# Patient Record
Sex: Male | Born: 1940 | Race: Black or African American | Hispanic: No | Marital: Single | Smoking: Former smoker
Health system: Southern US, Community
[De-identification: ages and names within clinical notes are randomized; demographics above are authoritative.]

## PROBLEM LIST (undated history)

## (undated) ENCOUNTER — Emergency Department (HOSPITAL_COMMUNITY): Admission: EM | Payer: Medicare Other

## (undated) DIAGNOSIS — F209 Schizophrenia, unspecified: Secondary | ICD-10-CM

## (undated) DIAGNOSIS — N400 Enlarged prostate without lower urinary tract symptoms: Secondary | ICD-10-CM

## (undated) DIAGNOSIS — B1920 Unspecified viral hepatitis C without hepatic coma: Secondary | ICD-10-CM

## (undated) DIAGNOSIS — N390 Urinary tract infection, site not specified: Secondary | ICD-10-CM

## (undated) HISTORY — PX: KNEE SURGERY: SHX244

---

## 2005-10-26 ENCOUNTER — Emergency Department (HOSPITAL_COMMUNITY): Admission: EM | Admit: 2005-10-26 | Discharge: 2005-10-26 | Payer: Self-pay | Admitting: Emergency Medicine

## 2006-08-14 ENCOUNTER — Emergency Department (HOSPITAL_COMMUNITY): Admission: EM | Admit: 2006-08-14 | Discharge: 2006-08-14 | Payer: Self-pay | Admitting: *Deleted

## 2006-08-26 ENCOUNTER — Emergency Department (HOSPITAL_COMMUNITY): Admission: EM | Admit: 2006-08-26 | Discharge: 2006-08-26 | Payer: Self-pay | Admitting: Emergency Medicine

## 2010-01-21 ENCOUNTER — Emergency Department (HOSPITAL_COMMUNITY)
Admission: EM | Admit: 2010-01-21 | Discharge: 2010-01-22 | Payer: Self-pay | Source: Home / Self Care | Admitting: Emergency Medicine

## 2010-01-22 ENCOUNTER — Inpatient Hospital Stay (HOSPITAL_COMMUNITY)
Admission: EM | Admit: 2010-01-22 | Discharge: 2010-01-26 | Payer: Self-pay | Source: Home / Self Care | Attending: Internal Medicine | Admitting: Internal Medicine

## 2010-02-01 ENCOUNTER — Observation Stay (HOSPITAL_COMMUNITY)
Admission: EM | Admit: 2010-02-01 | Discharge: 2010-02-06 | Payer: Self-pay | Source: Home / Self Care | Attending: Internal Medicine | Admitting: Internal Medicine

## 2010-02-02 DIAGNOSIS — F200 Paranoid schizophrenia: Secondary | ICD-10-CM

## 2010-02-03 DIAGNOSIS — F39 Unspecified mood [affective] disorder: Secondary | ICD-10-CM

## 2010-02-09 LAB — BASIC METABOLIC PANEL
BUN: 13 mg/dL (ref 6–23)
CO2: 27 mEq/L (ref 19–32)
Calcium: 8.8 mg/dL (ref 8.4–10.5)
Chloride: 109 mEq/L (ref 96–112)
Creatinine, Ser: 0.96 mg/dL (ref 0.4–1.5)
GFR calc Af Amer: >60 mL/min (ref >60)
GFR calc non Af Amer: >60 mL/min (ref >60)
Glucose, Bld: 89 mg/dL (ref 70–99)
Potassium: 4.1 mEq/L (ref 3.5–5.1)
Sodium: 142 mEq/L (ref 135–145)

## 2010-02-09 LAB — CBC
HCT: 37.1 % — ABNORMAL LOW (ref 39.0–52.0)
HCT: 37.6 % — ABNORMAL LOW (ref 39.0–52.0)
Hemoglobin: 12.2 g/dL — ABNORMAL LOW (ref 13.0–17.0)
Hemoglobin: 12.6 g/dL — ABNORMAL LOW (ref 13.0–17.0)
MCH: 30.3 pg (ref 26.0–34.0)
MCH: 30.7 pg (ref 26.0–34.0)
MCHC: 32.9 g/dL (ref 30.0–36.0)
MCHC: 33.5 g/dL (ref 30.0–36.0)
MCV: 92.3 fL (ref 78.0–100.0)
MCV: 91.5 fL (ref 78.0–100.0)
Platelets: 213 10*3/uL (ref 150–400)
Platelets: 216 10*3/uL (ref 150–400)
RBC: 4.02 MIL/uL — ABNORMAL LOW (ref 4.22–5.81)
RBC: 4.11 MIL/uL — ABNORMAL LOW (ref 4.22–5.81)
RDW: 13.7 % (ref 11.5–15.5)
RDW: 13.6 % (ref 11.5–15.5)
WBC: 7.2 10*3/uL (ref 4.0–10.5)
WBC: 6.1 10*3/uL (ref 4.0–10.5)

## 2010-02-09 LAB — DIFFERENTIAL
Basophils Absolute: 0 10*3/uL (ref 0.0–0.1)
Basophils Relative: 1 % (ref 0–1)
Eosinophils Absolute: 0.2 10*3/uL (ref 0.0–0.7)
Eosinophils Relative: 3 % (ref 0–5)
Lymphocytes Relative: 27 % (ref 12–46)
Lymphs Abs: 1.6 10*3/uL (ref 0.7–4.0)
Monocytes Absolute: 0.4 10*3/uL (ref 0.1–1.0)
Monocytes Relative: 7 % (ref 3–12)
Neutro Abs: 3.9 10*3/uL (ref 1.7–7.7)
Neutrophils Relative %: 64 % (ref 43–77)

## 2010-02-09 LAB — URINALYSIS, ROUTINE W REFLEX MICROSCOPIC
Bilirubin Urine: NEGATIVE
Bilirubin Urine: NEGATIVE
Ketones, ur: NEGATIVE mg/dL
Ketones, ur: NEGATIVE mg/dL
Nitrite: NEGATIVE
Nitrite: NEGATIVE
Protein, ur: 30 mg/dL — AB
Protein, ur: NEGATIVE mg/dL
Specific Gravity, Urine: 1.02 (ref 1.005–1.030)
Specific Gravity, Urine: 1.016 (ref 1.005–1.030)
Urine Glucose, Fasting: NEGATIVE mg/dL
Urine Glucose, Fasting: NEGATIVE mg/dL
Urobilinogen, UA: 0.2 mg/dL (ref 0.0–1.0)
Urobilinogen, UA: 0.2 mg/dL (ref 0.0–1.0)
pH: 7 (ref 5.0–8.0)
pH: 7.5 (ref 5.0–8.0)

## 2010-02-09 LAB — URINE MICROSCOPIC-ADD ON

## 2010-02-09 LAB — POCT I-STAT, CHEM 8
BUN: 13 mg/dL (ref 6–23)
Calcium, Ion: 1.18 mmol/L (ref 1.12–1.32)
Chloride: 103 mEq/L (ref 96–112)
Creatinine, Ser: 1.1 mg/dL (ref 0.4–1.5)
Glucose, Bld: 110 mg/dL — ABNORMAL HIGH (ref 70–99)
HCT: 40 % (ref 39.0–52.0)
Hemoglobin: 13.6 g/dL (ref 13.0–17.0)
Potassium: 3.7 mEq/L (ref 3.5–5.1)
Sodium: 140 mEq/L (ref 135–145)
TCO2: 28 mmol/L (ref 0–100)

## 2010-02-09 LAB — COMPREHENSIVE METABOLIC PANEL
ALT: 20 U/L (ref 0–53)
AST: 25 U/L (ref 0–37)
Albumin: 2.7 g/dL — ABNORMAL LOW (ref 3.5–5.2)
Alkaline Phosphatase: 65 U/L (ref 39–117)
BUN: 10 mg/dL (ref 6–23)
CO2: 27 mEq/L (ref 19–32)
Calcium: 8.7 mg/dL (ref 8.4–10.5)
Chloride: 110 mEq/L (ref 96–112)
Creatinine, Ser: 0.91 mg/dL (ref 0.4–1.5)
GFR calc Af Amer: >60 mL/min (ref >60)
GFR calc non Af Amer: >60 mL/min (ref >60)
Glucose, Bld: 95 mg/dL (ref 70–99)
Potassium: 4 mEq/L (ref 3.5–5.1)
Sodium: 142 mEq/L (ref 135–145)
Total Bilirubin: 0.8 mg/dL (ref 0.3–1.2)
Total Protein: 6 g/dL (ref 6.0–8.3)

## 2010-02-09 LAB — FREE PSA
PSA, Free Pct: 15 % — ABNORMAL LOW (ref >25)
PSA, Free: 0.5 ng/mL

## 2010-02-09 LAB — URINE CULTURE
Colony Count: NO GROWTH
Culture  Setup Time: 201201091229
Culture: NO GROWTH

## 2010-02-09 LAB — PSA: PSA: 3.34 ng/mL (ref <=4.00)

## 2010-02-18 ENCOUNTER — Emergency Department (HOSPITAL_COMMUNITY)
Admission: EM | Admit: 2010-02-18 | Discharge: 2010-02-18 | Payer: Self-pay | Source: Home / Self Care | Admitting: Emergency Medicine

## 2010-02-18 LAB — URINALYSIS, ROUTINE W REFLEX MICROSCOPIC
Ketones, ur: NEGATIVE mg/dL
Nitrite: NEGATIVE
Protein, ur: >300 mg/dL — AB
Specific Gravity, Urine: 1.026 (ref 1.005–1.030)
Urine Glucose, Fasting: NEGATIVE mg/dL
Urobilinogen, UA: 1 mg/dL (ref 0.0–1.0)
pH: 6.5 (ref 5.0–8.0)

## 2010-02-18 LAB — URINE MICROSCOPIC-ADD ON

## 2010-02-18 LAB — POCT I-STAT, CHEM 8
BUN: 10 mg/dL (ref 6–23)
Calcium, Ion: 1.21 mmol/L (ref 1.12–1.32)
Chloride: 108 mEq/L (ref 96–112)
Creatinine, Ser: 1.2 mg/dL (ref 0.4–1.5)
Glucose, Bld: 100 mg/dL — ABNORMAL HIGH (ref 70–99)
HCT: 43 % (ref 39.0–52.0)
Hemoglobin: 14.6 g/dL (ref 13.0–17.0)
Potassium: 3.7 mEq/L (ref 3.5–5.1)
Sodium: 145 mEq/L (ref 135–145)
TCO2: 26 mmol/L (ref 0–100)

## 2010-02-19 NOTE — H&P (Addendum)
[name], [name]                ACCOUNT NO.:  [id number]  MEDICAL RECORD NO.:  [id number]          PATIENT TYPE:  INP  LOCATION:  1601                         FACILITY:  [facility]  PHYSICIAN:  Adrian I Eda Paschal, MD      DATE OF BIRTH:  1940/11/12  DATE OF ADMISSION:  02/01/2010 DATE OF DISCHARGE:                             HISTORY & PHYSICAL   PRIMARY CARE PHYSICIAN:  Unassigned.  CHIEF COMPLAINT:  Difficulty urination after removing the Foley catheter since Friday.  HISTORY OF PRESENT ILLNESS:  This is a 70 year old gentleman with a history of schizoaffective disorder with acute psychosis.  He was recently discharged from [facility] on January 2 after diagnosis of schizoaffective disorder with acute psychosis and acute renal insufficiency, felt to be secondary to bladder outlet obstruction, is status post placement of Foley catheter.  The patient was advised to follow up with [facility] for further possible voiding trial or TURP procedure.  The patient was discharged after psychiatric evaluation and stabilization of his psych issue.  The patient yesterday after he felt it would be okay if he removed the Foley and he asked family member to remove it and the Foley catheter removed on Friday and since then he had no bowel movement, had no urine output.  He presented with lower abdominal pain and when Foley catheter is placed, he had almost 3000 cc of urine outpatient.  The patient currently is admitted.  He is under depression and psychological instability, but he denies any suicidal ideation or suicidal attempt.  PAST MEDICAL HISTORY:  Significant for, 1. Schizoaffective disorder with acute psychosis. 2. Schizophrenia. 3. Hepatitis C. 4. History of urinary retention.  PAST SURGICAL HISTORY:  History of right knee surgery and as we mentioned, the patient is really a poor historian and he keeps talking, has significant flight of ideas.  SOCIAL HISTORY:  The  patient is unemployed, as he said he lives with his sister.  He denies any smoking or drinking and denies any drug abuse.  FAMILY HISTORY:  Noncontributory.  ALLERGIES:  No known drug allergies.  MEDICATIONS: 1. Terazosin 1 mg p.o. daily. 2. Haloperidol 5 mg p.o. daily. 3. Depakote 1000 mg p.o. at bedtime. 4. Cipro 500 mg p.o. twice daily.  REVIEW OF SYSTEMS:  As per discussion of the patient, negative for all 10-organ system.  PHYSICAL EXAMINATION:  VITAL SIGNS:  Temperature 98.2, blood pressure 180/97, pulse rate 74, respiratory rate 26, and saturating 99% on room air. GENERAL:  He obviously has some flight of ideas and instability. HEENT:  Normocephalic, atraumatic.  Pupils equal, reactive to light and accommodation.  Oral cavity, poor oral cavity hygiene, but there is no oral thrush.  No lymph node.  No masses.  No thyromegaly. HEART:  S1 and S2.  No gallop or rales. LUNGS:  Normal vesicular breathing with equal air entry. ABDOMEN:  Soft, nontender.  Bowel sound positive.  Foley catheter in place with more than 1000 cc of clear urine. EXTREMITIES:  No cyanosis, no clubbing or edema. SKIN:  No rashes. NEUROLOGIC:  Nonfocal neurological deficit.  Urine microscopy, RBCs too numerous  to count.  Urinalysis, large blood and negative leukocyte esterase.  Hemoglobin 13.6, hematocrit 40. Sodium 140, potassium 3.7, chloride 103, glucose 110, BUN 13, and creatinine 1.1.  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  We will continue with placing the Foley catheter and hydrating.  We will get prostate-specific antigen.  We will ask both Urology and Psychiatry to evaluate.  The patient may need TURP and needs further psych  stabilization.  We will continue with Cipro p.o. for covering against urinary tract infection.  We will repeat urinalysis and culture.  He has microscopic hematuria, which we felt could be secondary to removing the Foley where this underlying infection.  We may need to have cystoscopy and TURP procedure.  We will continue with home medication for psych stabilization.  Deep venous thrombosis and gastrointestinal prophylaxis.  Further recommendation as hospital course progresses.     Adrian Bosie Helper, MD     HIE/MEDQ  D:  02/01/2010  T:  02/02/2010  Job:  [id number]  Electronically Signed by Ebony Cargo MD on 02/19/2010 12:46:05 PM

## 2010-02-20 LAB — URINE CULTURE
Colony Count: >=100000
Culture  Setup Time: 201201251325

## 2010-02-25 NOTE — H&P (Signed)
NAME:  [name], [name] NO.:  [id number]  MEDICAL RECORD NO.:  [id number]          PATIENT TYPE:  EMS  LOCATION:  MAJO                         FACILITY:  [facility]  PHYSICIAN:  Massie Maroon, MD        DATE OF BIRTH:  [birth year]  DATE OF ADMISSION:  01/22/2010 DATE OF DISCHARGE:                             HISTORY & PHYSICAL   CHIEF COMPLAINT:  Has had difficulty urinating.  HISTORY OF PRESENT ILLNESS:  A 70 year old male complains of urinary retention.  He was apparently seen yesterday in the ED and a Foley was placed and he has a leg bag.  Patient thinks that it is causing him pain.  He admits slight dysuria, but denies any hematuria, flank pain, fever, chills.  Patient is living at [facility] of the [employer] and is unemployed.  He is essentially homeless, though sometimes he lives with his family.  ED physician thought that the patient had poor followup and should be admitted for urinary retention and evaluation and then transferred to Psychiatry for further evaluation of the schizophrenia.  PAST MEDICAL HISTORY: 1. Schizophrenia. 2. Hepatitis C. 3. Urinary retention.  PAST SURGICAL HISTORY:  Right knee or leg surgery?  The patient is a poor historian and cannot recall what exactly the surgery was.  SOCIAL HISTORY:  The patient is unemployed.  He lives at [employer] presently of the [employer].  He denies smoking or drinking at the present time and denies any drug use.  FAMILY HISTORY:  Mother is deceased, but he is not sure from what his father died at age 39, if heart attack and was a smoker.  There is no family history of schizophrenia per the patient or any other psychiatric issues.  ALLERGIES:  No known drug allergies.  MEDICATIONS:  None.  REVIEW OF SYSTEMS:  Negative for all 10 organ systems except for pertinent positive as stated above.  PHYSICAL EXAMINATION:  VITAL SIGNS: Temperature 98.9, pulse 99, blood pressure 183/94, pulse  ox is 100% on room air.  HEENT: Anicteric.  NECK: No JVD, no bruit, no thyromegaly, no adenopathy.  HEART: Regular rate and rhythm.  S1, S2.  No murmurs, gallops, or rubs.  LUNGS: Clear to auscultation bilaterally.  ABDOMEN: Soft, nontender, nondistended. Positive bowel sounds.  EXTREMITIES: No cyanosis, clubbing, or edema. SKIN: No rashes.  LYMPH NODES: No adenopathy.  NEURO: Nonfocal, onychomycosis.  LABORATORY DATA:  Urine drug screen negative.  Urinalysis, WBC 7-10, RBCs too numerous to count.  Alcohol level less than 5.  Sodium 136, potassium 3.8, BUN 15, creatinine 1.42, AST 29, ALT 17, alk phos 74, total bilirubin 1.5.  WBC 12.4, hemoglobin 14.0, platelet count 187.  CT scan of the abdomen and pelvis shows distended urinary bladder with multiple bladder calculi consistent with chronic bladder outlet obstruction, likely by an enlarged prostate gland.  There is mild bilateral hydronephrosis and hydroureter likely due to distended bladder, cholelithiasis, and incidental right ileus psoas lipoma.  ASSESSMENT/PLAN:1. Urinary retention:  Patient will be started on terazosin 1 mg p.o.     at bedtime.  Check a PSA. 2.  Consider urology consult in the morning. 3. Urinary tract infection:  Ceftriaxone.  Actually use Cipro 400 mg     IV b.i.d. 4. Hypertension, uncontrolled.  Carvedilol 3.125 mg p.o. b.i.d. 5. Schizophrenia:  Psychiatry consult in the a.m. 6. DVT prophylaxis, SCDs.     Massie Maroon, MD     JYK/MEDQ  D:  01/23/2010  T:  01/23/2010  Job:  [id number]  Electronically Signed by Pearson Grippe MD on 02/25/2010 11:38:43 AM

## 2010-04-06 LAB — RAPID URINE DRUG SCREEN, HOSP PERFORMED
Amphetamines: NOT DETECTED
Barbiturates: NOT DETECTED
Benzodiazepines: NOT DETECTED
Cocaine: NOT DETECTED
Opiates: NOT DETECTED
Tetrahydrocannabinol: NOT DETECTED

## 2010-04-06 LAB — CBC
HCT: 36.5 % — ABNORMAL LOW (ref 39.0–52.0)
HCT: 39.6 % (ref 39.0–52.0)
HCT: 41.3 % (ref 39.0–52.0)
Hemoglobin: 12.6 g/dL — ABNORMAL LOW (ref 13.0–17.0)
Hemoglobin: 14 g/dL (ref 13.0–17.0)
Hemoglobin: 14.8 g/dL (ref 13.0–17.0)
MCH: 30.3 pg (ref 26.0–34.0)
MCH: 30.6 pg (ref 26.0–34.0)
MCH: 31 pg (ref 26.0–34.0)
MCHC: 34.5 g/dL (ref 30.0–36.0)
MCHC: 35.4 g/dL (ref 30.0–36.0)
MCHC: 35.8 g/dL (ref 30.0–36.0)
MCV: 86.5 fL (ref 78.0–100.0)
MCV: 86.6 fL (ref 78.0–100.0)
Platelets: 187 10*3/uL (ref 150–400)
Platelets: 195 10*3/uL (ref 150–400)
RBC: 4.16 MIL/uL — ABNORMAL LOW (ref 4.22–5.81)
RBC: 4.58 MIL/uL (ref 4.22–5.81)
RBC: 4.77 MIL/uL (ref 4.22–5.81)
RDW: 12.4 % (ref 11.5–15.5)
RDW: 12.7 % (ref 11.5–15.5)
WBC: 12.4 10*3/uL — ABNORMAL HIGH (ref 4.0–10.5)
WBC: 8 10*3/uL (ref 4.0–10.5)

## 2010-04-06 LAB — URINALYSIS, ROUTINE W REFLEX MICROSCOPIC
Bilirubin Urine: NEGATIVE
Bilirubin Urine: NEGATIVE
Glucose, UA: NEGATIVE mg/dL
Glucose, UA: NEGATIVE mg/dL
Glucose, UA: NEGATIVE mg/dL
Ketones, ur: NEGATIVE mg/dL
Ketones, ur: NEGATIVE mg/dL
Ketones, ur: NEGATIVE mg/dL
Leukocytes, UA: NEGATIVE
Nitrite: NEGATIVE
Nitrite: NEGATIVE
Protein, ur: 100 mg/dL — AB
Protein, ur: 30 mg/dL — AB
Protein, ur: 30 mg/dL — AB
Specific Gravity, Urine: 1.009 (ref 1.005–1.030)
Specific Gravity, Urine: 1.015 (ref 1.005–1.030)
Urobilinogen, UA: 0.2 mg/dL (ref 0.0–1.0)
Urobilinogen, UA: 0.2 mg/dL (ref 0.0–1.0)
pH: 5.5 (ref 5.0–8.0)
pH: 5.5 (ref 5.0–8.0)
pH: 5.5 (ref 5.0–8.0)

## 2010-04-06 LAB — BASIC METABOLIC PANEL
CO2: 21 mEq/L (ref 19–32)
CO2: 26 mEq/L (ref 19–32)
Calcium: 8.7 mg/dL (ref 8.4–10.5)
Chloride: 110 mEq/L (ref 96–112)
Chloride: 115 mEq/L — ABNORMAL HIGH (ref 96–112)
GFR calc Af Amer: 42 mL/min — ABNORMAL LOW (ref >60)
GFR calc Af Amer: >60 mL/min (ref >60)
Glucose, Bld: 105 mg/dL — ABNORMAL HIGH (ref 70–99)
Glucose, Bld: 99 mg/dL (ref 70–99)
Potassium: 4 mEq/L (ref 3.5–5.1)
Sodium: 141 mEq/L (ref 135–145)
Sodium: 142 mEq/L (ref 135–145)

## 2010-04-06 LAB — DIFFERENTIAL
Basophils Absolute: 0 10*3/uL (ref 0.0–0.1)
Basophils Absolute: 0 K/uL (ref 0.0–0.1)
Basophils Relative: 0 % (ref 0–1)
Basophils Relative: 0 % (ref 0–1)
Eosinophils Absolute: 0 10*3/uL (ref 0.0–0.7)
Eosinophils Absolute: 0 K/uL (ref 0.0–0.7)
Eosinophils Relative: 0 % (ref 0–5)
Eosinophils Relative: 0 % (ref 0–5)
Lymphocytes Relative: 11 % — ABNORMAL LOW (ref 12–46)
Lymphocytes Relative: 7 % — ABNORMAL LOW (ref 12–46)
Lymphs Abs: 0.9 10*3/uL (ref 0.7–4.0)
Lymphs Abs: 0.9 10*3/uL (ref 0.7–4.0)
Monocytes Absolute: 0.4 10*3/uL (ref 0.1–1.0)
Monocytes Absolute: 1 10*3/uL (ref 0.1–1.0)
Monocytes Relative: 5 % (ref 3–12)
Monocytes Relative: 8 % (ref 3–12)
Neutro Abs: 10.5 10*3/uL — ABNORMAL HIGH (ref 1.7–7.7)
Neutro Abs: 6.7 10*3/uL (ref 1.7–7.7)
Neutrophils Relative %: 84 % — ABNORMAL HIGH (ref 43–77)
Neutrophils Relative %: 84 % — ABNORMAL HIGH (ref 43–77)

## 2010-04-06 LAB — COMPREHENSIVE METABOLIC PANEL WITH GFR
ALT: 17 U/L (ref 0–53)
ALT: 20 U/L (ref 0–53)
AST: 29 U/L (ref 0–37)
AST: 31 U/L (ref 0–37)
Albumin: 3.8 g/dL (ref 3.5–5.2)
Alkaline Phosphatase: 61 U/L (ref 39–117)
BUN: 13 mg/dL (ref 6–23)
CO2: 21 meq/L (ref 19–32)
CO2: 21 meq/L (ref 19–32)
Calcium: 9.4 mg/dL (ref 8.4–10.5)
Chloride: 103 meq/L (ref 96–112)
Chloride: 108 meq/L (ref 96–112)
Creatinine, Ser: 1.1 mg/dL (ref 0.4–1.5)
Creatinine, Ser: 1.42 mg/dL (ref 0.4–1.5)
GFR calc Af Amer: >60 mL/min (ref >60)
GFR calc non Af Amer: 50 mL/min — ABNORMAL LOW (ref >60)
GFR calc non Af Amer: >60 mL/min
Glucose, Bld: 109 mg/dL — ABNORMAL HIGH (ref 70–99)
Potassium: 3.7 meq/L (ref 3.5–5.1)
Sodium: 134 meq/L — ABNORMAL LOW (ref 135–145)
Sodium: 136 meq/L (ref 135–145)
Total Bilirubin: 1.5 mg/dL — ABNORMAL HIGH (ref 0.3–1.2)
Total Bilirubin: 2.3 mg/dL — ABNORMAL HIGH (ref 0.3–1.2)
Total Protein: 7.2 g/dL (ref 6.0–8.3)

## 2010-04-06 LAB — URINE MICROSCOPIC-ADD ON

## 2010-04-06 LAB — URINE CULTURE
Colony Count: NO GROWTH
Culture  Setup Time: 201112292230
Culture  Setup Time: 201112311804
Culture: NO GROWTH

## 2010-04-06 LAB — COMPREHENSIVE METABOLIC PANEL
Albumin: 3.8 g/dL (ref 3.5–5.2)
Alkaline Phosphatase: 74 U/L (ref 39–117)
BUN: 15 mg/dL (ref 6–23)
Calcium: 9.2 mg/dL (ref 8.4–10.5)
Glucose, Bld: 144 mg/dL — ABNORMAL HIGH (ref 70–99)
Potassium: 3.8 mEq/L (ref 3.5–5.1)
Total Protein: 6.6 g/dL (ref 6.0–8.3)

## 2010-04-06 LAB — LIPASE, BLOOD: Lipase: 16 U/L (ref 11–59)

## 2010-04-06 LAB — ETHANOL
Alcohol, Ethyl (B): <5 mg/dL (ref 0–10)
Alcohol, Ethyl (B): <5 mg/dL (ref 0–10)

## 2010-04-06 LAB — PSA: PSA: 10.15 ng/mL — ABNORMAL HIGH (ref <=4.00)

## 2010-06-10 ENCOUNTER — Emergency Department (HOSPITAL_COMMUNITY)
Admission: EM | Admit: 2010-06-10 | Discharge: 2010-06-11 | Disposition: A | Discharge status: Home / Self Care | Payer: Medicare Other | Attending: Emergency Medicine | Admitting: Emergency Medicine

## 2010-06-10 DIAGNOSIS — N400 Enlarged prostate without lower urinary tract symptoms: Secondary | ICD-10-CM | POA: Insufficient documentation

## 2010-06-10 DIAGNOSIS — Z8659 Personal history of other mental and behavioral disorders: Secondary | ICD-10-CM | POA: Insufficient documentation

## 2010-06-10 DIAGNOSIS — R300 Dysuria: Secondary | ICD-10-CM | POA: Insufficient documentation

## 2010-06-10 DIAGNOSIS — R350 Frequency of micturition: Secondary | ICD-10-CM | POA: Insufficient documentation

## 2010-06-10 DIAGNOSIS — N390 Urinary tract infection, site not specified: Secondary | ICD-10-CM | POA: Insufficient documentation

## 2010-06-10 DIAGNOSIS — R3915 Urgency of urination: Secondary | ICD-10-CM | POA: Insufficient documentation

## 2010-06-10 DIAGNOSIS — R197 Diarrhea, unspecified: Secondary | ICD-10-CM | POA: Insufficient documentation

## 2010-06-10 DIAGNOSIS — Z8619 Personal history of other infectious and parasitic diseases: Secondary | ICD-10-CM | POA: Insufficient documentation

## 2010-06-10 DIAGNOSIS — N419 Inflammatory disease of prostate, unspecified: Secondary | ICD-10-CM | POA: Insufficient documentation

## 2010-06-10 LAB — URINALYSIS, ROUTINE W REFLEX MICROSCOPIC
Bilirubin Urine: NEGATIVE
Glucose, UA: NEGATIVE mg/dL
Specific Gravity, Urine: 1.024 (ref 1.005–1.030)

## 2010-06-10 LAB — URINE MICROSCOPIC-ADD ON

## 2010-11-09 LAB — URINALYSIS, ROUTINE W REFLEX MICROSCOPIC
Ketones, ur: NEGATIVE
Leukocytes, UA: NEGATIVE
Nitrite: NEGATIVE
Protein, ur: 30 — AB
Urobilinogen, UA: 1

## 2010-11-09 LAB — GC/CHLAMYDIA PROBE AMP, GENITAL: GC Probe Amp, Genital: NEGATIVE

## 2011-06-09 ENCOUNTER — Encounter (HOSPITAL_COMMUNITY): Payer: Self-pay | Admitting: *Deleted

## 2011-06-09 ENCOUNTER — Emergency Department (HOSPITAL_COMMUNITY)
Admission: EM | Admit: 2011-06-09 | Discharge: 2011-06-11 | Disposition: A | Discharge status: Other Acute Inpatient Hospital | Payer: Medicare Other | Source: Home / Self Care | Attending: Emergency Medicine | Admitting: Emergency Medicine

## 2011-06-09 DIAGNOSIS — N390 Urinary tract infection, site not specified: Secondary | ICD-10-CM

## 2011-06-09 DIAGNOSIS — F209 Schizophrenia, unspecified: Secondary | ICD-10-CM | POA: Insufficient documentation

## 2011-06-09 DIAGNOSIS — R300 Dysuria: Secondary | ICD-10-CM | POA: Insufficient documentation

## 2011-06-09 DIAGNOSIS — F309 Manic episode, unspecified: Secondary | ICD-10-CM | POA: Insufficient documentation

## 2011-06-09 DIAGNOSIS — R45851 Suicidal ideations: Secondary | ICD-10-CM | POA: Insufficient documentation

## 2011-06-09 DIAGNOSIS — B1920 Unspecified viral hepatitis C without hepatic coma: Secondary | ICD-10-CM | POA: Insufficient documentation

## 2011-06-09 DIAGNOSIS — F29 Unspecified psychosis not due to a substance or known physiological condition: Secondary | ICD-10-CM | POA: Insufficient documentation

## 2011-06-09 HISTORY — DX: Unspecified viral hepatitis C without hepatic coma: B19.20

## 2011-06-09 HISTORY — DX: Benign prostatic hyperplasia without lower urinary tract symptoms: N40.0

## 2011-06-09 HISTORY — DX: Schizophrenia, unspecified: F20.9

## 2011-06-09 LAB — CBC
MCV: 88.2 fL (ref 78.0–100.0)
Platelets: 218 10*3/uL (ref 150–400)
RBC: 5.01 MIL/uL (ref 4.22–5.81)
WBC: 7 10*3/uL (ref 4.0–10.5)

## 2011-06-09 LAB — URINALYSIS, ROUTINE W REFLEX MICROSCOPIC
Glucose, UA: NEGATIVE mg/dL
Protein, ur: NEGATIVE mg/dL
Specific Gravity, Urine: 1.022 (ref 1.005–1.030)
pH: 8 (ref 5.0–8.0)

## 2011-06-09 LAB — BASIC METABOLIC PANEL
CO2: 24 mEq/L (ref 19–32)
Calcium: 9.7 mg/dL (ref 8.4–10.5)
GFR calc non Af Amer: 87 mL/min — ABNORMAL LOW (ref >90)
Sodium: 140 mEq/L (ref 135–145)

## 2011-06-09 LAB — URINE MICROSCOPIC-ADD ON

## 2011-06-09 LAB — RAPID URINE DRUG SCREEN, HOSP PERFORMED
Opiates: NOT DETECTED
Tetrahydrocannabinol: NOT DETECTED

## 2011-06-09 MED ORDER — IBUPROFEN 200 MG PO TABS: 400.0000 mg | ORAL_TABLET | Freq: Three times a day (TID) | ORAL | Status: DC | PRN

## 2011-06-09 MED ORDER — ALUM & MAG HYDROXIDE-SIMETH 200-200-20 MG/5ML PO SUSP: 30.0000 mL | ORAL | Status: DC | PRN

## 2011-06-09 MED ORDER — LORAZEPAM 1 MG PO TABS
2.0000 mg | ORAL_TABLET | Freq: Once | ORAL | Status: AC
  Administered 2011-06-09: 2 mg via ORAL
  Filled 2011-06-09: qty 2

## 2011-06-09 MED ORDER — ZOLPIDEM TARTRATE 5 MG PO TABS: 5.0000 mg | ORAL_TABLET | Freq: Every evening | ORAL | Status: DC | PRN

## 2011-06-09 MED ORDER — ONDANSETRON HCL 8 MG PO TABS: 4.0000 mg | ORAL_TABLET | Freq: Three times a day (TID) | ORAL | Status: DC | PRN

## 2011-06-09 MED ORDER — ACETAMINOPHEN 325 MG PO TABS: 650.0000 mg | ORAL_TABLET | ORAL | Status: DC | PRN

## 2011-06-09 MED ORDER — LORAZEPAM 1 MG PO TABS: 1.0000 mg | ORAL_TABLET | Freq: Three times a day (TID) | ORAL | Status: DC | PRN

## 2011-06-09 MED ORDER — NICOTINE 21 MG/24HR TD PT24: 21.0000 mg | MEDICATED_PATCH | Freq: Every day | TRANSDERMAL | Status: DC | PRN

## 2011-06-09 MED ORDER — CIPROFLOXACIN HCL 500 MG PO TABS
500.0000 mg | ORAL_TABLET | Freq: Two times a day (BID) | ORAL | Status: DC
  Administered 2011-06-09 – 2011-06-11 (×3): 500 mg via ORAL
  Filled 2011-06-09 (×4): qty 1

## 2011-06-09 NOTE — ED Notes (Signed)
Telepsych called to say Pt. Needs to be admitted and orders for him will be faxed.

## 2011-06-09 NOTE — ED Notes (Signed)
Dr. Fonnie Jarvis writing IVC papers. Per [facility] pt is in need of inpatient psychiatric services and pt is refusing to stay. Security at bedside and sitter at bedside.

## 2011-06-09 NOTE — ED Notes (Signed)
Received faxed from tele psych given to EDP.

## 2011-06-09 NOTE — ED Notes (Signed)
Pt changed, wanded and belongings locked in locker 9. One pair of boots, sweatshirt and jeans. 23.69$ and2 credit cards

## 2011-06-09 NOTE — ED Notes (Signed)
Telepsych initiated. Paperwork faxed. Initial call made.

## 2011-06-09 NOTE — ED Notes (Signed)
Pt refuses to change, states "I want a legal representative, I want to go before the magistrate"

## 2011-06-09 NOTE — ED Notes (Signed)
Tele psych called and now speaking with patient via video

## 2011-06-09 NOTE — ED Notes (Signed)
Security at bedside to change patient

## 2011-06-09 NOTE — ED Provider Notes (Signed)
History     CSN: [id number]  Arrival date & time 06/09/11  4098   First MD Initiated Contact with Patient 06/09/11 1053      Chief Complaint  Patient presents with  . painful urination     The history is provided by the patient. History Limited By: Hx mental illness.  Pt was seen at 1035.  Per pt, c/o "burning in my penis when I urinate" for the past year.  States "it only hurts when I'm in [place]."  Reports his symptoms improved when he "drank energy drinks and moved to [place]."          Past Medical History  Diagnosis Date  . Schizophrenia   . Hepatitis C   . Benign hypertrophy of prostate     Past Surgical History  Procedure Date  . Knee surgery     right     History  Substance Use Topics  . Smoking status: Former Games developer  . Smokeless tobacco: Not on file  . Alcohol Use: No    Review of Systems  Unable to perform ROS: Psychiatric disorder    Allergies  Review of patient's allergies indicates no known allergies.  Home Medications  No current outpatient prescriptions on file.  BP 150/90  Pulse 93  Temp(Src) 98.1 F (36.7 C) (Oral)  Resp 20  SpO2 98%  Physical Exam 1040: Physical examination:  Nursing notes reviewed; Vital signs and O2 SAT reviewed;  Constitutional: Well developed, Well nourished, Well hydrated, In no acute distress; Head:  Normocephalic, atraumatic; Eyes: EOMI, No scleral icterus; ENMT: No hoarse voice, no stridor. Mucous membranes moist; Neck: Supple, Full range of motion, No lymphadenopathy; Cardiovascular: Regular rate and rhythm; Respiratory: Breath sounds clear bilaterally, resps easy. Normal respiratory effort/excursion; Chest: Movement normal; Extremities: No deformity, No edema; Neuro: Awake, alert, speech clear, no facial droop, gait steady, climbs on and off stretcher without difficulty.; Skin: Color normal, Warm, Dry; Psych:  Easily agitated, pressured speech, flight of ideas, tangential.   ED Course  Procedures    1045:  After pt relates above, he then begins to talk about "Shella Maxim" "they killed him" and "some people have the ability to themselves better than the other people" (pt stands up and bends forward from the waist touching his toes and stays in this position when he states this, then stands up and says, "right?" and "ok").  Pt easily agitated.  Offered ativan PO; pt will "think about it."   EPIC chart review does show pt was admitted on medical service in 12/2009 and 01/2010 for urinary retention but also had flight of ideas/psychosis/mania; psych MD consults at that time gave meds recommendations with improvement in mental health symptoms and pt was discharged from the hospital after improvement in his urinary symptoms.  No apparent SI or HI today but appears manic, psychotic, easily agitated, having flight of ideas and does not appear to be taking his meds, may need psych consult/admit for stabilization.   1100:  T/C from Uro Dr. Sherron Monday:  States he was informed pt was in the ED, knows pt well, requests to tx with abx if has UTI, states he does NOT need to see the pt in consult for a simple UTI before/if pt needs to be admitted to psych.    1350:  T/C to [facility], they will eval in ED.  Holding orders written, including PO abx for +UTI (UC pending).  Pt moved to yellow.   MDM  MDM Reviewed: previous chart, nursing note  and vitals Interpretation: labs   Results for orders placed during the hospital encounter of 06/09/11  URINALYSIS, ROUTINE W REFLEX MICROSCOPIC      Component Value Range   Color, Urine YELLOW  YELLOW    APPearance CLEAR  CLEAR    Specific Gravity, Urine 1.022  1.005 - 1.030    pH 8.0  5.0 - 8.0    Glucose, UA NEGATIVE  NEGATIVE (mg/dL)   Hgb urine dipstick NEGATIVE  NEGATIVE    Bilirubin Urine NEGATIVE  NEGATIVE    Ketones, ur NEGATIVE  NEGATIVE (mg/dL)   Protein, ur NEGATIVE  NEGATIVE (mg/dL)   Urobilinogen, UA 1.0  0.0 - 1.0 (mg/dL)   Nitrite NEGATIVE  NEGATIVE     Leukocytes, UA SMALL (*) NEGATIVE   BASIC METABOLIC PANEL      Component Value Range   Sodium 140  135 - 145 (mEq/L)   Potassium 4.7  3.5 - 5.1 (mEq/L)   Chloride 108  96 - 112 (mEq/L)   CO2 24  19 - 32 (mEq/L)   Glucose, Bld 102 (*) 70 - 99 (mg/dL)   BUN 17  6 - 23 (mg/dL)   Creatinine, Ser 4.09  0.50 - 1.35 (mg/dL)   Calcium 9.7  8.4 - 81.1 (mg/dL)   GFR calc non Af Amer 87 (*) >90 (mL/min)   GFR calc Af Amer >90  >90 (mL/min)  CBC      Component Value Range   WBC 7.0  4.0 - 10.5 (K/uL)   RBC 5.01  4.22 - 5.81 (MIL/uL)   Hemoglobin 15.5  13.0 - 17.0 (g/dL)   HCT 91.4  78.2 - 95.6 (%)   MCV 88.2  78.0 - 100.0 (fL)   MCH 30.9  26.0 - 34.0 (pg)   MCHC 35.1  30.0 - 36.0 (g/dL)   RDW 21.3  08.6 - 57.8 (%)   Platelets 218  150 - 400 (K/uL)  URINE RAPID DRUG SCREEN (HOSP PERFORMED)      Component Value Range   Opiates NONE DETECTED  NONE DETECTED    Cocaine NONE DETECTED  NONE DETECTED    Benzodiazepines NONE DETECTED  NONE DETECTED    Amphetamines NONE DETECTED  NONE DETECTED    Tetrahydrocannabinol NONE DETECTED  NONE DETECTED    Barbiturates NONE DETECTED  NONE DETECTED   ETHANOL      Component Value Range   Alcohol, Ethyl (B) <11  0 - 11 (mg/dL)  URINE MICROSCOPIC-ADD ON      Component Value Range   Squamous Epithelial / LPF RARE  RARE    WBC, UA 11-20  <3 (WBC/hpf)   RBC / HPF 0-2  <3 (RBC/hpf)   Bacteria, UA RARE  RARE    Urine-Other MUCOUS PRESENT                Laray Anger, DO 06/10/11 [phone]

## 2011-06-09 NOTE — ED Notes (Signed)
Pt refuses to change into blue scrub, states "I want my Discharge papers and I want to leave. People can get their sex organs out and they can't figure out what medicine I need for my urine infection."

## 2011-06-09 NOTE — BH Assessment (Signed)
Assessment Note  [name] is an 71 y.o. male that presented to [facility] ED reporting pain upon urination which is alleviated only when drinking energy drinks or leaving [place]. Pt refused to put on a gown stating that it was stained by "[name]" and appeared to be religiously preoccupied. Pt denies previous psychiatric treatment but voices being hospitalized at the [name] eight months ago for a UTI. Pt denies SI or HI or any psychotic features though he is clearly not fully oriented. Pt would benefit from a Telepsych consult to determine best course of treatment. Relayed information to [name] who will place the order.   Pt evaluated by telepsych and inpatient treatment recommended.   Axis I: Schizophrenia, Paranoid Type  Axis II: No diagnosis  Axis III:  Past Medical History   Diagnosis  Date   .  Schizophrenia    .  Hepatitis C    .  Benign hypertrophy of prostate     Axis IV: other psychosocial or environmental problems, problems related to social environment and problems with access to health care services  Axis V: 21-30 behavior considerably influenced by delusions or hallucinations OR serious impairment in judgment, communication OR inability to function in almost all areas    Past Medical History:  Past Medical History  Diagnosis Date  . Schizophrenia   . Hepatitis C   . Benign hypertrophy of prostate     Past Surgical History  Procedure Date  . Knee surgery     right    Family History: History reviewed. No pertinent family history.  Social History:  reports that he has quit smoking. He does not have any smokeless tobacco history on file. He reports that he does not drink alcohol. His drug history not on file.  Additional Social History:    Allergies: No Known Allergies  Home Medications:  (Not in a hospital admission)  OB/GYN Status:  No LMP for male patient.  General Assessment Data Location of Assessment: [facility] Living Arrangements: Alone Can pt  return to current living arrangement?: Yes Admission Status: Involuntary Is patient capable of signing voluntary admission?: No Transfer from: Acute Hospital Referral Source: MD  Education Status Is patient currently in school?: No  Risk to self Suicidal Ideation: No Suicidal Intent: No Is patient at risk for suicide?: No Suicidal Plan?: No Access to Means: No What has been your use of drugs/alcohol within the last 12 months?: none per drug screen Previous Attempts/Gestures: No How many times?: 0  Other Self Harm Risks: 0 Triggers for Past Attempts: Unpredictable Intentional Self Injurious Behavior: None Family Suicide History: No Recent stressful life event(s): Recent negative physical changes Persecutory voices/beliefs?: Yes Depression: No Substance abuse history and/or treatment for substance abuse?: No Suicide prevention information given to non-admitted patients: Not applicable  Risk to Others Homicidal Ideation: No Thoughts of Harm to Others: No Current Homicidal Intent: No Current Homicidal Plan: No Access to Homicidal Means: No Identified Victim: n/a History of harm to others?: No Assessment of Violence: None Noted Violent Behavior Description: none noted Does patient have access to weapons?: No Criminal Charges Pending?: No Does patient have a court date: No  Psychosis Hallucinations: Visual Delusions: Grandiose;Somatic  Mental Status Report Appear/Hygiene: Body odor;Disheveled;Layered clothes;Poor hygiene Eye Contact: Poor Motor Activity: Gestures;Freedom of movement Speech: Incoherent;Pressured;Soft;Tangential Level of Consciousness: Quiet/awake Mood: Suspicious;Ambivalent;Empty;Helpless Affect: Apathetic;Apprehensive;Inconsistent with thought content Anxiety Level: Moderate Thought Processes: Irrelevant;Circumstantial;Tangential;Flight of Ideas Judgement: Impaired Orientation: Person;Place Obsessive Compulsive Thoughts/Behaviors:  Severe  Cognitive Functioning Concentration: Decreased  Memory: Recent Impaired;Remote Impaired IQ: Average Insight: Poor Impulse Control: Poor Appetite: Good Weight Loss: 0  Weight Gain: 0  Sleep: Decreased Total Hours of Sleep:  (says he is sleeping "okay" but will not give a numeric numbe) Vegetative Symptoms: None  Prior Inpatient Therapy Prior Inpatient Therapy: Yes Prior Therapy Dates: unknown Prior Therapy Facilty/Provider(s): reports hospitalized "at the [facility]"  Reason for Treatment: reports being affected by a UTI  Prior Outpatient Therapy Prior Outpatient Therapy:  (unknown-pt denies) Prior Therapy Dates: unknown Prior Therapy Facilty/Provider(s): unknown Reason for Treatment: unknown            Values / Beliefs Cultural Requests During Hospitalization: None Spiritual Requests During Hospitalization: None        Additional Information 1:1 In Past 12 Months?: No CIRT Risk: No Elopement Risk: No Does patient have medical clearance?: Yes     Disposition:  Disposition Disposition of Patient: Inpatient treatment program Type of inpatient treatment program: Adult Patient referred to: Other (Comment) (Telepsych rec inpatient treatment)  On Site Evaluation by:   Reviewed with Physician:     Steward Ros 06/09/2011 8:38 PM

## 2011-06-09 NOTE — ED Notes (Signed)
Report given to yellow RN, will update pt on plan of care

## 2011-06-09 NOTE — BH Assessment (Signed)
Assessment Note   [name] is an 71 y.o. male that presented to [name] ED reporting pain upon urination which is alleviated only when drinking energy drinks or leaving [name].  Pt refused to put on a gown stating that it was stained by "[name]" and appeared to be religiously preoccupied.  Pt denies previous psychiatric treatment but voices being hospitalized at the [name] eight months ago for a UTI.  Pt denies SI or HI or any psychotic features though he is clearly not fully oriented.  Pt would benefit from a Telepsych consult to determine best course of treatment.  Relayed information to Dr. Clarene [name] who will place the order.  Disposition to be determined by [name] consultation.  Axis I: Schizophrenia, Paranoid Type Axis II: No diagnosis Axis III:  Past Medical History  Diagnosis Date  . Schizophrenia   . Hepatitis C   . Benign hypertrophy of prostate    Axis IV: other psychosocial or environmental problems, problems related to social environment and problems with access to health care services Axis V: 21-30 behavior considerably influenced by delusions or hallucinations OR serious impairment in judgment, communication OR inability to function in almost all areas  Past Medical History:  Past Medical History  Diagnosis Date  . Schizophrenia   . Hepatitis C   . Benign hypertrophy of prostate     Past Surgical History  Procedure Date  . Knee surgery     right    Family History: History reviewed. No pertinent family history.  Social History:  reports that he has quit smoking. He does not have any smokeless tobacco history on file. He reports that he does not drink alcohol. His drug history not on file.  Additional Social History:    Allergies: No Known Allergies  Home Medications:  (Not in a hospital admission)  OB/GYN Status:  No LMP for male patient.  General Assessment Data Location of Assessment: [name] ED Living Arrangements: Alone Can pt return to current living  arrangement?: Yes Admission Status: Other (Comment) Is patient capable of signing voluntary admission?: No Transfer from: Acute Hospital Referral Source: MD  Education Status Is patient currently in school?: No  Risk to self Suicidal Ideation: No Suicidal Intent: No Is patient at risk for suicide?: No Suicidal Plan?: No Access to Means: No What has been your use of drugs/alcohol within the last 12 months?: none per drug screen Previous Attempts/Gestures: No How many times?: 0  Other Self Harm Risks: 0 Triggers for Past Attempts: Unpredictable Intentional Self Injurious Behavior: None Family Suicide History: No Recent stressful life event(s): Recent negative physical changes Persecutory voices/beliefs?: Yes Depression: No Substance abuse history and/or treatment for substance abuse?: No Suicide prevention information given to non-admitted patients: Not applicable  Risk to Others Homicidal Ideation: No Thoughts of Harm to Others: No Current Homicidal Intent: No Current Homicidal Plan: No Access to Homicidal Means: No Identified Victim: n/a History of harm to others?: No Assessment of Violence: None Noted Violent Behavior Description: none noted Does patient have access to weapons?: No Criminal Charges Pending?: No Does patient have a court date: No  Psychosis Hallucinations: Visual Delusions: Grandiose;Somatic  Mental Status Report Appear/Hygiene: Body odor;Disheveled;Layered clothes;Poor hygiene Eye Contact: Poor Motor Activity: Gestures;Freedom of movement Speech: Incoherent;Pressured;Soft;Tangential Level of Consciousness: Quiet/awake Mood: Suspicious;Ambivalent;Empty;Helpless Affect: Apathetic;Apprehensive;Inconsistent with thought content Anxiety Level: Moderate Thought Processes: Irrelevant;Circumstantial;Tangential;Flight of Ideas Judgement: Impaired Orientation: Person;Place Obsessive Compulsive Thoughts/Behaviors: Severe  Cognitive  Functioning Concentration: Decreased Memory: Recent Impaired;Remote Impaired IQ: Average Insight: Poor Impulse Control:  Poor Appetite: Good Weight Loss: 0  Weight Gain: 0  Sleep: Decreased Total Hours of Sleep:  (says he is sleeping "okay" but will not give a numeric numbe) Vegetative Symptoms: None  Prior Inpatient Therapy Prior Inpatient Therapy: Yes Prior Therapy Dates: unknown Prior Therapy Facilty/Provider(s): reports hospitalized "at the [facility]"  Reason for Treatment: reports being affected by a UTI  Prior Outpatient Therapy Prior Outpatient Therapy:  (unknown-pt denies) Prior Therapy Dates: unknown Prior Therapy Facilty/Provider(s): unknown Reason for Treatment: unknown            Values / Beliefs Cultural Requests During Hospitalization: None Spiritual Requests During Hospitalization: None        Additional Information 1:1 In Past 12 Months?: No CIRT Risk: No Elopement Risk: No Does patient have medical clearance?: Yes     Disposition:  Disposition Disposition of Patient: Referred to  On Site Evaluation by:   Reviewed with Physician:     [name] 06/09/2011 3:41 PM

## 2011-06-09 NOTE — ED Notes (Signed)
Patient verbalized understanding of plan of care.  Stated patient is hungry ordered warm food tray for patient and gave patient a [place] sandwich.

## 2011-06-09 NOTE — ED Notes (Signed)
Patient under impression from tele psych. The emergency room Doctor will give a prescription for antibiotic and will be discharge.  Explained the Doctor will discuss plan of care. Verbalized understanding.

## 2011-06-09 NOTE — ED Notes (Signed)
Spoke with EDP patient needs to be IVC.

## 2011-06-09 NOTE — ED Notes (Signed)
Called house coverage for need of a sitter.

## 2011-06-09 NOTE — ED Notes (Signed)
Warm food tray given to patient. Patient eating without incident.

## 2011-06-09 NOTE — ED Notes (Signed)
ACT team at bedside.  

## 2011-06-09 NOTE — ED Notes (Signed)
Patient states that it hurts when he urinates.  States that it has been going on for over a year.  States that it only hurts when he is here in [place] and when he drinks energy drinks is alleviates the pain

## 2011-06-09 NOTE — ED Notes (Signed)
Pt states that when he was stuck for labs it made his penis hurt, asked RN if I knew why that would happen. Pt states that when he drinks energy drinks it alleviates the pain.

## 2011-06-10 MED ORDER — ZIPRASIDONE MESYLATE 20 MG IM SOLR
INTRAMUSCULAR | Status: AC
  Filled 2011-06-10: qty 20

## 2011-06-10 MED ORDER — ZIPRASIDONE MESYLATE 20 MG IM SOLR
20.0000 mg | Freq: Once | INTRAMUSCULAR | Status: AC
  Administered 2011-06-10: 20 mg via INTRAMUSCULAR

## 2011-06-10 NOTE — ED Notes (Signed)
Dinner tray ordered, reg nonsharp 

## 2011-06-10 NOTE — ED Provider Notes (Signed)
IVC forms completed when Pt wanted to leave ED after TelePsych recommended inpatient admit for psychosis/delusions.  Pt verbally de-escalated for agitation and aware of IVC.  Pt appears not to realize he is psychotic and has wandering speech with delusions.  Hurman Horn, MD 06/11/11 [phone]

## 2011-06-10 NOTE — ED Provider Notes (Signed)
8:45 AM Patient requesting a telephone to call his attorney.  He is actively hallucinating.  Per RN, the patient is being aggressive and threatening.  Geodon ordered.   Gerhard Munch, MD 06/10/11 [phone]

## 2011-06-10 NOTE — ED Notes (Signed)
Dinner tray delivered.

## 2011-06-10 NOTE — ED Notes (Signed)
Assumed care of pt.  Pt psychotic.  Reports that he thinks that RN allowed people to break into his house last night.  Pt reassured.  Security at bedside due to pt yelling.  Sitter at bedside.  Provided with another sitter sheet.

## 2011-06-10 NOTE — ED Notes (Signed)
Patient sleeping at this time, sitter at bedside, NAD noted

## 2011-06-11 ENCOUNTER — Encounter (HOSPITAL_COMMUNITY): Payer: Self-pay

## 2011-06-11 ENCOUNTER — Inpatient Hospital Stay (HOSPITAL_COMMUNITY)
Admission: AD | Admit: 2011-06-11 | Discharge: 2011-06-18 | DRG: 885 | Disposition: A | Discharge status: Home / Self Care | Payer: Medicare Other | Source: Ambulatory Visit | Attending: Psychiatry | Admitting: Psychiatry

## 2011-06-11 DIAGNOSIS — N400 Enlarged prostate without lower urinary tract symptoms: Secondary | ICD-10-CM | POA: Diagnosis present

## 2011-06-11 DIAGNOSIS — F259 Schizoaffective disorder, unspecified: Principal | ICD-10-CM | POA: Diagnosis present

## 2011-06-11 DIAGNOSIS — B182 Chronic viral hepatitis C: Secondary | ICD-10-CM | POA: Diagnosis present

## 2011-06-11 DIAGNOSIS — F250 Schizoaffective disorder, bipolar type: Secondary | ICD-10-CM | POA: Diagnosis present

## 2011-06-11 DIAGNOSIS — B1920 Unspecified viral hepatitis C without hepatic coma: Secondary | ICD-10-CM | POA: Diagnosis present

## 2011-06-11 DIAGNOSIS — N390 Urinary tract infection, site not specified: Secondary | ICD-10-CM | POA: Diagnosis present

## 2011-06-11 MED ORDER — MAGNESIUM HYDROXIDE 400 MG/5ML PO SUSP: 30.0000 mL | Freq: Every day | ORAL | Status: DC | PRN

## 2011-06-11 NOTE — BH Assessment (Signed)
Assessment Note   [name] is an 71 y.o. male who presented to [facility] with painful urination only relieved by drinking energy drinks or leaving [place].  He is religiously preoccupied and denies previous psychiatric treatment.  He denies SI, HI, and psychosis, but is not fully oriented.  [name] will be admitted to [facility] by Dr Allena Katz and will go to room 401-1.  Pt will not need to sign his support paperwork as he is involuntarily committed and will go by [employer].  There is no precert as he is a medicare patient.  ED staff notified of disposition.  Axis I: Schizophrenia, Paranoid Type Axis II: Deferred Axis III:  Past Medical History  Diagnosis Date  . Schizophrenia   . Hepatitis C   . Benign hypertrophy of prostate    Axis IV: problems related to social environment and problems with access to health care services Axis V: 21-30 behavior considerably influenced by delusions or hallucinations OR serious impairment in judgment, communication OR inability to function in almost all areas  Past Medical History:  Past Medical History  Diagnosis Date  . Schizophrenia   . Hepatitis C   . Benign hypertrophy of prostate     Past Surgical History  Procedure Date  . Knee surgery     right    Family History: History reviewed. No pertinent family history.  Social History:  reports that he has quit smoking. He does not have any smokeless tobacco history on file. He reports that he does not drink alcohol. His drug history not on file.  Additional Social History:    Allergies: No Known Allergies  Home Medications:  (Not in a hospital admission)  OB/GYN Status:  No LMP for male patient.  General Assessment Data Location of Assessment: [facility] ED Living Arrangements: Alone Can pt return to current living arrangement?: Yes Admission Status: Involuntary Is patient capable of signing voluntary admission?: No Transfer from: Acute Hospital Referral Source: MD  Education Status Is patient  currently in school?: No  Risk to self Suicidal Ideation: No Suicidal Intent: No Is patient at risk for suicide?: No Suicidal Plan?: No Access to Means: No What has been your use of drugs/alcohol within the last 12 months?: none per labs Previous Attempts/Gestures: No How many times?: 0  Other Self Harm Risks: 0 Triggers for Past Attempts: Unpredictable Intentional Self Injurious Behavior: None Family Suicide History: No Recent stressful life event(s): Recent negative physical changes;Turmoil (Comment) Persecutory voices/beliefs?: Yes Depression: No Substance abuse history and/or treatment for substance abuse?: No Suicide prevention information given to non-admitted patients: Not applicable  Risk to Others Homicidal Ideation: No Thoughts of Harm to Others: No Current Homicidal Intent: No Current Homicidal Plan: No Access to Homicidal Means: No Identified Victim: n/a History of harm to others?: No Assessment of Violence: None Noted Violent Behavior Description: none Does patient have access to weapons?: No Criminal Charges Pending?: No Does patient have a court date: No  Psychosis Hallucinations: Visual Delusions: Grandiose  Mental Status Report Appear/Hygiene: Body odor Eye Contact: Poor Motor Activity: Freedom of movement;Psychomotor retardation Speech: Incoherent;Soft Level of Consciousness: Quiet/awake Mood: Ambivalent;Apathetic;Empty Affect: Apathetic;Inconsistent with thought content;Preoccupied Anxiety Level: Moderate Thought Processes: Irrelevant;Flight of Ideas Judgement: Impaired Orientation: Person;Place Obsessive Compulsive Thoughts/Behaviors: Severe  Cognitive Functioning Concentration: Decreased Memory: Recent Impaired;Remote Impaired IQ: Average Insight: Poor Impulse Control: Poor Appetite: Good Weight Loss: 0  Weight Gain: 0  Sleep: Decreased Total Hours of Sleep: 0  Vegetative Symptoms: None  Prior Inpatient Therapy Prior Inpatient  Therapy: Yes Prior Therapy Dates: unknown Prior Therapy Facilty/Provider(s): hospitalized previously at the [name] Reason for Treatment: reports being infected by a UTI  Prior Outpatient Therapy Prior Outpatient Therapy: Yes Prior Therapy Dates: currently [facility] Reason for Treatment: psychosis            Values / Beliefs Cultural Requests During Hospitalization: None Spiritual Requests During Hospitalization: None        Additional Information 1:1 In Past 12 Months?: No CIRT Risk: No Elopement Risk: No Does patient have medical clearance?: Yes     Disposition:  Disposition Disposition of Patient: Inpatient treatment program Type of inpatient treatment program: Adult Patient referred to:  (Accepted to [facility] 401-1 [name])  On Site Evaluation by:   Reviewed with Physician:     Steward Ros 06/11/2011 8:53 PM

## 2011-06-11 NOTE — ED Notes (Signed)
Dinner tray delivered.

## 2011-06-11 NOTE — ED Provider Notes (Signed)
Pt resting comfortably, without complaints but reports he is here "for religious purposes" Spoke to ACT, he is being reviewed at [facility] BP 171/82  Pulse 95  Temp(Src) 97.9 F (36.6 C) (Oral)  Resp 20  SpO2 100% Labs reviewed   Joya Gaskins, MD 06/11/11 1600

## 2011-06-11 NOTE — ED Notes (Signed)
Dinner tray ordered.

## 2011-06-11 NOTE — ED Notes (Signed)
Pt states "God has spoke to me and told me to ask for a discharge to go home and be healed".

## 2011-06-11 NOTE — ED Notes (Signed)
Patient is resting comfortably. Laying back down to sleep

## 2011-06-12 ENCOUNTER — Encounter (HOSPITAL_COMMUNITY): Payer: Self-pay

## 2011-06-12 LAB — URINE CULTURE
Colony Count: >=100000
Culture  Setup Time: 201305151133

## 2011-06-12 MED ORDER — NICOTINE 21 MG/24HR TD PT24: 21.0000 mg | MEDICATED_PATCH | Freq: Every day | TRANSDERMAL | Status: DC | PRN

## 2011-06-12 MED ORDER — DIVALPROEX SODIUM ER 500 MG PO TB24
1000.0000 mg | ORAL_TABLET | Freq: Every day | ORAL | Status: DC
  Administered 2011-06-13 – 2011-06-14 (×2): 1000 mg via ORAL
  Filled 2011-06-12 (×6): qty 2

## 2011-06-12 MED ORDER — CIPROFLOXACIN HCL 500 MG PO TABS
500.0000 mg | ORAL_TABLET | Freq: Two times a day (BID) | ORAL | Status: DC
  Administered 2011-06-13 – 2011-06-14 (×3): 500 mg via ORAL
  Filled 2011-06-12 (×9): qty 1

## 2011-06-12 MED ORDER — ZOLPIDEM TARTRATE 5 MG PO TABS: 5.0000 mg | ORAL_TABLET | Freq: Every evening | ORAL | Status: DC | PRN

## 2011-06-12 MED ORDER — HALOPERIDOL 5 MG PO TABS: 5.0000 mg | ORAL_TABLET | Freq: Four times a day (QID) | ORAL | Status: DC | PRN

## 2011-06-12 MED ORDER — LORAZEPAM 2 MG/ML IJ SOLN: 2.0000 mg | Freq: Four times a day (QID) | INTRAMUSCULAR | Status: DC | PRN

## 2011-06-12 MED ORDER — LORAZEPAM 1 MG PO TABS: 1.0000 mg | ORAL_TABLET | Freq: Three times a day (TID) | ORAL | Status: DC | PRN

## 2011-06-12 MED ORDER — LORAZEPAM 1 MG PO TABS: 2.0000 mg | ORAL_TABLET | Freq: Four times a day (QID) | ORAL | Status: DC | PRN

## 2011-06-12 MED ORDER — RISPERIDONE 2 MG PO TABS
2.0000 mg | ORAL_TABLET | Freq: Every day | ORAL | Status: DC
  Filled 2011-06-12 (×4): qty 1

## 2011-06-12 MED ORDER — HALOPERIDOL LACTATE 5 MG/ML IJ SOLN: 5.0000 mg | Freq: Four times a day (QID) | INTRAMUSCULAR | Status: DC | PRN

## 2011-06-12 NOTE — H&P (Signed)
  Psychiatric Admission Assessment Adult  Patient Identification:  [name] Date of Evaluation:  06/12/2011  71yo SAAM CC: presented c/o dysuria on IVC for delusions  History of Present Illness: Presents c/o a burning in his penis when he urinates. [name] hurts when he is here in [place]. His symptoms which have have been present for  a year and  improved when he drank energy drinks and moved to [place].  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   Past Psychiatric History: Vet cannot tell me and [facility]'t [facility] records.    Substance Abuse History:  Social History:    reports that he quit smoking about 36 years ago. He does not have any smokeless tobacco history on file. He reports that he does not drink alcohol or use illicit drugs.UDS negative   Family Psych History: Won't tell me.  Past Medical History:     Past Medical History  Diagnosis Date  . Schizophrenia   . Hepatitis C   . Benign hypertrophy of prostate        Past Surgical History  Procedure Date  . Knee surgery     right    Allergies: No Known Allergies  Current Medications:  Prior to Admission medications   Not on File    Mental Status Examination/Evaluation: Objective:  Appearance: Fairly Groomed  Psychomotor Activity:  Increased  Eye Contact::  Minimal  Speech:  Pressured  Volume:  Increased  Mood: irritable    Affect:  Congruent  Thought Process:  Clear rational goal oriented -wants discharge -here illegally  Orientation:  Full  Thought Content:remains delusional    Suicidal Thoughts:  No  Homicidal Thoughts:  No  Judgement:  Poor  Insight:  Lacking    DIAGNOSIS:    AXIS I Schizoaffective Disorder    AXIS II Deferred  AXIS III See medical history.  AXIS IV probably non-compliant with meds   AXIS V 21-30 behavior considerably influenced by delusions or hallucinations OR serious impairment in judgment, communication OR inability to function in almost all areas     Treatment Plan Summary: Admit for safety and stabilization Re-initiate meds  [facility]

## 2011-06-12 NOTE — Progress Notes (Signed)
Pt. Was cooperative during the assessment.  Pt. Continues to talk about his penis burning and stinging when she pees.  He reports that it only stops burning when he drinks energy drinks or leaves [place].  Pt. Also reports that he went all the way to [place] and it quit burning but started back as soon as he came back into [place].  Pt. Did not want anything to help him sleep.  He reports that he does not have any trouble sleeping and pt. Did go to sleep after taking a shower.  Pt. Denies that his penis is burning at this time or that he has any pain at all.  Pt. Also denies voices, SI, and or HI.

## 2011-06-12 NOTE — H&P (Signed)
  Pt was seen by me today and I agree with the key elements documented in H&P.  

## 2011-06-12 NOTE — BHH Suicide Risk Assessment (Signed)
Suicide Risk Assessment  Admission Assessment     Demographic factors:   AA single Current Mental Status:   see below Loss Factors:   unable to work Historical Factors:   denies SI attemots Risk Reduction Factors:   does not believe in SI, wants to get better  CLINICAL FACTORS:   Currently Psychotic  COGNITIVE FEATURES THAT CONTRIBUTE TO RISK:  Loss of executive function    SUICIDE RISK:   Mild:  Suicidal ideation of limited frequency, intensity, duration, and specificity.  There are no identifiable plans, no associated intent, mild dysphoria and related symptoms, good self-control (both objective and subjective assessment), few other risk factors, and identifiable protective factors, including available and accessible social support.  PLAN OF CARE:  Mental Status Examination/Evaluation:  Objective: Appearance: disshelved  Psychomotor Activity: Increased   Eye Contact:: Minimal   Speech: Pressured   Volume: Increased   Mood: irritable   Affect: Congruent   Thought Process: Clear rational goal oriented -wants discharge -here illegally   Orientation: Full   Thought Content:remains delusional   Suicidal Thoughts: No   Homicidal Thoughts: No   Judgement: Poor   Insight: Lacking   DIAGNOSIS:  AXIS I  Schizoaffective Disorder  AXIS II  Deferred   AXIS III  See medical history.   AXIS IV  non-compliant with meds   AXIS V  20  Treatment Plan Summary:  Admit for safety and stabilization  Re-initiate meds . Will add risperidone Contact [facility] re: [facility]    Wonda Cerise 06/12/2011, 7:48 PM

## 2011-06-12 NOTE — Progress Notes (Signed)
Patient ID: [name], male   DOB: Jul 24, 1940, 71 y.o.   MRN: [id number]  [facility] Group Notes:  (Counselor/Nursing/MHT/Case Management/Adjunct)  06/12/2011 11 AM  Type of Therapy:  Aftercare Planning, Group Therapy, Dance/Movement Therapy   Participation Level:  Did Not Attend  Rhunette Croft

## 2011-06-12 NOTE — Progress Notes (Addendum)
[facility] Adult Inpatient Family/Significant Other Suicide Prevention Education  Suicide Prevention Education:  Education Completed; Biagio Borg (sister) [phone],  (name of family member/significant other) has been identified by the patient as the family member/significant other with whom the patient will be residing, and identified as the person(s) who will aid the patient in the event of a mental health crisis (suicidal ideations/suicide attempt).  With written consent from the patient, the family member/significant other has been provided the following suicide prevention education, prior to the and/or following the discharge of the patient.  The suicide prevention education provided includes the following:  Suicide risk factors  Suicide prevention and interventions  National Suicide Hotline telephone number  [facility] assessment telephone number  [facility] Emergency Assistance 911  [facility] and/or Residential Mobile Crisis Unit telephone number  Request made of family/significant other to:  Remove weapons (e.g., guns, rifles, knives), all items previously/currently identified as safety concern.    Remove drugs/medications (over-the-counter, prescriptions, illicit drugs), all items previously/currently identified as a safety concern.  The family member/significant other verbalizes understanding of the suicide prevention education information provided.  The family member/significant other agrees to remove the items of safety concern listed above.  Sister did not know the pt was at the hospital, she reports she had not heard from him in a week. She stated that the pt does well when he is on his medications. She stated that pt is homeless and living [place] friends and [place]. The pt is working with the [name] tring to find a place to live.   [facility] 06/13/2011, 12:35 PM

## 2011-06-12 NOTE — BHH Counselor (Signed)
Adult Comprehensive Assessment  Patient ID: [name], male   DOB: [birth year], 71 y.o.   MRN: [id number]  Information Source: Information source: Patient  Current Stressors:  Educational / Learning stressors: None reported Employment / Job issues: None reported Family Relationships: None reported Surveyor, quantity / Lack of resources (include bankruptcy): None reported Housing / Lack of housing: Lives with sister Physical health (include injuries & life threatening diseases): None reported Social relationships: None reported Substance abuse: None reported Bereavement / Loss: None reported  Living/Environment/Situation:  Living Arrangements: Other relatives (Lives with sister) Living conditions (as described by patient or guardian): "Good place to live" How long has patient lived in current situation?: 10-15 yrs. What is atmosphere in current home: Comfortable  Family History:  Marital status: Single Does patient have children?: No  Childhood History:  By whom was/is the patient raised?: Both parents Additional childhood history information: None to reported Description of patient's relationship with caregiver when they were a child: "Good parents" Patient's description of current relationship with people who raised him/her: "Good" Does patient have siblings?: Yes Number of Siblings: 3  (1 sister, 2 brother) Description of patient's current relationship with siblings: "We get along" Did patient suffer any verbal/emotional/physical/sexual abuse as a child?: No Did patient suffer from severe childhood neglect?: No Has patient ever been sexually abused/assaulted/raped as an adolescent or adult?: No Was the patient ever a victim of a crime or a disaster?: No Witnessed domestic violence?: No Has patient been effected by domestic violence as an adult?: No  Education:  Highest grade of school patient has completed: Producer, television/film/video Currently a student?: No Learning disability?:  No  Employment/Work Situation:   Employment situation: Unemployed Patient's job has been impacted by current illness: No What is the longest time patient has a held a job?: None reported Where was the patient employed at that time?: None reported Has patient ever been in the [employer]?: No Has patient ever served in Buyer, retail?: No  Financial Resources:   Surveyor, quantity resources: No income Does patient have a Lawyer or guardian?: No  Alcohol/Substance Abuse:   What has been your use of drugs/alcohol within the last 12 months?: None reported If attempted suicide, did drugs/alcohol play a role in this?: No Alcohol/Substance Abuse Treatment Hx: Denies past history Has alcohol/substance abuse ever caused legal problems?: No  Social Support System:   Conservation officer, nature Support System: Fair Development worker, community Support System: Sister, family Type of faith/religion: None reported How does patient's faith help to cope with current illness?: None reported  Leisure/Recreation:   Leisure and Hobbies: "Like to swim"  Strengths/Needs:   What things does the patient do well?: None reported In what areas does patient struggle / problems for patient: None reported  Discharge Plan:   Does patient have access to transportation?: Yes (sister) Will patient be returning to same living situation after discharge?: Yes Currently receiving community mental health services: No If no, would patient like referral for services when discharged?: Yes (What county?) Medical sales representative) Does patient have financial barriers related to discharge medications?: No  Summary/Recommendations:   Summary and Recommendations (to be completed by the evaluator): Pt. is a 68 yr. old male.  Recommendations for treatment include crisis stabilization, case mgmt., medication mgmt., psycoeducation to teach coping skills and group therapy.  Rhunette Croft. 06/12/2011

## 2011-06-12 NOTE — Tx Team (Signed)
Initial Interdisciplinary Treatment Plan  PATIENT STRENGTHS: (choose at least two) Supportive family/friends  PATIENT STRESSORS: Health problems   PROBLEM LIST: Problem List/Patient Goals Date to be addressed Date deferred Reason deferred Estimated date of resolution  Hx schizophrenia paranoid type            Pt. Is convinced his penis burns when he is in [place]                                           DISCHARGE CRITERIA:  Improved stabilization in mood, thinking, and/or behavior Motivation to continue treatment in a less acute level of care Verbal commitment to aftercare and medication compliance  PRELIMINARY DISCHARGE PLAN: Attend PHP/IOP Participate in family therapy Return to previous living arrangement  PATIENT/FAMIILY INVOLVEMENT: This treatment plan has been presented to and reviewed with the patient, [name], and/or family member,  The patient and family have been given the opportunity to ask questions and make suggestions.  Cooper Render 06/12/2011, 2:18 AM

## 2011-06-13 DIAGNOSIS — N390 Urinary tract infection, site not specified: Secondary | ICD-10-CM

## 2011-06-13 DIAGNOSIS — F259 Schizoaffective disorder, unspecified: Principal | ICD-10-CM

## 2011-06-13 DIAGNOSIS — B182 Chronic viral hepatitis C: Secondary | ICD-10-CM | POA: Diagnosis present

## 2011-06-13 DIAGNOSIS — B1920 Unspecified viral hepatitis C without hepatic coma: Secondary | ICD-10-CM | POA: Diagnosis present

## 2011-06-13 HISTORY — DX: Urinary tract infection, site not specified: N39.0

## 2011-06-13 MED ORDER — RISPERIDONE 2 MG PO TBDP
2.0000 mg | ORAL_TABLET | Freq: Every day | ORAL | Status: DC
  Administered 2011-06-13: 2 mg via ORAL
  Filled 2011-06-13 (×4): qty 1

## 2011-06-13 NOTE — Progress Notes (Signed)
[facility] MD Progress Note                                         06/13/2011    EH SESAY 08/13/1940    [id number] Hospital day #2  Dx: Schizoaffective disorder The patient was seen today and reports the following:  Sleep: 0 Appetite: "all meals eaten"  Mild>(1-10) >Severe  Hopelessness (1-10): unanswered Depression (1-10): unanswered Anxiety (1-10): unanswered Suicidal Ideation: . unanswered Plan: None Intent: None Means:  None Homicidal Ideation: unanwered Plan: None Intent: None Means: None  Eye Contact: Good.  General Appearance/Behavior: Pt. Is initially cooperative, but escalates easily Motor Behavior: becomes aggressive and hostile Speech: loud and angry  Mental Status: disorganized Level of Consciousness:  alert Mood: irritated Affect: angry  Thought Process: disorganized Thought Content: delusional, states "I'm here against my will. And MCR won't be charged." Perception: lacking  Judgment: poor Insight: lacking Cognition: ?  Sleep: Number of Hours:   Filed Vitals:   06/13/11 0839  BP: 132/81  Pulse: 76  Temp:   Resp:       . ciprofloxacin  500 mg Oral BID  . divalproex  1,000 mg Oral Daily  . risperiDONE  2 mg Oral QHS     No results found for this or any previous visit (from the past 48 hour(s) ROS:    Constitutional: WDWN AAF he refuses to answer any questions.   Time was spent with the patient listening to him speak and attempting to get to know him.  He escalates easily after he realizes he doesn't know the day of the week or what city he is in.  He becomes agitated and angry, paces angrily about the room, attempts to intimidate this provider by standing close to me.  He continues to pace and yell when I do not respond to his physical position.   Treatment Plan Summary:  1. Daily contact with patient to assess and evaluate symptoms and progress in treatment.  2. Medication management  3. The patient will deny suicidal ideations or  homicidal ideations for 48 hours prior to discharge and have a depression and anxiety rating of 3 or less. The patient will also deny any auditory or visual hallucinations or delusional thinking.  4. The patient will deny any symptoms of substance withdrawal at time of discharge.   Treatment Plan: 1. Pt. Will be placed on a 1:1 due to his impulsive and aggressive behaviors. 2. Consult tomorrow for possible "force meds order." 3. No roommate will be continued. 4. Risperidol will be changed to Mtab to facilitate dose.  Rona Ravens. Armella Stogner PAC

## 2011-06-13 NOTE — Progress Notes (Signed)
Patient ID: [name], male   DOB: [birth year], 71 y.o.   MRN: [id number] Has been isolative to his room most of the evening, refused his meds and became loud and argumentative.  Talked louder and didn't want to know about his meds, tried to explain the importance of taking antibiotics, wouldn't listen and just began demanding that we release him and let him go home.  Angry that he is here, pounding on arm of chair.  Refused help to get ready for bed, sat up in chair and wouldn't go to bed, fell asleep in chair.  Has been having conversations with himself, responding to internal stimuli and making motions at visuals.  Will continue to monitor.

## 2011-06-13 NOTE — Progress Notes (Signed)
Patient ID: [name], male   DOB: Oct 04, 1940, 71 y.o.   MRN: [id number]  1:1 Initial Note: D: Pt has been flat and depressed on the unit, he has also been very isolative. Pt has refused all medication and continues to have disorganized thoughts. Pt has been very irritable and gets agitated when staff attempts to talk to him. A: Pt was seen by doctor, orders written for a 1:1 for safety. R: Pts safety maintained.

## 2011-06-14 DIAGNOSIS — F250 Schizoaffective disorder, bipolar type: Secondary | ICD-10-CM | POA: Diagnosis present

## 2011-06-14 MED ORDER — DIVALPROEX SODIUM ER 500 MG PO TB24
1000.0000 mg | ORAL_TABLET | Freq: Every day | ORAL | Status: DC
  Administered 2011-06-15: 1000 mg via ORAL
  Filled 2011-06-14 (×2): qty 2

## 2011-06-14 MED ORDER — CIPROFLOXACIN HCL 500 MG PO TABS
500.0000 mg | ORAL_TABLET | ORAL | Status: DC
  Administered 2011-06-14 – 2011-06-18 (×8): 500 mg via ORAL
  Filled 2011-06-14: qty 1
  Filled 2011-06-14 (×2): qty 28
  Filled 2011-06-14 (×4): qty 1
  Filled 2011-06-14: qty 28
  Filled 2011-06-14 (×4): qty 1
  Filled 2011-06-14: qty 28
  Filled 2011-06-14: qty 1

## 2011-06-14 MED ORDER — HALOPERIDOL 5 MG PO TABS
5.0000 mg | ORAL_TABLET | ORAL | Status: DC
  Administered 2011-06-14 – 2011-06-15 (×2): 5 mg via ORAL
  Filled 2011-06-14 (×5): qty 1

## 2011-06-14 MED ORDER — BENZTROPINE MESYLATE 1 MG PO TABS
1.0000 mg | ORAL_TABLET | ORAL | Status: DC
  Administered 2011-06-14 – 2011-06-18 (×8): 1 mg via ORAL
  Filled 2011-06-14: qty 28
  Filled 2011-06-14 (×2): qty 1
  Filled 2011-06-14: qty 28
  Filled 2011-06-14 (×2): qty 1
  Filled 2011-06-14: qty 28
  Filled 2011-06-14: qty 1
  Filled 2011-06-14: qty 28
  Filled 2011-06-14 (×6): qty 1

## 2011-06-14 NOTE — Progress Notes (Signed)
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   The patient was seen today and reports the following:   ADL's: Intact.  Sleep: The patient reports to sleeping well last night.  Appetite: The patient reports a good appetite today.   Mild>(1-10) >Severe  Hopelessness (1-10): 0  Depression (1-10): 0  Anxiety (1-10): 0   Suicidal Ideation: The patient denies any suicidal ideations today.  Plan: No  Intent: No  Means: No   Homicidal Ideation: The patient denies any homicidal ideations today.  Plan: No  Intent: No.  Means: No   General Appearance/Behavior: The patient was significantly manic in his presentation with no insight into his illness.  He was minimally cooperative with this provider.  Eye Contact: Fair.  Speech: Increased in rate and volume with moderate pressuring of speech noted today. Motor Behavior: Moderately agitated..  Level of Consciousness: Alert and Oriented x 3.  Mental Status: Alert and Oriented x 3.  Mood: Moderately Manic.  Affect: Moderately Expansive.  Anxiety Level: Mild anxiety reported today.  Thought Process: No insight into his illness.  The patient appears to be responding to grandiose delusions.  Thought Content: The patient denies any auditory or visual hallucinations today. He does appear grandiose and delusional. Perception:  The patient has no insight into his illness. Judgment: Poor.  Insight: Poor.  Cognition: Oriented to person, place and time.  Sleep:  Number of Hours: 6.75    Vital Signs:Blood pressure 131/85, pulse 99, temperature 97.9 F (36.6 C), temperature source Oral, resp. rate 17, height 5\' 7"  (1.702 m), weight 75.297 kg (166 lb).  Current Medications: Current Facility-Administered Medications  Medication Dose Route Frequency Provider Last Rate Last Dose  . benztropine (COGENTIN) tablet 1 mg  1 mg Oral BH-qamhs Dequante Tremaine D Jaqualin Serpa, MD      . ciprofloxacin (CIPRO) tablet  500 mg  500 mg Oral BH-qamhs Danyla Wattley D Camden Knotek, MD      . divalproex (DEPAKOTE ER) 24 hr tablet 1,000 mg  1,000 mg Oral Q2200 Delmy Holdren D Lipa Knauff, MD      . haloperidol (HALDOL) tablet 5 mg  5 mg Oral Q6H PRN Mickie D. Adams, PA       Or  . haloperidol lactate (HALDOL) injection 5 mg  5 mg Intramuscular Q6H PRN Mickie D. Adams, PA      . haloperidol (HALDOL) tablet 5 mg  5 mg Oral BH-qamhs Lebaron Bautch D Zane Samson, MD      . LORazepam (ATIVAN) tablet 2 mg  2 mg Oral Q6H PRN Mickie D. Pernell Dupre, PA       Or  . LORazepam (ATIVAN) injection 2 mg  2 mg Intramuscular Q6H PRN Mickie D. Pernell Dupre, PA      . LORazepam (ATIVAN) tablet 1 mg  1 mg Oral Q8H PRN Verne Spurr, PA-C      . nicotine (NICODERM CQ - dosed in mg/24 hours) patch 21 mg  21 mg Transdermal Daily PRN Verne Spurr, PA-C      . DISCONTD: ciprofloxacin (CIPRO) tablet 500 mg  500 mg Oral BID Verne Spurr, PA-C   500 mg at 06/14/11 1096  . DISCONTD: divalproex (DEPAKOTE ER) 24 hr tablet 1,000 mg  1,000 mg Oral Daily Mickie D. Adams, PA   1,000 mg at 06/14/11 0454  . DISCONTD: risperiDONE (RISPERDAL M-TABS) disintegrating tablet 2 mg  2 mg Oral QHS Verne Spurr, PA-C   2 mg at 06/13/11 2159   Lab Results:  No results found for this or any previous visit (from the past 48 hour(s)).  Review of Systems:  Neurological: No headaches, seizures or dizziness reported.  G.I.: The patient denies any constipation or stomach upset today.  Musculoskeletal: The patient denies any musculoskeletal issues today.   Time was spent today attempting to discuss with the patient his reason for admission.  The patient was angry stating that he was "pick up off the street and brought in here."  The patient has no insight into his illness and states he does not need to be in the hospital.  The patient reports to sleeping and eating well and denies any depressive symptoms.  The patient appears to be delusional with grandiose ideas.  He requires ongoing hospitalization for evaluation  and treatment.  Treatment Plan Summary:  1. Daily contact with patient to assess and evaluate symptoms and progress in treatment.  2. Medication management  3. The patient will deny suicidal ideations or homicidal ideations for 48 hours prior to discharge and have a depression and anxiety rating of 3 or less. The patient will also deny any auditory or visual hallucinations or delusional thinking.  4. The patient will deny any symptoms of substance withdrawal at time of discharge.   Plan:  1. Will continue the patient on her non-psychiatric medications.  2. Will continue the patient on the medication Depakote ER 1000 mgs po qhs for mood stabilization. 3. Will start the medication Haldol 5 mgs po qam and qhs for psychosis.  4. Will start the medication Cogentin at 1 mg po q am and hs for EPS.  5. Will discontinue the medication Risperdal M-tabs since the patient has responded well in the past to haldol. 6. Laboratory Studies reviewed.  7. Will continue to monitor.  8. Will continue the patient on 1 to 1 for the next 24 hours and then re-evaluate.  Stanford Strauch 06/14/2011, 4:00 PM

## 2011-06-14 NOTE — Discharge Planning (Signed)
Did not meet with patient today, as he did not attend Aftercare Planning Group, and was loud, unable to speak with Case Manager individually.  Will discuss with doctor whether [facility] referral should be initiated.  Per State Regulation 482.30  This chart was reviewed for medical necessity with respect to the patient's Admission/Duration of stay.   Next review due:  06/17/11   Ambrose Mantle, LCSW  06/14/2011  12:01 PM

## 2011-06-14 NOTE — Progress Notes (Signed)
Pt. Sitting in his room preoccupied with wanting to go home.  Pt. Denies SI/HI and denies A/V hallucinations.  Denies pain.  Support given.

## 2011-06-14 NOTE — Progress Notes (Signed)
Patient ID: [name], male   DOB: 05-03-1940, 71 y.o.   MRN: [id number] Has been more pleasant this evening, although interactions are minimal, answers are one or two words and loud, d/t HOH.  Was receptive and cooperative with taking meds.  Has spent most of the evening in his chair, doesn't want to get into bed. Still seems internally focused, occasional whisper.  Remains on 1:1 obs for safety.  Will continue to monitor.

## 2011-06-14 NOTE — Progress Notes (Signed)
[facility] Group Notes:  (Counselor/Nursing/MHT/Case Management/Adjunct)  06/14/2011 2:39 PM  Type of Therapy:  Group Therapy  Participation Level:  Did Not Attend Veto Kemps 06/14/2011, 2:39 PM

## 2011-06-14 NOTE — Tx Team (Addendum)
Interdisciplinary Treatment Plan Update (Adult)  Date:  06/14/2011  Time Reviewed:  10:15AM-11:15AM  Progress in Treatment: Attending groups:  No Participating in groups:  No   Taking medication as prescribed:    Yes Tolerating medication:  Yes  Family/Significant other contact made:  Yes, with sister Patient understands diagnosis:   No, in denial Discussing patient identified problems/goals with staff:   Yes, but is only focused on getting out of hospital Medical problems stabilized or resolved:   Deferred to Treatment Team tomorrow Denies suicidal/homicidal ideation:  Refuses to answer any questions about mood, symptoms Issues/concerns per patient self-inventory:   None Other:    New problem(s) identified: Yes, Describe:  patient says he lives with sister; she reports that he lives in the woods and is homeless  Reason for Continuation of Hospitalization: Aggression Delusions  Hallucinations Medication stabilization Other; describe hyper-religious, on 1:1 for safety  Interventions implemented related to continuation of hospitalization:  Medication monitoring and adjustment, safety checks Q15 min., suicide risk assessment, group therapy, psychoeducation, collateral contact, aftercare planning, ongoing physician assessments, medication education, 1:1 for safety  Additional comments:  Not applicable  Estimated length of stay:  5-7 days  Discharge Plan:  Unknown at this time.  [facility]  New goal(s):  Not applicable  Review of initial/current patient goals per problem list:   1.  Goal(s):  Determine placement for patient.  Met:  No  Target date:  By Discharge   As evidenced by:  New, patient will not discuss at this time  2.  Goal(s):  Reduce psychotic symptoms to baseline  Met:  No  Target date:  By Discharge   As evidenced by:  Patient appears to be responding to internal stimuli, is delusional and loud, believes has been cured by God  3.  Goal(s):   Determine what patient's baseline is through family contact  Met:  No  Target date:  By Discharge   As evidenced by:  Family contact has only been made for Suicide Prevention Information, still needed to gather collateral information  4.  Goal(s):  Be sufficiently safe to come off 1:1 at least 24 hours prior to D/C.  Met:  No  Target date:  By Discharge   As evidenced by:  Still on 1:1 today for loud, intimidating behavior (but is not making actual threats)  Attendees: Patient:  Did not attend -- too volatile & demanding   Family:     Physician:  Dr. Harvie Heck Readling 06/14/2011 10:15AM-11:15AM  Nursing:   Tacy Learn, RN 06/14/2011 10:15AM -11:15AM   Case Manager:  Ambrose Mantle, LCSW 06/14/2011 10:15AM-11:15AM  Counselor:  Veto Kemps, MT-BC 06/14/2011 10:15AM-11:15AM  Other:   Verne Spurr, PA 06/14/2011 10:15AM-11:15AM  Other:      Other:      Other:       Scribe for Treatment Team:   Sarina Ser, 06/14/2011, 10:15AM-11:15AM

## 2011-06-15 MED ORDER — HALOPERIDOL 5 MG PO TABS
10.0000 mg | ORAL_TABLET | Freq: Every day | ORAL | Status: DC
  Administered 2011-06-15 – 2011-06-17 (×3): 10 mg via ORAL
  Filled 2011-06-15 (×5): qty 2

## 2011-06-15 MED ORDER — HALOPERIDOL 5 MG PO TABS
5.0000 mg | ORAL_TABLET | Freq: Every day | ORAL | Status: DC
  Administered 2011-06-16 – 2011-06-18 (×3): 5 mg via ORAL
  Filled 2011-06-15 (×3): qty 1
  Filled 2011-06-15 (×2): qty 42
  Filled 2011-06-15: qty 1

## 2011-06-15 NOTE — Discharge Planning (Signed)
Met with patient in Aftercare Planning Group.   He slept through most of group.  He stated that he lives with his sister and her children, but during Treatment Team when Case Manager reminded him that sister said he lives in the woods, he said that in fact he has several campsites, and lives in nature.  He originally said he gets his follow-up at the [facility] then he changed that to the [facility].  Case Manager asked him individually for signed consent to get his medication records from the [facility] but he refused and stated he did not want [place] to have communication with them.  No case management needs are known today.  Ambrose Mantle, LCSW 06/15/2011, 1:25 PM

## 2011-06-15 NOTE — Progress Notes (Signed)
Has been resting in bed with eyes closed since the beginning of the shift. Awoke spontaneously to name. Appears flat/subdued. Calm and cooperative with assessment. Pt forwards little and offers no questions or concerns. Did state he had an ok day but he was no closer to leaving. Denies pain or discomfort. Denies SI/HI/AVH and contracts for safety. POC and medications for the shift reviewed and understanding verbalized. Safety has been maintained with Q15 minute observation. Will continue current POC.

## 2011-06-15 NOTE — Progress Notes (Signed)
4 hour RN note.  Patient lying quietly on the bed at this time.  Voices no complaints.  Came to medication call this morning and took medications without question or complaint.

## 2011-06-15 NOTE — Progress Notes (Signed)
[facility] Post 1:1 Observation Documentation  For the first (8) hours following discontinuation of 1:1 precautions, a progress note entry by nursing staff should be documented at least every 2 hours, reflecting the patient's behavior, condition, mood, and conversation.  Use the progress notes for additional entries.  Time 1:1 discontinued:  14:00  Patient's Behavior:  Pleasant and cooperative  Patient's Condition:  No change, continues to do well off one to one.  Patient's Conversation:  States he has no needs at this time and is comfortable.    Izola Price Mae 06/15/2011, 8:02 PM

## 2011-06-15 NOTE — Progress Notes (Signed)
[facility] Post 1:1 Observation Documentation  For the first (8) hours following discontinuation of 1:1 precautions, a progress note entry by nursing staff should be documented at least every 2 hours, reflecting the patient's behavior, condition, mood, and conversation.  Use the progress notes for additional entries.  Time 1:1 discontinued:  14:00  Patient's Behavior:  Pleasant and cooperative  Patient's Condition:  Patient continues to be somewhat disconnected, but has not been disruptive in any way.   Patient's Conversation:  Stated he wanted to go outside.  Did not hear the call.  Patient was escorted out to the courtyard.  States he is feeling fine and has no needs at this time.   [name] Mae 06/15/2011, 4:26 PM

## 2011-06-15 NOTE — Progress Notes (Signed)
[facility] Post 1:1 Observation Documentation  For the first (8) hours following discontinuation of 1:1 precautions, a progress note entry by nursing staff should be documented at least every 2 hours, reflecting the patient's behavior, condition, mood, and conversation.  Use the progress notes for additional entries.  Time 1:1 discontinued:  14:00   Patient's Behavior:  Cooperative      Patient's Condition:  Patient is sitting in his room quietly.  States he wants to go home.  When he is told he will not be leaving today he nods in agreement.  Staff report that he was cooperative in the dining room and there were no issues.   Patient's Conversation:    [name] 06/15/2011, 6:18 PM

## 2011-06-15 NOTE — Tx Team (Signed)
Interdisciplinary Treatment Plan Update (Adult)  Date:  06/15/2011  Time Reviewed:  10:15AM-11:15AM  Progress in Treatment: Attending groups:  Yes Participating in groups:    No Taking medication as prescribed:    Yes Tolerating medication:   Yes Family/Significant other contact made:  With sister for SPI, need to gather more information Patient understands diagnosis:   No, in complete denial "I don't have behavioral problems" Discussing patient identified problems/goals with staff:   Yes, focused on giving reasons for discharge Medical problems stabilized or resolved:   Complains of issues with "my eyes and surgical needs", states that he is opposed to being in a behavioral health center, because his issues are physical not behavioral Denies suicidal/homicidal ideation:  Adamantly denies Issues/concerns per patient self-inventory:   None Other:    New problem(s) identified: Yes, Describe:  is paranoid and hyper-religious  Reason for Continuation of Hospitalization: Delusions  Medication stabilization Other; describe paranoia, hyper-religiosity  Interventions implemented related to continuation of hospitalization:  Medication monitoring and adjustment, safety checks Q15 min., suicide risk assessment, group therapy, psychoeducation, collateral contact, aftercare planning, ongoing physician assessments, medication education  Additional comments:  Not applicable  Estimated length of stay:  5-6 days  Discharge Plan:  Patient states that he lives with his sister, but she states that this is not the case -- he actually lives in the woods.  He is [facility] with the [facility] but is unclear about where he goes and when was the last time he was there.  New goal(s):  5.  Reduce paranoia to baseline per patient and family.  6.  Reduce hyper-religiosity to baseline per patient and family.  Review of initial/current patient goals per problem list:   1.  Goal(s):  Determine placement for  patient.  Met:  No  Target date:  By Discharge   As evidenced by:  Patient continues to state he will go to live with his sister, but she says he lives in the woods.  When asked about this, he said he has been communing with nature, and has several campsites.  2.  Goal(s):  Reduce psychotic symptoms to baseline  Met:  No  Target date:  By Discharge   As evidenced by:  Believes that the best healing is through natural herbs, displays significant paranoia about people mocking him, is hyper-religious.  3.  Goal(s):  Determine what patient's baseline is through family contact  Met:  No  Target date:  By Discharge   As evidenced by:  While family contact has been made, we might get additional collateral with another phone call.  4.  Goal(s): Be sufficiently safe to come off 1:1 at least 24 hours prior to D/C.  Met:  No  Target date:  By Discharge   As evidenced by:  Is still on 1:1 at this time.  Attendees: Patient:  [name]  06/15/2011 10:15AM-11:15AM  Family:     Physician:  Dr. Harvie Heck Readling 06/15/2011 10:15AM-11:15AM  Nursing:   Waynetta Sandy, RN 06/15/2011 10:15AM -11:15AM   Case Manager:  Ambrose Mantle, LCSW 06/15/2011 10:15AM-11:15AM  Counselor:  Veto Kemps, MT-BC 06/15/2011 10:15AM-11:15AM  Other:   Verne Spurr, PA 06/15/2011 10:15AM-11:15AM  Other:   Izola Price, RN 06/15/2011 10:15AM-11:15AM  Other:      Other:       Scribe for Treatment Team:   Sarina Ser, 06/15/2011, 10:15AM-11:15AM

## 2011-06-15 NOTE — Progress Notes (Signed)
[facility] Post 1:1 Observation Documentation  For the first (8) hours following discontinuation of 1:1 precautions, a progress note entry by nursing staff should be documented at least every 2 hours, reflecting the patient's behavior, condition, mood, and conversation.  Use the progress notes for additional entries.  Time 1:1 discontinued:  14:00  Patient's Behavior:  Cooperative.  Currently sitting quietly in a chair in his room.  Patient's Condition:  Pleasant and cooperative at this time.    Patient's Conversation:  Currently resting quietly in the chair with his eyes closed.   Izola Price Mae 06/15/2011, 2:16 PM

## 2011-06-15 NOTE — Progress Notes (Signed)
[facility] MD Progress Note  06/15/2011 1:34 PM  Diagnosis:  Axis I: Schizoaffective Disorder - Bipolar Type.   The patient was seen today and reports the following:   ADL's: Intact.  Sleep: The patient reports to sleeping well last night.  Appetite: The patient reports a good appetite today.   Mild>(1-10) >Severe  Hopelessness (1-10): 0  Depression (1-10): 0  Anxiety (1-10): 0   Suicidal Ideation: The patient denies any suicidal ideations today.  Plan: No  Intent: No  Means: No   Homicidal Ideation: The patient denies any homicidal ideations today.  Plan: No  Intent: No.  Means: No   General Appearance/Behavior: The patient remained significantly manic in his presentation with no insight into his illness. He remained minimally cooperative with this provider.  Eye Contact: Fair.  Speech: Increased in rate and volume with moderate pressuring of speech continuing today.  Motor Behavior: Mild to moderately agitated..  Level of Consciousness: Alert and Oriented x 3.  Mental Status: Alert and Oriented x 3.  Mood: Moderately Manic.  Affect: Moderately Expansive.  Anxiety Level: No anxiety reported today.  Thought Process: No insight into his illness. The patient appears to be responding to grandiose delusions which are continuing.  Thought Content: The patient denies any auditory or visual hallucinations today. He continues to appear grandiose and delusional.  Perception: The patient has no insight into his illness.  Judgment: Poor.  Insight: Poor.  Cognition: Oriented to person, place and time.  Sleep:  Number of Hours: 4.75    Vital Signs:Blood pressure 137/84, pulse 92, temperature 97.8 F (36.6 C), temperature source Oral, resp. rate 24, height 5\' 7"  (1.702 m), weight 75.297 kg (166 lb).  Current Medications: Current Facility-Administered Medications  Medication Dose Route Frequency Provider Last Rate Last Dose  . benztropine (COGENTIN) tablet 1 mg  1 mg Oral BH-qamhs Curlene Labrum  Madilyne Tadlock, MD   1 mg at 06/15/11 0820  . ciprofloxacin (CIPRO) tablet 500 mg  500 mg Oral BH-qamhs Curlene Labrum Joven Mom, MD   500 mg at 06/15/11 0820  . divalproex (DEPAKOTE ER) 24 hr tablet 1,000 mg  1,000 mg Oral Q2200 Monzerrat Wellen D Kiosha Buchan, MD      . haloperidol (HALDOL) tablet 5 mg  5 mg Oral Q6H PRN Mickie D. Adams, PA       Or  . haloperidol lactate (HALDOL) injection 5 mg  5 mg Intramuscular Q6H PRN Mickie D. Adams, PA      . haloperidol (HALDOL) tablet 5 mg  5 mg Oral BH-qamhs Curlene Labrum Shatori Bertucci, MD   5 mg at 06/15/11 0820  . LORazepam (ATIVAN) tablet 2 mg  2 mg Oral Q6H PRN Mickie D. Pernell Dupre, PA       Or  . LORazepam (ATIVAN) injection 2 mg  2 mg Intramuscular Q6H PRN Mickie D. Pernell Dupre, PA      . LORazepam (ATIVAN) tablet 1 mg  1 mg Oral Q8H PRN Verne Spurr, PA-C      . nicotine (NICODERM CQ - dosed in mg/24 hours) patch 21 mg  21 mg Transdermal Daily PRN Verne Spurr, PA-C      . DISCONTD: ciprofloxacin (CIPRO) tablet 500 mg  500 mg Oral BID Verne Spurr, PA-C   500 mg at 06/14/11 4098  . DISCONTD: divalproex (DEPAKOTE ER) 24 hr tablet 1,000 mg  1,000 mg Oral Daily Mickie D. Adams, PA   1,000 mg at 06/14/11 1191  . DISCONTD: risperiDONE (RISPERDAL M-TABS) disintegrating tablet 2 mg  2 mg Oral  QHS Verne Spurr, PA-C   2 mg at 06/13/11 2159   Lab Results: No results found for this or any previous visit (from the past 48 hour(s)).  Review of Systems:  Neurological: No headaches, seizures or dizziness reported.  G.I.: The patient denies any constipation or stomach upset today.  Musculoskeletal: The patient denies any musculoskeletal issues today.   Time was spent today discussing with the patient his current symptoms.  The patient remained moderately manic with significant flight of ideas and pressured speech.  The patient states that he is sleeping well but according to staff he slept approximately 4 1/2 hours.  The patient denies any depressive symptoms as well as any suicidal or homicidal  ideations.  He denies any auditory or visual hallucinations but continues to appear very grandiose in his presentation.  The patient continues to state that he does not require medications and have been cured by "herbal medicines."  Treatment Plan Summary:  1. Daily contact with patient to assess and evaluate symptoms and progress in treatment.  2. Medication management  3. The patient will deny suicidal ideations or homicidal ideations for 48 hours prior to discharge and have a depression and anxiety rating of 3 or less. The patient will also deny any auditory or visual hallucinations or delusional thinking.  4. The patient will deny any symptoms of substance withdrawal at time of discharge.   Plan:  1. Will continue the patient on his non-psychiatric medications.  2. Will continue the patient on the medication Depakote ER 1000 mgs po qhs for mood stabilization.  3. Will increase the medication Haldol to 5 mgs po qam and 10 mgs po qhs for psychosis.  4. Will continue the medication Cogentin at 1 mg po q am and hs for EPS.  5. Laboratory Studies reviewed.  6. Will order a serum Depakote Level for this evening to monitor his current Depakote dosage. 7. Will continue to monitor.  8. Will discontinue the 1 to 1 today.  Takela Varden 06/15/2011, 1:34 PM

## 2011-06-16 MED ORDER — DIVALPROEX SODIUM ER 500 MG PO TB24
1500.0000 mg | ORAL_TABLET | Freq: Every day | ORAL | Status: DC
  Administered 2011-06-16 – 2011-06-17 (×2): 1500 mg via ORAL
  Filled 2011-06-16: qty 3
  Filled 2011-06-16: qty 42
  Filled 2011-06-16: qty 3
  Filled 2011-06-16: qty 42
  Filled 2011-06-16: qty 3

## 2011-06-16 NOTE — Progress Notes (Signed)
[facility] Group Notes:  (Counselor/Nursing/MHT/Case Management/Adjunct)  06/16/2011 3:38 PM  Type of Therapy:  Group Therapy/Psychoeducation group  Participation Level:  Active  Participation Quality:  Attentive and Sharing  Affect:  Appropriate  Cognitive:  Oriented  Insight:  Limited  Engagement in Group:  Good  Engagement in Therapy:  Good  Modes of Intervention:  Education and Support  Summary of Progress/Problems: Patient was attentive to speaker from mental health association. Patient continues to want discharged. Talked to him about calling his sister. Expressed delusional thoughts.   [name] 06/16/2011, 3:38 PM

## 2011-06-16 NOTE — Progress Notes (Signed)
[facility] Group Notes:  (Counselor/Nursing/MHT/Case Management/Adjunct)  06/16/2011 3:36 PM  Type of Therapy:  Group Therapy 06/15/11  Participation Level:  Active  Participation Quality:  Attentive and Sharing  Affect:  Anxious  Cognitive:  Oriented  Insight:  Limited  Engagement in Group:  Good  Engagement in Therapy:  Good  Modes of Intervention:  Clarification and Support  Summary of Progress/Problems: Patient had difficulty hearing during group. He talked about how he was brought to [facility] unfairly. He stated that he doesn't need to work on his behavior, instead he needs to take care of his medical problems first. Requesting his discharge.   HartisAram Beecham 06/16/2011, 3:36 PM

## 2011-06-16 NOTE — Progress Notes (Signed)
Pt states he slept well, appetite is good, energy level is normal. Focus is good. Pt denies SI/HI, moderately manic, denies voices. Pt talks about "pain in penis", reported to [employer], [place]. Pt has hx of BPH and remains focused on the catheterization in the past. Pt wrote on his self inventory, "you say I have rights to appeal involuntary commitment within 10 days, I request an appeal in court." CM aware. Depakote level low, to increase this hs.

## 2011-06-16 NOTE — Progress Notes (Signed)
[facility] MD Progress Note  06/16/2011 10:53 AM Hospital Day: Diagnosis:  Axis I: Schizoaffective Disorder - Bipolar Type.   The patient was seen today and reports the following:   ADL's: Intact.  Sleep: The patient reports to sleeping well last night.  Appetite: The patient reports a good appetite today.   Mild>(1-10) >Severe  Hopelessness (1-10): 0  Depression (1-10): 0  Anxiety (1-10): 0   Suicidal Ideation: The patient denies any suicidal ideations today.  Plan: No  Intent: No  Means: No   Homicidal Ideation: The patient denies any homicidal ideations today.  Plan: No  Intent: No.  Means: No   General Appearance/Behavior: The patient remained significantly manic in his presentation with no insight into his illness. He remained minimally cooperative with this provider.  Eye Contact: Fair.  Speech: Increased in rate and volume with moderate pressuring of speech continuing today.  Motor Behavior: Mild to moderately agitated..  Level of Consciousness: Alert  Mental Status: Oriented x 3.  Mood: Irritable, aggravated, grandiose in his presentation  Affect: Moderately Expansive.  Anxiety Level: No anxiety reported today.  Thought Process: No insight into his illness. The patient appears to be responding to grandiose delusions which are continuing.  Thought Content: The patient denies any auditory or visual hallucinations today. He continues to appear grandiose and delusional.  Perception: The patient has no insight into his illness.  Judgment: Poor.  Insight: Poor.  Cognition: Oriented to person, place and time.  Sleep:  Number of Hours: 3.75    Vital Signs:Blood pressure 130/86, pulse 69, temperature 96.6 F (35.9 C), temperature source Oral, resp. rate 20, height 5\' 7"  (1.702 m), weight 75.297 kg (166 lb).  Current Medications: Current Facility-Administered Medications  Medication Dose Route Frequency Provider Last Rate Last Dose  . benztropine (COGENTIN) tablet 1 mg  1 mg  Oral BH-qamhs Curlene Labrum Readling, MD   1 mg at 06/16/11 0805  . ciprofloxacin (CIPRO) tablet 500 mg  500 mg Oral BH-qamhs Curlene Labrum Readling, MD   500 mg at 06/16/11 0805  . divalproex (DEPAKOTE ER) 24 hr tablet 1,000 mg  1,000 mg Oral Q2200 Curlene Labrum Readling, MD   1,000 mg at 06/15/11 2223  . haloperidol (HALDOL) tablet 10 mg  10 mg Oral QHS Curlene Labrum Readling, MD   10 mg at 06/15/11 2223  . haloperidol (HALDOL) tablet 5 mg  5 mg Oral Q6H PRN Mickie D. Adams, PA       Or  . haloperidol lactate (HALDOL) injection 5 mg  5 mg Intramuscular Q6H PRN Mickie D. Adams, PA      . haloperidol (HALDOL) tablet 5 mg  5 mg Oral Q breakfast Curlene Labrum Readling, MD   5 mg at 06/16/11 0805  . LORazepam (ATIVAN) tablet 2 mg  2 mg Oral Q6H PRN Mickie D. Pernell Dupre, PA       Or  . LORazepam (ATIVAN) injection 2 mg  2 mg Intramuscular Q6H PRN Mickie D. Pernell Dupre, PA      . LORazepam (ATIVAN) tablet 1 mg  1 mg Oral Q8H PRN Verne Spurr, PA-C      . nicotine (NICODERM CQ - dosed in mg/24 hours) patch 21 mg  21 mg Transdermal Daily PRN Verne Spurr, PA-C      . DISCONTD: haloperidol (HALDOL) tablet 5 mg  5 mg Oral BH-qamhs Curlene Labrum Readling, MD   5 mg at 06/15/11 0820   Lab Results:  Results for orders placed during the hospital encounter of  06/11/11 (from the past 48 hour(s))  VALPROIC ACID LEVEL     Status: Abnormal   Collection Time   06/15/11  7:50 PM      Component Value Range Comment   Valproic Acid Lvl 30.0 (*) 50.0 - 100.0 (ug/mL)     Review of Systems:  Neurological: No headaches, seizures or dizziness reported.  G.I.: The patient denies any constipation or stomach upset today.  Musculoskeletal: The patient denies any musculoskeletal issues today.   Time was spent today discussing with the patient his current symptoms.  The patient remained moderately manic with significant flight of ideas and pressured speech. He complains that his penis still hurts.  He is also questioning the "legality of his IVC admission" and wants  to speak with the [facility] about his rights,  The CM reports that the DA will be on the unit today and she will have him see Mr. Moone.  He still remains fairly guarded in his presentation and refuses to allow [place] to contact the [name] .  Treatment Plan Summary:  1. Daily contact with patient to assess and evaluate symptoms and progress in treatment.  2. Medication management  3. The patient will deny suicidal ideations or homicidal ideations for 48 hours prior to discharge and have a depression and anxiety rating of 3 or less. The patient will also deny any auditory or visual hallucinations or delusional thinking.  4. The patient will deny any symptoms of substance withdrawal at time of discharge.   Plan:  1. Will continue the patient on his non-psychiatric medications.  2. Will increase  the patient on the medication Depakote ER 1500 mgs po qhs for mood stabilization.  3. Will continue the Haldol to 5 mgs po qam and 10 mgs po qhs for psychosis.  4. Will continue the medication Cogentin at 1 mg po q am and hs for EPS.  5. Laboratory Studies reviewed and showed the Depakote to be below therapeutic level. 6. Will continue to monitor.   Kem Hensen 06/16/2011, 10:53 AM

## 2011-06-16 NOTE — Progress Notes (Signed)
Patient ID: [name], male   DOB: [birth year], 71 y.o.   MRN: [id number] Attempted call to patient's sister Corrie Dandy McGuire-[phone]). Voice mail was full but left call back number.

## 2011-06-16 NOTE — Discharge Planning (Signed)
Met with patient in Aftercare Planning Group.   He dominated much of the meeting with questions about the Involuntary Commitment process, and with complaints about why he is here.  He stated he does not understand the basis for his being petitioned for hospitalization.  Case Manager made a copy of the legal paperwork and provided to him.  Also explained to him that he will very likely discharge before a court date is set.  Patient feels overly sedated in the morning.  He is focused on healthy living so that he can live another 30 years to the age of 64.  Although he speaks with somewhat of a stutter, he is articulate and expresses his thoughts well.  He again adamantly refused to give permission for [place] to contact the [facility] about his medication history.  The public defender was here to see another patient and left prior to being informed by CM that this patient wanted to see the attorney; therefore, CM provided this patient with the telephone number to the PD office.  No case management needs other than those related above.  Adrian Mantle, LCSW 06/16/2011, 1:07 PM

## 2011-06-16 NOTE — Progress Notes (Signed)
Patient sat in his room during this assessment. He appeared blunted and depressed. He Hesitantly denied SI/HI and denied hallucinations. He later joined his peers to go outside. Writer encouraged patient to come to window for any concern or problem. Q 15 minute check continues to maintain safety.

## 2011-06-16 NOTE — Progress Notes (Signed)
06/16/2011         Time: 0930      Group Topic/Focus: The focus of this group is on enhancing the patient's understanding of leisure, barriers to leisure, and the importance of engaging in positive leisure activities upon discharge for improved total health.  Participation Level: Minimal  Participation Quality: Attentive  Affect: Appropriate  Cognitive: Oriented  Additional Comments: Patient very hard of hearing, was able to participate while RT was sitting next to him, speaking very loudly.  Neko Boyajian 06/16/2011 12:33 PM

## 2011-06-17 LAB — VALPROIC ACID LEVEL: Valproic Acid Lvl: 51.3 ug/mL (ref 50.0–100.0)

## 2011-06-17 NOTE — Progress Notes (Signed)
Pt continues to be isolative, not as grandiose, probable D/C on Friday. Pt denies SI/HI. Appetite ok. Cooperative, talking to peers and staff at times.

## 2011-06-17 NOTE — Progress Notes (Signed)
Currently resting quietly in bed with eyes closed. Respirations are even and unlabored. No acute distress or discomfort noted. Safety has been maintained with Q15 minute observation. Will continue current POC. 

## 2011-06-17 NOTE — Progress Notes (Signed)
[facility] MD Progress Note  06/17/2011 1:47 PM  Diagnosis:  Axis I: Schizoaffective Disorder - Bipolar Type.   The patient was seen today and reports the following:   ADL's: Intact.  Sleep: The patient reports to sleeping well last night.  Appetite: The patient reports a good appetite today.   Mild>(1-10) >Severe  Hopelessness (1-10): 0  Depression (1-10): 0  Anxiety (1-10): 0   Suicidal Ideation: The patient adamantly denies any suicidal ideations today.  Plan: No  Intent: No  Means: No   Homicidal Ideation: The patient adamantly denies any homicidal ideations today.  Plan: No  Intent: No.  Means: No   General Appearance/Behavior: The patient was more cooperative today and was friendly with this provider. Eye Contact: Good.  Speech: Slight increase in rate and volume with very mild pressuring of speech noted.  Motor Behavior: Mildly agitated..  Level of Consciousness: Alert and Oriented x 3.  Mental Status: Alert and Oriented x 3.  Mood: Essentially euthymic.  Affect: Slightly Expansive.  Anxiety Level: No anxiety reported today.  Thought Process: Sightly grandiose today.  Thought Content: The patient denies any auditory or visual hallucinations today. He continues to appear slightly grandiose and delusional.  Perception: The patient has little insight into his illness.  Judgment: Fair.  Insight: Poor.  Cognition: Oriented to person, place and time.  Sleep:  Number of Hours: 6.75    Vital Signs:Blood pressure 129/90, pulse 88, temperature 97.7 F (36.5 C), temperature source Oral, resp. rate 20, height 5\' 7"  (1.702 m), weight 75.297 kg (166 lb).  Current Medications: Current Facility-Administered Medications  Medication Dose Route Frequency Provider Last Rate Last Dose  . benztropine (COGENTIN) tablet 1 mg  1 mg Oral BH-qamhs Curlene Labrum Mika Griffitts, MD   1 mg at 06/17/11 0746  . ciprofloxacin (CIPRO) tablet 500 mg  500 mg Oral BH-qamhs Curlene Labrum Sweet Jarvis, MD   500 mg at 06/17/11  0747  . divalproex (DEPAKOTE ER) 24 hr tablet 1,500 mg  1,500 mg Oral Q2200 Verne Spurr, PA-C   1,500 mg at 06/16/11 2137  . haloperidol (HALDOL) tablet 10 mg  10 mg Oral QHS Curlene Labrum Chaze Hruska, MD   10 mg at 06/16/11 2137  . haloperidol (HALDOL) tablet 5 mg  5 mg Oral Q6H PRN Mickie D. Adams, PA       Or  . haloperidol lactate (HALDOL) injection 5 mg  5 mg Intramuscular Q6H PRN Mickie D. Adams, PA      . haloperidol (HALDOL) tablet 5 mg  5 mg Oral Q breakfast Curlene Labrum Danyla Wattley, MD   5 mg at 06/17/11 0746  . LORazepam (ATIVAN) tablet 2 mg  2 mg Oral Q6H PRN Mickie D. Pernell Dupre, PA       Or  . LORazepam (ATIVAN) injection 2 mg  2 mg Intramuscular Q6H PRN Mickie D. Pernell Dupre, PA      . LORazepam (ATIVAN) tablet 1 mg  1 mg Oral Q8H PRN Verne Spurr, PA-C      . nicotine (NICODERM CQ - dosed in mg/24 hours) patch 21 mg  21 mg Transdermal Daily PRN Verne Spurr, PA-C       Lab Results:  Results for orders placed during the hospital encounter of 06/11/11 (from the past 48 hour(s))  VALPROIC ACID LEVEL     Status: Abnormal   Collection Time   06/15/11  7:50 PM      Component Value Range Comment   Valproic Acid Lvl 30.0 (*) 50.0 - 100.0 (ug/mL)  Review of Systems:  Neurological: No headaches, seizures or dizziness reported.  G.I.: The patient denies any constipation or stomach upset today.  Musculoskeletal: The patient denies any musculoskeletal issues today.   Time was spent today discussing with the patient his current symptoms.  The patient states that he is sleeping well without difficulty and reports a good appetite.  He denies any significant feelings of sadness, anhedonia or depressed mood.  He adamantly denies any suicidal or homicidal ideations and states he wants to live to be [age].  The patient denies any auditory or visual hallucinations but does appear to be experiencing some mild delusions which are likely at baseline.  Staff on the unit who has worked with the patient before states he  is "back to himself" and is nearing baseline.  Treatment Plan Summary:  1. Daily contact with patient to assess and evaluate symptoms and progress in treatment.  2. Medication management  3. The patient will deny suicidal ideations or homicidal ideations for 48 hours prior to discharge and have a depression and anxiety rating of 3 or less. The patient will also deny any auditory or visual hallucinations or delusional thinking.  4. The patient will deny any symptoms of substance withdrawal at time of discharge.   Plan:  1. Will continue the patient on his non-psychiatric medications.  2. Will continue the patient on the medication Depakote ER 1500 mgs po qhs for mood stabilization.  3. Will continue the medication Haldol to 5 mgs po qam and 10 mgs po qhs for psychosis.  4. Will continue the medication Cogentin at 1 mg po q am and hs for EPS.  5. Laboratory Studies reviewed.  6. Will order a serum Depakote Level for this evening. 7. Will continue to monitor.   Lydia Meng 06/17/2011, 1:47 PM

## 2011-06-17 NOTE — Progress Notes (Signed)
[facility] Group Notes:  (Counselor/Nursing/MHT/Case Management/Adjunct)  06/17/2011 3:53 PM  Type of Therapy:  Group Therapy 9:30  Music Therapy 1:15  Participation Level:  Minimal  Participation Quality:  Attentive and Drowsy  Affect:  Blunted  Cognitive:  Oriented  Insight:  Limited  Engagement in Group:  Limited  Engagement in Therapy:  Limited  Modes of Intervention:  Activity, Clarification, Education and Support  Summary of Progress/Problems: Patient had difficulty participating due to his hearing difficulty. Not as talkative today. No delusional material discussed. In music therapy he stated he likes all kinds of music from rock and roll to symphony. Fell asleep during part of the group, but then work up and sang for the group.   [name] 06/17/2011, 3:53 PM

## 2011-06-17 NOTE — Discharge Planning (Signed)
Per State Regulation 482.30  This chart was reviewed for medical necessity with respect to the patient's Admission/Duration of stay.   Next review due:   06/20/11  Ambrose Mantle, LCSW  06/17/2011  2:01 PM

## 2011-06-17 NOTE — Progress Notes (Signed)
Patient in his room during this assessment. His thought process disorganized and he seemed angry and irritable. "I'm not here for suicide or voices". Mood and affect angry and irritable. Refused to answer most of the assessment questions. Writer encouraged patient to come to the window if any problem or concern. Q 15 minute check continues to maintain safety.

## 2011-06-18 MED ORDER — BENZTROPINE MESYLATE 1 MG PO TABS: 1.0000 mg | ORAL_TABLET | ORAL | Status: DC

## 2011-06-18 MED ORDER — HALOPERIDOL 5 MG PO TABS: ORAL_TABLET | ORAL | Status: DC

## 2011-06-18 MED ORDER — DIVALPROEX SODIUM ER 500 MG PO TB24: 1500.0000 mg | ORAL_TABLET | Freq: Every day | ORAL | Status: DC

## 2011-06-18 MED ORDER — CIPROFLOXACIN HCL 500 MG PO TABS: 500.0000 mg | ORAL_TABLET | ORAL | Status: AC

## 2011-06-18 NOTE — Progress Notes (Signed)
[facility] Case Management Discharge Plan:  Will you be returning to the same living situation after discharge: Yes,  living with sister/cousin/in his camping tents At discharge, do you have transportation home?:Yes,  given bus pass by Case Manager Do you have the ability to pay for your medications:Yes,  income and insurance  Interagency Information:     Release of information consent forms completed and in the chart;  Patient's signature needed at discharge.  Patient to Follow up at:  Follow-up Information    Schedule an appointment as soon as possible for a visit with [facility]. (You will receive a letter in 4-6 weeks informing you of your assigned Primary Care Provider.  If you need help before then, you are supposed to go to  the [facility]  Emergency Room)    Contact information:   [facility] [address] [place]  [address] [phone] Crisis Telephone:  [phone]         Patient denies SI/HI:   Yes,      Safety Planning and Suicide Prevention discussed:  Yes,  During Aftercare Planning Group, Case Manager provided psychoeducation on "Suicide Prevention Information."  This included descriptions of risk factors for suicide, warning signs that an individual is in crisis and thinking of suicide, and what to do if this occurs.    Barrier to discharge identified:No.  Summary and Recommendations:  Patient needs a Primary Care Physician with the [facility] prior to getting Mental Health provider, as the PCP must do the referral.  The process of getting a PCP has been initiated, and patient needs to follow up when he gets the letter telling him the name of his PCP.   Sarina Ser 06/18/2011, 11:25 AM

## 2011-06-18 NOTE — Progress Notes (Signed)
Patient ID: [name], male   DOB: May 19, 1940, 71 y.o.   MRN: [id number] Pt discharged this afternoon, pt provided with supply of medications and prescriptions, pt denies SI/HI, pt provided with bus pass, pt provided with discharge instructions and pt verbalized understanding, all belongings returned

## 2011-06-18 NOTE — Tx Team (Signed)
Interdisciplinary Treatment Plan Update (Adult)  Date:  06/18/2011  Time Reviewed:  10:15AM-11:15AM  Progress in Treatment: Attending groups:  Yes Participating in groups:    Yes,when asked direct questions -- is hard of hearing, so does not attend to much that is not addressed to him directly Taking medication as prescribed:    Yes Tolerating medication:   Yes Family/Significant other contact made:  Yes Patient understands diagnosis:   Yes, limited insight Discussing patient identified problems/goals with staff:   Yes Medical problems stabilized or resolved:   Yes Denies suicidal/homicidal ideation:  Yes Issues/concerns per patient self-inventory:   None Other:    New problem(s) identified: No, Describe:    Reason for Continuation of Hospitalization: None  Interventions implemented related to continuation of hospitalization:  Medication monitoring and adjustment, safety checks Q15 min., suicide risk assessment, group therapy, psychoeducation, collateral contact, aftercare planning, ongoing physician assessments, medication education - Until Discharge  Additional comments:  Not applicable  Estimated length of stay:  Discharge today  Discharge Plan:  Go to sister's house, likely will spend time camping out, follow up at [facility].  Was not already receiving services there, so Case Manager signed him up for a Primary Care Physician.  He will receive a letter with that assigned doctor in approximately 4-6 weeks, and if he needs help before then is supposed to go to the [facility] or any other close hospital if it is a true medical emergency.  Therefore, we will give him a 14-day supply of medications and a 30-day script.  New goal(s):  Not applicable  Review of initial/current patient goals per problem list:   1.  Goal(s):  Determine placement for patient.  Met:  Yes  Target date:  By Discharge   As evidenced by:  Will go to sister's.  Spends time at  cousin's and also spends time camping out.  2.  Goal(s):  Reduce psychotic symptoms to baseline  Met:  Yes  Target date:  By Discharge   As evidenced by:  Appears to be somewhat eccentric, but no psychosis noted  3.  Goal(s):  Determine what patient's baseline is through family contact  Met:  Yes  Target date:  By Discharge   As evidenced by:  Patient appears to be at baseline.  4.  Goal(s):  Be sufficiently safe to come off 1:1 at least 24 hours prior to D/C.  Met:  Yes  Target date:  By Discharge   As evidenced by:  Accomplished several days ago  5.  Goal(s):  Reduce paranoia to baseline per patient and family.   Met:  Yes  Target date:  By Discharge   As evidenced by:  Is at baseline per patient and family contact.  6.  Goal(s):  Reduce hyper-religiosity to baseline per patient and family.  Met:  Yes  Target date:  By Discharge   As evidenced by:  While still religiously focused, appears to be at baseline per patient and family report.   Attendees: Patient:  [name]  06/18/2011 10:15AM-11:15AM  Family:     Physician:  Dr. Harvie Heck Readling 06/18/2011 10:15AM-11:15AM  Nursing:   Tacy Learn, RN 06/18/2011 10:15AM -11:15AM   Case Manager:  Ambrose Mantle, LCSW 06/18/2011 10:15AM-11:15AM  Counselor:  Veto Kemps, MT-BC 06/18/2011 10:15AM-11:15AM  Other:   Verne Spurr, PA 06/18/2011 10:15AM-11:15AM  Other:   Shelda Jakes, RN 06/18/2011 10:15AM-11:15AM  Other:      Other:  Scribe for Treatment Team:   Sarina Ser, 06/18/2011, 10:15AM-11:15AM

## 2011-06-18 NOTE — Progress Notes (Signed)
[facility] Group Notes:  (Counselor/Nursing/MHT/Case Management/Adjunct)  06/18/2011 2:33 PM  Type of Therapy:  Group Therapy  Participation Level:  Did Not Attend D/C    [name] 06/18/2011, 2:33 PM

## 2011-06-18 NOTE — [facility] Suicide Risk Assessment (Signed)
Suicide Risk Assessment  Discharge Assessment     Demographic factors:   Living alone  Current Mental Status Per Nursing Assessment::   On Admission:   At Discharge:  The patient was AO x 3.  He denies any significant feelings of sadness, anhedonia or depressed mood.  He also denies any suicidal or homicidal ideations.  The patient denies any auditory or visual hallucinations but continues to voice some residual delusional thoughts related to health food and his penis.  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   The patient was seen today and reports the following:   ADL's: Intact.  Sleep: The patient reports to sleeping well last night.  Appetite: The patient reports a good appetite today.   Mild>(1-10) >Severe  Hopelessness (1-10): 0  Depression (1-10): 0  Anxiety (1-10): 0   Suicidal Ideation: The patient adamantly denies any suicidal ideations today.  Plan: No  Intent: No  Means: No  Homicidal Ideation: The patient adamantly denies any homicidal ideations today.  Plan: No  Intent: No.  Means: No   General Appearance/Behavior: The patient was friendly and cooperative today with this provider.  Eye Contact: Good.  Speech: Slight increase in volume but with no pressuring of speech noted.  Motor Behavior: wnl.  Level of Consciousness: Alert and Oriented x 3.  Mental Status: Alert and Oriented x 3.  Mood: Essentially euthymic.  Affect: Bright and Full.  Anxiety Level: No anxiety reported today.  Thought Process: Sight residual grandiose thinking today.  Thought Content: The patient denies any auditory or visual hallucinations today. He continues to appear slightly grandiose and delusional in his thinking.  Perception:  Sight residual grandiose thinking today.  Judgment: Fair.  Insight: Fair.  Cognition: Oriented to person, place and time.   Loss Factors: Recent Change in Health Status.  Historical  Factors: History of Mental Illness.  History of Non-compliance with medications.  Risk Reduction Factors:   Positive social support;Positive therapeutic relationship  Continued Clinical Symptoms:  Previous Psychiatric Diagnoses and Treatments Medical Diagnoses and Treatments/Surgeries Schizoaffective Disorder - Bipolar Type.  Discharge Diagnoses:   AXIS I:   Schizoaffective Disorder - Bipolar Type.  AXIS II:   Deferred. AXIS III:   1.  Hepatitis C.   2. Benign Prostatic Hypertrophy. AXIS IV:   Chronic Mental Illness.  Chronic Non-psychiatric Medical Issues.  History of Non-compliance with Medications. AXIS V:   GAF at time of admission approximately 30.  GAF at time of discharge approximately 50.  Cognitive Features That Contribute To Risk:  Thought constriction (tunnel vision)    Review of Systems:  Neurological: No headaches, seizures or dizziness reported.  G.I.: The patient denies any constipation or stomach upset today.  Musculoskeletal: The patient denies any musculoskeletal issues today.  GU:  The patient reports some pain with urination x 1 year which is followed at the [name].  Time was spent today discussing with the patient his current symptoms. The patient states that he is sleeping well without difficulty and reports a good appetite. He denies any significant feelings of sadness, anhedonia or depressed mood. He adamantly denies any suicidal or homicidal ideations.  The patient denies any auditory or visual hallucinations but does appear to be experiencing some mild residual delusions and grandiose thinking which are likely at baseline.  The patient requested discharge today and this will be ordered.   Treatment Plan Summary:  1. Daily contact with patient to assess and evaluate  symptoms and progress in treatment.  2. Medication management  3. The patient will deny suicidal ideations or homicidal ideations for 48 hours prior to discharge and have a depression and  anxiety rating of 3 or less. The patient will also deny any auditory or visual hallucinations or delusional thinking.  4. The patient will deny any symptoms of substance withdrawal at time of discharge.   Plan:  1. Will continue the patient on his non-psychiatric medications.  2. Will continue the patient on the medication Depakote ER 1500 mgs po qhs for mood stabilization.  3. Will continue the medication Haldol to 5 mgs po qam and 10 mgs po qhs for psychosis.  4. Will continue the medication Cogentin at 1 mg po q am and hs for EPS.  5. Laboratory Studies reviewed.  6. Will continue to monitor.   Suicide Risk:  Minimal: No identifiable suicidal ideation.  Patients presenting with no risk factors but with morbid ruminations; may be classified as minimal risk based on the severity of the depressive symptoms  Plan Of Care/Follow-up recommendations:  Activity:  As Tolerated. Diet:  Regular Diet. Other:  Please take all medications only as directed and keep your scheduled follow up appointments with the [facility].  [name] 06/18/2011, 1:29 PM

## 2011-06-18 NOTE — Discharge Summary (Signed)
Physician Discharge Summary Note  Patient:  [name] is an 71 y.o., male MRN:  [id number] DOB:  Jul 30, 1940 Patient phone:  [phone] (home)  Patient address:   [address] 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  AXIS II: Deferred.  AXIS III: 1. Hepatitis C.                 2. Benign Prostatic Hypertrophy.  AXIS IV: Chronic Mental Illness. Chronic Non-psychiatric Medical Issues. History of Non-compliance with Medications.  AXIS V: GAF at time of admission approximately 30. GAF at time of discharge approximately 50.   Level of Care:  OP  Hospital Course:  [name] was admitted via IVC for altered mental status and delusions.  He had presented to the [facility] complaining of dysuria but only when he was in [place].  He was admitted for crisis management and stabilization.  [name] was treated for a urinary tract infection while he was at [facility] with Cipro based on the abnormal urine sample provided in the ED.  He was placed on Cogentin, Depakote ER, and Haldol. He was also given ativan for agitation.  He was initially resistant to treatment and refused medications, but decided to do so without difficulty. [name] did respond to his medications, and became less grandiose, but still has some residual fixed delusions which pertain to his health. On his 6th day of admission it was felt that [name] was nearing his base line level of functioning and plans for discharge were started. He was scheduled to follow up at the [facility] for his refills as well as his urinary issues.  He was seen again by the treatment team on the day of discharge.  He denied suicidal ideations, homicidal  ideations, AH/VH, and reported no depression.  All felt that he was safe for discharge and that his plan for follow up was good.  Consults:  None  Significant Diagnostic Studies:  labs: see labs on admission.  Discharge Vitals:   Blood pressure 119/80, pulse 86, temperature 97.7 F (36.5 C), temperature source Oral, resp. rate 16, height 5\' 7"  (1.702 m), weight 75.297 kg (166 lb).  Mental Status Exam: See Mental Status Examination and Suicide Risk Assessment completed by Attending Physician prior to discharge.  Discharge destination:  Home  Is patient on multiple antipsychotic therapies at discharge:  No   Has Patient had three or more failed trials of antipsychotic monotherapy by history:  no  Recommended Plan for Multiple Antipsychotic Therapies: Not applicable   Discharge Orders    Future Orders Please Complete By Expires   Diet - low sodium heart healthy      Increase activity slowly      Discharge instructions      Comments:   Take all medications as prescribed, keep all follow up appointments to get your medication refilled.     Medication List  As of 06/18/2011  9:27 AM   TAKE these medications      Indication    benztropine 1 MG tablet   Commonly known as: COGENTIN   Take 1 tablet (1 mg total) by mouth 2 (two) times daily in the am and at bedtime.. To prevent side effects.    Indication: Extrapyramidal Reaction  caused by Medications      ciprofloxacin 500 MG tablet   Commonly known as: CIPRO   Take 1 tablet (500 mg total) by mouth 2 (two) times daily in the am and at bedtime.. For uti.    Indication: Infection of Urinary Tract with Complications      divalproex 500 MG 24 hr tablet   Commonly known as: DEPAKOTE ER   Take 3 tablets (1,500 mg total) by mouth daily at 10 pm. For mood stabilization.    Indication: mood stabilization.      haloperidol 5 MG tablet   Commonly known as: HALDOL   Take one tablet by mouth at 8AM and 2(two) at bed time for psychosis and  mental clarity.    Indication: Psychosis           Follow-up Information    Schedule an appointment as soon as possible for a visit with [facility]. (You will receive a letter in 4-6 weeks informing you of your assigned Primary Care Provider.  If you need help before then, you are supposed to go to  the [facility])    Contact information:   [facility] [address] [place]  [address] [phone] Crisis Telephone:  [phone]         Follow-up recommendations:  Follow up as noted above.  Prescriptions and samples provided.  Comments:  Heart healthy diet, regular exercise, plenty of sleep.  Signed:  Rona Ravens. Calden Dorsey PAC For Dr. Harvie Heck D. Readling 06/18/2011, 9:27 AM

## 2011-06-24 NOTE — Progress Notes (Signed)
Patient Discharge Instructions:  After Visit Summary (AVS):   Faxed to:  06/22/2011 Psychiatric Admission Assessment Note:   Faxed to:  06/22/2011 Suicide Risk Assessment - Discharge Assessment:   Faxed to:  06/22/2011 Faxed/Sent to the Next Level Care provider:  06/22/2011  Faxed to [facility] Administration @ [phone]  Wandra Scot, 06/24/2011, 12:54 PM

## 2012-05-01 ENCOUNTER — Emergency Department (HOSPITAL_COMMUNITY): Payer: Medicare Other

## 2012-05-01 ENCOUNTER — Inpatient Hospital Stay (HOSPITAL_COMMUNITY)
Admission: EM | Admit: 2012-05-01 | Discharge: 2012-05-06 | DRG: 378 | Disposition: A | Discharge status: Home / Self Care | Payer: Medicare Other | Attending: Internal Medicine | Admitting: Internal Medicine

## 2012-05-01 ENCOUNTER — Other Ambulatory Visit: Payer: Self-pay

## 2012-05-01 ENCOUNTER — Encounter (HOSPITAL_COMMUNITY): Payer: Self-pay | Admitting: *Deleted

## 2012-05-01 DIAGNOSIS — N390 Urinary tract infection, site not specified: Secondary | ICD-10-CM | POA: Diagnosis present

## 2012-05-01 DIAGNOSIS — F250 Schizoaffective disorder, bipolar type: Secondary | ICD-10-CM

## 2012-05-01 DIAGNOSIS — Z87891 Personal history of nicotine dependence: Secondary | ICD-10-CM

## 2012-05-01 DIAGNOSIS — Z5900 Homelessness unspecified: Secondary | ICD-10-CM

## 2012-05-01 DIAGNOSIS — F200 Paranoid schizophrenia: Secondary | ICD-10-CM | POA: Diagnosis present

## 2012-05-01 DIAGNOSIS — D509 Iron deficiency anemia, unspecified: Secondary | ICD-10-CM | POA: Diagnosis present

## 2012-05-01 DIAGNOSIS — F319 Bipolar disorder, unspecified: Secondary | ICD-10-CM | POA: Diagnosis present

## 2012-05-01 DIAGNOSIS — Z9119 Patient's noncompliance with other medical treatment and regimen: Secondary | ICD-10-CM

## 2012-05-01 DIAGNOSIS — B1920 Unspecified viral hepatitis C without hepatic coma: Secondary | ICD-10-CM | POA: Diagnosis present

## 2012-05-01 DIAGNOSIS — N400 Enlarged prostate without lower urinary tract symptoms: Secondary | ICD-10-CM | POA: Diagnosis present

## 2012-05-01 DIAGNOSIS — B957 Other staphylococcus as the cause of diseases classified elsewhere: Secondary | ICD-10-CM | POA: Diagnosis present

## 2012-05-01 DIAGNOSIS — D649 Anemia, unspecified: Secondary | ICD-10-CM | POA: Diagnosis present

## 2012-05-01 DIAGNOSIS — N179 Acute kidney failure, unspecified: Secondary | ICD-10-CM | POA: Diagnosis present

## 2012-05-01 DIAGNOSIS — K922 Gastrointestinal hemorrhage, unspecified: Principal | ICD-10-CM | POA: Diagnosis present

## 2012-05-01 DIAGNOSIS — Z91199 Patient's noncompliance with other medical treatment and regimen due to unspecified reason: Secondary | ICD-10-CM

## 2012-05-01 HISTORY — DX: Urinary tract infection, site not specified: N39.0

## 2012-05-01 LAB — RAPID URINE DRUG SCREEN, HOSP PERFORMED: Opiates: NOT DETECTED

## 2012-05-01 LAB — COMPREHENSIVE METABOLIC PANEL
ALT: 20 U/L (ref 0–53)
Alkaline Phosphatase: 50 U/L (ref 39–117)
BUN: 25 mg/dL — ABNORMAL HIGH (ref 6–23)
CO2: 22 mEq/L (ref 19–32)
Chloride: 108 mEq/L (ref 96–112)
GFR calc Af Amer: 46 mL/min — ABNORMAL LOW (ref >90)
GFR calc non Af Amer: 40 mL/min — ABNORMAL LOW (ref >90)
Glucose, Bld: 108 mg/dL — ABNORMAL HIGH (ref 70–99)
Potassium: 3.9 mEq/L (ref 3.5–5.1)
Sodium: 139 mEq/L (ref 135–145)
Total Bilirubin: 0.4 mg/dL (ref 0.3–1.2)
Total Protein: 6 g/dL (ref 6.0–8.3)

## 2012-05-01 LAB — CBC
MCH: 31.2 pg (ref 26.0–34.0)
MCHC: 34.2 g/dL (ref 30.0–36.0)
RDW: 16.6 % — ABNORMAL HIGH (ref 11.5–15.5)

## 2012-05-01 LAB — URINALYSIS, ROUTINE W REFLEX MICROSCOPIC
Glucose, UA: NEGATIVE mg/dL
Nitrite: POSITIVE — AB
Specific Gravity, Urine: 1.018 (ref 1.005–1.030)
pH: 5.5 (ref 5.0–8.0)

## 2012-05-01 LAB — HEMOGLOBIN AND HEMATOCRIT, BLOOD
HCT: 16.1 % — ABNORMAL LOW (ref 39.0–52.0)
Hemoglobin: 5.5 g/dL — CL (ref 13.0–17.0)

## 2012-05-01 LAB — SALICYLATE LEVEL: Salicylate Lvl: <2 mg/dL — ABNORMAL LOW (ref 2.8–20.0)

## 2012-05-01 LAB — ACETAMINOPHEN LEVEL: Acetaminophen (Tylenol), Serum: <15 ug/mL (ref 10–30)

## 2012-05-01 LAB — URINE MICROSCOPIC-ADD ON

## 2012-05-01 LAB — ETHANOL: Alcohol, Ethyl (B): <11 mg/dL (ref 0–11)

## 2012-05-01 LAB — PREPARE RBC (CROSSMATCH)

## 2012-05-01 LAB — PROTIME-INR: Prothrombin Time: 13.7 seconds (ref 11.6–15.2)

## 2012-05-01 MED ORDER — SODIUM CHLORIDE 0.9 % IV SOLN: INTRAVENOUS | Status: DC

## 2012-05-01 MED ORDER — ONDANSETRON HCL 4 MG/2ML IJ SOLN
4.0000 mg | Freq: Three times a day (TID) | INTRAMUSCULAR | Status: DC | PRN
  Administered 2012-05-02: 4 mg via INTRAVENOUS
  Filled 2012-05-01: qty 2

## 2012-05-01 MED ORDER — DEXTROSE 5 % IV SOLN
1.0000 g | Freq: Once | INTRAVENOUS | Status: AC
  Administered 2012-05-01: 1 g via INTRAVENOUS
  Filled 2012-05-01: qty 10

## 2012-05-01 MED ORDER — SODIUM CHLORIDE 0.9 % IV BOLUS (SEPSIS)
1000.0000 mL | Freq: Once | INTRAVENOUS | Status: AC
  Administered 2012-05-01: 1000 mL via INTRAVENOUS

## 2012-05-01 MED ORDER — PANTOPRAZOLE SODIUM 40 MG IV SOLR
40.0000 mg | Freq: Once | INTRAVENOUS | Status: AC
  Administered 2012-05-01: 40 mg via INTRAVENOUS
  Filled 2012-05-01: qty 40

## 2012-05-01 NOTE — ED Notes (Addendum)
Pt c/o CP x 1.5 weeks and kidney stones, states he was supposed to go to surgery for this.  Also stated at nurse first, that he needed to talk with a doctor because he needed to go back to [facility].  Pt denies SI/HI, or hearing voices.

## 2012-05-01 NOTE — ED Provider Notes (Signed)
History     CSN: [id number]  Arrival date & time 05/01/12  1952   First MD Initiated Contact with Patient 05/01/12 2235      Chief Complaint  Patient presents with  . Chest Pain  . Medical Clearance    (Consider location/radiation/quality/duration/timing/severity/associated sxs/prior treatment) HPI Comments: Patient difficult historian. History of schizophrenia. States he's had diffuse abdominal and chest pain for the past one half weeks. States he has kidney stones he is supposed to have surgery. Denies nausea or vomiting. States his stool is black but he denies any blood in it. He has extensive discussion about how he got a special enema to make sure he didn't have any blood. He refuses a rectal exam.  The history is provided by the patient.    Past Medical History  Diagnosis Date  . Schizophrenia   . Hepatitis C   . Benign hypertrophy of prostate   . Urinary tract infection     Past Surgical History  Procedure Laterality Date  . Knee surgery      right    History reviewed. No pertinent family history.  History  Substance Use Topics  . Smoking status: Former Smoker -- 1.00 packs/day for 20 years    Quit date: 06/11/1975  . Smokeless tobacco: Not on file  . Alcohol Use: No      Review of Systems  Unable to perform ROS: Psychiatric disorder  Cardiovascular: Positive for chest pain.    Allergies  Review of patient's allergies indicates no known allergies.  Home Medications   Current Outpatient Rx  Name  Route  Sig  Dispense  Refill  . phenazopyridine (PYRIDIUM) 95 MG tablet   Oral   Take 95 mg by mouth 3 (three) times daily as needed for pain.           BP 103/83  Pulse 110  Temp(Src) 99.1 F (37.3 C) (Oral)  Resp 20  SpO2 99%  Physical Exam  Constitutional: He is oriented to person, place, and time. He appears well-developed and well-nourished. No distress.  HENT:  Head: Normocephalic and atraumatic.  Mouth/Throat: Oropharynx is clear and  moist.  Eyes: Conjunctivae and EOM are normal. Pupils are equal, round, and reactive to light.  Neck: Normal range of motion. Neck supple.  Cardiovascular: Normal rate, regular rhythm and normal heart sounds.   Tachycardic  Pulmonary/Chest: Effort normal and breath sounds normal. No respiratory distress.  Abdominal: Soft. There is no tenderness. There is no rebound and no guarding.  Musculoskeletal: Normal range of motion. He exhibits no edema and no tenderness.  Neurological: He is alert and oriented to person, place, and time. No cranial nerve deficit. He exhibits normal muscle tone. Coordination normal.  Skin: Skin is warm.    ED Course  Procedures (including critical care time)  Labs Reviewed  URINALYSIS, ROUTINE W REFLEX MICROSCOPIC - Abnormal; Notable for the following:    APPearance HAZY (*)    Nitrite POSITIVE (*)    Leukocytes, UA MODERATE (*)    All other components within normal limits  SALICYLATE LEVEL - Abnormal; Notable for the following:    Salicylate Lvl <2.0 (*)    All other components within normal limits  COMPREHENSIVE METABOLIC PANEL - Abnormal; Notable for the following:    Glucose, Bld 108 (*)    BUN 25 (*)    Creatinine, Ser 1.68 (*)    Albumin 3.0 (*)    GFR calc non Af Amer 40 (*)    GFR calc  Af Amer 46 (*)    All other components within normal limits  CBC - Abnormal; Notable for the following:    WBC 13.9 (*)    RBC 1.70 (*)    Hemoglobin 5.3 (*)    HCT 15.5 (*)    RDW 16.6 (*)    All other components within normal limits  URINE MICROSCOPIC-ADD ON - Abnormal; Notable for the following:    Bacteria, UA MANY (*)    All other components within normal limits  HEMOGLOBIN AND HEMATOCRIT, BLOOD - Abnormal; Notable for the following:    Hemoglobin 5.5 (*)    HCT 16.1 (*)    All other components within normal limits  URINE CULTURE  ACETAMINOPHEN LEVEL  ETHANOL  URINE RAPID DRUG SCREEN (HOSP PERFORMED)  PROTIME-INR  TYPE AND SCREEN  PREPARE RBC  (CROSSMATCH)  ABO/RH   No results found.   1. Anemia   2. GI bleed       MDM  2 week history of chest pain abdominal pain and black stools. Found to be severely anemic at 5.4. Patient refuses rectal exam.  Given IV fluids, IV protonix, IV Rocephin for UTI. Will obtain CT scan to rule out infected kidney stone given patient's history of stones.  Patient will be given packed red blood cells. Discussed the GI Dr. Ewing Schlein will place patient on list for evaluation tomorrow. Anemia likely contributing to chest pain and SOB.  Admission discussed with Dr. Kerry Hough.    Date: 05/01/2012  Rate: 121  Rhythm: sinus tachycardia  QRS Axis: normal  Intervals: normal  ST/T Wave abnormalities: nonspecific ST/T changes  Conduction Disutrbances:none  Narrative Interpretation:   Old EKG Reviewed: unchanged    Glynn Octave, MD 05/02/12 [phone]

## 2012-05-02 ENCOUNTER — Encounter (HOSPITAL_COMMUNITY): Payer: Self-pay | Admitting: Emergency Medicine

## 2012-05-02 DIAGNOSIS — F259 Schizoaffective disorder, unspecified: Secondary | ICD-10-CM

## 2012-05-02 DIAGNOSIS — D649 Anemia, unspecified: Secondary | ICD-10-CM

## 2012-05-02 DIAGNOSIS — Z9119 Patient's noncompliance with other medical treatment and regimen: Secondary | ICD-10-CM

## 2012-05-02 DIAGNOSIS — D509 Iron deficiency anemia, unspecified: Secondary | ICD-10-CM | POA: Diagnosis present

## 2012-05-02 DIAGNOSIS — Z590 Homelessness: Secondary | ICD-10-CM

## 2012-05-02 DIAGNOSIS — K922 Gastrointestinal hemorrhage, unspecified: Secondary | ICD-10-CM | POA: Diagnosis present

## 2012-05-02 DIAGNOSIS — N179 Acute kidney failure, unspecified: Secondary | ICD-10-CM

## 2012-05-02 LAB — COMPREHENSIVE METABOLIC PANEL
ALT: 17 U/L (ref 0–53)
Alkaline Phosphatase: 45 U/L (ref 39–117)
CO2: 19 mEq/L (ref 19–32)
Calcium: 8.4 mg/dL (ref 8.4–10.5)
GFR calc Af Amer: 49 mL/min — ABNORMAL LOW (ref >90)
GFR calc non Af Amer: 42 mL/min — ABNORMAL LOW (ref >90)
Glucose, Bld: 114 mg/dL — ABNORMAL HIGH (ref 70–99)
Potassium: 3.7 mEq/L (ref 3.5–5.1)
Sodium: 141 mEq/L (ref 135–145)
Total Bilirubin: 1.3 mg/dL — ABNORMAL HIGH (ref 0.3–1.2)

## 2012-05-02 LAB — IRON AND TIBC
Saturation Ratios: 45 % (ref 20–55)
UIBC: 137 ug/dL (ref 125–400)

## 2012-05-02 LAB — CBC
Hemoglobin: 7.2 g/dL — ABNORMAL LOW (ref 13.0–17.0)
Hemoglobin: 7.5 g/dL — ABNORMAL LOW (ref 13.0–17.0)
MCH: 31.3 pg (ref 26.0–34.0)
Platelets: 308 10*3/uL (ref 150–400)
RBC: 2.3 MIL/uL — ABNORMAL LOW (ref 4.22–5.81)
RBC: 2.43 MIL/uL — ABNORMAL LOW (ref 4.22–5.81)
WBC: 11.1 10*3/uL — ABNORMAL HIGH (ref 4.0–10.5)

## 2012-05-02 LAB — MRSA PCR SCREENING: MRSA by PCR: POSITIVE — AB

## 2012-05-02 LAB — FOLATE: Folate: 13 ng/mL

## 2012-05-02 LAB — RETICULOCYTES: Retic Ct Pct: 10.8 % — ABNORMAL HIGH (ref 0.4–3.1)

## 2012-05-02 LAB — FERRITIN: Ferritin: 138 ng/mL (ref 22–322)

## 2012-05-02 MED ORDER — HALOPERIDOL LACTATE 5 MG/ML IJ SOLN
5.0000 mg | Freq: Two times a day (BID) | INTRAMUSCULAR | Status: DC
  Filled 2012-05-02 (×8): qty 1

## 2012-05-02 MED ORDER — BENZTROPINE MESYLATE 1 MG/ML IJ SOLN
1.0000 mg | Freq: Two times a day (BID) | INTRAMUSCULAR | Status: DC
  Filled 2012-05-02 (×9): qty 1

## 2012-05-02 MED ORDER — HALOPERIDOL LACTATE 5 MG/ML IJ SOLN: 5.0000 mg | Freq: Four times a day (QID) | INTRAMUSCULAR | Status: DC | PRN

## 2012-05-02 MED ORDER — LORAZEPAM 1 MG PO TABS
1.0000 mg | ORAL_TABLET | Freq: Two times a day (BID) | ORAL | Status: DC
  Administered 2012-05-02 – 2012-05-06 (×8): 1 mg via ORAL
  Filled 2012-05-02 (×5): qty 1
  Filled 2012-05-02: qty 2
  Filled 2012-05-02: qty 1

## 2012-05-02 MED ORDER — ONDANSETRON HCL 4 MG PO TABS: 4.0000 mg | ORAL_TABLET | Freq: Four times a day (QID) | ORAL | Status: DC | PRN

## 2012-05-02 MED ORDER — DEXTROSE 5 % IV SOLN
1.0000 g | INTRAVENOUS | Status: DC
  Administered 2012-05-02 – 2012-05-03 (×2): 1 g via INTRAVENOUS
  Filled 2012-05-02 (×3): qty 10

## 2012-05-02 MED ORDER — SODIUM CHLORIDE 0.9 % IV SOLN
INTRAVENOUS | Status: DC
  Administered 2012-05-03: 02:00:00 via INTRAVENOUS
  Administered 2012-05-04: 75 mL/h via INTRAVENOUS

## 2012-05-02 MED ORDER — MUPIROCIN 2 % EX OINT
1.0000 "application " | TOPICAL_OINTMENT | Freq: Two times a day (BID) | CUTANEOUS | Status: DC
  Administered 2012-05-02 – 2012-05-05 (×7): 1 via NASAL
  Filled 2012-05-02 (×2): qty 22

## 2012-05-02 MED ORDER — PANTOPRAZOLE SODIUM 40 MG IV SOLR
40.0000 mg | Freq: Two times a day (BID) | INTRAVENOUS | Status: DC
  Administered 2012-05-02 – 2012-05-04 (×5): 40 mg via INTRAVENOUS
  Filled 2012-05-02 (×6): qty 40

## 2012-05-02 MED ORDER — ONDANSETRON HCL 4 MG/2ML IJ SOLN: 4.0000 mg | Freq: Four times a day (QID) | INTRAMUSCULAR | Status: DC | PRN

## 2012-05-02 MED ORDER — LORAZEPAM 2 MG/ML IJ SOLN: 1.0000 mg | Freq: Two times a day (BID) | INTRAMUSCULAR | Status: DC

## 2012-05-02 MED ORDER — HALOPERIDOL LACTATE 5 MG/ML IJ SOLN
5.0000 mg | Freq: Four times a day (QID) | INTRAMUSCULAR | Status: DC | PRN
  Administered 2012-05-02: 5 mg via INTRAVENOUS
  Filled 2012-05-02: qty 1

## 2012-05-02 MED ORDER — HALOPERIDOL 5 MG PO TABS
5.0000 mg | ORAL_TABLET | Freq: Two times a day (BID) | ORAL | Status: DC
  Administered 2012-05-02 – 2012-05-06 (×8): 5 mg via ORAL
  Filled 2012-05-02 (×9): qty 1

## 2012-05-02 MED ORDER — CHLORHEXIDINE GLUCONATE CLOTH 2 % EX PADS
6.0000 | MEDICATED_PAD | Freq: Every day | CUTANEOUS | Status: DC
  Administered 2012-05-03 – 2012-05-06 (×3): 6 via TOPICAL

## 2012-05-02 MED ORDER — ACETAMINOPHEN 325 MG PO TABS: 650.0000 mg | ORAL_TABLET | Freq: Four times a day (QID) | ORAL | Status: DC | PRN

## 2012-05-02 MED ORDER — ACETAMINOPHEN 650 MG RE SUPP: 650.0000 mg | Freq: Four times a day (QID) | RECTAL | Status: DC | PRN

## 2012-05-02 MED ORDER — BENZTROPINE MESYLATE 1 MG PO TABS
1.0000 mg | ORAL_TABLET | Freq: Two times a day (BID) | ORAL | Status: DC
  Administered 2012-05-02 – 2012-05-06 (×8): 1 mg via ORAL
  Filled 2012-05-02 (×9): qty 1

## 2012-05-02 NOTE — Progress Notes (Signed)
Nurse walked past pt room, pt was yelling out stating "he wanted to talk to his attorney about his human rights" ,l "I dont understand why I have to get blood when I haven't put anything in my body for 60 years".  Nurse attempted to calm pt down; pt then got out of bed demanded to speak to his attorney.  Nurse informed pt that she could have the social worker come by and talk with the pt.  Pt refused to calm down.  Pt refused administration of PRN Haldol.  Nurse contacted MD.  MD instructed nurse to contact security to facilitate  administration of  PRN.  Post conversation with MD pt allowed nurse to administer PRN without the assistance of security. Nurse will continue to monitor

## 2012-05-02 NOTE — Progress Notes (Signed)
CRITICAL VALUE ALERT  Critical value received:  Positive MRSA PCR  Date of notification:  05/02/12  Time of notification:  0600  Critical value read back: yes  Nurse who received alert:  M.Foster Simpson, RN  MD notified (1st page):  MRSA standing orders initiated  Time of first page:    MD notified (2nd page):  Time of second page:  Responding MD:    Time MD responded:

## 2012-05-02 NOTE — Clinical Social Work Psych Assess (Signed)
Clinical Social Work Department CLINICAL SOCIAL WORK PSYCHIATRY SERVICE LINE ASSESSMENT 05/02/2012  Patient:  MAJID MCCRAVY  Account:  [id number]  Admit Date:  05/01/2012  Clinical Social Worker:  Read Drivers  Date/Time:  05/02/2012 02:22 PM Referred by:  RN  Date referred:  05/02/2012 Reason for Referral  Behavioral Health Issues   Presenting Symptoms/Problems (In the person's/family's own words):   Pt states he is at the hospital because of a "human rights violation"   Abuse/Neglect/Trauma History (check all that apply)  Denies history   Abuse/Neglect/Trauma Comments:   unable to accurately assess due to pt tangential thoughts and flight of ideas   Psychiatric History (check all that apply)  Inpatient/hospitilization   Psychiatric medications:  (HALDOL) injection 5 mg, 5 mg, Intravenous, Q6H PRN   Current Mental Health Hospitalizations/Previous Mental Health History:   per chart review pt was at [facility] 05/2011 for SI   Current provider:   none reported or noted   Place and Date:   inpatient [facility] 05/2011   Current Medications:   Scheduled Meds:      . cefTRIAXone (ROCEPHIN)  IV  1 g Intravenous Q24H  . Chlorhexidine Gluconate Cloth  6 each Topical Q0600  . mupirocin ointment  1 application Nasal BID  . pantoprazole (PROTONIX) IV  40 mg Intravenous Q12H  . [DISCONTINUED] sodium chloride   Intravenous STAT        Continuous Infusions:      . sodium chloride           PRN Meds:.acetaminophen, acetaminophen, haloperidol lactate, ondansetron (ZOFRAN) IV, ondansetron       Previous Impatient Admission/Date/Reason:   [facility] - 05/2011 for SI   Emotional Health / Current Symptoms    Suicide/Self Harm  None reported   Suicide attempt in the past:   per chart review hx of SI (unsure of acutal attempts) unable to accuratly assess pt   Other harmful behavior:   unable to accurately assess   Psychotic/Dissociative Symptoms  Disorganized Speech  Delusional  Unable to  accurately assess   Other Psychotic/Dissociative Symptoms:   Pt speech projected very loudly.  Pt thoughts are tangential.  Pt conversation was fixed on "human rights violation" and his family has been "decentralized".    Attention/Behavioral Symptoms  Unable to accurately assess   Other Attention / Behavioral Symptoms:   Pt seemed to be preoccupied with the above forementioned situation.  Unsure if this is a delusion or an acutal event.  CSW left called [name] at [phone], received no answer.  CSW received a message "mailbox is full, unable to leave msg"    Cognitive Impairment  Recent Memory Impairment  Orientation - Self  Poor/Impaired Decision-Making  Poor Judgement   Other Cognitive Impairment:   Pt thoughts and communication incoherent.  pt unable to answer questions appropriately.    Mood and Adjustment  Anxious  Confused    Stress, Anxiety, Trauma, Any Recent Loss/Stressor  Anxiety   Anxiety (frequency):   moderate   Phobia (specify):   none reported or noted   Compulsive behavior (specify):   none reported or noted   Obsessive behavior (specify):   none reported or noted   Other:   pt states that "family has been decentralized"   Substance Abuse/Use  None   SBIRT completed (please refer for detailed history):  N  Self-reported substance use:   unable to accurately assess  UDS conducted- nothing detected   Urinary Drug Screen Completed:  Y Alcohol  level:   none detected    Environmental/Housing/Living Arrangement  With Family Member   Who is in the home:   Sister- pt unable to tell CSW sister's name or contact information  [name] listed on facesheet [phone]   Emergency contact:  per chart review, [name] number listed above- unable to verify this with the pt   Financial  Medicare   Patient's Strengths and Goals (patient's own words):   Pt has support at home.  pt compliant with medical advice.   Clinical Social  Worker's Interpretive Summary:   CSW assessed pt at bedside.  Pt speech was loud.  Pt eye contact was poor.  Pt starred at the ceiling during CSW assessment.  Pt would often speak two-three words and blank stare to the ceiling for seconds.  CSW would repeat the assessment question.  Pt had to be redirected numerous times during the assessment. 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  My family has been decentralized."  This was the answer to all questions.  Pt was only oriented to self. When asked what year it was, pt responded with "1914".  Pt could not tell CSW where he was, what year he was born or who our President was.  Pt did tell CSW that he lives with his sister, though pt could/would not tell CSW what his sister's name or contact information was.  CSW attempted to assess for SI (per chart review pt has hx of SI and inpatient stay at [facility])- unable to assess based on the pt being fixated on the above forementioned delusion/situation. CSW attempted to call [name] at [phone] to gather collateral information re: baseline and situaion pt speaks of- this number would not allow CSW to leave a message and no one answered.  It was confirmed that this was the correct # for [facility].  Psychiatry has been consulted to assist.   Disposition:  Recommend Psych CSW continuing to support while in hospital   Vickii Penna, [place] [phone]  Clinical Social Work

## 2012-05-02 NOTE — Consult Note (Signed)
Reason for Consult: Schizoaffective disorder; GI bleeding Referring Physician: Dr. Doroteo Bradford [name] is an 72 y.o. male.  HPI: Patient was seen and chart reviewed. Case discussed with patient sister Ms. [name] and Engineer, drilling. Patient is a 72 y.o. AA male with a history of schizophrenia who presents to the hospital with possible chest pain/abdominal pain. Patient has been suffering with mental illness possible schizoaffective disorder since he was teenager, never married and has no children. He spends a couple of nights with his sister place and than wonders around as homeless. He stated that he has been taking herbs and sister endorses the same. His sister stated that he was veteran and gets services from [facility]. He has been non compliant with his prescribed medications. His contact with his sister was about a month ago. His last [facility] admission was may 2013 and has history of [facility].    He is an extremely poor historian. Majority history is obtained from medical record and sister. His sister seems to be supportive and showed interest to visit him while he is staying in the hospital. His thought processes very tangential, and paranoid delusions like pain in penis, not had sex in several years etc. He was evaluated in the emergency room and noted to be severely anemic with a hemoglobin of 5.3. Creatinine was also found to be elevated at 1.68 and urinalysis was indicative of UTI. Patient refused rectal examination.   MSE: Patient has been psychotic, delusional, paranoid, responding to internal stimuli and takes long time to respond to queried. He has loud voice and monotonous tone. He denied SI/HI and poor insight, judgment and impulses.   Past Medical History  Diagnosis Date  . Schizophrenia   . Hepatitis C   . Benign hypertrophy of prostate   . Urinary tract infection     Past Surgical History  Procedure Laterality Date  . Knee surgery      right    History reviewed. No  pertinent family history.  Social History:  reports that he quit smoking about 36 years ago. He does not have any smokeless tobacco history on file. He reports that he does not drink alcohol or use illicit drugs.  Allergies: No Known Allergies  Medications: I have reviewed the patient's current medications.  Results for orders placed during the hospital encounter of 05/01/12 (from the past 48 hour(s))  URINALYSIS, ROUTINE W REFLEX MICROSCOPIC     Status: Abnormal   Collection Time    05/01/12  8:15 PM      Result Value Range   Color, Urine YELLOW  YELLOW   APPearance HAZY (*) CLEAR   Specific Gravity, Urine 1.018  1.005 - 1.030   pH 5.5  5.0 - 8.0   Glucose, UA NEGATIVE  NEGATIVE mg/dL   Hgb urine dipstick NEGATIVE  NEGATIVE   Bilirubin Urine NEGATIVE  NEGATIVE   Ketones, ur NEGATIVE  NEGATIVE mg/dL   Protein, ur NEGATIVE  NEGATIVE mg/dL   Urobilinogen, UA 1.0  0.0 - 1.0 mg/dL   Nitrite POSITIVE (*) NEGATIVE   Leukocytes, UA MODERATE (*) NEGATIVE  URINE RAPID DRUG SCREEN (HOSP PERFORMED)     Status: None   Collection Time    05/01/12  8:15 PM      Result Value Range   Opiates NONE DETECTED  NONE DETECTED   Cocaine NONE DETECTED  NONE DETECTED   Benzodiazepines NONE DETECTED  NONE DETECTED   Amphetamines NONE DETECTED  NONE DETECTED  Tetrahydrocannabinol NONE DETECTED  NONE DETECTED   Barbiturates NONE DETECTED  NONE DETECTED   Comment:            DRUG SCREEN FOR MEDICAL PURPOSES     ONLY.  IF CONFIRMATION IS NEEDED     FOR ANY PURPOSE, NOTIFY LAB     WITHIN 5 DAYS.                LOWEST DETECTABLE LIMITS     FOR URINE DRUG SCREEN     Drug Class       Cutoff (ng/mL)     Amphetamine      1000     Barbiturate      200     Benzodiazepine   200     Tricyclics       300     Opiates          300     Cocaine          300     THC              50  URINE MICROSCOPIC-ADD ON     Status: Abnormal   Collection Time    05/01/12  8:15 PM      Result Value Range   Squamous  Epithelial / LPF RARE  RARE   WBC, UA 11-20  <3 WBC/hpf   RBC / HPF 0-2  <3 RBC/hpf   Bacteria, UA MANY (*) RARE  ACETAMINOPHEN LEVEL     Status: None   Collection Time    05/01/12  9:53 PM      Result Value Range   Acetaminophen (Tylenol), Serum <15.0  10 - 30 ug/mL   Comment:            THERAPEUTIC CONCENTRATIONS VARY     SIGNIFICANTLY. A RANGE OF 10-30     ug/mL MAY BE AN EFFECTIVE     CONCENTRATION FOR MANY PATIENTS.     HOWEVER, SOME ARE BEST TREATED     AT CONCENTRATIONS OUTSIDE THIS     RANGE.     ACETAMINOPHEN CONCENTRATIONS     >150 ug/mL AT 4 HOURS AFTER     INGESTION AND >50 ug/mL AT 12     HOURS AFTER INGESTION ARE     OFTEN ASSOCIATED WITH TOXIC     REACTIONS.  ETHANOL     Status: None   Collection Time    05/01/12  9:53 PM      Result Value Range   Alcohol, Ethyl (B) <11  0 - 11 mg/dL   Comment:            LOWEST DETECTABLE LIMIT FOR     SERUM ALCOHOL IS 11 mg/dL     FOR MEDICAL PURPOSES ONLY  SALICYLATE LEVEL     Status: Abnormal   Collection Time    05/01/12  9:53 PM      Result Value Range   Salicylate Lvl <2.0 (*) 2.8 - 20.0 mg/dL  COMPREHENSIVE METABOLIC PANEL     Status: Abnormal   Collection Time    05/01/12  9:53 PM      Result Value Range   Sodium 139  135 - 145 mEq/L   Potassium 3.9  3.5 - 5.1 mEq/L   Chloride 108  96 - 112 mEq/L   CO2 22  19 - 32 mEq/L   Glucose, Bld 108 (*) 70 - 99 mg/dL   BUN 25 (*) 6 - 23 mg/dL  Creatinine, Ser 1.68 (*) 0.50 - 1.35 mg/dL   Calcium 8.5  8.4 - 16.1 mg/dL   Total Protein 6.0  6.0 - 8.3 g/dL   Albumin 3.0 (*) 3.5 - 5.2 g/dL   AST 29  0 - 37 U/L   ALT 20  0 - 53 U/L   Alkaline Phosphatase 50  39 - 117 U/L   Total Bilirubin 0.4  0.3 - 1.2 mg/dL   GFR calc non Af Amer 40 (*) >90 mL/min   GFR calc Af Amer 46 (*) >90 mL/min   Comment:            The eGFR has been calculated     using the CKD EPI equation.     This calculation has not been     validated in all clinical     situations.     eGFR's  persistently     <90 mL/min signify     possible Chronic Kidney Disease.  CBC     Status: Abnormal   Collection Time    05/01/12  9:53 PM      Result Value Range   WBC 13.9 (*) 4.0 - 10.5 K/uL   RBC 1.70 (*) 4.22 - 5.81 MIL/uL   Hemoglobin 5.3 (*) 13.0 - 17.0 g/dL   Comment: REPEATED TO VERIFY     CRITICAL RESULT CALLED TO, READ BACK BY AND VERIFIED WITH:     [facility] 2214 05/01/12 M.CAMPBELL   HCT 15.5 (*) 39.0 - 52.0 %   MCV 91.2  78.0 - 100.0 fL   MCH 31.2  26.0 - 34.0 pg   MCHC 34.2  30.0 - 36.0 g/dL   RDW 09.6 (*) 04.5 - 40.9 %   Platelets 342  150 - 400 K/uL  PROTIME-INR     Status: None   Collection Time    05/01/12 10:45 PM      Result Value Range   Prothrombin Time 13.7  11.6 - 15.2 seconds   INR 1.06  0.00 - 1.49  HEMOGLOBIN AND HEMATOCRIT, BLOOD     Status: Abnormal   Collection Time    05/01/12 10:45 PM      Result Value Range   Hemoglobin 5.5 (*) 13.0 - 17.0 g/dL   Comment: CRITICAL VALUE NOTED.  VALUE IS CONSISTENT WITH PREVIOUSLY REPORTED AND CALLED VALUE.   HCT 16.1 (*) 39.0 - 52.0 %  TYPE AND SCREEN     Status: None   Collection Time    05/01/12 10:53 PM      Result Value Range   ABO/RH(D) O POS     Antibody Screen NEG     Sample Expiration 05/04/2012     Unit Number [id number]     Blood Component Type RED CELLS,LR     Unit division 00     Status of Unit ISSUED     Transfusion Status OK TO TRANSFUSE     Crossmatch Result Compatible     Unit Number [id number]     Blood Component Type RED CELLS,LR     Unit division 00     Status of Unit ALLOCATED     Transfusion Status OK TO TRANSFUSE     Crossmatch Result Compatible     Unit Number [id number]     Blood Component Type RBC CPDA1, LR     Unit division 00     Status of Unit ALLOCATED     Transfusion Status OK TO TRANSFUSE     Crossmatch Result Compatible  Unit Number [id number]     Blood Component Type RED CELLS,LR     Unit division 00     Status of Unit ISSUED      Transfusion Status OK TO TRANSFUSE     Crossmatch Result Compatible    PREPARE RBC (CROSSMATCH)     Status: None   Collection Time    05/01/12 10:53 PM      Result Value Range   Order Confirmation ORDER PROCESSED BY BLOOD BANK    ABO/RH     Status: None   Collection Time    05/01/12 10:53 PM      Result Value Range   ABO/RH(D) O POS    TROPONIN I     Status: None   Collection Time    05/02/12  1:55 AM      Result Value Range   Troponin I <0.30  <0.30 ng/mL   Comment:            Due to the release kinetics of cTnI,     a negative result within the first hours     of the onset of symptoms does not rule out     myocardial infarction with certainty.     If myocardial infarction is still suspected,     repeat the test at appropriate intervals.  MRSA PCR SCREENING     Status: Abnormal   Collection Time    05/02/12  3:10 AM      Result Value Range   MRSA by PCR POSITIVE (*) NEGATIVE   Comment:            The GeneXpert MRSA Assay (FDA     approved for NASAL specimens     only), is one component of a     comprehensive MRSA colonization     surveillance program. It is not     intended to diagnose MRSA     infection nor to guide or     monitor treatment for     MRSA infections.     RESULT CALLED TO, READ BACK BY AND VERIFIED WITH:     RN NOT AVAIL,CALLED TO UNIT SEC MEAGAN PIEL [id number] @0555  THANEY  CBC     Status: Abnormal   Collection Time    05/02/12  9:28 AM      Result Value Range   WBC 10.2  4.0 - 10.5 K/uL   RBC 2.30 (*) 4.22 - 5.81 MIL/uL   Hemoglobin 7.2 (*) 13.0 - 17.0 g/dL   Comment: POST TRANSFUSION SPECIMEN     CRITICAL VALUE NOTED.  VALUE IS CONSISTENT WITH PREVIOUSLY REPORTED AND CALLED VALUE.   HCT 19.9 (*) 39.0 - 52.0 %   MCV 86.5  78.0 - 100.0 fL   MCH 31.3  26.0 - 34.0 pg   MCHC 36.2 (*) 30.0 - 36.0 g/dL   RDW 82.9 (*) 56.2 - 13.0 %   Platelets 274  150 - 400 K/uL  COMPREHENSIVE METABOLIC PANEL     Status: Abnormal   Collection Time    05/02/12  9:28  AM      Result Value Range   Sodium 141  135 - 145 mEq/L   Potassium 3.7  3.5 - 5.1 mEq/L   Chloride 112  96 - 112 mEq/L   CO2 19  19 - 32 mEq/L   Glucose, Bld 114 (*) 70 - 99 mg/dL   BUN 19  6 - 23 mg/dL   Creatinine, Ser 8.65 (*) 0.50 - 1.35  mg/dL   Calcium 8.4  8.4 - 16.1 mg/dL   Total Protein 5.6 (*) 6.0 - 8.3 g/dL   Albumin 2.7 (*) 3.5 - 5.2 g/dL   AST 25  0 - 37 U/L   ALT 17  0 - 53 U/L   Alkaline Phosphatase 45  39 - 117 U/L   Total Bilirubin 1.3 (*) 0.3 - 1.2 mg/dL   GFR calc non Af Amer 42 (*) >90 mL/min   GFR calc Af Amer 49 (*) >90 mL/min   Comment:            The eGFR has been calculated     using the CKD EPI equation.     This calculation has not been     validated in all clinical     situations.     eGFR's persistently     <90 mL/min signify     possible Chronic Kidney Disease.  PROTIME-INR     Status: None   Collection Time    05/02/12  9:28 AM      Result Value Range   Prothrombin Time 14.1  11.6 - 15.2 seconds   INR 1.10  0.00 - 1.49  RETICULOCYTES     Status: Abnormal   Collection Time    05/02/12  9:28 AM      Result Value Range   Retic Ct Pct 10.8 (*) 0.4 - 3.1 %   RBC. 2.30 (*) 4.22 - 5.81 MIL/uL   Retic Count, Manual 248.4 (*) 19.0 - 186.0 K/uL    Ct Abdomen Pelvis Wo Contrast  05/02/2012  *RADIOLOGY REPORT*  Clinical Data: Chest pain.  Medical clearance.  CT ABDOMEN AND PELVIS WITHOUT CONTRAST  Technique:  Multidetector CT imaging of the abdomen and pelvis was performed following the standard protocol without intravenous contrast.  Comparison: CT urogram 07/27/2010.  Findings: The lung bases are clear without focal nodule, mass, or airspace disease.  The heart size is normal.  No significant pleural or pericardial effusion is present.  The liver and spleen are within normal limits.  A moderate hiatal hernia is present.  The wall is markedly thickened in the hernia. The stomach and duodenum are within normal limits.  The pancreas is unremarkable.   Common bile duct is normal.  Layering stones are again seen within the gallbladder.  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  The left adrenal gland is normal.  No significant nephrolithiasis is present.  The ureters are within normal limits bilaterally.  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 There is a right-sided gluteal lipoma and a left-sided diaphragmatic lipoma, all stable.  The prostate is markedly enlarged but, measuring 7.2 x 7.1 cm on the sagittal images, slightly larger than on the prior study.  Two large bladder stones are stable.  The bone windows demonstrate no focal lytic or blastic lesions.  IMPRESSION:  1.  No evidence for nephrolithiasis or hydronephrosis. 2.  Moderate hiatal hernia with marked wall thickening.  Recommend endoscopy to exclude neoplasm. 3.  No evidence for nephrolithiasis or hydronephrosis. 4.  Marked enlargement of the prostate gland.  This may be obstructing. 5.  Stable appearance of bladder stones.   Original Report Authenticated By: Marin Roberts, M.D.     Positive for bipolar, mood swings, sleep disturbance and paranoid delusions, non compliance and poor historian Blood pressure 111/56, pulse 65, temperature 97.7 F (36.5 C), temperature source Oral, resp. rate 23, height 5' 8.5" (1.74 m), weight 166 lb 14.2  oz (75.7 kg), SpO2 97.00%.   Assessment/Plan: Schizoaffective disorder, chronic Non compliance Homeless  Recommendation: Will start haldol 5 mg PO /IM BID for paranoid psychosis, Cogentin 1 mg PO/IM BID / EPS and Ativan 1 mg PO /IM for agitation. I would like to stay away from depakote due to increased bleeding as side effect.. Patient does not meet criteria of acute psychiatric hospitalization and has no safety issues. He has more psychosocial issue, may benefit from [name] hospitalization and wrap around services if cooperative. Psych will follow as needed.  Marivel Mcclarty,JANARDHAHA R. 05/02/2012, 1:15 PM

## 2012-05-02 NOTE — Consult Note (Signed)
[facility] Consult Note  Referring Provider: No ref. provider found Primary Care Physician:  No primary provider on file. Primary Gastroenterologist:  Dr.  Antony Contras Complaint: Reportedly chest and abdominal pain. HPI: [name] is an 72 y.o. black male  with poorly controlled schizophrenia and the emergency room with some complaints of chest and abdominal pain although history very difficult and inconsistent. Among other issues he was found to have a hemoglobin of 5 and there is some previous report of possible black stools in the past. The patient was belligerent and suspicious in initial history attempts and has now been sedated with Haldol and is not arousable so I did not press the issue. There are no reports in the chart of him having any GI procedures. His vital signs are stable.  Past Medical History  Diagnosis Date  . Schizophrenia   . Hepatitis C   . Benign hypertrophy of prostate   . Urinary tract infection     Past Surgical History  Procedure Laterality Date  . Knee surgery      right    Medications Prior to Admission  Medication Sig Dispense Refill  . phenazopyridine (PYRIDIUM) 95 MG tablet Take 95 mg by mouth 3 (three) times daily as needed for pain.        Allergies: No Known Allergies  History reviewed. No pertinent family history.  Social History:  reports that he quit smoking about 36 years ago. He does not have any smokeless tobacco history on file. He reports that he does not drink alcohol or use illicit drugs.  Review of Systems: negative except unobtainable   Blood pressure 111/55, pulse 70, temperature 98 F (36.7 C), temperature source Oral, resp. rate 18, height 5' 8.5" (1.74 m), weight 75.7 kg (166 lb 14.2 oz), SpO2 100.00%. General exam deferred for reasons stated above.  Results for orders placed during the hospital encounter of 05/01/12 (from the past 48 hour(s))  URINALYSIS, ROUTINE W REFLEX MICROSCOPIC     Status: Abnormal    Collection Time    05/01/12  8:15 PM      Result Value Range   Color, Urine YELLOW  YELLOW   APPearance HAZY (*) CLEAR   Specific Gravity, Urine 1.018  1.005 - 1.030   pH 5.5  5.0 - 8.0   Glucose, UA NEGATIVE  NEGATIVE mg/dL   Hgb urine dipstick NEGATIVE  NEGATIVE   Bilirubin Urine NEGATIVE  NEGATIVE   Ketones, ur NEGATIVE  NEGATIVE mg/dL   Protein, ur NEGATIVE  NEGATIVE mg/dL   Urobilinogen, UA 1.0  0.0 - 1.0 mg/dL   Nitrite POSITIVE (*) NEGATIVE   Leukocytes, UA MODERATE (*) NEGATIVE  URINE RAPID DRUG SCREEN (HOSP PERFORMED)     Status: None   Collection Time    05/01/12  8:15 PM      Result Value Range   Opiates NONE DETECTED  NONE DETECTED   Cocaine NONE DETECTED  NONE DETECTED   Benzodiazepines NONE DETECTED  NONE DETECTED   Amphetamines NONE DETECTED  NONE DETECTED   Tetrahydrocannabinol NONE DETECTED  NONE DETECTED   Barbiturates NONE DETECTED  NONE DETECTED   Comment:            DRUG SCREEN FOR MEDICAL PURPOSES     ONLY.  IF CONFIRMATION IS NEEDED     FOR ANY PURPOSE, NOTIFY LAB     WITHIN 5 DAYS.                LOWEST DETECTABLE LIMITS  FOR URINE DRUG SCREEN     Drug Class       Cutoff (ng/mL)     Amphetamine      1000     Barbiturate      200     Benzodiazepine   200     Tricyclics       300     Opiates          300     Cocaine          300     THC              50  URINE MICROSCOPIC-ADD ON     Status: Abnormal   Collection Time    05/01/12  8:15 PM      Result Value Range   Squamous Epithelial / LPF RARE  RARE   WBC, UA 11-20  <3 WBC/hpf   RBC / HPF 0-2  <3 RBC/hpf   Bacteria, UA MANY (*) RARE  ACETAMINOPHEN LEVEL     Status: None   Collection Time    05/01/12  9:53 PM      Result Value Range   Acetaminophen (Tylenol), Serum <15.0  10 - 30 ug/mL   Comment:            THERAPEUTIC CONCENTRATIONS VARY     SIGNIFICANTLY. A RANGE OF 10-30     ug/mL MAY BE AN EFFECTIVE     CONCENTRATION FOR MANY PATIENTS.     HOWEVER, SOME ARE BEST TREATED     AT  CONCENTRATIONS OUTSIDE THIS     RANGE.     ACETAMINOPHEN CONCENTRATIONS     >150 ug/mL AT 4 HOURS AFTER     INGESTION AND >50 ug/mL AT 12     HOURS AFTER INGESTION ARE     OFTEN ASSOCIATED WITH TOXIC     REACTIONS.  ETHANOL     Status: None   Collection Time    05/01/12  9:53 PM      Result Value Range   Alcohol, Ethyl (B) <11  0 - 11 mg/dL   Comment:            LOWEST DETECTABLE LIMIT FOR     SERUM ALCOHOL IS 11 mg/dL     FOR MEDICAL PURPOSES ONLY  SALICYLATE LEVEL     Status: Abnormal   Collection Time    05/01/12  9:53 PM      Result Value Range   Salicylate Lvl <2.0 (*) 2.8 - 20.0 mg/dL  COMPREHENSIVE METABOLIC PANEL     Status: Abnormal   Collection Time    05/01/12  9:53 PM      Result Value Range   Sodium 139  135 - 145 mEq/L   Potassium 3.9  3.5 - 5.1 mEq/L   Chloride 108  96 - 112 mEq/L   CO2 22  19 - 32 mEq/L   Glucose, Bld 108 (*) 70 - 99 mg/dL   BUN 25 (*) 6 - 23 mg/dL   Creatinine, Ser 1.61 (*) 0.50 - 1.35 mg/dL   Calcium 8.5  8.4 - 09.6 mg/dL   Total Protein 6.0  6.0 - 8.3 g/dL   Albumin 3.0 (*) 3.5 - 5.2 g/dL   AST 29  0 - 37 U/L   ALT 20  0 - 53 U/L   Alkaline Phosphatase 50  39 - 117 U/L   Total Bilirubin 0.4  0.3 - 1.2 mg/dL   GFR calc non Af  Amer 40 (*) >90 mL/min   GFR calc Af Amer 46 (*) >90 mL/min   Comment:            The eGFR has been calculated     using the CKD EPI equation.     This calculation has not been     validated in all clinical     situations.     eGFR's persistently     <90 mL/min signify     possible Chronic Kidney Disease.  CBC     Status: Abnormal   Collection Time    05/01/12  9:53 PM      Result Value Range   WBC 13.9 (*) 4.0 - 10.5 K/uL   RBC 1.70 (*) 4.22 - 5.81 MIL/uL   Hemoglobin 5.3 (*) 13.0 - 17.0 g/dL   Comment: REPEATED TO VERIFY     CRITICAL RESULT CALLED TO, READ BACK BY AND VERIFIED WITH:     [facility] 2214 05/01/12 M.CAMPBELL   HCT 15.5 (*) 39.0 - 52.0 %   MCV 91.2  78.0 - 100.0 fL   MCH 31.2   26.0 - 34.0 pg   MCHC 34.2  30.0 - 36.0 g/dL   RDW 16.1 (*) 09.6 - 04.5 %   Platelets 342  150 - 400 K/uL  PROTIME-INR     Status: None   Collection Time    05/01/12 10:45 PM      Result Value Range   Prothrombin Time 13.7  11.6 - 15.2 seconds   INR 1.06  0.00 - 1.49  HEMOGLOBIN AND HEMATOCRIT, BLOOD     Status: Abnormal   Collection Time    05/01/12 10:45 PM      Result Value Range   Hemoglobin 5.5 (*) 13.0 - 17.0 g/dL   Comment: CRITICAL VALUE NOTED.  VALUE IS CONSISTENT WITH PREVIOUSLY REPORTED AND CALLED VALUE.   HCT 16.1 (*) 39.0 - 52.0 %  TYPE AND SCREEN     Status: None   Collection Time    05/01/12 10:53 PM      Result Value Range   ABO/RH(D) O POS     Antibody Screen NEG     Sample Expiration 05/04/2012     Unit Number [id number]     Blood Component Type RED CELLS,LR     Unit division 00     Status of Unit ISSUED     Transfusion Status OK TO TRANSFUSE     Crossmatch Result Compatible     Unit Number [id number]     Blood Component Type RED CELLS,LR     Unit division 00     Status of Unit ALLOCATED     Transfusion Status OK TO TRANSFUSE     Crossmatch Result Compatible     Unit Number [id number]     Blood Component Type RBC CPDA1, LR     Unit division 00     Status of Unit ALLOCATED     Transfusion Status OK TO TRANSFUSE     Crossmatch Result Compatible     Unit Number [id number]     Blood Component Type RED CELLS,LR     Unit division 00     Status of Unit ISSUED     Transfusion Status OK TO TRANSFUSE     Crossmatch Result Compatible    PREPARE RBC (CROSSMATCH)     Status: None   Collection Time    05/01/12 10:53 PM      Result Value Range   Order Confirmation  ORDER PROCESSED BY BLOOD BANK    ABO/RH     Status: None   Collection Time    05/01/12 10:53 PM      Result Value Range   ABO/RH(D) O POS    TROPONIN I     Status: None   Collection Time    05/02/12  1:55 AM      Result Value Range   Troponin I <0.30  <0.30 ng/mL   Comment:             Due to the release kinetics of cTnI,     a negative result within the first hours     of the onset of symptoms does not rule out     myocardial infarction with certainty.     If myocardial infarction is still suspected,     repeat the test at appropriate intervals.  MRSA PCR SCREENING     Status: Abnormal   Collection Time    05/02/12  3:10 AM      Result Value Range   MRSA by PCR POSITIVE (*) NEGATIVE   Comment:            The GeneXpert MRSA Assay (FDA     approved for NASAL specimens     only), is one component of a     comprehensive MRSA colonization     surveillance program. It is not     intended to diagnose MRSA     infection nor to guide or     monitor treatment for     MRSA infections.     RESULT CALLED TO, READ BACK BY AND VERIFIED WITH:     RN NOT AVAIL,CALLED TO UNIT SEC [facility]AG[facility] PIEL [id number] @0555  THANEY  CBC     Status: Abnormal   Collection Time    05/02/12  9:28 AM      Result Value Range   WBC 10.2  4.0 - 10.5 K/uL   RBC 2.30 (*) 4.22 - 5.81 MIL/uL   Hemoglobin 7.2 (*) 13.0 - 17.0 g/dL   Comment: POST TRANSFUSION SPECIMEN     CRITICAL VALUE NOTED.  VALUE IS CONSISTENT WITH PREVIOUSLY REPORTED AND CALLED VALUE.   HCT 19.9 (*) 39.0 - 52.0 %   MCV 86.5  78.0 - 100.0 fL   MCH 31.3  26.0 - 34.0 pg   MCHC 36.2 (*) 30.0 - 36.0 g/dL   RDW 91.4 (*) 78.2 - 95.6 %   Platelets 274  150 - 400 K/uL  RETICULOCYTES     Status: Abnormal   Collection Time    05/02/12  9:28 AM      Result Value Range   Retic Ct Pct 10.8 (*) 0.4 - 3.1 %   RBC. 2.30 (*) 4.22 - 5.81 MIL/uL   Retic Count, Manual 248.4 (*) 19.0 - 186.0 K/uL   Ct Abdomen Pelvis Wo Contrast  05/02/2012  *RADIOLOGY REPORT*  Clinical Data: Chest pain.  Medical clearance.  CT ABDOMEN AND PELVIS WITHOUT CONTRAST  Technique:  Multidetector CT imaging of the abdomen and pelvis was performed following the standard protocol without intravenous contrast.  Comparison: CT urogram 07/27/2010.  Findings: The lung bases are  clear without focal nodule, mass, or airspace disease.  The heart size is normal.  No significant pleural or pericardial effusion is present.  The liver and spleen are within normal limits.  A moderate hiatal hernia is present.  The wall is markedly thickened in the hernia. The stomach and duodenum are within  normal limits.  The pancreas is unremarkable.  Common bile duct is normal.  Layering stones are again seen within the gallbladder.  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  The left adrenal gland is normal.  No significant nephrolithiasis is present.  The ureters are within normal limits bilaterally.  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 There is a right-sided gluteal lipoma and a left-sided diaphragmatic lipoma, all stable.  The prostate is markedly enlarged but, measuring 7.2 x 7.1 cm on the sagittal images, slightly larger than on the prior study.  Two large bladder stones are stable.  The bone windows demonstrate no focal lytic or blastic lesions.  IMPRESSION:  1.  No evidence for nephrolithiasis or hydronephrosis. 2.  Moderate hiatal hernia with marked wall thickening.  Recommend endoscopy to exclude neoplasm. 3.  No evidence for nephrolithiasis or hydronephrosis. 4.  Marked enlargement of the prostate gland.  This may be obstructing. 5.  Stable appearance of bladder stones.   Original Report Authenticated By: Marin Roberts, M.D.     Assessment: Anemia with presumed GI bleeding probably subacute or occult Reported hepatitis C with normal liver function tests. Plan:  Empiric treatment with proton pump inhibitor and transfuse as you're doing. Will await Hemoccults and see if we can perform GI workup at some point depending on his mental status, competence, power of attorney issues et Karie Soda. Prentice Sackrider C 05/02/2012, 10:18 AM

## 2012-05-02 NOTE — Progress Notes (Signed)
Pt orientation to unit, room and routine. Admission INP armband ID verified with patient, and in place. Skin, clean-dry- intact without evidence of bruising, or skin tears.    Will cont to monitor and assist as needed.  Gilman Schmidt, RN

## 2012-05-02 NOTE — Progress Notes (Addendum)
[facility] Progress Note [place] TEAM 1 - Stepdown/ICU TEAM   [name] NWG:[id number] DOB: 1940/04/16 DOA: 05/01/2012 PCP: No primary provider on file.  Brief narrative: [name] is a 72 y.o. male with a history of schizophrenia who presents to the hospital with possible chest pain/abdominal pain. Patient is an extremely poor historian. Majority history is obtained from medical record. Patient has difficulty providing any history. His thought processes very tangential, and he appears to be paranoid. Per records, he comes in with a history of water half weeks of diffuse abdominal and chest pain. He's not noticed any significant bleeding. He may have had black stool. He's had no vomiting. He was evaluated in the emergency room and noted to be severely anemic with a hemoglobin of 5.3. Creatinine was also found to be elevated at 1.68 and urinalysis was indicative of UTI. Patient refused rectal examination. He is referred for admission.   Assessment/Plan: Principal Problem:     Anemia -Patient declined a rectal exam for stool occult -Will obtain stool for occult blood and re-call GI if positive -Followup anemia profile  Active Problems:   UTI (lower urinary tract infection) -Started on Rocephin -Culture pending    Schizoaffective disorder, bipolar type -Quite paranoid and at times agitated and belligerent -required Haldol earlier this morning due to this -Psychiatry consult has been called    Acute renal failure -Improving -Will continue IV fluids for 24 hours more  Code Status: Full Family Communication: None Disposition Plan: Transfer out of the step down-we'll follow him on telemetry as he may need further Haldol and we will need to watch his QTc  Consultants: GI Psychiatry  Procedures: None  Antibiotics: Rocephin 4/8  DVT prophylaxis: SCDs  HPI/Subjective: Called earlier by patient's RN because patient was yelling and belligerent. He was given 5 mg  of IV Haldol and was sleeping when I entered the room. He awakens easily and communicates well with me without any signs of agitation. I have asked him that doing a rectal exam to obtain a Hemoccult but he is refusing this. He has not noticed any blood in his stools and does not have any history of epigastric pain, reflux or poor appetite.   Objective: Blood pressure 111/56, pulse 89, temperature 97.7 F (36.5 C), temperature source Oral, resp. rate 14, height 5' 8.5" (1.74 m), weight 75.7 kg (166 lb 14.2 oz), SpO2 97.00%.  Intake/Output Summary (Last 24 hours) at 05/02/12 1439 Last data filed at 05/02/12 0900  Gross per 24 hour  Intake    245 ml  Output    750 ml  Net   -505 ml     Exam: General: No acute respiratory distress Lungs: Clear to auscultation bilaterally without wheezes or crackles Cardiovascular: Regular rate and rhythm without murmur gallop or rub normal S1 and S2 Abdomen: Nontender, nondistended, soft, bowel sounds positive, no rebound, no ascites, no appreciable mass Extremities: No significant cyanosis, clubbing, or edema bilateral lower extremities  Data Reviewed: Basic Metabolic Panel:  Recent Labs Lab 05/01/12 2153 05/02/12 0928  NA 139 141  K 3.9 3.7  CL 108 112  CO2 22 19  GLUCOSE 108* 114*  BUN 25* 19  CREATININE 1.68* 1.59*  CALCIUM 8.5 8.4   Liver Function Tests:  Recent Labs Lab 05/01/12 2153 05/02/12 0928  AST 29 25  ALT 20 17  ALKPHOS 50 45  BILITOT 0.4 1.3*  PROT 6.0 5.6*  ALBUMIN 3.0* 2.7*   No results found for this  basename: LIPASE, AMYLASE,  in the last 168 hours No results found for this basename: AMMONIA,  in the last 168 hours CBC:  Recent Labs Lab 05/01/12 2153 05/01/12 2245 05/02/12 0928  WBC 13.9*  --  10.2  HGB 5.3* 5.5* 7.2*  HCT 15.5* 16.1* 19.9*  MCV 91.2  --  86.5  PLT 342  --  274   Cardiac Enzymes:  Recent Labs Lab 05/02/12 0155  TROPONINI <0.30   BNP (last 3 results) No results found for this  basename: PROBNP,  in the last 8760 hours CBG: No results found for this basename: GLUCAP,  in the last 168 hours  Recent Results (from the past 240 hour(s))  MRSA PCR SCREENING     Status: Abnormal   Collection Time    05/02/12  3:10 AM      Result Value Range Status   MRSA by PCR POSITIVE (*) NEGATIVE Final   Comment:            The GeneXpert MRSA Assay (FDA     approved for NASAL specimens     only), is one component of a     comprehensive MRSA colonization     surveillance program. It is not     intended to diagnose MRSA     infection nor to guide or     monitor treatment for     MRSA infections.     RESULT CALLED TO, READ BACK BY AND VERIFIED WITH:     RN NOT AVAIL,CALLED TO UNIT SEC [facility] [id number] @0555  THANEY     Studies:  Recent x-ray studies have been reviewed in detail by the Attending Physician  Scheduled Meds:  Scheduled Meds: . cefTRIAXone (ROCEPHIN)  IV  1 g Intravenous Q24H  . Chlorhexidine Gluconate Cloth  6 each Topical Q0600  . mupirocin ointment  1 application Nasal BID  . pantoprazole (PROTONIX) IV  40 mg Intravenous Q12H  . [DISCONTINUED] sodium chloride   Intravenous STAT   Continuous Infusions: . sodium chloride      Time spent on care of this patient: 35 minutes  [facility]  [facility] Office  [phone] Pager - Text Page per Loretha Stapler as per below:  On-Call/Text Page:      [email or web address]      password [email or web address]  If 7PM-7AM, please contact night-coverage [email or web address] Password [email or web address] 05/02/2012, 2:39 PM   LOS: 1 day

## 2012-05-02 NOTE — H&P (Signed)
[facility] History and Physical  [name] ZOX:[id number] DOB: Mar 24, 1940 DOA: 05/01/2012  Referring physician: Dr. Manus Gunning PCP: No primary provider on file.  Specialists:   Chief Complaint: anemia  HPI: [name] is a 72 y.o. male with a history of schizophrenia who presents to the hospital with possible chest pain/abdominal pain. Patient is an extremely poor historian. Majority history is obtained from medical record. Patient has difficulty providing any history. His thought processes very tangential, and he appears to be paranoid. Per records, he comes in with a history of water half weeks of diffuse abdominal and chest pain. He's not noticed any significant bleeding. He may have had black stool. He's had no vomiting. He was evaluated in the emergency room and noted to be severely anemic with a hemoglobin of 5.3. Creatinine was also found to be elevated at 1.68 and urinalysis was indicative of UTI. Patient refused rectal examination. He is referred for admission.  Review of Systems: Unable to assess due to mental status  Past Medical History  Diagnosis Date  . Schizophrenia   . Hepatitis C   . Benign hypertrophy of prostate   . Urinary tract infection    Past Surgical History  Procedure Laterality Date  . Knee surgery      right   Social History:  reports that he quit smoking about 36 years ago. He does not have any smokeless tobacco history on file. He reports that he does not drink alcohol or use illicit drugs.   No Known Allergies  Family history: Unable to assess due to mental status  Prior to Admission medications   Medication Sig Start Date End Date Taking? Authorizing Provider  phenazopyridine (PYRIDIUM) 95 MG tablet Take 95 mg by mouth 3 (three) times daily as needed for pain.   Yes Historical Provider, MD   Physical Exam: Filed Vitals:   05/02/12 0200 05/02/12 0215 05/02/12 0230 05/02/12 0303  BP: 110/71 102/55 105/56 111/59  Pulse: 103 74 70 75   Temp:    98.4 F (36.9 C)  TempSrc:    Oral  Resp: 20 20 20    Height:    5' 8.5" (1.74 m)  Weight:    75.7 kg (166 lb 14.2 oz)  SpO2: 100% 100% 99%      General:  No acute distress  Eyes: Pupils are equal round react to light  ENT: Mucous membranes are moist  Neck: Supple  Cardiovascular: S1, S2, regular rate and rhythm  Respiratory: Clear to auscultation bilaterally  Abdomen: Soft, nontender, nondistended, bowel sounds are active, refused rectal exam  Skin: No rashes noted  Musculoskeletal: No edema in the lower extremities bilaterally  Psychiatric: Thought processes very tangential, speech is pressured, insight is poor  Neurologic: Grossly intact, nonfocal  Labs on Admission:  Basic Metabolic Panel:  Recent Labs Lab 05/01/12 2153  NA 139  K 3.9  CL 108  CO2 22  GLUCOSE 108*  BUN 25*  CREATININE 1.68*  CALCIUM 8.5   Liver Function Tests:  Recent Labs Lab 05/01/12 2153  AST 29  ALT 20  ALKPHOS 50  BILITOT 0.4  PROT 6.0  ALBUMIN 3.0*   No results found for this basename: LIPASE, AMYLASE,  in the last 168 hours No results found for this basename: AMMONIA,  in the last 168 hours CBC:  Recent Labs Lab 05/01/12 2153 05/01/12 2245  WBC 13.9*  --   HGB 5.3* 5.5*  HCT 15.5* 16.1*  MCV 91.2  --  PLT 342  --    Cardiac Enzymes:  Recent Labs Lab 05/02/12 0155  TROPONINI <0.30    BNP (last 3 results) No results found for this basename: PROBNP,  in the last 8760 hours CBG: No results found for this basename: GLUCAP,  in the last 168 hours  Radiological Exams on Admission: Ct Abdomen Pelvis Wo Contrast  05/02/2012  *RADIOLOGY REPORT*  Clinical Data: Chest pain.  Medical clearance.  CT ABDOMEN AND PELVIS WITHOUT CONTRAST  Technique:  Multidetector CT imaging of the abdomen and pelvis was performed following the standard protocol without intravenous contrast.  Comparison: CT urogram 07/27/2010.  Findings: The lung bases are clear without focal  nodule, mass, or airspace disease.  The heart size is normal.  No significant pleural or pericardial effusion is present.  The liver and spleen are within normal limits.  A moderate hiatal hernia is present.  The wall is markedly thickened in the hernia. The stomach and duodenum are within normal limits.  The pancreas is unremarkable.  Common bile duct is normal.  Layering stones are again seen within the gallbladder.  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  The left adrenal gland is normal.  No significant nephrolithiasis is present.  The ureters are within normal limits bilaterally.  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 There is a right-sided gluteal lipoma and a left-sided diaphragmatic lipoma, all stable.  The prostate is markedly enlarged but, measuring 7.2 x 7.1 cm on the sagittal images, slightly larger than on the prior study.  Two large bladder stones are stable.  The bone windows demonstrate no focal lytic or blastic lesions.  IMPRESSION:  1.  No evidence for nephrolithiasis or hydronephrosis. 2.  Moderate hiatal hernia with marked wall thickening.  Recommend endoscopy to exclude neoplasm. 3.  No evidence for nephrolithiasis or hydronephrosis. 4.  Marked enlargement of the prostate gland.  This may be obstructing. 5.  Stable appearance of bladder stones.   Original Report Authenticated By: Marin Roberts, M.D.     EKG: Independently reviewed. No acute ST changes  Assessment/Plan Active Problems:   UTI (lower urinary tract infection)   Schizoaffective disorder, bipolar type   Anemia   GI bleed   Acute renal failure   Severe anemia due to possible GI bleeding. Patient is being transfused 2 units of PRBCs. He'll be started on Protonix. Dr. Manus Gunning has discussed case with GI on call Dr. Ewing Schlein who will see the patient in consultation. Continue clear liquids for now. Hemoccult stools when available. Patient denies any excessive nonsteroidal anti-inflammatories use.  Follow serial hemoglobins. Check an anemia panel.  Urinary tract infection. Urinalysis indicative of infection. Patient with Rocephin and we'll followup urine cultures sent in ER.  Acute renal failure. Will give IV fluids and recheck. CT of the abdomen and pelvis is indicating hydronephrosis. This may be related to decreased perfusion due to severe anemia.   Schizophrenia with psychosis. I requested a psychiatry consultation. We'll start the patient on Haldol for now. QTC on EKG is not prolonged.    Code Status: full code Family Communication: no family present, unable to reach emergency contact by phone Disposition Plan: to be determined  Time spent:  Weyman Bogdon [facility] Pager [phone]  If 7PM-7AM, please contact night-coverage [email or web address] Password [facility] 05/02/2012, 3:42 AM

## 2012-05-03 ENCOUNTER — Inpatient Hospital Stay (HOSPITAL_COMMUNITY): Payer: Medicare Other

## 2012-05-03 DIAGNOSIS — F200 Paranoid schizophrenia: Secondary | ICD-10-CM

## 2012-05-03 DIAGNOSIS — N390 Urinary tract infection, site not specified: Secondary | ICD-10-CM

## 2012-05-03 DIAGNOSIS — B1920 Unspecified viral hepatitis C without hepatic coma: Secondary | ICD-10-CM

## 2012-05-03 LAB — CBC WITH DIFFERENTIAL/PLATELET
Eosinophils Absolute: 0.1 10*3/uL (ref 0.0–0.7)
Eosinophils Relative: 1 % (ref 0–5)
Hemoglobin: 8.3 g/dL — ABNORMAL LOW (ref 13.0–17.0)
Lymphocytes Relative: 13 % (ref 12–46)
Lymphs Abs: 1.1 10*3/uL (ref 0.7–4.0)
MCH: 31 pg (ref 26.0–34.0)
MCV: 90.3 fL (ref 78.0–100.0)
Monocytes Relative: 4 % (ref 3–12)
Neutrophils Relative %: 81 % — ABNORMAL HIGH (ref 43–77)
RBC: 2.68 MIL/uL — ABNORMAL LOW (ref 4.22–5.81)
WBC: 8.7 10*3/uL (ref 4.0–10.5)

## 2012-05-03 LAB — BASIC METABOLIC PANEL
CO2: 23 mEq/L (ref 19–32)
Glucose, Bld: 83 mg/dL (ref 70–99)
Potassium: 3.9 mEq/L (ref 3.5–5.1)
Sodium: 142 mEq/L (ref 135–145)

## 2012-05-03 MED ORDER — POLYETHYLENE GLYCOL 3350 17 G PO PACK
17.0000 g | PACK | Freq: Every day | ORAL | Status: DC
  Administered 2012-05-03 – 2012-05-06 (×3): 17 g via ORAL
  Filled 2012-05-03 (×4): qty 1

## 2012-05-03 MED ORDER — VITAMIN B-12 1000 MCG PO TABS
1000.0000 ug | ORAL_TABLET | Freq: Every day | ORAL | Status: DC
  Administered 2012-05-03 – 2012-05-06 (×4): 1000 ug via ORAL
  Filled 2012-05-03 (×4): qty 1

## 2012-05-03 NOTE — Progress Notes (Addendum)
[facility] Progress Note [place] TEAM 1 - Stepdown/ICU TEAM   [name]:[id number] DOB: 18-Jun-1940 DOA: 05/01/2012 PCP: No primary provider on file.  Assessment/Plan:  Anemia -Patient declined a rectal exam for stool occult and has not had a BM since admission - Will obtain stool for occult blood with next BM and will start miralax to assist process -  Will call GI if positive  -Normocytic anemia:  hgb stable yesterday.   -  Repeat CBC this morning -  Iron and folate normal -  Vitamin B12 low -  add MMA -  Start B12 -  SPEP with AML  UTI (lower urinary tract infection) with positive nitrite and mod LE and 11-20 WBC - continue Rocephin -Culture pending  Schizoaffective disorder, bipolar type.  Quite paranoid and at times agitated and belligerent - Appreciate Psychiatry recommendations - Continue haldol BID, cogentin BID and ativan for agitation.   - Per psychiatry, does not meet inpatient criteria for psych admission -  ECG to eval QTc:  QTc 445.    Acute renal failure, BUN trending down, but creatinine essentially stable.   -Will continue IV fluids -  Renal US:  neg -  FENa was not obtained initially  Code Status: Full Family Communication: None Disposition Plan:  ECG   Consultants: GI Psychiatry  Procedures: None  Antibiotics: Rocephin 4/8  DVT prophylaxis: SCDs  HPI/Subjective:  Patient is poor historian due to paranoid schizophrenia.  Denies abdominal pain, nausea, vomiting.  Has not had a BM since admission.  Perseverates on some herbs that he ate prior to admission and taking a large volume water enema with a resultant black stool.    Objective: Blood pressure 130/60, pulse 61, temperature 98.4 F (36.9 C), temperature source Oral, resp. rate 18, height 5' 8.5" (1.74 m), weight 73.4 kg (161 lb 13.1 oz), SpO2 100.00%.  Intake/Output Summary (Last 24 hours) at 05/03/12 1158 Last data filed at 05/03/12 1125  Gross per 24 hour  Intake   412.5 ml  Output   2225 ml  Net -1812.5 ml     Exam: General: Thin AAM, No acute respiratory distress HEENT:  NCAT, MMM Lungs: Clear to auscultation bilaterally without wheezes or crackles, no increased WOB Cardiovascular: Regular rate and rhythm without murmur gallop or rub normal S1 and S2, 2+ pulses Abdomen: Nontender, nondistended, soft, bowel sounds positive, no rebound, no ascites, no appreciable mass Extremities: No significant cyanosis, clubbing, or edema bilateral lower extremities  Data Reviewed: Basic Metabolic Panel:  Recent Labs Lab 05/01/12 2153 05/02/12 0928 05/03/12 0415  NA 139 141 142  K 3.9 3.7 3.9  CL 108 112 112  CO2 22 19 23   GLUCOSE 108* 114* 83  BUN 25* 19 13  CREATININE 1.68* 1.59* 1.61*  CALCIUM 8.5 8.4 8.6   Liver Function Tests:  Recent Labs Lab 05/01/12 2153 05/02/12 0928  AST 29 25  ALT 20 17  ALKPHOS 50 45  BILITOT 0.4 1.3*  PROT 6.0 5.6*  ALBUMIN 3.0* 2.7*   No results found for this basename: LIPASE, AMYLASE,  in the last 168 hours No results found for this basename: AMMONIA,  in the last 168 hours CBC:  Recent Labs Lab 05/01/12 2153 05/01/12 2245 05/02/12 0928 05/02/12 2248  WBC 13.9*  --  10.2 11.1*  HGB 5.3* 5.5* 7.2* 7.5*  HCT 15.5* 16.1* 19.9* 21.6*  MCV 91.2  --  86.5 88.9  PLT 342  --  274 308   Cardiac Enzymes:  Recent Labs Lab 05/02/12 0155  TROPONINI <0.30   BNP (last 3 results) No results found for this basename: PROBNP,  in the last 8760 hours CBG: No results found for this basename: GLUCAP,  in the last 168 hours  Recent Results (from the past 240 hour(s))  MRSA PCR SCREENING     Status: Abnormal   Collection Time    05/02/12  3:10 AM      Result Value Range Status   MRSA by PCR POSITIVE (*) NEGATIVE Final   Comment:            The GeneXpert MRSA Assay (FDA     approved for NASAL specimens     only), is one component of a     comprehensive MRSA colonization     surveillance program. It is  not     intended to diagnose MRSA     infection nor to guide or     monitor treatment for     MRSA infections.     RESULT CALLED TO, READ BACK BY AND VERIFIED WITH:     RN NOT AVAIL,CALLED TO UNIT SEC MEAGAN PIEL [id number] @0555  THANEY     Studies:  Recent x-ray studies have been reviewed in detail by the Attending Physician  Scheduled Meds:  Scheduled Meds: . benztropine  1 mg Oral BID   Or  . benztropine mesylate  1 mg Intramuscular BID  . cefTRIAXone (ROCEPHIN)  IV  1 g Intravenous Q24H  . Chlorhexidine Gluconate Cloth  6 each Topical Q0600  . haloperidol  5 mg Oral BID   Or  . haloperidol lactate  5 mg Intramuscular BID  . LORazepam  1 mg Oral BID   Or  . LORazepam  1 mg Intramuscular BID  . mupirocin ointment  1 application Nasal BID  . pantoprazole (PROTONIX) IV  40 mg Intravenous Q12H   Continuous Infusions: . sodium chloride 75 mL/hr at 05/03/12 0206    Time spent on care of this patient: 35 minutes  Landrey Mahurin [facility]:  [phone]  If 7PM-7AM, please contact night-coverage [email or web address] Password [email or web address] 05/03/2012, 11:58 AM   LOS: 2 days

## 2012-05-03 NOTE — Consult Note (Signed)
Reason for Consult: paranoid schizophrenia and homeless Referring Physician:   [name] is an 72 y.o. male.  HPI: Patient has been compliant with his medication without adverse effects. He has been responding positively without responding to internal stimuli but has decreased paranoia. He has no visitor even though his sister was willing to visit him. Reportedly he has no irritability, agitation or aggressions.   MSE: Patient was awake, alert and oriented well. He has cooperative and greeted the provider with respect. He has much better eye contact and psychomotor activity. He has less loud when speaking and making better conversation without staring spells today. He has mild paranoia but no auditory or visual hallucinations.   Past Medical History  Diagnosis Date  . Schizophrenia   . Hepatitis C   . Benign hypertrophy of prostate   . Urinary tract infection     Past Surgical History  Procedure Laterality Date  . Knee surgery      right    History reviewed. No pertinent family history.  Social History:  reports that he quit smoking about 36 years ago. He does not have any smokeless tobacco history on file. He reports that he does not drink alcohol or use illicit drugs.  Allergies: No Known Allergies  Medications: I have reviewed the patient's current medications.  Results for orders placed during the hospital encounter of 05/01/12 (from the past 48 hour(s))  URINALYSIS, ROUTINE W REFLEX MICROSCOPIC     Status: Abnormal   Collection Time    05/01/12  8:15 PM      Result Value Range   Color, Urine YELLOW  YELLOW   APPearance HAZY (*) CLEAR   Specific Gravity, Urine 1.018  1.005 - 1.030   pH 5.5  5.0 - 8.0   Glucose, UA NEGATIVE  NEGATIVE mg/dL   Hgb urine dipstick NEGATIVE  NEGATIVE   Bilirubin Urine NEGATIVE  NEGATIVE   Ketones, ur NEGATIVE  NEGATIVE mg/dL   Protein, ur NEGATIVE  NEGATIVE mg/dL   Urobilinogen, UA 1.0  0.0 - 1.0 mg/dL   Nitrite POSITIVE (*) NEGATIVE    Leukocytes, UA MODERATE (*) NEGATIVE  URINE RAPID DRUG SCREEN (HOSP PERFORMED)     Status: None   Collection Time    05/01/12  8:15 PM      Result Value Range   Opiates NONE DETECTED  NONE DETECTED   Cocaine NONE DETECTED  NONE DETECTED   Benzodiazepines NONE DETECTED  NONE DETECTED   Amphetamines NONE DETECTED  NONE DETECTED   Tetrahydrocannabinol NONE DETECTED  NONE DETECTED   Barbiturates NONE DETECTED  NONE DETECTED   Comment:            DRUG SCREEN FOR MEDICAL PURPOSES     ONLY.  IF CONFIRMATION IS NEEDED     FOR ANY PURPOSE, NOTIFY LAB     WITHIN 5 DAYS.                LOWEST DETECTABLE LIMITS     FOR URINE DRUG SCREEN     Drug Class       Cutoff (ng/mL)     Amphetamine      1000     Barbiturate      200     Benzodiazepine   200     Tricyclics       300     Opiates          300     Cocaine  300     THC              50  URINE MICROSCOPIC-ADD ON     Status: Abnormal   Collection Time    05/01/12  8:15 PM      Result Value Range   Squamous Epithelial / LPF RARE  RARE   WBC, UA 11-20  <3 WBC/hpf   RBC / HPF 0-2  <3 RBC/hpf   Bacteria, UA MANY (*) RARE  ACETAMINOPHEN LEVEL     Status: None   Collection Time    05/01/12  9:53 PM      Result Value Range   Acetaminophen (Tylenol), Serum <15.0  10 - 30 ug/mL   Comment:            THERAPEUTIC CONCENTRATIONS VARY     SIGNIFICANTLY. A RANGE OF 10-30     ug/mL MAY BE AN EFFECTIVE     CONCENTRATION FOR MANY PATIENTS.     HOWEVER, SOME ARE BEST TREATED     AT CONCENTRATIONS OUTSIDE THIS     RANGE.     ACETAMINOPHEN CONCENTRATIONS     >150 ug/mL AT 4 HOURS AFTER     INGESTION AND >50 ug/mL AT 12     HOURS AFTER INGESTION ARE     OFTEN ASSOCIATED WITH TOXIC     REACTIONS.  ETHANOL     Status: None   Collection Time    05/01/12  9:53 PM      Result Value Range   Alcohol, Ethyl (B) <11  0 - 11 mg/dL   Comment:            LOWEST DETECTABLE LIMIT FOR     SERUM ALCOHOL IS 11 mg/dL     FOR MEDICAL PURPOSES ONLY   SALICYLATE LEVEL     Status: Abnormal   Collection Time    05/01/12  9:53 PM      Result Value Range   Salicylate Lvl <2.0 (*) 2.8 - 20.0 mg/dL  COMPREHENSIVE METABOLIC PANEL     Status: Abnormal   Collection Time    05/01/12  9:53 PM      Result Value Range   Sodium 139  135 - 145 mEq/L   Potassium 3.9  3.5 - 5.1 mEq/L   Chloride 108  96 - 112 mEq/L   CO2 22  19 - 32 mEq/L   Glucose, Bld 108 (*) 70 - 99 mg/dL   BUN 25 (*) 6 - 23 mg/dL   Creatinine, Ser 1.61 (*) 0.50 - 1.35 mg/dL   Calcium 8.5  8.4 - 09.6 mg/dL   Total Protein 6.0  6.0 - 8.3 g/dL   Albumin 3.0 (*) 3.5 - 5.2 g/dL   AST 29  0 - 37 U/L   ALT 20  0 - 53 U/L   Alkaline Phosphatase 50  39 - 117 U/L   Total Bilirubin 0.4  0.3 - 1.2 mg/dL   GFR calc non Af Amer 40 (*) >90 mL/min   GFR calc Af Amer 46 (*) >90 mL/min   Comment:            The eGFR has been calculated     using the CKD EPI equation.     This calculation has not been     validated in all clinical     situations.     eGFR's persistently     <90 mL/min signify     possible Chronic Kidney Disease.  CBC  Status: Abnormal   Collection Time    05/01/12  9:53 PM      Result Value Range   WBC 13.9 (*) 4.0 - 10.5 K/uL   RBC 1.70 (*) 4.22 - 5.81 MIL/uL   Hemoglobin 5.3 (*) 13.0 - 17.0 g/dL   Comment: REPEATED TO VERIFY     CRITICAL RESULT CALLED TO, READ BACK BY AND VERIFIED WITH:     J.[facility] 2214 05/01/12 M.CAMPBELL   HCT 15.5 (*) 39.0 - 52.0 %   MCV 91.2  78.0 - 100.0 fL   MCH 31.2  26.0 - 34.0 pg   MCHC 34.2  30.0 - 36.0 g/dL   RDW 62.1 (*) 30.8 - 65.7 %   Platelets 342  150 - 400 K/uL  PROTIME-INR     Status: None   Collection Time    05/01/12 10:45 PM      Result Value Range   Prothrombin Time 13.7  11.6 - 15.2 seconds   INR 1.06  0.00 - 1.49  HEMOGLOBIN AND HEMATOCRIT, BLOOD     Status: Abnormal   Collection Time    05/01/12 10:45 PM      Result Value Range   Hemoglobin 5.5 (*) 13.0 - 17.0 g/dL   Comment: CRITICAL VALUE NOTED.   VALUE IS CONSISTENT WITH PREVIOUSLY REPORTED AND CALLED VALUE.   HCT 16.1 (*) 39.0 - 52.0 %  TYPE AND SCREEN     Status: None   Collection Time    05/01/12 10:53 PM      Result Value Range   ABO/RH(D) O POS     Antibody Screen NEG     Sample Expiration 05/04/2012     Unit Number [id number]     Blood Component Type RED CELLS,LR     Unit division 00     Status of Unit ISSUED,FINAL     Transfusion Status OK TO TRANSFUSE     Crossmatch Result Compatible     Unit Number [id number]     Blood Component Type RED CELLS,LR     Unit division 00     Status of Unit ALLOCATED     Transfusion Status OK TO TRANSFUSE     Crossmatch Result Compatible     Unit Number [id number]     Blood Component Type RBC CPDA1, LR     Unit division 00     Status of Unit ALLOCATED     Transfusion Status OK TO TRANSFUSE     Crossmatch Result Compatible     Unit Number [id number]     Blood Component Type RED CELLS,LR     Unit division 00     Status of Unit ISSUED,FINAL     Transfusion Status OK TO TRANSFUSE     Crossmatch Result Compatible    PREPARE RBC (CROSSMATCH)     Status: None   Collection Time    05/01/12 10:53 PM      Result Value Range   Order Confirmation ORDER PROCESSED BY BLOOD BANK    ABO/RH     Status: None   Collection Time    05/01/12 10:53 PM      Result Value Range   ABO/RH(D) O POS    TROPONIN I     Status: None   Collection Time    05/02/12  1:55 AM      Result Value Range   Troponin I <0.30  <0.30 ng/mL   Comment:            Due to  the release kinetics of cTnI,     a negative result within the first hours     of the onset of symptoms does not rule out     myocardial infarction with certainty.     If myocardial infarction is still suspected,     repeat the test at appropriate intervals.  MRSA PCR SCREENING     Status: Abnormal   Collection Time    05/02/12  3:10 AM      Result Value Range   MRSA by PCR POSITIVE (*) NEGATIVE   Comment:            The GeneXpert  MRSA Assay (FDA     approved for NASAL specimens     only), is one component of a     comprehensive MRSA colonization     surveillance program. It is not     intended to diagnose MRSA     infection nor to guide or     monitor treatment for     MRSA infections.     RESULT CALLED TO, READ BACK BY AND VERIFIED WITH:     RN NOT AVAIL,CALLED TO UNIT SEC [facility] PIEL [id number] @0555  THANEY  CBC     Status: Abnormal   Collection Time    05/02/12  9:28 AM      Result Value Range   WBC 10.2  4.0 - 10.5 K/uL   RBC 2.30 (*) 4.22 - 5.81 MIL/uL   Hemoglobin 7.2 (*) 13.0 - 17.0 g/dL   Comment: POST TRANSFUSION SPECIMEN     CRITICAL VALUE NOTED.  VALUE IS CONSISTENT WITH PREVIOUSLY REPORTED AND CALLED VALUE.   HCT 19.9 (*) 39.0 - 52.0 %   MCV 86.5  78.0 - 100.0 fL   MCH 31.3  26.0 - 34.0 pg   MCHC 36.2 (*) 30.0 - 36.0 g/dL   RDW 29.5 (*) 62.1 - 30.8 %   Platelets 274  150 - 400 K/uL  COMPREHENSIVE METABOLIC PANEL     Status: Abnormal   Collection Time    05/02/12  9:28 AM      Result Value Range   Sodium 141  135 - 145 mEq/L   Potassium 3.7  3.5 - 5.1 mEq/L   Chloride 112  96 - 112 mEq/L   CO2 19  19 - 32 mEq/L   Glucose, Bld 114 (*) 70 - 99 mg/dL   BUN 19  6 - 23 mg/dL   Creatinine, Ser 6.57 (*) 0.50 - 1.35 mg/dL   Calcium 8.4  8.4 - 84.6 mg/dL   Total Protein 5.6 (*) 6.0 - 8.3 g/dL   Albumin 2.7 (*) 3.5 - 5.2 g/dL   AST 25  0 - 37 U/L   ALT 17  0 - 53 U/L   Alkaline Phosphatase 45  39 - 117 U/L   Total Bilirubin 1.3 (*) 0.3 - 1.2 mg/dL   GFR calc non Af Amer 42 (*) >90 mL/min   GFR calc Af Amer 49 (*) >90 mL/min   Comment:            The eGFR has been calculated     using the CKD EPI equation.     This calculation has not been     validated in all clinical     situations.     eGFR's persistently     <90 mL/min signify     possible Chronic Kidney Disease.  PROTIME-INR     Status: None   Collection  Time    05/02/12  9:28 AM      Result Value Range   Prothrombin Time 14.1  11.6  - 15.2 seconds   INR 1.10  0.00 - 1.49  TSH     Status: None   Collection Time    05/02/12  9:28 AM      Result Value Range   TSH 1.510  0.350 - 4.500 uIU/mL  VITAMIN B12     Status: None   Collection Time    05/02/12  9:28 AM      Result Value Range   Vitamin B-12 283  211 - 911 pg/mL  IRON AND TIBC     Status: None   Collection Time    05/02/12  9:28 AM      Result Value Range   Iron 111  42 - 135 ug/dL   TIBC 161  096 - 045 ug/dL   Saturation Ratios 45  20 - 55 %   UIBC 137  125 - 400 ug/dL  FERRITIN     Status: None   Collection Time    05/02/12  9:28 AM      Result Value Range   Ferritin 138  22 - 322 ng/mL  FOLATE     Status: None   Collection Time    05/02/12  9:28 AM      Result Value Range   Folate 13.0     Comment: (NOTE)     Reference Ranges            Deficient:       0.4 - 3.3 ng/mL            Indeterminate:   3.4 - 5.4 ng/mL            Normal:              > 5.4 ng/mL  RETICULOCYTES     Status: Abnormal   Collection Time    05/02/12  9:28 AM      Result Value Range   Retic Ct Pct 10.8 (*) 0.4 - 3.1 %   RBC. 2.30 (*) 4.22 - 5.81 MIL/uL   Retic Count, Manual 248.4 (*) 19.0 - 186.0 K/uL  CBC     Status: Abnormal   Collection Time    05/02/12 10:48 PM      Result Value Range   WBC 11.1 (*) 4.0 - 10.5 K/uL   RBC 2.43 (*) 4.22 - 5.81 MIL/uL   Hemoglobin 7.5 (*) 13.0 - 17.0 g/dL   HCT 40.9 (*) 81.1 - 91.4 %   MCV 88.9  78.0 - 100.0 fL   MCH 30.9  26.0 - 34.0 pg   MCHC 34.7  30.0 - 36.0 g/dL   RDW 78.2 (*) 95.6 - 21.3 %   Platelets 308  150 - 400 K/uL  BASIC METABOLIC PANEL     Status: Abnormal   Collection Time    05/03/12  4:15 AM      Result Value Range   Sodium 142  135 - 145 mEq/L   Potassium 3.9  3.5 - 5.1 mEq/L   Chloride 112  96 - 112 mEq/L   CO2 23  19 - 32 mEq/L   Glucose, Bld 83  70 - 99 mg/dL   BUN 13  6 - 23 mg/dL   Creatinine, Ser 0.86 (*) 0.50 - 1.35 mg/dL   Calcium 8.6  8.4 - 57.8 mg/dL   GFR calc non Af Amer 42 (*) >90  mL/min    GFR calc Af Amer 48 (*) >90 mL/min   Comment:            The eGFR has been calculated     using the CKD EPI equation.     This calculation has not been     validated in all clinical     situations.     eGFR's persistently     <90 mL/min signify     possible Chronic Kidney Disease.  CBC WITH DIFFERENTIAL     Status: Abnormal   Collection Time    05/03/12  1:15 PM      Result Value Range   WBC 8.7  4.0 - 10.5 K/uL   RBC 2.68 (*) 4.22 - 5.81 MIL/uL   Hemoglobin 8.3 (*) 13.0 - 17.0 g/dL   HCT 16.1 (*) 09.6 - 04.5 %   MCV 90.3  78.0 - 100.0 fL   MCH 31.0  26.0 - 34.0 pg   MCHC 34.3  30.0 - 36.0 g/dL   RDW 40.9 (*) 81.1 - 91.4 %   Platelets 294  150 - 400 K/uL   Neutrophils Relative 81 (*) 43 - 77 %   Neutro Abs 7.1  1.7 - 7.7 K/uL   Lymphocytes Relative 13  12 - 46 %   Lymphs Abs 1.1  0.7 - 4.0 K/uL   Monocytes Relative 4  3 - 12 %   Monocytes Absolute 0.4  0.1 - 1.0 K/uL   Eosinophils Relative 1  0 - 5 %   Eosinophils Absolute 0.1  0.0 - 0.7 K/uL   Basophils Relative 0  0 - 1 %   Basophils Absolute 0.0  0.0 - 0.1 K/uL    Ct Abdomen Pelvis Wo Contrast  05/02/2012  *RADIOLOGY REPORT*  Clinical Data: Chest pain.  Medical clearance.  CT ABDOMEN AND PELVIS WITHOUT CONTRAST  Technique:  Multidetector CT imaging of the abdomen and pelvis was performed following the standard protocol without intravenous contrast.  Comparison: CT urogram 07/27/2010.  Findings: The lung bases are clear without focal nodule, mass, or airspace disease.  The heart size is normal.  No significant pleural or pericardial effusion is present.  The liver and spleen are within normal limits.  A moderate hiatal hernia is present.  The wall is markedly thickened in the hernia. The stomach and duodenum are within normal limits.  The pancreas is unremarkable.  Common bile duct is normal.  Layering stones are again seen within the gallbladder.  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  The left adrenal gland is normal.  No  significant nephrolithiasis is present.  The ureters are within normal limits bilaterally.  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 There is a right-sided gluteal lipoma and a left-sided diaphragmatic lipoma, all stable.  The prostate is markedly enlarged but, measuring 7.2 x 7.1 cm on the sagittal images, slightly larger than on the prior study.  Two large bladder stones are stable.  The bone windows demonstrate no focal lytic or blastic lesions.  IMPRESSION:  1.  No evidence for nephrolithiasis or hydronephrosis. 2.  Moderate hiatal hernia with marked wall thickening.  Recommend endoscopy to exclude neoplasm. 3.  No evidence for nephrolithiasis or hydronephrosis. 4.  Marked enlargement of the prostate gland.  This may be obstructing. 5.  Stable appearance of bladder stones.   Original Report Authenticated By: Marin Roberts, M.D.    US Renal  05/03/2012  *RADIOLOGY REPORT*  Clinical Data:  72 year old male with acute renal insufficiency.  RENAL/URINARY TRACT ULTRASOUND COMPLETE  Comparison:  CT abdomen and pelvis without contrast 05/01/2012 and earlier.  Findings:  Right Kidney:  No hydronephrosis.  Renal length 11.5 cm. Corticomedullary differentiation preserved.  Cortical echotexture at the upper limits of normal.  Left Kidney:  No hydronephrosis.  Renal length 11.5 cm.  Cortical echotexture similar to the right kidney.  Bladder:  Decompressed.  Prostate enlargement up to 66 mm diameter.  Other findings:  Small left pleural effusion.  IMPRESSION: 1.  No significant sonographic renal abnormality. 2.  Small left pleural effusion.  Prostate enlargement.   Original Report Authenticated By: Erskine Speed, M.D.     Positive for learning difficulty and paranoia, homeless and non compliant with medicaions. Blood pressure 109/60, pulse 90, temperature 97.5 F (36.4 C), temperature source Oral, resp. rate 16, height 5' 8.5" (1.74 m), weight 161 lb 13.1 oz (73.4 kg), SpO2  100.00%.   Assessment/Plan: Chronic paranoid schizophrenia Non compliance with psych medication  Recommendation: Continue current treatment which was well tolerated and has positively responded and no changes in medication management and monitor for EPS. Will follow up as needed.  Haeli Gerlich,JANARDHAHA R. 05/03/2012, 5:43 PM

## 2012-05-04 LAB — BASIC METABOLIC PANEL
CO2: 21 mEq/L (ref 19–32)
Chloride: 110 mEq/L (ref 96–112)
Glucose, Bld: 86 mg/dL (ref 70–99)
Sodium: 139 mEq/L (ref 135–145)

## 2012-05-04 LAB — CBC
HCT: 24.3 % — ABNORMAL LOW (ref 39.0–52.0)
MCV: 89.3 fL (ref 78.0–100.0)
RBC: 2.72 MIL/uL — ABNORMAL LOW (ref 4.22–5.81)
WBC: 7.5 10*3/uL (ref 4.0–10.5)

## 2012-05-04 MED ORDER — LORAZEPAM 1 MG PO TABS
1.0000 mg | ORAL_TABLET | ORAL | Status: DC | PRN
  Filled 2012-05-04: qty 1

## 2012-05-04 MED ORDER — PANTOPRAZOLE SODIUM 40 MG PO TBEC
40.0000 mg | DELAYED_RELEASE_TABLET | Freq: Two times a day (BID) | ORAL | Status: DC
  Administered 2012-05-04 – 2012-05-06 (×3): 40 mg via ORAL
  Filled 2012-05-04 (×3): qty 1

## 2012-05-04 MED ORDER — LORAZEPAM 2 MG/ML IJ SOLN: 1.0000 mg | INTRAMUSCULAR | Status: DC | PRN

## 2012-05-04 MED ORDER — HALOPERIDOL LACTATE 5 MG/ML IJ SOLN
5.0000 mg | Freq: Four times a day (QID) | INTRAMUSCULAR | Status: DC | PRN
  Administered 2012-05-05: 3 mg via INTRAMUSCULAR
  Filled 2012-05-04: qty 1
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
## 2012-05-04 NOTE — Progress Notes (Signed)
Pt pulled out IV earlier in shift. I attempted to get IV access x 2, but was unsuccessful. Two IV team nurses tried as well, with no success. Dr. Malachi Bonds notified. Dr. Malachi Bonds ok with leaving IV out for tonight and can reassess later for IV access if needed for IV Antibiotics. [place], Rosanna Randy

## 2012-05-04 NOTE — Progress Notes (Addendum)
[facility] Progress Note [place] TEAM 1 - Stepdown/ICU TEAM   RAFFERTY POSTLEWAIT ZOX:[id number] DOB: [birth year] DOA: 05/01/2012 PCP: No primary provider on file.  Assessment/Plan:  Anemia, occult positive.   -  Will discuss with GI timing of endoscopy  -Normocytic anemia:  hgb stable -  Iron and folate normal -  Vitamin B12 low -  MMA pending -  Continue B12 -  SPEP pending  UTI (lower urinary tract infection) with positive nitrite and mod LE and 11-20 WBC, but having some dysuria -  Culture grew coag negative staff, likely colonized because it has grown repeatedly from urine cultures from the last several years.  Will touch base with ID -  Add GC-Chlamydia  Schizoaffective disorder, bipolar type.  Paranoid and at times agitated, thinking is clearly disorganized.   - Appreciate Psychiatry recommendations - Continue haldol BID, cogentin BID and ativan for agitation.   -  ECG to eval QTc:  QTc 445.    Acute renal failure, BUN and creatinine trending down.  Baseline 0.9 -Will continue IV fluids -  Renal US:  Neg   Code Status: Full Family Communication: None Disposition Plan:  ECG   Consultants: GI Psychiatry  Procedures: None  Antibiotics: Rocephin 4/8 >> 4/10  DVT prophylaxis: SCDs  HPI/Subjective:  Patient is poor historian due to paranoid schizophrenia.  Endorses dysuria and suprapubic tenderness with dysuria.    Objective: Blood pressure 115/60, pulse 96, temperature 97.4 F (36.3 C), temperature source Oral, resp. rate 19, height 5' 8.5" (1.74 m), weight 73.418 kg (161 lb 13.7 oz), SpO2 100.00%.  Intake/Output Summary (Last 24 hours) at 05/04/12 1827 Last data filed at 05/04/12 1756  Gross per 24 hour  Intake    840 ml  Output   1875 ml  Net  -1035 ml     Exam: General: Thin AAM, No acute respiratory distress HEENT:  NCAT, MMM Lungs: Clear to auscultation bilaterally without wheezes or crackles, no increased WOB Cardiovascular: Regular rate  and rhythm without murmur gallop or rub normal S1 and S2, 2+ pulses Abdomen: Nontender, nondistended, soft, bowel sounds positive, no rebound, no ascites, no appreciable mass Extremities: No significant cyanosis, clubbing, or edema bilateral lower extremities  Data Reviewed: Basic Metabolic Panel:  Recent Labs Lab 05/01/12 2153 05/02/12 0928 05/03/12 0415 05/04/12 0645  NA 139 141 142 139  K 3.9 3.7 3.9 4.1  CL 108 112 112 110  CO2 22 19 23 21   GLUCOSE 108* 114* 83 86  BUN 25* 19 13 11   CREATININE 1.68* 1.59* 1.61* 1.39*  CALCIUM 8.5 8.4 8.6 8.6   Liver Function Tests:  Recent Labs Lab 05/01/12 2153 05/02/12 0928  AST 29 25  ALT 20 17  ALKPHOS 50 45  BILITOT 0.4 1.3*  PROT 6.0 5.6*  ALBUMIN 3.0* 2.7*   No results found for this basename: LIPASE, AMYLASE,  in the last 168 hours No results found for this basename: AMMONIA,  in the last 168 hours CBC:  Recent Labs Lab 05/01/12 2153 05/01/12 2245 05/02/12 0928 05/02/12 2248 05/03/12 1315 05/04/12 0645  WBC 13.9*  --  10.2 11.1* 8.7 7.5  NEUTROABS  --   --   --   --  7.1  --   HGB 5.3* 5.5* 7.2* 7.5* 8.3* 8.5*  HCT 15.5* 16.1* 19.9* 21.6* 24.2* 24.3*  MCV 91.2  --  86.5 88.9 90.3 89.3  PLT 342  --  274 308 294 291   Cardiac Enzymes:  Recent Labs  Lab 05/02/12 0155  TROPONINI <0.30   BNP (last 3 results) No results found for this basename: PROBNP,  in the last 8760 hours CBG: No results found for this basename: GLUCAP,  in the last 168 hours  Recent Results (from the past 240 hour(s))  URINE CULTURE     Status: None   Collection Time    05/01/12  8:15 PM      Result Value Range Status   Specimen Description URINE, CLEAN CATCH   Final   Special Requests NONE   Final   Culture  Setup Time 05/01/2012 21:00   Final   Colony Count >=100,000 COLONIES/ML   Final   Culture     Final   Value: STAPHYLOCOCCUS SPECIES (COAGULASE NEGATIVE)     Note: RIFAMPIN AND GENTAMICIN SHOULD NOT BE USED AS SINGLE DRUGS  FOR TREATMENT OF STAPH INFECTIONS.   Report Status 05/04/2012 FINAL   Final   Organism ID, Bacteria STAPHYLOCOCCUS SPECIES (COAGULASE NEGATIVE)   Final  MRSA PCR SCREENING     Status: Abnormal   Collection Time    05/02/12  3:10 AM      Result Value Range Status   MRSA by PCR POSITIVE (*) NEGATIVE Final   Comment:            The GeneXpert MRSA Assay (FDA     approved for NASAL specimens     only), is one component of a     comprehensive MRSA colonization     surveillance program. It is not     intended to diagnose MRSA     infection nor to guide or     monitor treatment for     MRSA infections.     RESULT CALLED TO, READ BACK BY AND VERIFIED WITH:     RN NOT AVAIL,CALLED TO UNIT SEC MEAGAN PIEL [id number] @0555  THANEY     Studies:  Recent x-ray studies have been reviewed in detail by the Attending Physician  Scheduled Meds:  Scheduled Meds: . benztropine  1 mg Oral BID   Or  . benztropine mesylate  1 mg Intramuscular BID  . cefTRIAXone (ROCEPHIN)  IV  1 g Intravenous Q24H  . Chlorhexidine Gluconate Cloth  6 each Topical Q0600  . haloperidol  5 mg Oral BID   Or  . haloperidol lactate  5 mg Intramuscular BID  . LORazepam  1 mg Oral BID   Or  . LORazepam  1 mg Intramuscular BID  . mupirocin ointment  1 application Nasal BID  . pantoprazole (PROTONIX) IV  40 mg Intravenous Q12H  . polyethylene glycol  17 g Oral Daily  . vitamin B-12  1,000 mcg Oral Daily   Continuous Infusions: . sodium chloride 75 mL/hr (05/04/12 1050)    Time spent on care of this patient: 35 minutes  Shivaay Stormont [facility] P[facility]:  [phone]  If 7PM-7AM, please contact night-coverage [email or web address] Password [facility] 05/04/2012, 6:27 PM   LOS: 3 days

## 2012-05-04 NOTE — Progress Notes (Signed)
Hemoccult stool positive. Dr. Malachi Bonds made aware via text page. [name], [name]

## 2012-05-05 LAB — TYPE AND SCREEN
ABO/RH(D): O POS
Antibody Screen: NEGATIVE
Unit division: 0
Unit division: 0
Unit division: 0

## 2012-05-05 LAB — GC/CHLAMYDIA PROBE AMP: GC Probe RNA: NEGATIVE

## 2012-05-05 MED ORDER — SULFAMETHOXAZOLE-TMP DS 800-160 MG PO TABS
1.0000 | ORAL_TABLET | Freq: Two times a day (BID) | ORAL | Status: DC
  Administered 2012-05-05 – 2012-05-06 (×3): 1 via ORAL
  Filled 2012-05-05 (×4): qty 1

## 2012-05-05 MED ORDER — LORAZEPAM 1 MG PO TABS: 1.0000 mg | ORAL_TABLET | Freq: Once | ORAL | Status: DC

## 2012-05-05 MED ORDER — LORAZEPAM 2 MG/ML IJ SOLN
3.0000 mg | Freq: Once | INTRAMUSCULAR | Status: AC
  Administered 2012-05-05: 3 mg via INTRAMUSCULAR

## 2012-05-05 MED ORDER — HALOPERIDOL LACTATE 5 MG/ML IJ SOLN
5.0000 mg | Freq: Once | INTRAMUSCULAR | Status: AC
  Administered 2012-05-05: 5 mg via INTRAMUSCULAR

## 2012-05-05 NOTE — Consult Note (Signed)
Reason for Consult: Schizophrenia, paranoid Referring Physician: Dr. Vilinda Boehringer [name] is an 72 y.o. male.  HPI: Patient was seen for psychiatric follow up consult. He has one episode of verbal aggression and asking for [facility] discharge. He does not give reasonable explanation for his request and required medication shorts to calm him down.   MSE: He denied suicidal ideations and homicidal idations.He does continue to endorse paranoid thoughts about police arrest and going to court etc. He talks about human rights were not respected. He is very tangential and has loosing of associations but easily redirectable.    Past Medical History  Diagnosis Date  . Schizophrenia   . Hepatitis C   . Benign hypertrophy of prostate   . Urinary tract infection     Past Surgical History  Procedure Laterality Date  . Knee surgery      right    History reviewed. No pertinent family history.  Social History:  reports that he quit smoking about 36 years ago. He does not have any smokeless tobacco history on file. He reports that he does not drink alcohol or use illicit drugs.  Allergies: No Known Allergies  Medications: I have reviewed the patient's current medications.  Results for orders placed during the hospital encounter of 05/01/12 (from the past 48 hour(s))  BASIC METABOLIC PANEL     Status: Abnormal   Collection Time    05/04/12  6:45 AM      Result Value Range   Sodium 139  135 - 145 mEq/L   Potassium 4.1  3.5 - 5.1 mEq/L   Chloride 110  96 - 112 mEq/L   CO2 21  19 - 32 mEq/L   Glucose, Bld 86  70 - 99 mg/dL   BUN 11  6 - 23 mg/dL   Creatinine, Ser 1.61 (*) 0.50 - 1.35 mg/dL   Calcium 8.6  8.4 - 09.6 mg/dL   GFR calc non Af Amer 50 (*) >90 mL/min   GFR calc Af Amer 58 (*) >90 mL/min   Comment:            The eGFR has been calculated     using the CKD EPI equation.     This calculation has not been     validated in all clinical     situations.     eGFR's persistently     <90  mL/min signify     possible Chronic Kidney Disease.  CBC     Status: Abnormal   Collection Time    05/04/12  6:45 AM      Result Value Range   WBC 7.5  4.0 - 10.5 K/uL   RBC 2.72 (*) 4.22 - 5.81 MIL/uL   Hemoglobin 8.5 (*) 13.0 - 17.0 g/dL   HCT 04.5 (*) 40.9 - 81.1 %   MCV 89.3  78.0 - 100.0 fL   MCH 31.3  26.0 - 34.0 pg   MCHC 35.0  30.0 - 36.0 g/dL   RDW 91.4 (*) 78.2 - 95.6 %   Platelets 291  150 - 400 K/uL  OCCULT BLOOD X 1 CARD TO LAB, STOOL     Status: Abnormal   Collection Time    05/04/12  8:13 AM      Result Value Range   Fecal Occult Bld POSITIVE (*) NEGATIVE    No results found.  Positive for aggressive behavior, mood swings and paranoid and delusional thoghts. Blood pressure 103/64, pulse 78, temperature 97.2 F (36.2 C),  temperature source Oral, resp. rate 18, height 5' 8.5" (1.74 m), weight 161 lb 13.7 oz (73.418 kg), SpO2 100.00%.   Assessment/Plan: Chronic paranoid schizophrenia  Recommendation: Continue current treatment plan and medications. Patient will be referred to out patient psychiatric services when medically stable. Patient can not meet criteria for involuntary acute in patient psychiatric services as he has no dangerous to himself or others, has chronic problem and also not beneficial due chronic non compliance with treatments.   [name],JANARDHAHA R. 05/05/2012, 8:00 PM

## 2012-05-05 NOTE — Progress Notes (Addendum)
Shift event: RN paged NP 2/2 pt being extremely agitated and wanting to leave AMA. He has pulled out his IV. He received his regularly scheduled meds of Cogentin 1mg , Ativan 1mg , and Haldol 5mg  po at about 2330-these have not helped him be calm.  NP to floor. When I arrive, pt is at the nurse's desk with pressured speech which is non sensical. + Disorganized thoughts and speech. Very agitated and not cooperating when asked to return to his room. Security and [employer] Emergency planning/management officer are present.  I have reviewed his chart-brief hx. Pt came in on the 7th with GIB, anemia, acute renal failure, and hx of schizophrenia with paranoid features. He is still in need of medical treatment and is not stable enough to leave the hospital.  Because of his mental illness, he is unable to make a prudent decision about staying or leaving the hospital. He has a hx of ? being homeless as well.  This NP sees him as a danger to himself and/or others and IVC papers have been completed. Papers notarized by Ezra Sites, RN/AC. Dr. Toniann Fail aware of situation. We will get a sitter for him in his room. This NP called pharmacy and confirmed that haldol 5mg  IM can be given q one hr x 3 if needed.  Jimmye Norman, NP [facility] Addendum: Pt willingly went back to his room prior to Magistrate reviewing the papers. Magistrate rejected the petition anyway.  KJKG, NP

## 2012-05-05 NOTE — Progress Notes (Signed)
Pt found walking in hall at approx 2330 dressed in street clothes with bags in hand saying he needs to leave. Explained to pt that he is still receiving treatment and needs to remain in hospital until he is better. Pt states he cant do that, he has things he needs to do. Pt attempted to leave the floor, redirected down the hall and continued to try to rationalize with pt. Pt attempted to leave via the fire exits and the end of both halls, security called. Pt stopped at nurse station and began irrationally talking about various subjects. Paged Jimmye Norman, NP (o/c [facility]), made aware of situations.  States she will come and speak with pt. Pt states he will wait around to speak with o/c. At approx 2400 Clydie Braun on unit to speak with and evaluate pt. Pt is very irradic with unorganized thoughts, paperwork started for [facility] commitment.  0050 Pt agreed to walk back to room with security at bedside. Medicated per orders, safety sitter at bedside 0145 Pt with increased agitation again, changing clothes saying he needs to leave. Security called again, MD made aware, orders received. Pt calmed down and sat in recliner. Pt clothing and belongings removed from room and placed at nurses station 0215 NP called back and advised that petition for IVC was denied and that if pt request to leave again just present him with AMA form and allow him to go. Pt sleeping at this time, will continue to monitor

## 2012-05-05 NOTE — Progress Notes (Signed)
[facility] Progress Note  Subjective: Patient without significant complaint, relatively comment this point. He ate all of his breakfast this morning without difficulty. No reported melena appear Objective: Vital signs in last 24 hours: Temp:  [97.1 F (36.2 C)-98 F (36.7 C)] 97.1 F (36.2 C) (04/11 0914) Pulse Rate:  [64-96] 85 (04/11 0914) Resp:  [16-20] 19 (04/11 0914) BP: (106-115)/(51-60) 113/58 mmHg (04/11 0914) SpO2:  [98 %-100 %] 100 % (04/11 0914) Weight change:    PE: Abdomen soft nontender  Lab Results: No results found for this or any previous visit (from the past 24 hour(s)).  Studies/Results: US Renal  05/03/2012  *RADIOLOGY REPORT*  Clinical Data: 72 year old male with acute renal insufficiency.  RENAL/URINARY TRACT ULTRASOUND COMPLETE  Comparison:  CT abdomen and pelvis without contrast 05/01/2012 and earlier.  Findings:  Right Kidney:  No hydronephrosis.  Renal length 11.5 cm. Corticomedullary differentiation preserved.  Cortical echotexture at the upper limits of normal.  Left Kidney:  No hydronephrosis.  Renal length 11.5 cm.  Cortical echotexture similar to the right kidney.  Bladder:  Decompressed.  Prostate enlargement up to 66 mm diameter.  Other findings:  Small left pleural effusion.  IMPRESSION: 1.  No significant sonographic renal abnormality. 2.  Small left pleural effusion.  Prostate enlargement.   Original Report Authenticated By: Erskine Speed, M.D.       Assessment: Anemia with heme positive stool, not clear whether acute subacute or occult bleeding Paranoid schizophrenia  Plan: EGD and possible colonoscopy will be problematic in several ways. Would need proper consent, patient apparently undergoing procedure for involuntary commitment. He is medically stable from GI bleeding standpoint and ideally any inpatient treatment of his psychiatric decompensation would be ideal prior to the endoscopy. We will continue to follow with you during this  process. Will allow him to continue diet over the weekend and possibly coordinate EGD, colonoscopy for early next week. Will continue empiric PPI therapy independent of this to cover any upper GI lesions for at least 2 months.   Rayan Dyal C 05/05/2012, 10:15 AM

## 2012-05-05 NOTE — Progress Notes (Signed)
[facility] Progress Note   [name] ZHY:[id number] DOB: [birth year] DOA: 05/01/2012 PCP: No primary provider on file.  Assessment/Plan:  Anemia, occult positive.  Unclear if upper or lower bleeding and timing (acute vs. Subacute vs. Chronic) as patient is unable to give clear history -  Patient does not have capacity to give consent for procedures and family would have to assist -  Patient, if discharged in his current state of mind, is unlikely to show up for follow up appointment.    -Normocytic anemia:  hgb stable -  Iron and folate normal -  Vitamin B12 low -  MMA pending -  Continue B12 -  SPEP pending  UTI (lower urinary tract infection) with coag negative staph with dysuria positive nitrite and mod LE and 11-20 WBC.  Spoke with Dr. Daiva Eves regarding treatment and he recommended treating for infection.   -  Added bactrim to previous culture sensitivities (will be back in about 2 days) -  Start bactrim DS  -  F/u urine culture -  GC-Chlamydia pending  Schizoaffective disorder, bipolar type.  Paranoid and at times agitated and seems more disorganized over the last 24-48 hours than before.  He has pulled out his IV several times and required a sitter in the last 24 hours.   - Paged psychiatrist and waiting to hear back:  Would like to discuss modifications to control medications, particularly for evening behavior and possibility of inpatient psychiatry admission - Continue haldol BID, cogentin BID and ativan for agitation.    Acute renal failure, BUN and creatinine trending down.  Likely prerenal. Baseline 0.9 -  Renal US:  Neg  Code Status: Full Family Communication: None Disposition Plan:  Possible discharge tomorrow pending psychiatry recommendations.     Consultants: GI Psychiatry  Procedures: None  Antibiotics: Rocephin 4/8 >> 4/10  DVT prophylaxis: SCDs  HPI/Subjective:  Patient is poor historian due to paranoid schizophrenia.  Was very agitated  overnight and required prn haldol.  Attempted to involuntarily commit him, however, the request was rejected by the magistrate.  Continues to have dysuria today, but is more organized and calm currently.  Has slept most of hte day per the sitter  Objective: Blood pressure 106/65, pulse 75, temperature 98.7 F (37.1 C), temperature source Oral, resp. rate 17, height 5' 8.5" (1.74 m), weight 73.418 kg (161 lb 13.7 oz), SpO2 98.00%.  Intake/Output Summary (Last 24 hours) at 05/05/12 1454 Last data filed at 05/05/12 1244  Gross per 24 hour  Intake    480 ml  Output    950 ml  Net   -470 ml     Exam: General: Thin AAM, No acute respiratory distress HEENT:  NCAT, MMM Lungs: Clear to auscultation bilaterally without wheezes or crackles, no increased WOB Cardiovascular: Regular rate and rhythm without murmur gallop or rub normal S1 and S2, 2+ pulses Abdomen: Nontender, nondistended, soft, bowel sounds positive, no rebound, no ascites, no appreciable mass Extremities: No significant cyanosis, clubbing, or edema bilateral lower extremities Psych:  Alert, not oriented, perseverates on legal issues and wanting to make a phone call to [facility] regarding these issues.  Made several comments suggesting he is paranoid that people are conspiring against him.    Data Reviewed: Basic Metabolic Panel:  Recent Labs Lab 05/01/12 2153 05/02/12 0928 05/03/12 0415 05/04/12 0645  NA 139 141 142 139  K 3.9 3.7 3.9 4.1  CL 108 112 112 110  CO2 22 19 23 21   GLUCOSE 108*  114* 83 86  BUN 25* 19 13 11   CREATININE 1.68* 1.59* 1.61* 1.39*  CALCIUM 8.5 8.4 8.6 8.6   Liver Function Tests:  Recent Labs Lab 05/01/12 2153 05/02/12 0928  AST 29 25  ALT 20 17  ALKPHOS 50 45  BILITOT 0.4 1.3*  PROT 6.0 5.6*  ALBUMIN 3.0* 2.7*   No results found for this basename: LIPASE, AMYLASE,  in the last 168 hours No results found for this basename: AMMONIA,  in the last 168 hours CBC:  Recent Labs Lab  05/01/12 2153 05/01/12 2245 05/02/12 0928 05/02/12 2248 05/03/12 1315 05/04/12 0645  WBC 13.9*  --  10.2 11.1* 8.7 7.5  NEUTROABS  --   --   --   --  7.1  --   HGB 5.3* 5.5* 7.2* 7.5* 8.3* 8.5*  HCT 15.5* 16.1* 19.9* 21.6* 24.2* 24.3*  MCV 91.2  --  86.5 88.9 90.3 89.3  PLT 342  --  274 308 294 291   Cardiac Enzymes:  Recent Labs Lab 05/02/12 0155  TROPONINI <0.30   BNP (last 3 results) No results found for this basename: PROBNP,  in the last 8760 hours CBG: No results found for this basename: GLUCAP,  in the last 168 hours  Recent Results (from the past 240 hour(s))  URINE CULTURE     Status: None   Collection Time    05/01/12  8:15 PM      Result Value Range Status   Specimen Description URINE, CLEAN CATCH   Final   Special Requests NONE   Final   Culture  Setup Time 05/01/2012 21:00   Final   Colony Count >=100,000 COLONIES/ML   Final   Culture     Final   Value: STAPHYLOCOCCUS SPECIES (COAGULASE NEGATIVE)     Note: RIFAMPIN AND GENTAMICIN SHOULD NOT BE USED AS SINGLE DRUGS FOR TREATMENT OF STAPH INFECTIONS.   Report Status 05/04/2012 FINAL   Final   Organism ID, Bacteria STAPHYLOCOCCUS SPECIES (COAGULASE NEGATIVE)   Final  MRSA PCR SCREENING     Status: Abnormal   Collection Time    05/02/12  3:10 AM      Result Value Range Status   MRSA by PCR POSITIVE (*) NEGATIVE Final   Comment:            The GeneXpert MRSA Assay (FDA     approved for NASAL specimens     only), is one component of a     comprehensive MRSA colonization     surveillance program. It is not     intended to diagnose MRSA     infection nor to guide or     monitor treatment for     MRSA infections.     RESULT CALLED TO, READ BACK BY AND VERIFIED WITH:     RN NOT AVAIL,CALLED TO UNIT SEC MEAGAN PIEL [id number] @0555  THANEY     Studies:  Recent x-ray studies have been reviewed in detail by the Attending Physician  Scheduled Meds:  Scheduled Meds: . benztropine  1 mg Oral BID   Or  .  benztropine mesylate  1 mg Intramuscular BID  . Chlorhexidine Gluconate Cloth  6 each Topical Q0600  . haloperidol  5 mg Oral BID   Or  . haloperidol lactate  5 mg Intramuscular BID  . LORazepam  1 mg Oral BID   Or  . LORazepam  1 mg Intramuscular BID  . mupirocin ointment  1 application Nasal BID  .  pantoprazole  40 mg Oral BID WC  . polyethylene glycol  17 g Oral Daily  . vitamin B-12  1,000 mcg Oral Daily   Continuous Infusions:    Time spent on care of this patient: 35 minutes  Stavroula Rohde [facility] P[facility]ER:  [phone]  If 7PM-7AM, please contact night-coverage [email or web address] Password [email or web address] 05/05/2012, 2:54 PM   LOS: 4 days

## 2012-05-06 LAB — URINE CULTURE: Colony Count: >=100000

## 2012-05-06 MED ORDER — HALOPERIDOL 5 MG PO TABS: 5.0000 mg | ORAL_TABLET | Freq: Two times a day (BID) | ORAL | Status: DC

## 2012-05-06 MED ORDER — SULFAMETHOXAZOLE-TMP DS 800-160 MG PO TABS: 1.0000 | ORAL_TABLET | Freq: Two times a day (BID) | ORAL | Status: DC

## 2012-05-06 MED ORDER — BENZTROPINE MESYLATE 1 MG PO TABS: 1.0000 mg | ORAL_TABLET | Freq: Two times a day (BID) | ORAL | Status: DC

## 2012-05-06 MED ORDER — CYANOCOBALAMIN 1000 MCG PO TABS: 1000.0000 ug | ORAL_TABLET | Freq: Every day | ORAL | Status: DC

## 2012-05-06 MED ORDER — LORAZEPAM 1 MG PO TABS: 1.0000 mg | ORAL_TABLET | Freq: Two times a day (BID) | ORAL | Status: DC

## 2012-05-06 NOTE — Progress Notes (Signed)
05/06/12 0914 nsg Pt was discharge to home instructions done by [facility] RN. NT was helping pt for discharge.

## 2012-05-06 NOTE — Discharge Summary (Signed)
Physician Discharge Summary  [name] ZOX:[id number] DOB: April 18, 1940 DOA: 05/01/2012  PCP: No primary provider on file.  Admit date: 05/01/2012 Discharge date: 05/06/2012  Recommendations for Outpatient Follow-up:  1.  Follow up with gastroenterology as outpatient within 1 month.  2.  Follow up with behavioral health within 1 month.   3.  Follow up PCP in 1 week to follow up pending MMA, SPEP, and final urine culture results for sensitivity of organism to bactrim.     Discharge Diagnoses:  Principal Problem:   GI bleed Active Problems:   UTI (lower urinary tract infection)   Schizoaffective disorder, bipolar type   Anemia   Acute renal failure   Discharge Condition: stable, improved   Diet recommendation: regular  Wt Readings from Last 3 Encounters:  05/06/12 73.8 kg (162 lb 11.2 oz)  06/11/11 75.297 kg (166 lb)    History of present illness:   DASHUN BORRE is a 72 y.o. male with a history of schizophrenia who presents to the hospital with possible chest pain/abdominal pain. Patient is an extremely poor historian. Majority history is obtained from medical record. Patient has difficulty providing any history. His thought processes very tangential, and he appears to be paranoid. Per records, he comes in with a history of water half weeks of diffuse abdominal and chest pain. He's not noticed any significant bleeding. He may have had black stool. He's had no vomiting. He was evaluated in the emergency room and noted to be severely anemic with a hemoglobin of 5.3. Creatinine was also found to be elevated at 1.68 and urinalysis was indicative of UTI. Patient refused rectal examination. He is referred for admission.    Hospital Course:   Anemia, occult positive. Unclear if upper or lower bleeding and timing (acute vs. Subacute vs. Chronic) as patient is unable to give clear history.  He was seen by GI who recommended follow up as outpatient for possible endoscopy.    -Normocytic  anemia:  Initial hemoglobin was 5.5 mg/dl.  He was transfused 2 units of PRBC and his hemoglobin increased to the mid-8 range.   -  Iron and folate normal  - Vitamin B12 low  - MMA pending  - Started on oral B12  - SPEP pending   UTI (lower urinary tract infection) with coag negative staph with dysuria positive nitrite and mod LE and 11-20 WBC.  URine cultures have been repeatedly positive for coag negative staph for the last 2-3 years.  Spoke with Dr. Daiva Eves regarding treatment and he recommended treating for infection.  - Added bactrim to previous culture sensitivities (will be back in about 2 days)  - Started bactrim DS  - F/u urine culture  - GC-Chlamydia neg  Schizoaffective disorder, bipolar type. Paranoid and at times agitated.  He was seen by psychiatry who recommended haldol, cogentin, and ativan, which were started.  He may follow up with behavioral health as an outpatient.    Acute renal failure, BUN and creatinine trending down. Likely prerenal. Baseline 0.9  - Renal US: Neg -  Repeat BMP as outpatient in 1 week   Consultants:  GI  Psychiatry  Procedures:  None  Antibiotics:  Rocephin 4/8 >> 4/10 Bactrim 4/11 >>   Discharge Exam: Filed Vitals:   05/06/12 0517  BP: 112/61  Pulse: 53  Temp: 97.4 F (36.3 C)  Resp: 16   Filed Vitals:   05/05/12 1403 05/05/12 1757 05/05/12 2047 05/06/12 0517  BP: 106/65 103/64 107/64 112/61  Pulse: 75 78 70 53  Temp: 98.7 F (37.1 C) 97.2 F (36.2 C) 97.1 F (36.2 C) 97.4 F (36.3 C)  TempSrc: Oral Oral Oral Axillary  Resp: 17 18 16 16   Height:      Weight:    73.8 kg (162 lb 11.2 oz)  SpO2: 98% 100% 94% 99%   Patient states that he is feeling well.  Still has dysuria.    General: Thin AAM, No acute distress  HEENT: NCAT, MMM  Lungs: Clear to auscultation bilaterally without wheezes or crackles, no increased WOB  Cardiovascular: Regular rate and rhythm without murmur gallop or rub normal S1 and S2, 2+ pulses   Abdomen: Nontender, nondistended, soft, bowel sounds positive, no rebound, no ascites, no appreciable mass  Extremities: No significant cyanosis, clubbing, or edema bilateral lower extremities  Psych: Alert and commenting on an article that he read in the newspaper today.  Perseverates on his legal issues.     Discharge Instructions      Discharge Orders   Future Orders Complete By Expires     Call MD for:  difficulty breathing, headache or visual disturbances  As directed     Call MD for:  extreme fatigue  As directed     Call MD for:  hives  As directed     Call MD for:  persistant dizziness or light-headedness  As directed     Call MD for:  persistant nausea and vomiting  As directed     Call MD for:  severe uncontrolled pain  As directed     Call MD for:  temperature >100.4  As directed     Diet general  As directed     Discharge instructions  As directed     Comments:      You were hospitalized with urinary tract infection and anemia from bleeding in the gut.  You were started on an antibiotic called Bactrim, which you should continue for 7 days, then stop.  You were found to be vitamin B12 deficient.  Please start taking vitamin B12 supplements.  You were transfused blood and seen by the gastroenterologist regarding bleeding from the gut.  Please follow up with them in clinic in 1 month to talk about where the bleeding may be coming from.  Please follow up with your primary care doctor in 1 week to have repeat blood work done to check your kidney function and your anemia and follow up on some pending labs.  Finally, you were seen by psychiatry who recommended starting haldol, cogentin, and ativan for your schizophrenia.  I have provided you a 1 month prescription of each.  Please follow up at behavioral health for further management of your schizophrenia.    Increase activity slowly  As directed         Medication List    STOP taking these medications       phenazopyridine 95 MG  tablet  Commonly known as:  PYRIDIUM      TAKE these medications       benztropine 1 MG tablet  Commonly known as:  COGENTIN  Take 1 tablet (1 mg total) by mouth 2 (two) times daily.     cyanocobalamin 1000 MCG tablet  Take 1 tablet (1,000 mcg total) by mouth daily.     haloperidol 5 MG tablet  Commonly known as:  HALDOL  Take 1 tablet (5 mg total) by mouth 2 (two) times daily.     LORazepam 1 MG tablet  Commonly known as:  ATIVAN  Take 1 tablet (1 mg total) by mouth 2 (two) times daily.     sulfamethoxazole-trimethoprim 800-160 MG per tablet  Commonly known as:  BACTRIM DS  Take 1 tablet by mouth every 12 (twelve) hours.       Follow-up Information   Follow up with HAYES,JOHN C, MD. Schedule an appointment as soon as possible for a visit in 1 month.   Contact information:   [address]                         [place] [address] [phone]       Call [facility]. (As needed)    Contact information:   [address] [place] [address] [phone]      Follow up with [facility]. Schedule an appointment as soon as possible for a visit in 1 week.   Contact information:   Call [phone] to be connected with clinics in the area.        The results of significant diagnostics from this hospitalization (including imaging, microbiology, ancillary and laboratory) are listed below for reference.    Significant Diagnostic Studies: Ct Abdomen Pelvis Wo Contrast  05/02/2012  *RADIOLOGY REPORT*  Clinical Data: Chest pain.  Medical clearance.  CT ABDOMEN AND PELVIS WITHOUT CONTRAST  Technique:  Multidetector CT imaging of the abdomen and pelvis was performed following the standard protocol without intravenous contrast.  Comparison: CT urogram 07/27/2010.  Findings: The lung bases are clear without focal nodule, mass, or airspace disease.  The heart size is normal.  No significant pleural or  pericardial effusion is present.  The liver and spleen are within normal limits.  A moderate hiatal hernia is present.  The wall is markedly thickened in the hernia. The stomach and duodenum are within normal limits.  The pancreas is unremarkable.  Common bile duct is normal.  Layering stones are again seen within the gallbladder.  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  The left adrenal gland is normal.  No significant nephrolithiasis is present.  The ureters are within normal limits bilaterally.  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 There is a right-sided gluteal lipoma and a left-sided diaphragmatic lipoma, all stable.  The prostate is markedly enlarged but, measuring 7.2 x 7.1 cm on the sagittal images, slightly larger than on the prior study.  Two large bladder stones are stable.  The bone windows demonstrate no focal lytic or blastic lesions.  IMPRESSION:  1.  No evidence for nephrolithiasis or hydronephrosis. 2.  Moderate hiatal hernia with marked wall thickening.  Recommend endoscopy to exclude neoplasm. 3.  No evidence for nephrolithiasis or hydronephrosis. 4.  Marked enlargement of the prostate gland.  This may be obstructing. 5.  Stable appearance of bladder stones.   Original Report Authenticated By: Marin Roberts, M.D.    US Renal  05/03/2012  *RADIOLOGY REPORT*  Clinical Data: 72 year old male with acute renal insufficiency.  RENAL/URINARY TRACT ULTRASOUND COMPLETE  Comparison:  CT abdomen and pelvis without contrast 05/01/2012 and earlier.  Findings:  Right Kidney:  No hydronephrosis.  Renal length 11.5 cm. Corticomedullary differentiation preserved.  Cortical echotexture at the upper limits of normal.  Left Kidney:  No hydronephrosis.  Renal length 11.5 cm.  Cortical echotexture similar to the right kidney.  Bladder:  Decompressed.  Prostate enlargement up to 66 mm diameter.  Other  findings:  Small left pleural effusion.  IMPRESSION: 1.  No significant sonographic  renal abnormality. 2.  Small left pleural effusion.  Prostate enlargement.   Original Report Authenticated By: Erskine Speed, M.D.     Microbiology: Recent Results (from the past 240 hour(s))  URINE CULTURE     Status: None   Collection Time    05/01/12  8:15 PM      Result Value Range Status   Specimen Description URINE, CLEAN CATCH   Final   Special Requests NONE   Final   Culture  Setup Time 05/01/2012 21:00   Final   Colony Count >=100,000 COLONIES/ML   Final   Culture     Final   Value: STAPHYLOCOCCUS SPECIES (COAGULASE NEGATIVE)     Note: RIFAMPIN AND GENTAMICIN SHOULD NOT BE USED AS SINGLE DRUGS FOR TREATMENT OF STAPH INFECTIONS.   Report Status 05/04/2012 FINAL   Final   Organism ID, Bacteria STAPHYLOCOCCUS SPECIES (COAGULASE NEGATIVE)   Final  MRSA PCR SCREENING     Status: Abnormal   Collection Time    05/02/12  3:10 AM      Result Value Range Status   MRSA by PCR POSITIVE (*) NEGATIVE Final   Comment:            The GeneXpert MRSA Assay (FDA     approved for NASAL specimens     only), is one component of a     comprehensive MRSA colonization     surveillance program. It is not     intended to diagnose MRSA     infection nor to guide or     monitor treatment for     MRSA infections.     RESULT CALLED TO, READ BACK BY AND VERIFIED WITH:     RN NOT AVAIL,CALLED TO UNIT SEC MEAGAN PIEL [id number] @0555  THANEY  GC/CHLAMYDIA PROBE AMP     Status: None   Collection Time    05/05/12  7:04 AM      Result Value Range Status   CT Probe RNA NEGATIVE  NEGATIVE Final   GC Probe RNA NEGATIVE  NEGATIVE Final   Comment: (NOTE)                                                                                              **Normal Reference Range: Negative**          Assay performed using the Gen-Probe APTIMA COMBO2 (R) Assay.     Acceptable specimen types for this assay include APTIMA Swabs (Unisex,     endocervical, urethral, or vaginal), first void urine, and ThinPrep     liquid  based cytology samples.     Labs: Basic Metabolic Panel:  Recent Labs Lab 05/01/12 2153 05/02/12 0928 05/03/12 0415 05/04/12 0645  NA 139 141 142 139  K 3.9 3.7 3.9 4.1  CL 108 112 112 110  CO2 22 19 23 21   GLUCOSE 108* 114* 83 86  BUN 25* 19 13 11   CREATININE 1.68* 1.59* 1.61* 1.39*  CALCIUM 8.5 8.4 8.6 8.6   Liver Function Tests:  Recent Labs Lab  05/01/12 2153 05/02/12 0928  AST 29 25  ALT 20 17  ALKPHOS 50 45  BILITOT 0.4 1.3*  PROT 6.0 5.6*  ALBUMIN 3.0* 2.7*   No results found for this basename: LIPASE, AMYLASE,  in the last 168 hours No results found for this basename: AMMONIA,  in the last 168 hours CBC:  Recent Labs Lab 05/01/12 2153 05/01/12 2245 05/02/12 0928 05/02/12 2248 05/03/12 1315 05/04/12 0645  WBC 13.9*  --  10.2 11.1* 8.7 7.5  NEUTROABS  --   --   --   --  7.1  --   HGB 5.3* 5.5* 7.2* 7.5* 8.3* 8.5*  HCT 15.5* 16.1* 19.9* 21.6* 24.2* 24.3*  MCV 91.2  --  86.5 88.9 90.3 89.3  PLT 342  --  274 308 294 291   Cardiac Enzymes:  Recent Labs Lab 05/02/12 0155  TROPONINI <0.30   BNP: BNP (last 3 results) No results found for this basename: PROBNP,  in the last 8760 hours CBG: No results found for this basename: GLUCAP,  in the last 168 hours  Time coordinating discharge: 45 minutes  Signed:  Camille Dragan  [facility] 05/06/2012, 9:16 AM

## 2012-05-06 NOTE — Progress Notes (Signed)
05/06/2012 9:38 AM  Reviewed DC instructions/medications with patient prior to discharge.  Pt verbalized understanding.  Returned patient items on hold for him to get dressed.  Medications due given to patient prior to DC.  NT walked patient out to bus stop.  Eunice Blase

## 2012-05-08 LAB — PROTEIN ELECTROPHORESIS, SERUM
Alpha-1-Globulin: 4.8 % (ref 2.9–4.9)
Alpha-2-Globulin: 11.8 % (ref 7.1–11.8)
Beta Globulin: 6.2 % (ref 4.7–7.2)
M-Spike, %: NOT DETECTED g/dL
Total Protein ELP: 5.5 g/dL — ABNORMAL LOW (ref 6.0–8.3)

## 2012-05-08 LAB — METHYLMALONIC ACID, SERUM: Methylmalonic Acid, Quantitative: 0.15 umol/L (ref <0.40)

## 2012-05-10 ENCOUNTER — Emergency Department (HOSPITAL_COMMUNITY)
Admission: EM | Admit: 2012-05-10 | Discharge: 2012-05-11 | Discharge status: Against Medical Advice | Payer: Medicare Other | Attending: Emergency Medicine | Admitting: Emergency Medicine

## 2012-05-10 ENCOUNTER — Encounter (HOSPITAL_COMMUNITY): Payer: Self-pay | Admitting: Emergency Medicine

## 2012-05-10 DIAGNOSIS — Z5900 Homelessness unspecified: Secondary | ICD-10-CM | POA: Insufficient documentation

## 2012-05-10 DIAGNOSIS — Z008 Encounter for other general examination: Secondary | ICD-10-CM | POA: Insufficient documentation

## 2012-05-10 LAB — COMPREHENSIVE METABOLIC PANEL
ALT: 21 U/L (ref 0–53)
Alkaline Phosphatase: 71 U/L (ref 39–117)
CO2: 26 mEq/L (ref 19–32)
Chloride: 106 mEq/L (ref 96–112)
GFR calc Af Amer: 60 mL/min — ABNORMAL LOW (ref >90)
GFR calc non Af Amer: 52 mL/min — ABNORMAL LOW (ref >90)
Glucose, Bld: 122 mg/dL — ABNORMAL HIGH (ref 70–99)
Potassium: 3.8 mEq/L (ref 3.5–5.1)
Sodium: 140 mEq/L (ref 135–145)
Total Bilirubin: 0.3 mg/dL (ref 0.3–1.2)
Total Protein: 7.4 g/dL (ref 6.0–8.3)

## 2012-05-10 LAB — CBC WITH DIFFERENTIAL/PLATELET
Basophils Absolute: 0 10*3/uL (ref 0.0–0.1)
Basophils Relative: 1 % (ref 0–1)
HCT: 29.3 % — ABNORMAL LOW (ref 39.0–52.0)
MCHC: 34.1 g/dL (ref 30.0–36.0)
Monocytes Absolute: 0.6 10*3/uL (ref 0.1–1.0)
Neutro Abs: 6.3 10*3/uL (ref 1.7–7.7)
RDW: 14.6 % (ref 11.5–15.5)

## 2012-05-10 NOTE — ED Notes (Signed)
Called in waiting room x3 with no response 

## 2012-05-10 NOTE — ED Notes (Signed)
PT. REQUESTING PSYCHIATRIC EVALUATION STATES SEEN HERE SEVERAL MONTHS AGO FOR HIS PSYCHIATRIC ILLNESS ( DID NOT SPECIFY) , PT. IS HOMELESS, DENIES SUICIDAL IDEATION / NO AUDITORY OR VISUAL HALLUCINATIONS.

## 2012-05-11 NOTE — ED Notes (Signed)
Introduced self to pt. Pt upset that he "has been paying social security since 1980." Pt became very agitated. Came out of room yelling, showing paperwork stating "this is what the government does and you are wondering why I am here." Security at bedside. Pt requesting to leave. Provider made aware.

## 2012-08-29 ENCOUNTER — Encounter (HOSPITAL_COMMUNITY): Payer: Self-pay | Admitting: Emergency Medicine

## 2012-08-29 LAB — URINE MICROSCOPIC-ADD ON

## 2012-08-29 LAB — URINALYSIS, ROUTINE W REFLEX MICROSCOPIC
Bilirubin Urine: NEGATIVE
Ketones, ur: NEGATIVE mg/dL
Nitrite: NEGATIVE
Specific Gravity, Urine: 1.016 (ref 1.005–1.030)
Urobilinogen, UA: 0.2 mg/dL (ref 0.0–1.0)

## 2012-08-29 NOTE — ED Notes (Signed)
UNABLE TO GIVE URINE SPECIMEN AT THIS TIME AT TRIAGE . SPECIMEN CUP GIVEN TO PT.

## 2012-08-29 NOTE — ED Notes (Signed)
PT. REPORTS DYSURIA " BURNING" FOR SEVERAL DAYS WITH OCCASIONAL DARK STOOLS FOR SEVERAL WEEKS .

## 2012-08-30 ENCOUNTER — Emergency Department (HOSPITAL_COMMUNITY)
Admission: EM | Admit: 2012-08-30 | Discharge: 2012-08-30 | Disposition: A | Discharge status: Home / Self Care | Payer: Medicare Other | Source: Home / Self Care

## 2012-08-30 ENCOUNTER — Inpatient Hospital Stay (HOSPITAL_COMMUNITY)
Admission: EM | Admit: 2012-08-30 | Discharge: 2012-08-31 | DRG: 690 | Discharge status: Against Medical Advice | Payer: Medicare Other | Attending: Internal Medicine | Admitting: Internal Medicine

## 2012-08-30 ENCOUNTER — Encounter (HOSPITAL_COMMUNITY): Payer: Self-pay | Admitting: Emergency Medicine

## 2012-08-30 ENCOUNTER — Emergency Department (HOSPITAL_COMMUNITY): Payer: Medicare Other

## 2012-08-30 DIAGNOSIS — Z87891 Personal history of nicotine dependence: Secondary | ICD-10-CM

## 2012-08-30 DIAGNOSIS — F259 Schizoaffective disorder, unspecified: Secondary | ICD-10-CM | POA: Diagnosis present

## 2012-08-30 DIAGNOSIS — D696 Thrombocytopenia, unspecified: Secondary | ICD-10-CM | POA: Diagnosis present

## 2012-08-30 DIAGNOSIS — D649 Anemia, unspecified: Secondary | ICD-10-CM | POA: Diagnosis present

## 2012-08-30 DIAGNOSIS — F250 Schizoaffective disorder, bipolar type: Secondary | ICD-10-CM | POA: Diagnosis present

## 2012-08-30 DIAGNOSIS — Z79899 Other long term (current) drug therapy: Secondary | ICD-10-CM

## 2012-08-30 DIAGNOSIS — N390 Urinary tract infection, site not specified: Principal | ICD-10-CM | POA: Diagnosis present

## 2012-08-30 DIAGNOSIS — D61818 Other pancytopenia: Secondary | ICD-10-CM | POA: Diagnosis present

## 2012-08-30 DIAGNOSIS — F319 Bipolar disorder, unspecified: Secondary | ICD-10-CM | POA: Diagnosis present

## 2012-08-30 DIAGNOSIS — N400 Enlarged prostate without lower urinary tract symptoms: Secondary | ICD-10-CM | POA: Diagnosis present

## 2012-08-30 DIAGNOSIS — B1920 Unspecified viral hepatitis C without hepatic coma: Secondary | ICD-10-CM | POA: Diagnosis present

## 2012-08-30 DIAGNOSIS — K922 Gastrointestinal hemorrhage, unspecified: Secondary | ICD-10-CM | POA: Diagnosis present

## 2012-08-30 LAB — COMPREHENSIVE METABOLIC PANEL
ALT: 15 U/L (ref 0–53)
Calcium: 9 mg/dL (ref 8.4–10.5)
Creatinine, Ser: 1.05 mg/dL (ref 0.50–1.35)
GFR calc Af Amer: 81 mL/min — ABNORMAL LOW (ref >90)
Glucose, Bld: 99 mg/dL (ref 70–99)
Sodium: 132 mEq/L — ABNORMAL LOW (ref 135–145)
Total Protein: 6.1 g/dL (ref 6.0–8.3)

## 2012-08-30 LAB — CBC WITH DIFFERENTIAL/PLATELET
Basophils Absolute: 0 10*3/uL (ref 0.0–0.1)
Eosinophils Absolute: 0 10*3/uL (ref 0.0–0.7)
Eosinophils Relative: 0 % (ref 0–5)
Lymphs Abs: 1.6 10*3/uL (ref 0.7–4.0)
MCH: 29.5 pg (ref 26.0–34.0)
MCV: 87.1 fL (ref 78.0–100.0)
Monocytes Absolute: 0.1 10*3/uL (ref 0.1–1.0)
Platelets: 15 10*3/uL — CL (ref 150–400)
RDW: 15.3 % (ref 11.5–15.5)

## 2012-08-30 LAB — URINALYSIS, ROUTINE W REFLEX MICROSCOPIC
Bilirubin Urine: NEGATIVE
Hgb urine dipstick: NEGATIVE
Ketones, ur: NEGATIVE mg/dL
Protein, ur: NEGATIVE mg/dL
Urobilinogen, UA: 0.2 mg/dL (ref 0.0–1.0)

## 2012-08-30 LAB — URINE MICROSCOPIC-ADD ON

## 2012-08-30 MED ORDER — SODIUM CHLORIDE 0.9 % IV SOLN: INTRAVENOUS | Status: DC

## 2012-08-30 MED ORDER — SODIUM CHLORIDE 0.9 % IV SOLN
80.0000 mg | Freq: Once | INTRAVENOUS | Status: AC
  Administered 2012-08-30: 80 mg via INTRAVENOUS
  Filled 2012-08-30: qty 80

## 2012-08-30 MED ORDER — ONDANSETRON HCL 4 MG/2ML IJ SOLN: 4.0000 mg | Freq: Three times a day (TID) | INTRAMUSCULAR | Status: DC | PRN

## 2012-08-30 MED ORDER — SODIUM CHLORIDE 0.9 % IV BOLUS (SEPSIS)
1000.0000 mL | Freq: Once | INTRAVENOUS | Status: AC
  Administered 2012-08-30: 1000 mL via INTRAVENOUS

## 2012-08-30 MED ORDER — GI COCKTAIL ~~LOC~~
30.0000 mL | Freq: Once | ORAL | Status: AC
  Administered 2012-08-30: 30 mL via ORAL
  Filled 2012-08-30: qty 30

## 2012-08-30 NOTE — ED Notes (Signed)
Pt is c/o painful urination, dizziness and black out spells  Pt states this has been going on for several days now

## 2012-08-30 NOTE — ED Notes (Signed)
Pt BIB EMS. Pt c/o painful urination and vertigo. Pt diagnosed with kidney stones several days ago. Pt seen for same at [facility] yesterday and ended up leaving AMA per EMS. EMS picked up from pt from [employer]

## 2012-08-30 NOTE — ED Provider Notes (Signed)
CSN: [id number]     Arrival date & time 08/30/12  2037 History     First MD Initiated Contact with Patient 08/30/12 2203     Chief Complaint  Patient presents with  . Dysuria   (Consider location/radiation/quality/duration/timing/severity/associated sxs/prior Treatment) HPI Pt picked up by EMS and library with multiple complaints. Pt complains of "indigestion" for which he take peptobismol. No vomiting or diarrhea. No frank blood or melena. No fever or chills. C/o vague, generalized abdominal pain. Pt also states he has a history of "stone in his bladder" for which he is scheduled to f/u with Dr Sherron Monday. No hematuria or flank pain. Pt has history of paranoid schizophrenia. Denies hallucination, HI, or SI.  Past Medical History  Diagnosis Date  . Schizophrenia   . Hepatitis C   . Benign hypertrophy of prostate   . Urinary tract infection    Past Surgical History  Procedure Laterality Date  . Knee surgery      right   History reviewed. No pertinent family history. History  Substance Use Topics  . Smoking status: Former Smoker -- 1.00 packs/day for 20 years    Quit date: 06/11/1975  . Smokeless tobacco: Not on file  . Alcohol Use: No    Review of Systems  Constitutional: Positive for fatigue. Negative for fever and chills.  HENT: Negative for neck pain.   Respiratory: Negative for shortness of breath.   Cardiovascular: Negative for chest pain.  Gastrointestinal: Positive for abdominal pain. Negative for nausea, vomiting, diarrhea and constipation.  Genitourinary: Positive for dysuria. Negative for frequency and flank pain.  Musculoskeletal: Negative for myalgias and back pain.  Skin: Negative for rash and wound.  Neurological: Positive for dizziness and light-headedness. Negative for seizures, syncope, speech difficulty, numbness and headaches.  All other systems reviewed and are negative.    Allergies  Review of patient's allergies indicates no known  allergies.  Home Medications   Current Outpatient Rx  Name  Route  Sig  Dispense  Refill  . benzocaine (ORABASE-B) 20 % PSTE   Other   1 application by Other route 4 (four) times daily as needed (boil ease pain relieving ointment).         . bismuth subsalicylate (PEPTO BISMOL) 262 MG/15ML suspension   Oral   Take 30 mLs by mouth every 6 (six) hours as needed for indigestion.         . Camphor-Menthol-Methyl Sal (BEN GAY ULTRA STRENGTH) 05-04-28 % CREA   Apply externally   Apply 1 application topically as needed (pain joints).          . hydrocortisone 1 % ointment   Topical   Apply 1 application topically as needed (itching).          . naproxen sodium (ANAPROX) 220 MG tablet   Oral   Take 440 mg by mouth 2 (two) times daily as needed (for pain).         . phenazopyridine (PYRIDIUM) 200 MG tablet   Oral   Take 200 mg by mouth 3 (three) times daily as needed for pain.         . ranitidine (ZANTAC) 75 MG tablet   Oral   Take 75 mg by mouth as needed for heartburn.          BP 128/52  Pulse 89  Temp(Src) 99.1 F (37.3 C) (Oral)  Resp 20  SpO2 99% Physical Exam  Nursing note and vitals reviewed. Constitutional: He is oriented to person, place,  and time. He appears well-developed and well-nourished. No distress.  HENT:  Head: Normocephalic and atraumatic.  Mouth/Throat: Oropharynx is clear and moist.  Eyes: EOM are normal. Pupils are equal, round, and reactive to light.  Neck: Normal range of motion. Neck supple.  Cardiovascular: Normal rate and regular rhythm.   Pulmonary/Chest: Effort normal and breath sounds normal. No respiratory distress. He has no wheezes. He has no rales.  Abdominal: Soft. Bowel sounds are normal. He exhibits no distension and no mass. There is no tenderness. There is no rebound and no guarding.  Genitourinary: Guaiac positive stool.  Dark stool in rectum   Musculoskeletal: Normal range of motion. He exhibits no edema and no  tenderness.  Neurological: He is alert and oriented to person, place, and time.  Skin: Skin is warm and dry. No rash noted. No erythema.  Psychiatric: He has a normal mood and affect. His behavior is normal.    ED Course   Procedures (including critical care time)  Labs Reviewed  CBC WITH DIFFERENTIAL - Abnormal; Notable for the following:    WBC 3.3 (*)    RBC 2.24 (*)    Hemoglobin 6.6 (*)    HCT 19.5 (*)    Neutro Abs 1.6 (*)    Lymphocytes Relative 49 (*)    All other components within normal limits  COMPREHENSIVE METABOLIC PANEL - Abnormal; Notable for the following:    Sodium 132 (*)    Albumin 3.0 (*)    GFR calc non Af Amer 70 (*)    GFR calc Af Amer 81 (*)    All other components within normal limits  LIPASE, BLOOD  URINALYSIS, ROUTINE W REFLEX MICROSCOPIC  PROTIME-INR  APTT  TYPE AND SCREEN  PREPARE RBC (CROSSMATCH)   Dg Abd Acute W/chest  08/30/2012   *RADIOLOGY REPORT*  Clinical Data: Abdominal pain.  ACUTE ABDOMEN SERIES (ABDOMEN 2 VIEW & CHEST 1 VIEW)  Comparison: CT abdomen and pelvis 05/01/2012.  Findings: Single view of the chest demonstrates clear lungs and normal heart size.  No pneumothorax or pleural fluid.  Two views of the abdomen show no free intraperitoneal air.  No evidence of bowel obstruction is identified.  Ovoid calcifications in the pelvis correspond to urinary bladder stones seen on prior CT scan.  IMPRESSION:  1.  No acute finding. 2.  Urinary bladder stones as seen on prior CT scan.   Original Report Authenticated By: Holley Dexter, M.D.   1. Anemia   2. GI bleed     Date: 08/30/2012  Rate: 66  Rhythm: normal sinus rhythm  QRS Axis: normal  Intervals: normal  ST/T Wave abnormalities: normal  Conduction Disutrbances:none  Narrative Interpretation:   Old EKG Reviewed: unchanged   MDM  Will consult [facility] to admit for symptomatic anemia.    Loren Racer, MD 08/30/12 [phone]

## 2012-08-30 NOTE — ED Notes (Signed)
Patient transported to X-ray 

## 2012-08-31 ENCOUNTER — Encounter (HOSPITAL_COMMUNITY): Payer: Self-pay | Admitting: Internal Medicine

## 2012-08-31 DIAGNOSIS — K922 Gastrointestinal hemorrhage, unspecified: Secondary | ICD-10-CM

## 2012-08-31 DIAGNOSIS — D649 Anemia, unspecified: Secondary | ICD-10-CM | POA: Diagnosis present

## 2012-08-31 DIAGNOSIS — N390 Urinary tract infection, site not specified: Principal | ICD-10-CM

## 2012-08-31 DIAGNOSIS — D61818 Other pancytopenia: Secondary | ICD-10-CM | POA: Diagnosis present

## 2012-08-31 HISTORY — DX: Anemia, unspecified: D64.9

## 2012-08-31 LAB — OCCULT BLOOD, POC DEVICE: Fecal Occult Bld: POSITIVE — AB

## 2012-08-31 LAB — PROTIME-INR: Prothrombin Time: 13.3 seconds (ref 11.6–15.2)

## 2012-08-31 MED ORDER — PANTOPRAZOLE SODIUM 40 MG IV SOLR: 40.0000 mg | Freq: Two times a day (BID) | INTRAVENOUS | Status: DC

## 2012-08-31 MED ORDER — SODIUM CHLORIDE 0.9 % IV SOLN
8.0000 mg/h | INTRAVENOUS | Status: DC
  Filled 2012-08-31: qty 80

## 2012-08-31 MED ORDER — SODIUM CHLORIDE 0.9 % IV SOLN: INTRAVENOUS | Status: DC

## 2012-08-31 MED ORDER — ACETAMINOPHEN 325 MG PO TABS: 650.0000 mg | ORAL_TABLET | Freq: Four times a day (QID) | ORAL | Status: DC | PRN

## 2012-08-31 MED ORDER — ONDANSETRON HCL 4 MG/2ML IJ SOLN: 4.0000 mg | Freq: Four times a day (QID) | INTRAMUSCULAR | Status: DC | PRN

## 2012-08-31 MED ORDER — ACETAMINOPHEN 650 MG RE SUPP: 650.0000 mg | Freq: Four times a day (QID) | RECTAL | Status: DC | PRN

## 2012-08-31 MED ORDER — ONDANSETRON HCL 4 MG PO TABS: 4.0000 mg | ORAL_TABLET | Freq: Four times a day (QID) | ORAL | Status: DC | PRN

## 2012-08-31 MED ORDER — DEXTROSE 5 % IV SOLN
1.0000 g | Freq: Every day | INTRAVENOUS | Status: DC
  Filled 2012-08-31: qty 10

## 2012-08-31 MED ORDER — SODIUM CHLORIDE 0.9 % IJ SOLN: 3.0000 mL | Freq: Two times a day (BID) | INTRAMUSCULAR | Status: DC

## 2012-08-31 NOTE — H&P (Signed)
[facility] History and Physical  [name] ZOX:[id number] DOB: 09-Mar-1940 DOA: 08/30/2012  Referring physician: ER physician. PCP: No PCP Per Patient   Chief Complaint: Dysuria and dizziness.  Patient is a poor historian.  HPI: [name] is a 72 y.o. male with history of schizophrenia, hepatitis C and previous history of GI bleed admitted in April of this year called EMS yesterday after patient started experiencing dizziness dysuria and some chest pressure. Patient states that he has been having these symptoms for some days. Had one episode of vomiting 2 days ago but denies any abdominal pain or diarrhea. In the ER patient was found to have severe anemia with hemoglobin around 6 and stool for blood was positive. In addition urine shows features of UTI. Patient has been admitted for GI bleed. Patient at this time denies any chest pain but states that he gets chest pressure off and on. Denies any shortness of breath productive cough fever chills.  Review of Systems: As presented in the history of presenting illness, rest negative.  Past Medical History  Diagnosis Date  . Schizophrenia   . Hepatitis C   . Benign hypertrophy of prostate   . Urinary tract infection    Past Surgical History  Procedure Laterality Date  . Knee surgery      right   Social History:  reports that he quit smoking about 37 years ago. He does not have any smokeless tobacco history on file. He reports that he does not drink alcohol or use illicit drugs. Home with his sister. where does patient live-- Not sure. Can patient participate in ADLs?  No Known Allergies  History reviewed. No pertinent family history.    Prior to Admission medications   Medication Sig Start Date End Date Taking? Authorizing Provider  benzocaine (ORABASE-B) 20 % PSTE 1 application by Other route 4 (four) times daily as needed (boil ease pain relieving ointment).   Yes Historical Provider, MD  bismuth subsalicylate (PEPTO  BISMOL) 262 MG/15ML suspension Take 30 mLs by mouth every 6 (six) hours as needed for indigestion.   Yes Historical Provider, MD  Camphor-Menthol-Methyl Sal (BEN GAY ULTRA STRENGTH) 05-04-28 % CREA Apply 1 application topically as needed (pain joints).    Yes Historical Provider, MD  hydrocortisone 1 % ointment Apply 1 application topically as needed (itching).    Yes Historical Provider, MD  naproxen sodium (ANAPROX) 220 MG tablet Take 440 mg by mouth 2 (two) times daily as needed (for pain).   Yes Historical Provider, MD  phenazopyridine (PYRIDIUM) 200 MG tablet Take 200 mg by mouth 3 (three) times daily as needed for pain.   Yes Historical Provider, MD  ranitidine (ZANTAC) 75 MG tablet Take 75 mg by mouth as needed for heartburn.   Yes Historical Provider, MD   Physical Exam: Filed Vitals:   08/30/12 2333 08/30/12 2342 08/30/12 2351 08/31/12 0015  BP:    113/74  Pulse: 82  87 70  Temp:    98.2 F (36.8 C)  TempSrc:    Oral  Resp: 21 14 18 16   Height:    5\' 8"  (1.727 m)  Weight:    76 kg (167 lb 8.8 oz)  SpO2: 100%  100% 100%     General:  Well-developed and moderately nourished.  Eyes: Anicteric mild pallor.  ENT: No discharge from the ears eyes nose mouth.  Neck: No mass felt.  Cardiovascular: S1-S2 heard.  Respiratory: No rhonchi or crepitations.  Abdomen: Soft nontender bowel  sounds present.  Skin: No rash.  Musculoskeletal: No edema.  Psychiatric: Alert awake oriented to time place and person.  Neurologic: Follows commands. Moves all extremities.  Labs on Admission:  Basic Metabolic Panel:  Recent Labs Lab 08/30/12 2242  NA 132*  K 4.1  CL 101  CO2 24  GLUCOSE 99  BUN 15  CREATININE 1.05  CALCIUM 9.0   Liver Function Tests:  Recent Labs Lab 08/30/12 2242  AST 23  ALT 15  ALKPHOS 46  BILITOT 0.4  PROT 6.1  ALBUMIN 3.0*    Recent Labs Lab 08/30/12 2242  LIPASE 19   No results found for this basename: AMMONIA,  in the last 168  hours CBC:  Recent Labs Lab 08/30/12 2242  WBC 3.3*  NEUTROABS 1.6*  HGB 6.6*  HCT 19.5*  MCV 87.1  PLT 15*   Cardiac Enzymes: No results found for this basename: CKTOTAL, CKMB, CKMBINDEX, TROPONINI,  in the last 168 hours  BNP (last 3 results) No results found for this basename: PROBNP,  in the last 8760 hours CBG: No results found for this basename: GLUCAP,  in the last 168 hours  Radiological Exams on Admission: Dg Abd Acute W/chest  08/30/2012   *RADIOLOGY REPORT*  Clinical Data: Abdominal pain.  ACUTE ABDOMEN SERIES (ABDOMEN 2 VIEW & CHEST 1 VIEW)  Comparison: CT abdomen and pelvis 05/01/2012.  Findings: Single view of the chest demonstrates clear lungs and normal heart size.  No pneumothorax or pleural fluid.  Two views of the abdomen show no free intraperitoneal air.  No evidence of bowel obstruction is identified.  Ovoid calcifications in the pelvis correspond to urinary bladder stones seen on prior CT scan.  IMPRESSION:  1.  No acute finding. 2.  Urinary bladder stones as seen on prior CT scan.   Original Report Authenticated By: Holley Dexter, M.D.    EKG: Independently reviewed. Normal sinus rhythm.  Assessment/Plan Active Problems:   UTI (lower urinary tract infection)   Schizoaffective disorder, bipolar type   GI bleed   Pancytopenia   1. GI bleed - concerning for upper GI bleed as patient takes naproxen. Patient had similar episode in April this year but further GI workup was deferred as outpatient. At this time patient has been placed on Protonix infusion and transfused 2 units of packed red blood cells. Patient may also need platelet transfusion due to severe thrombocytopenia. Closely follow CBC. Consult GI. Presently patient is hemodynamically stable and in anticipation of possible procedure patient will be kept n.p.o. 2. Pancytopenia - patient has severe anemia and thrombocytopenia. Closely follow CBC. Hematology consult in a.m. 3. History of schizophrenia -  presently on no medications. 4. UTI - check urine cultures. Patient has been placed on ceftriaxone. Patient states that he has nephrolithiasis and is supposed to follow with neurologist for further workup.    Code Status: Full code.  Family Communication: None.  Disposition Plan: Admit to inpatient.    KAKRAKANDY,ARSHAD N. [facility] Pager [phone].  If 7PM-7AM, please contact night-coverage [email or web address] Password [email or web address] 08/31/2012, 1:04 AM

## 2012-08-31 NOTE — Progress Notes (Signed)
The staff on [address] notified the MD that the patient had left the floor AMA.  The patient did receive the cash and credit cards(2) prior to leaving AMA.(AMA papers were signed).

## 2012-08-31 NOTE — Progress Notes (Signed)
Patient gave RN [phone] and 2  Credit cards to have locked in the safe. Security was at the bedside witnessing.   The envelope (with the cash and credit cards) was taken to security to be placed in the safe.  While the RN was in the security office, the RN was notified that the patient wanted to leave AMA.

## 2012-08-31 NOTE — Progress Notes (Signed)
Patient was admitted on [address]. The MD came to see patient.

## 2012-08-31 NOTE — Care Management Note (Signed)
    Page 1 of 1   08/31/2012     10:46:18 AM   CARE MANAGEMENT NOTE 08/31/2012  Patient:  [name], [name]   Account Number:  [id number]  Date Initiated:  08/31/2012  Documentation initiated by:  Lanier Clam  Subjective/Objective Assessment:   ADMITTED W/DYSURIA,DIZZINESS.     Action/Plan:   FROM HOME.   Anticipated DC Date:  08/31/2012   Anticipated DC Plan:  AGAINST MEDICAL ADVICE      DC Planning Services  CM consult      Choice offered to / List presented to:             Status of service:  Completed, signed off Medicare Important Message given?   (If response is "NO", the following Medicare IM given date fields will be blank) Date Medicare IM given:   Date Additional Medicare IM given:    Discharge Disposition:  AGAINST MEDICAL ADVICE  Per UR Regulation:  Reviewed for med. necessity/level of care/duration of stay  If discussed at Long Length of Stay Meetings, dates discussed:    Comments:

## 2012-09-01 LAB — URINE CULTURE: Colony Count: >=100000

## 2012-09-02 ENCOUNTER — Telehealth (HOSPITAL_COMMUNITY): Payer: Self-pay | Admitting: Emergency Medicine

## 2012-09-02 LAB — URINE CULTURE: Colony Count: >=100000

## 2012-09-02 NOTE — ED Notes (Signed)
Post ED Visit - Positive Culture Follow-up: Successful Patient Follow-Up  Culture assessed and recommendations reviewed by: []  Wes Dulaney, Pharm.D., BCPS []  Celedonio Miyamoto, [address].D., BCPS []  Georgina Pillion, Pharm.D., BCPS []  [place], [address].D., BCPS, AAHIVP [x]  Estella Husk, Pharm.D., BCPS, AAHIVP  Positive urine culture  [x]  Patient discharged without antimicrobial prescription and treatment is now indicated []  Organism is resistant to prescribed ED discharge antimicrobial []  Patient with positive blood cultures  Changes discussed with ED provider: Marlon Pel PA-c New antibiotic prescription: Septra DS 1 tab PO BID x 10 days    Kylie A Holland 09/02/2012, 5:00 PM

## 2012-09-02 NOTE — Progress Notes (Signed)
ED Antimicrobial Stewardship Positive Culture Follow Up   [name] is an 73 y.o. male who presented to [facility] on 08/30/2012 with a chief complaint of  Chief Complaint  Patient presents with  . Dysuria    Recent Results (from the past 720 hour(s))  URINE CULTURE     Status: None   Collection Time    08/29/12  8:21 PM      Result Value Range Status   Specimen Description URINE, CLEAN CATCH   Final   Special Requests NONE   Final   Culture  Setup Time     Final   Value: 08/29/2012 21:10     Performed at [facility] Count     Final   Value: >=100,000 COLONIES/ML     Performed at [facility]   Culture     Final   Value: STAPHYLOCOCCUS SPECIES (COAGULASE NEGATIVE)     Note: RIFAMPIN AND GENTAMICIN SHOULD NOT BE USED AS SINGLE DRUGS FOR TREATMENT OF STAPH INFECTIONS.     Performed at [facility]   Report Status 09/01/2012 FINAL   Final   Organism ID, Bacteria STAPHYLOCOCCUS SPECIES (COAGULASE NEGATIVE)   Final  URINE CULTURE     Status: None   Collection Time    08/30/12 10:14 PM      Result Value Range Status   Specimen Description URINE, CLEAN CATCH   Final   Special Requests NONE   Final   Culture  Setup Time     Final   Value: 08/31/2012 04:57     Performed at [facility] Count PENDING   Incomplete   Culture     Final   Value: Culture reincubated for better growth     Performed at [facility]   Report Status PENDING   Incomplete    [x]  Patient discharged originally without antimicrobial agent and treatment is now indicated.  In April 2014 ID recommended checking Septra susceptibility on his Coagulase-negative Staph and treating with Septra (it was susceptible to Septra).  New antibiotic prescription: Septra DS 1 tab PO BID x 10 days  ED Provider: Marlon Pel, PA-C   Sallee Provencal 09/02/2012, 4:51 PM Infectious Diseases Pharmacist Phone# [phone]

## 2012-09-03 ENCOUNTER — Telehealth (HOSPITAL_COMMUNITY): Payer: Self-pay | Admitting: Emergency Medicine

## 2012-09-03 LAB — TYPE AND SCREEN
ABO/RH(D): O POS
Antibody Screen: NEGATIVE
Unit division: 0
Unit division: 0

## 2012-09-05 NOTE — ED Notes (Signed)
Patient was admitted

## 2012-09-14 NOTE — Discharge Summary (Signed)
  Discharge summary:  [name] is a 72 y.o. male with history of schizophrenia, hepatitis C and previous history of GI bleed admitted in April of this year called EMS yesterday after patient started experiencing dizziness dysuria and some chest pressure. Patient states that he has been having these symptoms for some days. Had one episode of vomiting 2 days ago but denies any abdominal pain or diarrhea. In the ER patient was found to have severe anemia with hemoglobin around 6 and stool for blood was positive. In addition urine shows features of UTI. Patient has been admitted for GI bleed. Patient at this time denies any chest pain but states that he gets chest pressure off and on. Denies any shortness of breath productive cough fever chills.  Close in the hospital: Patient was admitted to the floor and placed on Protonix infusion and antibiotics. Patient's CBC was ordered for serial checks. But before further plans could be done the nurse notified that the patient left AMA.  Discharge diagnosis :  #1. Acute GI bleed. #2. Possible UTI. #3. History of schizophrenia.  Patient left AMA.

## 2013-02-06 ENCOUNTER — Encounter (HOSPITAL_COMMUNITY): Payer: Self-pay | Admitting: Emergency Medicine

## 2013-02-06 ENCOUNTER — Inpatient Hospital Stay (HOSPITAL_COMMUNITY)
Admission: EM | Admit: 2013-02-06 | Discharge: 2013-02-07 | DRG: 379 | Disposition: A | Discharge status: Home / Self Care | Payer: Medicare Other | Attending: Internal Medicine | Admitting: Internal Medicine

## 2013-02-06 ENCOUNTER — Emergency Department (HOSPITAL_COMMUNITY): Payer: Medicare Other

## 2013-02-06 DIAGNOSIS — K922 Gastrointestinal hemorrhage, unspecified: Principal | ICD-10-CM

## 2013-02-06 DIAGNOSIS — Z87891 Personal history of nicotine dependence: Secondary | ICD-10-CM

## 2013-02-06 DIAGNOSIS — R1013 Epigastric pain: Secondary | ICD-10-CM | POA: Diagnosis present

## 2013-02-06 DIAGNOSIS — E876 Hypokalemia: Secondary | ICD-10-CM

## 2013-02-06 DIAGNOSIS — R109 Unspecified abdominal pain: Secondary | ICD-10-CM

## 2013-02-06 DIAGNOSIS — N179 Acute kidney failure, unspecified: Secondary | ICD-10-CM

## 2013-02-06 DIAGNOSIS — D509 Iron deficiency anemia, unspecified: Secondary | ICD-10-CM | POA: Diagnosis present

## 2013-02-06 DIAGNOSIS — F250 Schizoaffective disorder, bipolar type: Secondary | ICD-10-CM

## 2013-02-06 DIAGNOSIS — B182 Chronic viral hepatitis C: Secondary | ICD-10-CM | POA: Diagnosis present

## 2013-02-06 DIAGNOSIS — F209 Schizophrenia, unspecified: Secondary | ICD-10-CM | POA: Diagnosis present

## 2013-02-06 DIAGNOSIS — D500 Iron deficiency anemia secondary to blood loss (chronic): Secondary | ICD-10-CM | POA: Diagnosis present

## 2013-02-06 DIAGNOSIS — D649 Anemia, unspecified: Secondary | ICD-10-CM

## 2013-02-06 DIAGNOSIS — N390 Urinary tract infection, site not specified: Secondary | ICD-10-CM

## 2013-02-06 DIAGNOSIS — B1920 Unspecified viral hepatitis C without hepatic coma: Secondary | ICD-10-CM

## 2013-02-06 DIAGNOSIS — D61818 Other pancytopenia: Secondary | ICD-10-CM

## 2013-02-06 HISTORY — DX: Hypokalemia: E87.6

## 2013-02-06 LAB — COMPREHENSIVE METABOLIC PANEL
ALT: 10 U/L (ref 0–53)
AST: 15 U/L (ref 0–37)
Albumin: 3.3 g/dL — ABNORMAL LOW (ref 3.5–5.2)
Alkaline Phosphatase: 69 U/L (ref 39–117)
BUN: 12 mg/dL (ref 6–23)
CALCIUM: 9.6 mg/dL (ref 8.4–10.5)
CO2: 25 mEq/L (ref 19–32)
Chloride: 104 mEq/L (ref 96–112)
Creatinine, Ser: 0.96 mg/dL (ref 0.50–1.35)
GFR calc Af Amer: >90 mL/min (ref >90)
GFR calc non Af Amer: 82 mL/min — ABNORMAL LOW (ref >90)
Glucose, Bld: 108 mg/dL — ABNORMAL HIGH (ref 70–99)
Potassium: 3.4 mEq/L — ABNORMAL LOW (ref 3.7–5.3)
SODIUM: 139 meq/L (ref 137–147)
TOTAL PROTEIN: 7.4 g/dL (ref 6.0–8.3)
Total Bilirubin: 0.3 mg/dL (ref 0.3–1.2)

## 2013-02-06 LAB — CBC WITH DIFFERENTIAL/PLATELET
BASOS ABS: 0.1 10*3/uL (ref 0.0–0.1)
Basophils Relative: 1 % (ref 0–1)
EOS ABS: 0.1 10*3/uL (ref 0.0–0.7)
Eosinophils Relative: 1 % (ref 0–5)
HCT: 19.9 % — ABNORMAL LOW (ref 39.0–52.0)
Hemoglobin: 5.4 g/dL — CL (ref 13.0–17.0)
Lymphocytes Relative: 15 % (ref 12–46)
Lymphs Abs: 1 10*3/uL (ref 0.7–4.0)
MCH: 18.6 pg — AB (ref 26.0–34.0)
MCHC: 27.1 g/dL — AB (ref 30.0–36.0)
MCV: 68.6 fL — ABNORMAL LOW (ref 78.0–100.0)
MONO ABS: 0.4 10*3/uL (ref 0.1–1.0)
Monocytes Relative: 7 % (ref 3–12)
NEUTROS PCT: 76 % (ref 43–77)
Neutro Abs: 4.8 10*3/uL (ref 1.7–7.7)
PLATELETS: 349 10*3/uL (ref 150–400)
RBC: 2.9 MIL/uL — AB (ref 4.22–5.81)
RDW: 20.6 % — ABNORMAL HIGH (ref 11.5–15.5)
WBC: 6.4 10*3/uL (ref 4.0–10.5)

## 2013-02-06 LAB — TROPONIN I: Troponin I: <0.3 ng/mL (ref <0.30)

## 2013-02-06 LAB — CBC
HCT: 19.8 % — ABNORMAL LOW (ref 39.0–52.0)
Hemoglobin: 5.3 g/dL — CL (ref 13.0–17.0)
MCH: 18.7 pg — ABNORMAL LOW (ref 26.0–34.0)
MCHC: 26.8 g/dL — AB (ref 30.0–36.0)
MCV: 69.7 fL — AB (ref 78.0–100.0)
PLATELETS: 323 10*3/uL (ref 150–400)
RBC: 2.84 MIL/uL — ABNORMAL LOW (ref 4.22–5.81)
RDW: 20.8 % — AB (ref 11.5–15.5)
WBC: 6.1 10*3/uL (ref 4.0–10.5)

## 2013-02-06 LAB — PREPARE RBC (CROSSMATCH)

## 2013-02-06 LAB — LIPASE, BLOOD: Lipase: 21 U/L (ref 11–59)

## 2013-02-06 MED ORDER — SODIUM CHLORIDE 0.9 % IV SOLN: INTRAVENOUS | Status: DC

## 2013-02-06 MED ORDER — SODIUM CHLORIDE 0.9 % IV SOLN
80.0000 mg | Freq: Once | INTRAVENOUS | Status: AC
  Administered 2013-02-06: 80 mg via INTRAVENOUS
  Filled 2013-02-06: qty 80

## 2013-02-06 MED ORDER — ACETAMINOPHEN 650 MG RE SUPP: 650.0000 mg | Freq: Four times a day (QID) | RECTAL | Status: DC | PRN

## 2013-02-06 MED ORDER — ACETAMINOPHEN 325 MG PO TABS
650.0000 mg | ORAL_TABLET | Freq: Once | ORAL | Status: AC
  Administered 2013-02-06: 650 mg via ORAL
  Filled 2013-02-06: qty 2

## 2013-02-06 MED ORDER — ACETAMINOPHEN 325 MG PO TABS: 650.0000 mg | ORAL_TABLET | Freq: Four times a day (QID) | ORAL | Status: DC | PRN

## 2013-02-06 MED ORDER — POTASSIUM CHLORIDE 10 MEQ/100ML IV SOLN
10.0000 meq | INTRAVENOUS | Status: AC
  Administered 2013-02-06: 10 meq via INTRAVENOUS
  Filled 2013-02-06: qty 100

## 2013-02-06 MED ORDER — PANTOPRAZOLE SODIUM 40 MG PO TBEC
40.0000 mg | DELAYED_RELEASE_TABLET | Freq: Two times a day (BID) | ORAL | Status: DC
  Filled 2013-02-06: qty 1

## 2013-02-06 MED ORDER — DIPHENHYDRAMINE HCL 25 MG PO CAPS
25.0000 mg | ORAL_CAPSULE | Freq: Once | ORAL | Status: AC
  Administered 2013-02-06: 25 mg via ORAL
  Filled 2013-02-06: qty 1

## 2013-02-06 MED ORDER — CALCIUM CARBONATE ANTACID 500 MG PO CHEW
5.0000 | CHEWABLE_TABLET | Freq: Four times a day (QID) | ORAL | Status: DC | PRN
  Administered 2013-02-06: 1400 mg via ORAL
  Filled 2013-02-06 (×2): qty 10

## 2013-02-06 MED ORDER — FUROSEMIDE 10 MG/ML IJ SOLN
20.0000 mg | Freq: Once | INTRAMUSCULAR | Status: AC
  Administered 2013-02-06: 20 mg via INTRAVENOUS
  Filled 2013-02-06: qty 2

## 2013-02-06 MED ORDER — HALOPERIDOL 1 MG PO TABS
1.0000 mg | ORAL_TABLET | Freq: Two times a day (BID) | ORAL | Status: DC
  Administered 2013-02-06: 1 mg via ORAL
  Filled 2013-02-06 (×3): qty 1

## 2013-02-06 NOTE — ED Notes (Signed)
Pt presents to department for evaluation of abdominal pain x1 month. Also states vomiting and diarrhea. 5/10 pain at the time. Pt is alert and oriented x4.

## 2013-02-06 NOTE — H&P (Addendum)
[facility] History and Physical  [name] ZOX:[id number] DOB: 10-Apr-1940 DOA: 02/06/2013  Referring physician: Oletta Cohn PCP: No PCP Per Patient   Chief Complaint: Heartburn  HPI: [name] is a 73 y.o. male  Presents with heartburn worsening for the past month or so. He has been taking frequent TUMS and reports he came to the emergency room to get a prescription for heartburn medication. he has a history of schizophrenia and refused a rectal exam. Hemoglobin was noted to be 5.4. He denies vomiting, but does have what sounds like reflux symptoms. Denies melena or hematochezia. He also reports a sensation of "choking on the inside". He is agreeable to blood transfusion and admission. He was admitted April 2014 with a similar presentation. Hemoglobin was about 5 at that time as well. GI was consulted, but patient never gave consent for endoscopy. The plan was to get him stabilized from a psychiatric standpoint and then do outpatient GI evaluation. Patient reports that he has never had endoscopy and does not want to have endoscopy during this hospitalization. He denies nonsteroidal anti-inflammatories. He reportedly has a history of hepatitis C. Anemia panel, SPEP last year fairly unremarkable. Has felt some chest pain with exertion.  Also, he reports that he called behavioral health because he has been "depressed and needs some help". He denies suicidal or homicidal ideation. He does hear voices speaking to him, spirits emanating from within his abdomen. He would like these voices to go away.  Review of Systems:  Systems reviewed. As above otherwise negative.  Past Medical History  Diagnosis Date  . Schizophrenia   . Hepatitis C   . Benign hypertrophy of prostate   . Urinary tract infection    Past Surgical History  Procedure Laterality Date  . Knee surgery      right   Social History:  reports that he quit smoking about 37 years ago. He does not have any smokeless tobacco  history on file. He reports that he does not drink alcohol or use illicit drugs. he stays with his sister.  No Known Allergies  Family history: Patient will not answer this question.   Prior to Admission medications   Medication Sig Start Date End Date Taking? Authorizing Provider  calcium carbonate (TUMS - DOSED IN MG ELEMENTAL CALCIUM) 500 MG chewable tablet Chew 5-10 tablets by mouth 4 (four) times daily as needed for indigestion or heartburn.   Yes Historical Provider, MD   Physical Exam: Filed Vitals:   02/06/13 1710  BP:   Pulse: 85  Temp:   Resp: 14    BP 115/62  Pulse 85  Temp(Src) 97.9 F (36.6 C) (Oral)  Resp 14  Ht 5\' 9"  (1.753 m)  Wt 72.576 kg (160 lb)  BMI 23.62 kg/m2  SpO2 100%  BP 119/71  Pulse 81  Temp(Src) 97.9 F (36.6 C) (Oral)  Resp 13  Ht 5\' 9"  (1.753 m)  Wt 72.576 kg (160 lb)  BMI 23.62 kg/m2  SpO2 100%  General Appearance:    Alert, cooperative, speaks in a loud voice. Answers most, but not all questions.   Head:    Normocephalic, without obvious abnormality, atraumatic  Eyes:    PERRL,  pale conjunctiva. Sclera nonicteric.        Nose:   Nares normal, septum midline, mucosa normal, no drainage   or sinus tenderness  Throat:   Lips, mucosa, and tongue normal; teeth and gums normal  Neck:   Supple, symmetrical, trachea midline, no  adenopathy;       thyroid:  No enlargement/tenderness/nodules; no carotid   bruit or JVD  Back:     Symmetric, no curvature, ROM normal, no CVA tenderness  Lungs:     Clear to auscultation bilaterally, respirations unlabored  Chest wall:    No tenderness or deformity  Heart:    Regular rate and rhythm, S1 and S2 normal, no murmur, rub   or gallop  Abdomen:     Soft, non-tender, bowel sounds active all four quadrants,    no masses, no organomegaly  Genitalia:   deferred   Rectal:   patient refused   Extremities:   Extremities normal, atraumatic, no cyanosis or edema  Pulses:   2+ and symmetric all extremities   Skin:   Skin color, texture, turgor normal, no rashes or lesions  Lymph nodes:   Cervical, supraclavicular, and axillary nodes normal  Neurologic:   CNII-XII intact. Normal strength, sensation and reflexes      throughout             psychiatric: loud monotonous voice. Cooperative. Sometimes stares ahead and does not answer certain questions, particularly when asked about his family.  Labs on Admission:  Basic Metabolic Panel:  Recent Labs Lab 02/06/13 1240  NA 139  K 3.4*  CL 104  CO2 25  GLUCOSE 108*  BUN 12  CREATININE 0.96  CALCIUM 9.6   Liver Function Tests:  Recent Labs Lab 02/06/13 1240  AST 15  ALT 10  ALKPHOS 69  BILITOT 0.3  PROT 7.4  ALBUMIN 3.3*    Recent Labs Lab 02/06/13 1240  LIPASE 21   No results found for this basename: AMMONIA,  in the last 168 hours CBC:  Recent Labs Lab 02/06/13 1240 02/06/13 1353  WBC 6.4 6.1  NEUTROABS 4.8  --   HGB 5.4* 5.3*  HCT 19.9* 19.8*  MCV 68.6* 69.7*  PLT 349 323   Cardiac Enzymes:  Recent Labs Lab 02/06/13 1240  TROPONINI <0.30    BNP (last 3 results) No results found for this basename: PROBNP,  in the last 8760 hours CBG: No results found for this basename: GLUCAP,  in the last 168 hours  Radiological Exams on Admission: Dg Abd Acute W/chest  02/06/2013   CLINICAL DATA:  Abdominal pain, nausea and heartburn.  EXAM: ACUTE ABDOMEN SERIES (ABDOMEN 2 VIEW & CHEST 1 VIEW)  COMPARISON:  08/30/2012.  FINDINGS: Mild central pulmonary vascular prominence. No infiltrate, congestive heart failure or pneumothorax.  Heart size top-normal.  Tortuous aorta.  Radiopaque material within the colon may be related to recent ingestion of the barium or medication.  Gas-filled slightly prominent small bowel loops central aspect of the abdomen.  Predominantly fluid-filled stomach with gastric fold thickening suspected.  No free intraperitoneal air.  Bladder calculi.  IMPRESSION: Nonspecific bowel gas pattern as  detailed above.  Bladder calculi.   Electronically Signed   By: Bridgett Larsson M.D.   On: 02/06/2013 13:30    EKG: Sinus tachycardia Abnormal R-wave progression, early transition  Assessment/Plan Principal Problem:   Microcytic anemia in the setting of heartburn symptoms, hep c and previous GIB/heme positive stool: agreeable to transfusion. Not agreeable to endoscopy. 2 units PRBC ordered. Will give PPI BID. Regular diet for now. Does not appear to have overt GIB at this time, and is hemodynamically stable. Would like to get EGD/colonoscopy at some point, but schizophrenia has made obtaining consent currently, and in the past, difficult Active Problems:   Hepatitis  C   Hypokalemia: replete IV   Epigastric pain   Schizophrenia: hears voices and requesting psychiatric help for "depression". Denies suicidal or homicidal ideation. Currently, fairly cooperative.  Had been on haldol in the past. Will order 1 mg bid for now and consult psychiatry for recommendations  Code Status: full Family Communication:  Disposition Plan: Home  Time spent: 60 minutes  Sapir Lavey L [facility] Pager [phone]

## 2013-02-06 NOTE — ED Notes (Signed)
CRITICAL VALUE ALERT  Critical value received: Hemoglobin 5.4   Date of notification: 02/06/2013  Time of notification:  1443  Critical value read back: Yes  Nurse who received alert:  Christin BachBrittney Kaidan Spengler   MD notified (1st page): Md Pollina

## 2013-02-06 NOTE — ED Notes (Addendum)
Pt reports "sick stomach" and acid reflux x 1 month with no relief from Tums. Pt denies any N/V/D today and denies pain at this time. Reports intermittent abdominal pain but states that it feels like acid reflux.

## 2013-02-06 NOTE — ED Provider Notes (Signed)
CSN: [id number]     Arrival date & time 02/06/13  1154 History   First MD Initiated Contact with Patient 02/06/13 1206     Chief Complaint  Patient presents with  . Abdominal Pain   (Consider location/radiation/quality/duration/timing/severity/associated sxs/prior Treatment) HPI Comments: Plans to the ER for evaluation of abdominal pain. Patient reports that he has been having diffuse abdominal pain for more than a month. He says it has felt like heartburn and he has been using TUMS with relief but the symptoms come back. Patient has not had any vomiting of blood or black stools. At times she has discomfort up into the chest as well without shortness of breath. No current chest pain.  Patient does however, report that about 2 months ago he was walking and had sudden onset of heaviness on his chest. This pain has been recurrent intermittently and at times is secondary to exertion.  Patient is a 73 y.o. male presenting with abdominal pain.  Abdominal Pain Associated symptoms: chest pain   Associated symptoms: no shortness of breath     Past Medical History  Diagnosis Date  . Schizophrenia   . Hepatitis C   . Benign hypertrophy of prostate   . Urinary tract infection    Past Surgical History  Procedure Laterality Date  . Knee surgery      right   History reviewed. No pertinent family history. History  Substance Use Topics  . Smoking status: Former Smoker -- 1.00 packs/day for 20 years    Quit date: 06/11/1975  . Smokeless tobacco: Not on file  . Alcohol Use: No    Review of Systems  Respiratory: Negative for shortness of breath.   Cardiovascular: Positive for chest pain.  Gastrointestinal: Positive for abdominal pain.  All other systems reviewed and are negative.    Allergies  Review of patient's allergies indicates no known allergies.  Home Medications   Current Outpatient Rx  Name  Route  Sig  Dispense  Refill  . calcium carbonate (TUMS - DOSED IN MG ELEMENTAL  CALCIUM) 500 MG chewable tablet   Oral   Chew 5-10 tablets by mouth 4 (four) times daily as needed for indigestion or heartburn.          BP 136/60  Pulse 90  Temp(Src) 98.9 F (37.2 C) (Oral)  Resp 24  Ht 5\' 9"  (1.753 m)  Wt 160 lb (72.576 kg)  BMI 23.62 kg/m2  SpO2 97% Physical Exam  Constitutional: He is oriented to person, place, and time. He appears well-developed and well-nourished. No distress.  HENT:  Head: Normocephalic and atraumatic.  Right Ear: Hearing normal.  Left Ear: Hearing normal.  Nose: Nose normal.  Mouth/Throat: Oropharynx is clear and moist and mucous membranes are normal.  Eyes: Conjunctivae and EOM are normal. Pupils are equal, round, and reactive to light.  Neck: Normal range of motion. Neck supple.  Cardiovascular: Regular rhythm, S1 normal and S2 normal.  Exam reveals no gallop and no friction rub.   No murmur heard. Pulmonary/Chest: Effort normal and breath sounds normal. No respiratory distress. He exhibits no tenderness.  Abdominal: Soft. Normal appearance and bowel sounds are normal. There is no hepatosplenomegaly. There is generalized tenderness. There is no rebound, no guarding, no tenderness at McBurney's point and negative Murphy's sign. No hernia.  Musculoskeletal: Normal range of motion.  Neurological: He is alert and oriented to person, place, and time. He has normal strength. No cranial nerve deficit or sensory deficit. Coordination normal. GCS eye subscore  is 4. GCS verbal subscore is 5. GCS motor subscore is 6.  Skin: Skin is warm, dry and intact. No rash noted. No cyanosis.  Psychiatric: He has a normal mood and affect. His speech is normal and behavior is normal. Thought content normal.    ED Course  Procedures (including critical care time) Labs Review Labs Reviewed  CBC WITH DIFFERENTIAL - Abnormal; Notable for the following:    RBC 2.90 (*)    Hemoglobin 5.4 (*)    HCT 19.9 (*)    MCV 68.6 (*)    MCH 18.6 (*)    MCHC 27.1 (*)     RDW 20.6 (*)    All other components within normal limits  COMPREHENSIVE METABOLIC PANEL - Abnormal; Notable for the following:    Potassium 3.4 (*)    Glucose, Bld 108 (*)    Albumin 3.3 (*)    GFR calc non Af Amer 82 (*)    All other components within normal limits  CBC - Abnormal; Notable for the following:    RBC 2.84 (*)    Hemoglobin 5.3 (*)    HCT 19.8 (*)    MCV 69.7 (*)    MCH 18.7 (*)    MCHC 26.8 (*)    RDW 20.8 (*)    All other components within normal limits  TROPONIN I  LIPASE, BLOOD  TYPE AND SCREEN   Imaging Review Dg Abd Acute W/chest  02/06/2013   CLINICAL DATA:  Abdominal pain, nausea and heartburn.  EXAM: ACUTE ABDOMEN SERIES (ABDOMEN 2 VIEW & CHEST 1 VIEW)  COMPARISON:  08/30/2012.  FINDINGS: Mild central pulmonary vascular prominence. No infiltrate, congestive heart failure or pneumothorax.  Heart size top-normal.  Tortuous aorta.  Radiopaque material within the colon may be related to recent ingestion of the barium or medication.  Gas-filled slightly prominent small bowel loops central aspect of the abdomen.  Predominantly fluid-filled stomach with gastric fold thickening suspected.  No free intraperitoneal air.  Bladder calculi.  IMPRESSION: Nonspecific bowel gas pattern as detailed above.  Bladder calculi.   Electronically Signed   By: Bridgett LarssonSteve  Olson M.D.   On: 02/06/2013 13:30    EKG Interpretation    Date/Time:  Tuesday February 06 2013 12:42:02 EST Ventricular Rate:  105 PR Interval:  148 QRS Duration: 77 QT Interval:  315 QTC Calculation: 416 R Axis:   42 Text Interpretation:  Sinus tachycardia Abnormal R-wave progression, early transition Nonspecific ST and T wave abnormality Confirmed by POLLINA  MD, CHRISTOPHER ([id number]) on 02/06/2013 12:51:22 PM            MDM  Diagnosis: 1. Anemia 2. Abdominal pain 3. Suspected upper GI bleed  Presents to ER for evaluation of persistent abdominal pain and reflux type symptoms for more than a month. He has  been using TUMS daily with only partial response. Patient's blood work shows profound anemia. This is concerning for upper GI bleeding, but patient refuses rectal examination to confirm blood in stools. Will need to heme test stools, patient says he cannot have a bowel movement at this time.  Patient's pain with exertion might be secondary to his anemia. His EKG does not show any evidence of ischemia or infarct. Troponin was negative.  Patient will require admission for blood transfusion and further workup.   Gilda Creasehristopher J. Pollina, MD 02/06/13 [phone]

## 2013-02-06 NOTE — ED Notes (Signed)
CRITICAL VALUE ALERT  Critical value received:  *Hemoglobin 5.3  Date of notification:  02/06/2013  Time of notification:  1549  Critical value read back: Yes  Nurse who received alert:  Christin BachBrittney Natassja Ollis   MD notified (1st page):  Md Pollina

## 2013-02-06 NOTE — ED Notes (Signed)
ECG given to Dr. Blinda LeatherwoodPollina with older copy

## 2013-02-06 NOTE — ED Notes (Signed)
Admitting MD at bedside.

## 2013-02-06 NOTE — ED Notes (Signed)
Transporting patient to new room assignment. 

## 2013-02-06 NOTE — ED Notes (Signed)
Provider attempted to collect stool sample, but pt refused.

## 2013-02-06 NOTE — ED Notes (Signed)
Pt continues to eat Tums. Instructed pt to stop eating tums.

## 2013-02-07 DIAGNOSIS — K922 Gastrointestinal hemorrhage, unspecified: Principal | ICD-10-CM

## 2013-02-07 DIAGNOSIS — D649 Anemia, unspecified: Secondary | ICD-10-CM

## 2013-02-07 LAB — IRON AND TIBC
Iron: 66 ug/dL (ref 42–135)
SATURATION RATIOS: 19 % — AB (ref 20–55)
TIBC: 342 ug/dL (ref 215–435)
UIBC: 276 ug/dL (ref 125–400)

## 2013-02-07 LAB — TYPE AND SCREEN
ABO/RH(D): O POS
ANTIBODY SCREEN: NEGATIVE
UNIT DIVISION: 0
Unit division: 0

## 2013-02-07 LAB — FERRITIN: Ferritin: 4 ng/mL — ABNORMAL LOW (ref 22–322)

## 2013-02-07 LAB — BASIC METABOLIC PANEL
BUN: 13 mg/dL (ref 6–23)
CHLORIDE: 108 meq/L (ref 96–112)
CO2: 22 meq/L (ref 19–32)
Calcium: 8.5 mg/dL (ref 8.4–10.5)
Creatinine, Ser: 1.06 mg/dL (ref 0.50–1.35)
GFR calc non Af Amer: 69 mL/min — ABNORMAL LOW (ref >90)
GFR, EST AFRICAN AMERICAN: 80 mL/min — AB (ref >90)
Glucose, Bld: 90 mg/dL (ref 70–99)
Potassium: 3.7 mEq/L (ref 3.7–5.3)
Sodium: 140 mEq/L (ref 137–147)

## 2013-02-07 LAB — CBC
HCT: 22.6 % — ABNORMAL LOW (ref 39.0–52.0)
Hemoglobin: 6.7 g/dL — CL (ref 13.0–17.0)
MCH: 20.9 pg — ABNORMAL LOW (ref 26.0–34.0)
MCHC: 29.6 g/dL — ABNORMAL LOW (ref 30.0–36.0)
MCV: 70.6 fL — ABNORMAL LOW (ref 78.0–100.0)
PLATELETS: 273 10*3/uL (ref 150–400)
RBC: 3.2 MIL/uL — AB (ref 4.22–5.81)
RDW: 21.1 % — ABNORMAL HIGH (ref 11.5–15.5)
WBC: 6.3 10*3/uL (ref 4.0–10.5)

## 2013-02-07 LAB — FOLATE: Folate: 14.7 ng/mL

## 2013-02-07 LAB — VITAMIN B12: Vitamin B-12: 285 pg/mL (ref 211–911)

## 2013-02-07 MED ORDER — PANTOPRAZOLE SODIUM 40 MG PO TBEC: 40.0000 mg | DELAYED_RELEASE_TABLET | Freq: Every day | ORAL | Status: DC

## 2013-02-07 NOTE — Progress Notes (Signed)
Politely  Refusing  scd / and  Flu  Vaccine at this time

## 2013-02-07 NOTE — Care Management Note (Signed)
   CARE MANAGEMENT NOTE 02/07/2013  Patient:  [name],[name]   Account Number:  [id number]  Date Initiated:  02/07/2013  Documentation initiated by:  Arhum Peeples  Subjective/Objective Assessment:   Noted referral for assistance with medications.     Action/Plan:   Pt d/c prior to any attempts to assist with meds.   Anticipated DC Date:  02/07/2013   Anticipated DC Plan:  HOME/SELF CARE         Choice offered to / List presented to:             Status of service:  Completed, signed off Medicare Important Message given?   (If response is "NO", the following Medicare IM given date fields will be blank) Date Medicare IM given:   Date Additional Medicare IM given:    Discharge Disposition:  HOME/SELF CARE  Per UR Regulation:    If discussed at Long Length of Stay Meetings, dates discussed:    Comments:

## 2013-02-07 NOTE — Discharge Summary (Signed)
Physician Discharge Summary  [name] NWG:[id number] DOB: [birth year] DOA: 02/06/2013  PCP: No PCP Per Patient  Admit date: 02/06/2013 Discharge date: 02/07/2013  Time spent: 45 minutes  Recommendations for Outpatient Follow-up:  -Patient elected to DC home. -Did not care to wait for instructions or for medication assistance from CM.   Discharge Diagnoses:  Principal Problem:   Microcytic anemia Active Problems:   Hepatitis C   Hypokalemia   Epigastric pain   Schizophrenia   Discharge Condition: Stable  Filed Weights   02/06/13 1202 02/06/13 1929  Weight: 72.576 kg (160 lb) 75.161 kg (165 lb 11.2 oz)    History of present illness:  [name] is a 73 y.o. male  Presents with heartburn worsening for the past month or so. He has been taking frequent TUMS and reports he came to the emergency room to get a prescription for heartburn medication. he has a history of schizophrenia and refused a rectal exam. Hemoglobin was noted to be 5.4. He denies vomiting, but does have what sounds like reflux symptoms. Denies melena or hematochezia. He also reports a sensation of "choking on the inside". He is agreeable to blood transfusion and admission. He was admitted April 2014 with a similar presentation. Hemoglobin was about 5 at that time as well. GI was consulted, but patient never gave consent for endoscopy. The plan was to get him stabilized from a psychiatric standpoint and then do outpatient GI evaluation. Patient reports that he has never had endoscopy and does not want to have endoscopy during this hospitalization. He denies nonsteroidal anti-inflammatories. He reportedly has a history of hepatitis C. Anemia panel, SPEP last year fairly unremarkable. Has felt some chest pain with exertion.  Also, he reports that he called behavioral health because he has been "depressed and needs some help". He denies suicidal or homicidal ideation. He does hear voices speaking to him, spirits emanating  from within his abdomen. He would like these voices to go away. Hospitalist admission was requested.  Hospital Course:   Acute GI Bleed/Blood Loss Anemia -Presented with a Hb of 5.3. -Was transfused 2 units of PRBCs and Hb only increased to 6.7. -Before I got a chance to see him this am, nurse informed me that he was anxious about leaving the hospital and security had to be called as he attempted to leave without being discharged. -I discussed with patient in the hallway the severity of his condition and the fact that he would benefit from a repeat blood transfusion and a GI evaluation, but he refused stating that he had already planned out his day and would not stay in the hospital one more night. -Was given a prescription for a PPI, but left before CM could provide him with any medication assistance.  Schizophrenia -On admission he requested a psychiatric consultation, and a psych consult was called, however he left before being seen.   Procedures:  None   Consultations:  None  Discharge Instructions  Discharge Orders   Future Orders Complete By Expires   Discontinue IV  As directed    Increase activity slowly  As directed        Medication List         calcium carbonate 500 MG chewable tablet  Commonly known as:  TUMS - dosed in mg elemental calcium  Chew 5-10 tablets by mouth 4 (four) times daily as needed for indigestion or heartburn.     pantoprazole 40 MG tablet  Commonly known as:  PROTONIX  Take 1 tablet (40 mg total) by mouth daily.       No Known Allergies    The results of significant diagnostics from this hospitalization (including imaging, microbiology, ancillary and laboratory) are listed below for reference.    Significant Diagnostic Studies: Dg Abd Acute W/chest  02/06/2013   CLINICAL DATA:  Abdominal pain, nausea and heartburn.  EXAM: ACUTE ABDOMEN SERIES (ABDOMEN 2 VIEW & CHEST 1 VIEW)  COMPARISON:  08/30/2012.  FINDINGS: Mild central pulmonary  vascular prominence. No infiltrate, congestive heart failure or pneumothorax.  Heart size top-normal.  Tortuous aorta.  Radiopaque material within the colon may be related to recent ingestion of the barium or medication.  Gas-filled slightly prominent small bowel loops central aspect of the abdomen.  Predominantly fluid-filled stomach with gastric fold thickening suspected.  No free intraperitoneal air.  Bladder calculi.  IMPRESSION: Nonspecific bowel gas pattern as detailed above.  Bladder calculi.   Electronically Signed   By: Bridgett LarssonSteve  Olson M.D.   On: 02/06/2013 13:30    Microbiology: No results found for this or any previous visit (from the past 240 hour(s)).   Labs: Basic Metabolic Panel:  Recent Labs Lab 02/06/13 1240 02/07/13 0414  NA 139 140  K 3.4* 3.7  CL 104 108  CO2 25 22  GLUCOSE 108* 90  BUN 12 13  CREATININE 0.96 1.06  CALCIUM 9.6 8.5   Liver Function Tests:  Recent Labs Lab 02/06/13 1240  AST 15  ALT 10  ALKPHOS 69  BILITOT 0.3  PROT 7.4  ALBUMIN 3.3*    Recent Labs Lab 02/06/13 1240  LIPASE 21   No results found for this basename: AMMONIA,  in the last 168 hours CBC:  Recent Labs Lab 02/06/13 1240 02/06/13 1353 02/07/13 0414  WBC 6.4 6.1 6.3  NEUTROABS 4.8  --   --   HGB 5.4* 5.3* 6.7*  HCT 19.9* 19.8* 22.6*  MCV 68.6* 69.7* 70.6*  PLT 349 323 273   Cardiac Enzymes:  Recent Labs Lab 02/06/13 1240  TROPONINI <0.30   BNP: BNP (last 3 results) No results found for this basename: PROBNP,  in the last 8760 hours CBG: No results found for this basename: GLUCAP,  in the last 168 hours     Signed:  Chaya JanHERNANDEZ ACOSTA,ESTELA  [facility] Pager: [phone] 02/07/2013, 1:06 PM

## 2013-05-17 ENCOUNTER — Inpatient Hospital Stay
Admission: AD | Admit: 2013-05-17 | Discharge: 2013-06-06 | Disposition: A | Discharge status: Other Acute Inpatient Hospital | Payer: Self-pay | Source: Ambulatory Visit | Attending: Internal Medicine | Admitting: Internal Medicine

## 2013-05-18 ENCOUNTER — Other Ambulatory Visit (HOSPITAL_COMMUNITY): Payer: Self-pay

## 2013-05-18 LAB — URINALYSIS, ROUTINE W REFLEX MICROSCOPIC
Bilirubin Urine: NEGATIVE
Glucose, UA: NEGATIVE mg/dL
Hgb urine dipstick: NEGATIVE
Ketones, ur: NEGATIVE mg/dL
LEUKOCYTES UA: NEGATIVE
Nitrite: NEGATIVE
PROTEIN: NEGATIVE mg/dL
Specific Gravity, Urine: 1.013 (ref 1.005–1.030)
UROBILINOGEN UA: 0.2 mg/dL (ref 0.0–1.0)
pH: 7.5 (ref 5.0–8.0)

## 2013-05-18 LAB — CBC
HCT: 34.6 % — ABNORMAL LOW (ref 39.0–52.0)
Hemoglobin: 10.7 g/dL — ABNORMAL LOW (ref 13.0–17.0)
MCH: 25.4 pg — ABNORMAL LOW (ref 26.0–34.0)
MCHC: 30.9 g/dL (ref 30.0–36.0)
MCV: 82.2 fL (ref 78.0–100.0)
PLATELETS: 383 10*3/uL (ref 150–400)
RBC: 4.21 MIL/uL — ABNORMAL LOW (ref 4.22–5.81)
RDW: 22.1 % — ABNORMAL HIGH (ref 11.5–15.5)
WBC: 8.3 10*3/uL (ref 4.0–10.5)

## 2013-05-18 LAB — BASIC METABOLIC PANEL
BUN: 13 mg/dL (ref 6–23)
CALCIUM: 9.4 mg/dL (ref 8.4–10.5)
CHLORIDE: 105 meq/L (ref 96–112)
CO2: 24 mEq/L (ref 19–32)
Creatinine, Ser: 0.83 mg/dL (ref 0.50–1.35)
GFR calc non Af Amer: 86 mL/min — ABNORMAL LOW (ref >90)
Glucose, Bld: 92 mg/dL (ref 70–99)
POTASSIUM: 4 meq/L (ref 3.7–5.3)
Sodium: 140 mEq/L (ref 137–147)

## 2013-05-18 LAB — HEPATIC FUNCTION PANEL
ALT: 14 U/L (ref 0–53)
AST: 19 U/L (ref 0–37)
Albumin: 2.5 g/dL — ABNORMAL LOW (ref 3.5–5.2)
Alkaline Phosphatase: 78 U/L (ref 39–117)
BILIRUBIN TOTAL: 0.2 mg/dL — AB (ref 0.3–1.2)
Total Protein: 7.6 g/dL (ref 6.0–8.3)

## 2013-05-18 LAB — TSH: TSH: 2.42 u[IU]/mL (ref 0.350–4.500)

## 2013-05-18 LAB — SEDIMENTATION RATE: SED RATE: 25 mm/h — AB (ref 0–16)

## 2013-05-18 LAB — PROCALCITONIN: Procalcitonin: <0.1 ng/mL

## 2013-05-18 LAB — C-REACTIVE PROTEIN: CRP: 0.7 mg/dL — AB (ref <0.60)

## 2013-05-20 LAB — C-REACTIVE PROTEIN: CRP: <0.5 mg/dL — ABNORMAL LOW (ref <0.60)

## 2013-05-20 LAB — MAGNESIUM: MAGNESIUM: 1.9 mg/dL (ref 1.5–2.5)

## 2013-05-20 LAB — SEDIMENTATION RATE: Sed Rate: 35 mm/hr — ABNORMAL HIGH (ref 0–16)

## 2013-05-20 LAB — VANCOMYCIN, TROUGH: Vancomycin Tr: 15.4 ug/mL (ref 10.0–20.0)

## 2013-05-21 NOTE — Progress Notes (Signed)
[facility]                                                                                              Progress note     Patient Demographics  Adrian MoundJames Desantis, is a 73 y.o. male  RUE:[id number]:[id number]  JYN:[id number]  DOB - 10/13/1940  Admit date - 05/17/2013  Admitting Physician Elnora MorrisonAhmad B Barakat, MD  Outpatient Primary MD for the patient is No PCP Per Patient  LOS - 4   Chief complaint   Recurrent cellulitis and myositis   History of alcoholism   Paranoid schizophrenia         Subjective:   [name] today has, No headache, No chest pain, No abdominal pain -   Objective:   Vital signs  Temperature 99 Heart rate 18 Respiratory rate 17 Blood pressure 132/78 Pulse ox 95%    Exam Alert awake, confused [place].AT,PERRAL Supple Neck,No JVD, No cervical lymphadenopathy appriciated.  Symmetrical Chest wall movement, Good air movement bilaterally, RRR,No Gallops,Rubs or new Murmurs, No Parasternal Heave +ve B.Sounds, Abd Soft, Non tender, No organomegaly appriciated, No rebound - guarding or rigidity. No Cyanosis, right anterior thigh indurated and swollen   I&Os 2300/2350  Data Review   CBC  Recent Labs Lab 05/18/13 0655  WBC 8.3  HGB 10.7*  HCT 34.6*  PLT 383  MCV 82.2  MCH 25.4*  MCHC 30.9  RDW 22.1*    Chemistries   Recent Labs Lab 05/18/13 0655 05/20/13 0610  NA 140  --   K 4.0  --   CL 105  --   CO2 24  --   GLUCOSE 92  --   BUN 13  --   CREATININE 0.83  --   CALCIUM 9.4  --   MG  --  1.9  AST 19  --   ALT 14  --   ALKPHOS 78  --   BILITOT 0.2*  --    ------------------------------------------------------------------------------------------------------------------ CrCl is unknown because both a height and weight (above a minimum accepted value) are required for this  calculation. ------------------------------------------------------------------------------------------------------------------ No results found for this basename: HGBA1C,  in the last 72 hours ------------------------------------------------------------------------------------------------------------------ No results found for this basename: CHOL, HDL, LDLCALC, TRIG, CHOLHDL, LDLDIRECT,  in the last 72 hours ------------------------------------------------------------------------------------------------------------------ No results found for this basename: TSH, T4TOTAL, FREET3, T3FREE, THYROIDAB,  in the last 72 hours ------------------------------------------------------------------------------------------------------------------ No results found for this basename: VITAMINB12, FOLATE, FERRITIN, TIBC, IRON, RETICCTPCT,  in the last 72 hours  Coagulation profile No results found for this basename: INR, PROTIME,  in the last 168 hours  No results found for this basename: DDIMER,  in the last 72 hours  Cardiac Enzymes No results found for this basename: CK, CKMB, TROPONINI, MYOGLOBIN,  in the last 168 hours ------------------------------------------------------------------------------------------------------------------ No components found with this basename: POCBNP,   Micro Results No results found for this or any previous visit (from the past 240 hour(s)).     Assessment & Plan    Right thigh cellulitis/myositis/non-necrotizing fasciitis MRI continue with Unasyn and vancomycin 2 06/01/2013 Paranoid schizophrenia continue with risperidone and Klonopin SIRS;Resolved History of  anemia monitor hemoglobin History of GERD continue with PPIs or H2 blockers History of alcoholism; continue with thiamine, folic acid, multivitamins  Code Status: Full   DVT Prophylaxis  Lovenox   Carron CurieAli Zeanna Sunde M.D on 05/21/2013 at 2:39 PM

## 2013-05-22 LAB — CLOSTRIDIUM DIFFICILE BY PCR: CDIFFPCR: NEGATIVE

## 2013-05-23 ENCOUNTER — Other Ambulatory Visit (HOSPITAL_COMMUNITY): Payer: Self-pay

## 2013-05-23 LAB — CBC WITH DIFFERENTIAL/PLATELET
BASOS PCT: 0 % (ref 0–1)
Basophils Absolute: 0 10*3/uL (ref 0.0–0.1)
Eosinophils Absolute: 0.2 10*3/uL (ref 0.0–0.7)
Eosinophils Relative: 2 % (ref 0–5)
HEMATOCRIT: 38.2 % — AB (ref 39.0–52.0)
HEMOGLOBIN: 11.8 g/dL — AB (ref 13.0–17.0)
LYMPHS PCT: 19 % (ref 12–46)
Lymphs Abs: 1.4 10*3/uL (ref 0.7–4.0)
MCH: 25.9 pg — ABNORMAL LOW (ref 26.0–34.0)
MCHC: 30.9 g/dL (ref 30.0–36.0)
MCV: 83.8 fL (ref 78.0–100.0)
MONOS PCT: 7 % (ref 3–12)
Monocytes Absolute: 0.5 10*3/uL (ref 0.1–1.0)
Neutro Abs: 5.5 10*3/uL (ref 1.7–7.7)
Neutrophils Relative %: 72 % (ref 43–77)
Platelets: 383 10*3/uL (ref 150–400)
RBC: 4.56 MIL/uL (ref 4.22–5.81)
RDW: 22.4 % — AB (ref 11.5–15.5)
WBC: 7.6 10*3/uL (ref 4.0–10.5)

## 2013-05-23 LAB — BASIC METABOLIC PANEL
BUN: 13 mg/dL (ref 6–23)
CO2: 27 mEq/L (ref 19–32)
Calcium: 9.3 mg/dL (ref 8.4–10.5)
Chloride: 105 mEq/L (ref 96–112)
Creatinine, Ser: 0.97 mg/dL (ref 0.50–1.35)
GFR calc non Af Amer: 81 mL/min — ABNORMAL LOW (ref >90)
Glucose, Bld: 96 mg/dL (ref 70–99)
POTASSIUM: 4.3 meq/L (ref 3.7–5.3)
Sodium: 140 mEq/L (ref 137–147)

## 2013-05-23 LAB — C-REACTIVE PROTEIN: CRP: 2.2 mg/dL — ABNORMAL HIGH (ref <0.60)

## 2013-05-23 LAB — PHOSPHORUS: PHOSPHORUS: 3.3 mg/dL (ref 2.3–4.6)

## 2013-05-23 LAB — SEDIMENTATION RATE: SED RATE: 29 mm/h — AB (ref 0–16)

## 2013-05-23 LAB — MAGNESIUM: Magnesium: 1.9 mg/dL (ref 1.5–2.5)

## 2013-05-25 LAB — BASIC METABOLIC PANEL
BUN: 9 mg/dL (ref 6–23)
CALCIUM: 9.7 mg/dL (ref 8.4–10.5)
CO2: 27 mEq/L (ref 19–32)
Chloride: 106 mEq/L (ref 96–112)
Creatinine, Ser: 0.99 mg/dL (ref 0.50–1.35)
GFR calc Af Amer: >90 mL/min (ref >90)
GFR, EST NON AFRICAN AMERICAN: 80 mL/min — AB (ref >90)
Glucose, Bld: 87 mg/dL (ref 70–99)
Potassium: 4.3 mEq/L (ref 3.7–5.3)
SODIUM: 143 meq/L (ref 137–147)

## 2013-05-25 LAB — CBC WITH DIFFERENTIAL/PLATELET
BASOS ABS: 0.1 10*3/uL (ref 0.0–0.1)
BASOS PCT: 1 % (ref 0–1)
EOS ABS: 0.2 10*3/uL (ref 0.0–0.7)
Eosinophils Relative: 3 % (ref 0–5)
HCT: 35.9 % — ABNORMAL LOW (ref 39.0–52.0)
HEMOGLOBIN: 10.8 g/dL — AB (ref 13.0–17.0)
Lymphocytes Relative: 31 % (ref 12–46)
Lymphs Abs: 1.6 10*3/uL (ref 0.7–4.0)
MCH: 25.4 pg — ABNORMAL LOW (ref 26.0–34.0)
MCHC: 30.1 g/dL (ref 30.0–36.0)
MCV: 84.5 fL (ref 78.0–100.0)
Monocytes Absolute: 0.5 10*3/uL (ref 0.1–1.0)
Monocytes Relative: 9 % (ref 3–12)
NEUTROS PCT: 56 % (ref 43–77)
Neutro Abs: 2.6 10*3/uL (ref 1.7–7.7)
PLATELETS: 355 10*3/uL (ref 150–400)
RBC: 4.25 MIL/uL (ref 4.22–5.81)
RDW: 21.9 % — AB (ref 11.5–15.5)
WBC: 5 10*3/uL (ref 4.0–10.5)

## 2013-05-26 ENCOUNTER — Other Ambulatory Visit (HOSPITAL_COMMUNITY): Payer: Medicare Other

## 2013-05-26 LAB — VANCOMYCIN, TROUGH: Vancomycin Tr: 16.2 ug/mL (ref 10.0–20.0)

## 2013-05-28 LAB — COMPREHENSIVE METABOLIC PANEL
ALBUMIN: 2.8 g/dL — AB (ref 3.5–5.2)
ALT: 14 U/L (ref 0–53)
AST: 19 U/L (ref 0–37)
Alkaline Phosphatase: 84 U/L (ref 39–117)
BILIRUBIN TOTAL: 0.5 mg/dL (ref 0.3–1.2)
BUN: 13 mg/dL (ref 6–23)
CHLORIDE: 102 meq/L (ref 96–112)
CO2: 28 mEq/L (ref 19–32)
Calcium: 10.1 mg/dL (ref 8.4–10.5)
Creatinine, Ser: 0.91 mg/dL (ref 0.50–1.35)
GFR calc Af Amer: >90 mL/min (ref >90)
GFR, EST NON AFRICAN AMERICAN: 83 mL/min — AB (ref >90)
Glucose, Bld: 89 mg/dL (ref 70–99)
Potassium: 4.4 mEq/L (ref 3.7–5.3)
Sodium: 141 mEq/L (ref 137–147)
Total Protein: 7.9 g/dL (ref 6.0–8.3)

## 2013-05-28 LAB — CBC
HCT: 37 % — ABNORMAL LOW (ref 39.0–52.0)
Hemoglobin: 11.3 g/dL — ABNORMAL LOW (ref 13.0–17.0)
MCH: 25.8 pg — ABNORMAL LOW (ref 26.0–34.0)
MCHC: 30.5 g/dL (ref 30.0–36.0)
MCV: 84.5 fL (ref 78.0–100.0)
PLATELETS: 357 10*3/uL (ref 150–400)
RBC: 4.38 MIL/uL (ref 4.22–5.81)
RDW: 21.2 % — AB (ref 11.5–15.5)
WBC: 6.1 10*3/uL (ref 4.0–10.5)

## 2013-05-28 LAB — C-REACTIVE PROTEIN

## 2013-05-28 LAB — PREALBUMIN: Prealbumin: 17.9 mg/dL — ABNORMAL LOW (ref 17.0–34.0)

## 2013-05-28 LAB — SEDIMENTATION RATE: SED RATE: 18 mm/h — AB (ref 0–16)

## 2013-06-02 LAB — CBC
HEMATOCRIT: 39.7 % (ref 39.0–52.0)
Hemoglobin: 12 g/dL — ABNORMAL LOW (ref 13.0–17.0)
MCH: 25.9 pg — ABNORMAL LOW (ref 26.0–34.0)
MCHC: 30.2 g/dL (ref 30.0–36.0)
MCV: 85.6 fL (ref 78.0–100.0)
Platelets: 295 10*3/uL (ref 150–400)
RBC: 4.64 MIL/uL (ref 4.22–5.81)
RDW: 19.8 % — ABNORMAL HIGH (ref 11.5–15.5)
WBC: 4.1 10*3/uL (ref 4.0–10.5)

## 2013-06-02 LAB — PROTIME-INR
INR: 0.98 (ref 0.00–1.49)
Prothrombin Time: 12.8 seconds (ref 11.6–15.2)

## 2013-06-02 LAB — BASIC METABOLIC PANEL
BUN: 12 mg/dL (ref 6–23)
CO2: 28 mEq/L (ref 19–32)
Calcium: 9.8 mg/dL (ref 8.4–10.5)
Chloride: 101 mEq/L (ref 96–112)
Creatinine, Ser: 0.91 mg/dL (ref 0.50–1.35)
GFR calc Af Amer: >90 mL/min (ref >90)
GFR, EST NON AFRICAN AMERICAN: 83 mL/min — AB (ref >90)
GLUCOSE: 89 mg/dL (ref 70–99)
Potassium: 4.3 mEq/L (ref 3.7–5.3)
Sodium: 140 mEq/L (ref 137–147)

## 2013-06-05 LAB — BASIC METABOLIC PANEL
BUN: 18 mg/dL (ref 6–23)
CHLORIDE: 101 meq/L (ref 96–112)
CO2: 25 mEq/L (ref 19–32)
Calcium: 9.7 mg/dL (ref 8.4–10.5)
Creatinine, Ser: 0.97 mg/dL (ref 0.50–1.35)
GFR calc non Af Amer: 81 mL/min — ABNORMAL LOW (ref >90)
Glucose, Bld: 88 mg/dL (ref 70–99)
Potassium: 4 mEq/L (ref 3.7–5.3)
Sodium: 138 mEq/L (ref 137–147)

## 2013-06-05 LAB — CBC
HEMATOCRIT: 39.2 % (ref 39.0–52.0)
Hemoglobin: 12.1 g/dL — ABNORMAL LOW (ref 13.0–17.0)
MCH: 26.3 pg (ref 26.0–34.0)
MCHC: 30.9 g/dL (ref 30.0–36.0)
MCV: 85.2 fL (ref 78.0–100.0)
Platelets: 303 10*3/uL (ref 150–400)
RBC: 4.6 MIL/uL (ref 4.22–5.81)
RDW: 19.6 % — ABNORMAL HIGH (ref 11.5–15.5)
WBC: 4.7 10*3/uL (ref 4.0–10.5)

## 2013-06-06 ENCOUNTER — Encounter (HOSPITAL_COMMUNITY): Payer: Self-pay | Admitting: Emergency Medicine

## 2013-06-06 ENCOUNTER — Emergency Department (HOSPITAL_COMMUNITY)
Admission: EM | Admit: 2013-06-06 | Discharge: 2013-06-07 | Disposition: A | Discharge status: Home / Self Care | Payer: Medicare Other | Attending: Emergency Medicine | Admitting: Emergency Medicine

## 2013-06-06 DIAGNOSIS — F250 Schizoaffective disorder, bipolar type: Secondary | ICD-10-CM | POA: Diagnosis present

## 2013-06-06 DIAGNOSIS — Z87440 Personal history of urinary (tract) infections: Secondary | ICD-10-CM | POA: Insufficient documentation

## 2013-06-06 DIAGNOSIS — F259 Schizoaffective disorder, unspecified: Secondary | ICD-10-CM

## 2013-06-06 DIAGNOSIS — IMO0002 Reserved for concepts with insufficient information to code with codable children: Secondary | ICD-10-CM | POA: Insufficient documentation

## 2013-06-06 DIAGNOSIS — Z8619 Personal history of other infectious and parasitic diseases: Secondary | ICD-10-CM | POA: Insufficient documentation

## 2013-06-06 DIAGNOSIS — Z87891 Personal history of nicotine dependence: Secondary | ICD-10-CM | POA: Insufficient documentation

## 2013-06-06 DIAGNOSIS — F200 Paranoid schizophrenia: Secondary | ICD-10-CM | POA: Insufficient documentation

## 2013-06-06 DIAGNOSIS — F319 Bipolar disorder, unspecified: Secondary | ICD-10-CM | POA: Insufficient documentation

## 2013-06-06 DIAGNOSIS — R000 Tachycardia, unspecified: Secondary | ICD-10-CM | POA: Insufficient documentation

## 2013-06-06 DIAGNOSIS — Z87448 Personal history of other diseases of urinary system: Secondary | ICD-10-CM | POA: Insufficient documentation

## 2013-06-06 DIAGNOSIS — Z79899 Other long term (current) drug therapy: Secondary | ICD-10-CM | POA: Insufficient documentation

## 2013-06-06 LAB — CBC
HEMATOCRIT: 39.2 % (ref 39.0–52.0)
Hemoglobin: 12 g/dL — ABNORMAL LOW (ref 13.0–17.0)
MCH: 25.5 pg — ABNORMAL LOW (ref 26.0–34.0)
MCHC: 30.6 g/dL (ref 30.0–36.0)
MCV: 83.2 fL (ref 78.0–100.0)
Platelets: 287 10*3/uL (ref 150–400)
RBC: 4.71 MIL/uL (ref 4.22–5.81)
RDW: 19 % — ABNORMAL HIGH (ref 11.5–15.5)
WBC: 5.4 10*3/uL (ref 4.0–10.5)

## 2013-06-06 LAB — COMPREHENSIVE METABOLIC PANEL
ALK PHOS: 87 U/L (ref 39–117)
ALT: 19 U/L (ref 0–53)
AST: 28 U/L (ref 0–37)
Albumin: 3.3 g/dL — ABNORMAL LOW (ref 3.5–5.2)
BILIRUBIN TOTAL: 0.3 mg/dL (ref 0.3–1.2)
BUN: 19 mg/dL (ref 6–23)
CHLORIDE: 101 meq/L (ref 96–112)
CO2: 25 mEq/L (ref 19–32)
Calcium: 9.9 mg/dL (ref 8.4–10.5)
Creatinine, Ser: 0.95 mg/dL (ref 0.50–1.35)
GFR calc Af Amer: >90 mL/min (ref >90)
GFR calc non Af Amer: 82 mL/min — ABNORMAL LOW (ref >90)
Glucose, Bld: 148 mg/dL — ABNORMAL HIGH (ref 70–99)
POTASSIUM: 4.3 meq/L (ref 3.7–5.3)
SODIUM: 140 meq/L (ref 137–147)
TOTAL PROTEIN: 8.8 g/dL — AB (ref 6.0–8.3)

## 2013-06-06 LAB — RAPID URINE DRUG SCREEN, HOSP PERFORMED
AMPHETAMINES: NOT DETECTED
BARBITURATES: NOT DETECTED
BENZODIAZEPINES: NOT DETECTED
Cocaine: NOT DETECTED
Opiates: NOT DETECTED
Tetrahydrocannabinol: NOT DETECTED

## 2013-06-06 LAB — ETHANOL: Alcohol, Ethyl (B): <11 mg/dL (ref 0–11)

## 2013-06-06 MED ORDER — ACETAMINOPHEN 325 MG PO TABS
650.0000 mg | ORAL_TABLET | ORAL | Status: DC | PRN
  Administered 2013-06-06: 650 mg via ORAL
  Filled 2013-06-06: qty 2

## 2013-06-06 MED ORDER — CALCIUM CARBONATE ANTACID 500 MG PO CHEW
500.0000 mg | CHEWABLE_TABLET | Freq: Four times a day (QID) | ORAL | Status: DC | PRN
  Filled 2013-06-06: qty 2

## 2013-06-06 MED ORDER — PANTOPRAZOLE SODIUM 40 MG PO TBEC
40.0000 mg | DELAYED_RELEASE_TABLET | Freq: Every day | ORAL | Status: DC
  Administered 2013-06-06: 40 mg via ORAL
  Filled 2013-06-06: qty 1

## 2013-06-06 NOTE — Progress Notes (Signed)
  CARE MANAGEMENT ED NOTE 06/06/2013  Patient:  [name],[name]   Account Number:  [id number]  Date Initiated:  06/06/2013  Documentation initiated by:  Radford PaxFERRERO,Yamilex Borgwardt  Subjective/Objective Assessment:   Patient presents to Ed with increased agitation, manic behavior     Subjective/Objective Assessment Detail:   Patient with pmhx of schizophrenia, Hep C.  [facility].     Action/Plan:   Action/Plan Detail:   Anticipated DC Date:       Status Recommendation to Physician:   Result of Recommendation:    Other ED Services  Consult Working Plan    DC Planning Services  Other  PCP issues    Choice offered to / List presented to:            Status of service:  Completed, signed off  ED Comments:   ED Comments Detail:  EDCM went to speak to patient at bedside regarding pcp issues, however, EDCM called patient'[name] nmae three times, patient sound asleep snoring.  EDCM did not disturb this patient.

## 2013-06-06 NOTE — Progress Notes (Signed)
Per Dr. Ethelda ChickJacubowitz request CSW made contact with the person on the IVC paperwork.  CSW spoke with the patient's brother and he reports not seeing the patient for the past 2 months. He reports that they do not stay in contact but he did say the patient last [address] was at [address]  The patient's brother report he can not come to his home.   [facility] and [facility] have [facility].  Maryelizabeth Rowanressa Louisa Favaro, MSW, [place], 06/06/2013 Evening Clinical Social Worker [phone]

## 2013-06-06 NOTE — Consult Note (Signed)
[place] Psychiatry Consult   Reason for Consult:  Schizoaffective disorder Referring Physician:  EDP  [name] is an 73 y.o. male. Total Time spent with patient: 30 minutes  Assessment: AXIS I:  Schizoaffective Disorder AXIS II:  Deferred AXIS III:   Past Medical History  Diagnosis Date  . Schizophrenia   . Hepatitis C   . Benign hypertrophy of prostate   . Urinary tract infection    AXIS IV:  other psychosocial or environmental problems, problems related to social environment and problems with primary support group AXIS V:  51-60 moderate symptoms  Plan:  Supportive therapy provided about ongoing stressors. RE-evaluate in am, trying to contact his sister who he lives with, [name] at [phone].  Subjective:   [name] is a 73 y.o. male patient will be re-evaluated in the am.  HPI:  The patient was in the hospital for cellulitis in his knee and was transferred to [facility].  He was sent from [facility] with no notification or report earlier and sent back.  Now, he is in the ED with no suicidal/homicidal ideations or hallucinations.  History of schizoaffective disorder and past alcohol dependency.  He states he lives with his sister [name] but we were not able to get in touch with her.  [name] appears stable at this time but evidently was given Seroquel prior to coming to the ED.  He claims not to be taking any psychiatric medications.  Denies alcohol and drug use.  Unsure of his baseline at this point since he was medicated prior to arriving and appears calm and appropriate at this time.  Re-evaluate in the am and contacting his sister, consent given by the patient. HPI Elements:   N/A  Past Psychiatric History: Past Medical History  Diagnosis Date  . Schizophrenia   . Hepatitis C   . Benign hypertrophy of prostate   . Urinary tract infection     reports that he quit smoking about 38 years ago. He does not have any smokeless tobacco history on file. He  reports that he does not drink alcohol or use illicit drugs. No family history on file.         Allergies:  No Known Allergies  ACT Assessment Complete:  Yes:    Educational Status    Risk to Self:    Risk to Others:    Abuse:    Prior Inpatient Therapy:    Prior Outpatient Therapy:    Additional Information:                    Objective: Blood pressure 117/78, pulse 112, temperature 98.4 F (36.9 C), resp. rate 18, SpO2 99.00%.There is no weight on file to calculate BMI. Results for orders placed during the hospital encounter of 06/06/13 (from the past 72 hour(s))  CBC     Status: Abnormal   Collection Time    06/06/13  6:48 PM      Result Value Ref Range   WBC 5.4  4.0 - 10.5 K/uL   RBC 4.71  4.22 - 5.81 MIL/uL   Hemoglobin 12.0 (*) 13.0 - 17.0 g/dL   HCT 39.2  39.0 - 52.0 %   MCV 83.2  78.0 - 100.0 fL   MCH 25.5 (*) 26.0 - 34.0 pg   MCHC 30.6  30.0 - 36.0 g/dL   RDW 19.0 (*) 11.5 - 15.5 %   Platelets 287  150 - 400 K/uL   Comment: SPECIMEN  CHECKED FOR CLOTS     REPEATED TO VERIFY     PLATELET COUNT CONFIRMED BY SMEAR     LARGE PLATELETS PRESENT  COMPREHENSIVE METABOLIC PANEL     Status: Abnormal   Collection Time    06/06/13  6:48 PM      Result Value Ref Range   Sodium 140  137 - 147 mEq/L   Potassium 4.3  3.7 - 5.3 mEq/L   Chloride 101  96 - 112 mEq/L   CO2 25  19 - 32 mEq/L   Glucose, Bld 148 (*) 70 - 99 mg/dL   BUN 19  6 - 23 mg/dL   Creatinine, Ser 0.95  0.50 - 1.35 mg/dL   Calcium 9.9  8.4 - 10.5 mg/dL   Total Protein 8.8 (*) 6.0 - 8.3 g/dL   Albumin 3.3 (*) 3.5 - 5.2 g/dL   AST 28  0 - 37 U/L   ALT 19  0 - 53 U/L   Alkaline Phosphatase 87  39 - 117 U/L   Total Bilirubin 0.3  0.3 - 1.2 mg/dL   GFR calc non Af Amer 82 (*) >90 mL/min   GFR calc Af Amer >90  >90 mL/min   Comment: (NOTE)     The eGFR has been calculated using the CKD EPI equation.     This calculation has not been validated in all clinical situations.     eGFR's  persistently <90 mL/min signify possible Chronic Kidney     Disease.  [name]     Status: None   Collection Time    06/06/13  6:48 PM      Result Value Ref Range   Alcohol, Ethyl (B) <11  0 - 11 mg/dL   Comment:            LOWEST DETECTABLE LIMIT FOR     SERUM ALCOHOL IS 11 mg/dL     FOR MEDICAL PURPOSES ONLY  URINE RAPID DRUG SCREEN (HOSP PERFORMED)     Status: None   Collection Time    06/06/13  7:39 PM      Result Value Ref Range   Opiates NONE DETECTED  NONE DETECTED   Cocaine NONE DETECTED  NONE DETECTED   Benzodiazepines NONE DETECTED  NONE DETECTED   Amphetamines NONE DETECTED  NONE DETECTED   Tetrahydrocannabinol NONE DETECTED  NONE DETECTED   Barbiturates NONE DETECTED  NONE DETECTED   Comment:            DRUG SCREEN FOR MEDICAL PURPOSES     ONLY.  IF CONFIRMATION IS NEEDED     FOR ANY PURPOSE, NOTIFY LAB     WITHIN 5 DAYS.                LOWEST DETECTABLE LIMITS     FOR URINE DRUG SCREEN     Drug Class       Cutoff (ng/mL)     Amphetamine      1000     Barbiturate      200     Benzodiazepine   741     Tricyclics       638     Opiates          300     Cocaine          300     THC              50   Labs are reviewed and are pertinent for medical issues  being addressed.  Current Facility-Administered Medications  Medication Dose Route Frequency Provider Last Rate Last Dose  . acetaminophen (TYLENOL) tablet 650 mg  650 mg Oral Q4H PRN Orlie Dakin, MD   650 mg at 06/06/13 2028  . calcium carbonate (TUMS - dosed in mg elemental calcium) chewable tablet 500-1,000 mg  500-1,000 mg Oral QID PRN Orlie Dakin, MD      . pantoprazole (PROTONIX) EC tablet 40 mg  40 mg Oral Daily Orlie Dakin, MD       Current Outpatient Prescriptions  Medication Sig Dispense Refill  . calcium carbonate (TUMS - DOSED IN MG ELEMENTAL CALCIUM) 500 MG chewable tablet Chew 5-10 tablets by mouth 4 (four) times daily as needed for indigestion or heartburn.      . pantoprazole (PROTONIX)  40 MG tablet Take 1 tablet (40 mg total) by mouth daily.  30 tablet  1    Psychiatric Specialty Exam:     Blood pressure 117/78, pulse 112, temperature 98.4 F (36.9 C), resp. rate 18, SpO2 99.00%.There is no weight on file to calculate BMI.  General Appearance: Casual  Eye Contact::  Good  Speech:  Normal Rate  Volume:  Normal  Mood:  Euthymic  Affect:  Blunt  Thought Process:  Coherent  Orientation:  Full (Time, Place, and Person)  Thought Content:  WDL  Suicidal Thoughts:  No  Homicidal Thoughts:  No  Memory:  Immediate;   Fair Recent;   Fair Remote;   Fair  Judgement:  Fair  Insight:  Lacking  Psychomotor Activity:  Decreased  Concentration:  Fair  Recall:  [employer] [place]: Fair  Akathisia:  No  Handed:  Right  AIMS (if indicated):     Assets:  Leisure Time Resilience  Sleep:      Musculoskeletal: Strength & Muscle Tone: within normal limits Gait & Station: normal Patient leans: N/A  Treatment Plan Summary: Daily contact with patient to assess and evaluate symptoms and progress in treatment Medication management; re-evaluate in the am.  Waylan Boga, PMH-NP 06/06/2013 8:51 PM

## 2013-06-06 NOTE — ED Notes (Signed)
Patient denies SI, HI, AVH at present. States he has paranoia related to "previous bad experience with law enforcement of [place]", but not feeling anxious or depressed. Patient pleasant, cooperative.  Safety maintained, Q 15 checks in place.

## 2013-06-06 NOTE — ED Provider Notes (Signed)
CSN: [id number]     Arrival date & time 06/06/13  1826 History   None   History is obtained by telephone from Dr.Hijazi Myriam Forehand, case manager at [facility] Chief Complaint  Patient presents with  . Medical Clearance   history of present illness  Patient under involuntary psychiatric commitment.  (Consider location/radiation/quality/duration/timing/severity/associated sxs/prior Treatment) HPI [facility]. He was receiving intravenous antibiotics for cellulitis of right thigh. Treatment course has been completed. He was stable medically for discharge. It was noted by Dr.Hijzi today that patient was agitated, somewhat combative. He was medicated with Haldol 4 mg IM, Geodon 10 mg IM and Ativan 1 mg IV at 2 PM today. He was then transported to behavioral health hospital for evaluation. Subsequently returned to [facility] where he was involuntarily committed for psychiatric treatment and brought here accompanied by law-enforcement. Presently patient admits to feeling paranoid. He denies homicidal or suicidal ideation. No other complaint. Past Medical History  Diagnosis Date  . Schizophrenia   . Hepatitis C   . Benign hypertrophy of prostate   . Urinary tract infection    Past Surgical History  Procedure Laterality Date  . Knee surgery      right   No family history on file. History  Substance Use Topics  . Smoking status: Former Smoker -- 1.00 packs/day for 20 years    Quit date: 06/11/1975  . Smokeless tobacco: Not on file  . Alcohol Use: No    Review of Systems  Constitutional: Negative.   HENT: Negative.   Respiratory: Negative.   Cardiovascular: Negative.   Gastrointestinal: Negative.   Musculoskeletal: Negative.   Skin: Negative.   Neurological: Negative.   Psychiatric/Behavioral: Positive for behavioral problems and agitation.       Feels paranoid  All other systems reviewed and are  negative.     Allergies  Review of patient's allergies indicates no known allergies.  Home Medications   Prior to Admission medications   Medication Sig Start Date End Date Taking? Authorizing Provider  calcium carbonate (TUMS - DOSED IN MG ELEMENTAL CALCIUM) 500 MG chewable tablet Chew 5-10 tablets by mouth 4 (four) times daily as needed for indigestion or heartburn.    Historical Provider, MD  pantoprazole (PROTONIX) 40 MG tablet Take 1 tablet (40 mg total) by mouth daily. 02/07/13   Henderson Cloud, MD   BP 119/70  Pulse 121  Temp(Src) 98.4 F (36.9 C)  Resp 20  SpO2 95% Physical Exam  Nursing note and vitals reviewed. Constitutional: He appears well-developed and well-nourished.  HENT:  Head: Normocephalic and atraumatic.  Eyes: Conjunctivae are normal. Pupils are equal, round, and reactive to light.  Neck: Neck supple. No tracheal deviation present. No thyromegaly present.  Cardiovascular: Regular rhythm.   No murmur heard. Mildly tachycardic  Pulmonary/Chest: Effort normal and breath sounds normal.  Abdominal: Soft. Bowel sounds are normal. He exhibits no distension. There is no tenderness.  Musculoskeletal: Normal range of motion. He exhibits no edema and no tenderness.  Neurological: He is alert. Coordination normal.  Skin: Skin is warm and dry. No rash noted.  Psychiatric: He has a normal mood and affect.    ED Course  Procedures (including critical care time) Labs Review Labs Reviewed  CBC - Abnormal; Notable for the following:    Hemoglobin 12.0 (*)    MCH 25.5 (*)    RDW 19.0 (*)    All other components within normal limits  COMPREHENSIVE METABOLIC PANEL  ETHANOL  URINE RAPID DRUG SCREEN (HOSP PERFORMED)    Imaging Review No results found.   EKG Interpretation None     Results for orders placed during the hospital encounter of 06/06/13  CBC      Result Value Ref Range   WBC 5.4  4.0 - 10.5 K/uL   RBC 4.71  4.22 - 5.81 MIL/uL    Hemoglobin 12.0 (*) 13.0 - 17.0 g/dL   HCT 16.139.2  09.639.0 - 04.552.0 %   MCV 83.2  78.0 - 100.0 fL   MCH 25.5 (*) 26.0 - 34.0 pg   MCHC 30.6  30.0 - 36.0 g/dL   RDW 40.919.0 (*) 81.111.5 - 91.415.5 %   Platelets 287  150 - 400 K/uL  COMPREHENSIVE METABOLIC PANEL      Result Value Ref Range   Sodium 140  137 - 147 mEq/L   Potassium 4.3  3.7 - 5.3 mEq/L   Chloride 101  96 - 112 mEq/L   CO2 25  19 - 32 mEq/L   Glucose, Bld 148 (*) 70 - 99 mg/dL   BUN 19  6 - 23 mg/dL   Creatinine, Ser 7.820.95  0.50 - 1.35 mg/dL   Calcium 9.9  8.4 - 95.610.5 mg/dL   Total Protein 8.8 (*) 6.0 - 8.3 g/dL   Albumin 3.3 (*) 3.5 - 5.2 g/dL   AST 28  0 - 37 U/L   ALT 19  0 - 53 U/L   Alkaline Phosphatase 87  39 - 117 U/L   Total Bilirubin 0.3  0.3 - 1.2 mg/dL   GFR calc non Af Amer 82 (*) >90 mL/min   GFR calc Af Amer >90  >90 mL/min  ETHANOL      Result Value Ref Range   Alcohol, Ethyl (B) <11  0 - 11 mg/dL  URINE RAPID DRUG SCREEN (HOSP PERFORMED)      Result Value Ref Range   Opiates NONE DETECTED  NONE DETECTED   Cocaine NONE DETECTED  NONE DETECTED   Benzodiazepines NONE DETECTED  NONE DETECTED   Amphetamines NONE DETECTED  NONE DETECTED   Tetrahydrocannabinol NONE DETECTED  NONE DETECTED   Barbiturates NONE DETECTED  NONE DETECTED   Dg Chest Port 1 View  05/23/2013   CLINICAL DATA:  PICC line placement  EXAM: PORTABLE CHEST - 1 VIEW  COMPARISON:  Portable exam 1513 hr compared to 05/18/2013  FINDINGS: RIGHT arm PICC line tip projects over cavoatrial junction.  Minimal enlargement of cardiac silhouette.  Tortuous thoracic aorta.  Lungs clear.  No pleural effusion or pneumothorax.  Bones unremarkable.  IMPRESSION: Tip of RIGHT arm PICC line projects over cavoatrial junction region.  Minimal enlargement of cardiac silhouette.  No acute abnormalities.   Electronically Signed   By: Ulyses SouthwardMark  Boles M.D.   On: 05/23/2013 15:37   Dg Chest Port 1 View  05/18/2013   CLINICAL DATA:  Cellulitis  EXAM: PORTABLE CHEST - 1 VIEW  COMPARISON:   02/06/2013  FINDINGS: The cardiac shadow is within normal limits. The lungs are clear bilaterally. No focal bony abnormality is seen.  IMPRESSION: No active disease.   Electronically Signed   By: Alcide CleverMark  Lukens M.D.   On: 05/18/2013 07:40   Dg Knee Right Port  05/26/2013   CLINICAL DATA:  Right knee pain  EXAM: PORTABLE RIGHT KNEE - 1-2 VIEW  COMPARISON:  None.  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  No fracture or dislocation is seen.  Small suprapatellar knee joint effusion.  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  Small suprapatellar knee joint effusion.   Electronically Signed   By: Charline BillsSriyesh  Krishnan M.D.   On: 05/26/2013 17:13    MDM   Final diagnoses:  None    Patient to be evaluated by TTS and psychiatric evaluation social worker also consulted for placement Diagnosis#1 paranoid schizophrenia #2 hyperglycemia   Doug SouSam Eduardo Wurth, MD 06/06/13 2019

## 2013-06-06 NOTE — ED Notes (Signed)
Pt is IVC.  Papers state: "Respondent is a 73 year old AA male that [facility].  He has a prior diagnosis of Schizophrenia.  Upon arrival, staff  Noticed that patient is both manic and not making any sense.  Patient's insight and judgment are both poor.  Hist thought process is circumstantial, he is not goal directed.  He is anxious to leave the hospital.  MD requesting that patient seks further psychiatric treatment in order to prevent further disability or deterioration that would predictably result in dangerousness."  [employer] states that patient has been both calm and cooperative with them.

## 2013-06-07 ENCOUNTER — Encounter (HOSPITAL_COMMUNITY): Payer: Self-pay | Admitting: Registered Nurse

## 2013-06-07 NOTE — Consult Note (Signed)
Face to face evaluation and I agree with this note 

## 2013-06-07 NOTE — Consult Note (Signed)
[facility] Follow Up Psychiatry Consult   Reason for Consult:  Schizoaffective disorder Referring Physician:  EDP  [name] is an 73 y.o. male. Total Time spent with patient: 30 minutes  Assessment: AXIS I:  Schizoaffective Disorder AXIS II:  Deferred AXIS III:   Past Medical History  Diagnosis Date  . Schizophrenia   . Hepatitis C   . Benign hypertrophy of prostate   . Urinary tract infection    AXIS IV:  other psychosocial or environmental problems, problems related to social environment and problems with primary support group AXIS V:  51-60 moderate symptoms  Plan:  No evidence of imminent risk to self or others at present.   Patient does not meet criteria for psychiatric inpatient admission. Supportive therapy provided about ongoing stressors. Discussed crisis plan, support from social network, calling 911, coming to the Emergency Department, and calling Suicide Hotline.   Subjective:    [name] is a 73 y.o. male patient will be re-evaluated in the am.  HPI:  Patient states that he feeling good and needs to go so "I was suppose th have operation on bladder; I had been fasting to clean out my system then my leg got swollen up on me and my Testicle (assume patient is referring to his scrotum when he says testicle because that is where he is pointing). When they drained the fluid off of my led my testicle went down and I am feeling much better. I need to go so that I can go talk to doctor abut my surgery."  Patient states that he does have a history of psych hospital admission related to paranoia one year ago.  Patient states that he is not paranoid at this time "I have just been afraid to talk to the doctor about operation; but I am ready now." Patient states that he lives with his sister.  CW and SW have called multiple time and left voice message and has not received a return phone call.   HPI Elements:   N/A  Review of Systems  Gastrointestinal: Negative for nausea,  vomiting, abdominal pain and diarrhea.  Genitourinary:       Patient states that he has some swelling in "Testicles" (Scrotum)  Musculoskeletal: Positive for joint pain.       Swelling lower extremities bilaterally  Neurological: Negative for seizures and headaches.  Psychiatric/Behavioral: Negative for depression, suicidal ideas, hallucinations and substance abuse. The patient is not nervous/anxious.     Denies family history of paranoia Past Psychiatric History: Past Medical History  Diagnosis Date  . Schizophrenia   . Hepatitis C   . Benign hypertrophy of prostate   . Urinary tract infection     reports that he quit smoking about 38 years ago. He does not have any smokeless tobacco history on file. He reports that he does not drink alcohol or use illicit drugs. History reviewed. No pertinent family history.         Allergies:  No Known Allergies  ACT Assessment Complete:  Yes:    Educational Status    Risk to Self:    Risk to Others:    Abuse:    Prior Inpatient Therapy:    Prior Outpatient Therapy:    Additional Information:     Objective: Blood pressure 142/85, pulse 84, temperature 97.8 F (36.6 C), temperature source Oral, resp. rate 19, SpO2 99.00%.There is no weight on file to calculate BMI. Results for orders placed during the hospital encounter of 06/06/13 (from  the past 72 hour(s))  CBC     Status: Abnormal   Collection Time    06/06/13  6:48 PM      Result Value Ref Range   WBC 5.4  4.0 - 10.5 K/uL   RBC 4.71  4.22 - 5.81 MIL/uL   Hemoglobin 12.0 (*) 13.0 - 17.0 g/dL   HCT 39.2  39.0 - 52.0 %   MCV 83.2  78.0 - 100.0 fL   MCH 25.5 (*) 26.0 - 34.0 pg   MCHC 30.6  30.0 - 36.0 g/dL   RDW 19.0 (*) 11.5 - 15.5 %   Platelets 287  150 - 400 K/uL   Comment: SPECIMEN CHECKED FOR CLOTS     REPEATED TO VERIFY     PLATELET COUNT CONFIRMED BY SMEAR     LARGE PLATELETS PRESENT  COMPREHENSIVE METABOLIC PANEL     Status: Abnormal   Collection Time    06/06/13   6:48 PM      Result Value Ref Range   Sodium 140  137 - 147 mEq/L   Potassium 4.3  3.7 - 5.3 mEq/L   Chloride 101  96 - 112 mEq/L   CO2 25  19 - 32 mEq/L   Glucose, Bld 148 (*) 70 - 99 mg/dL   BUN 19  6 - 23 mg/dL   Creatinine, Ser 0.95  0.50 - 1.35 mg/dL   Calcium 9.9  8.4 - 10.5 mg/dL   Total Protein 8.8 (*) 6.0 - 8.3 g/dL   Albumin 3.3 (*) 3.5 - 5.2 g/dL   AST 28  0 - 37 U/L   ALT 19  0 - 53 U/L   Alkaline Phosphatase 87  39 - 117 U/L   Total Bilirubin 0.3  0.3 - 1.2 mg/dL   GFR calc non Af Amer 82 (*) >90 mL/min   GFR calc Af Amer >90  >90 mL/min   Comment: (NOTE)     The eGFR has been calculated using the CKD EPI equation.     This calculation has not been validated in all clinical situations.     eGFR's persistently <90 mL/min signify possible Chronic Kidney     Disease.  ETHANOL     Status: None   Collection Time    06/06/13  6:48 PM      Result Value Ref Range   Alcohol, Ethyl (B) <11  0 - 11 mg/dL   Comment:            LOWEST DETECTABLE LIMIT FOR     SERUM ALCOHOL IS 11 mg/dL     FOR MEDICAL PURPOSES ONLY  URINE RAPID DRUG SCREEN (HOSP PERFORMED)     Status: None   Collection Time    06/06/13  7:39 PM      Result Value Ref Range   Opiates NONE DETECTED  NONE DETECTED   Cocaine NONE DETECTED  NONE DETECTED   Benzodiazepines NONE DETECTED  NONE DETECTED   Amphetamines NONE DETECTED  NONE DETECTED   Tetrahydrocannabinol NONE DETECTED  NONE DETECTED   Barbiturates NONE DETECTED  NONE DETECTED   Comment:            DRUG SCREEN FOR MEDICAL PURPOSES     ONLY.  IF CONFIRMATION IS NEEDED     FOR ANY PURPOSE, NOTIFY LAB     WITHIN 5 DAYS.                LOWEST DETECTABLE LIMITS     FOR URINE DRUG  SCREEN     Drug Class       Cutoff (ng/mL)     Amphetamine      1000     Barbiturate      200     Benzodiazepine   710     Tricyclics       626     Opiates          300     Cocaine          300     THC              50   Labs are reviewed and are pertinent for medical  issues being addressed.  Current Facility-Administered Medications  Medication Dose Route Frequency Provider Last Rate Last Dose  . acetaminophen (TYLENOL) tablet 650 mg  650 mg Oral Q4H PRN Orlie Dakin, MD   650 mg at 06/06/13 2028   No current outpatient prescriptions on file.    Psychiatric Specialty Exam:     Blood pressure 142/85, pulse 84, temperature 97.8 F (36.6 C), temperature source Oral, resp. rate 19, SpO2 99.00%.There is no weight on file to calculate BMI.  General Appearance: Casual  Eye Contact::  Good  Speech:  Normal Rate  Volume:  Normal  Mood:  Euthymic  Affect:  Blunt  Thought Process:  Coherent  Orientation:  Full (Time, Place, and Person)  Thought Content:  WDL  Suicidal Thoughts:  No  Homicidal Thoughts:  No  Memory:  Immediate;   Fair Recent;   Fair Remote;   Fair  Judgement:  Fair  Insight:  Lacking  Psychomotor Activity:  Decreased  Concentration:  Fair  Recall:  [place]: Fair  Akathisia:  No  Handed:  Right  AIMS (if indicated):     Assets:  Leisure Time Resilience  Sleep:      Musculoskeletal: Strength & Muscle Tone: within normal limits Gait & Station: normal Patient leans: N/A  Recommendation: Patient will be referred to out patient psychiatric services. Patient does not meet criteria for involuntary acute in patient psychiatric services as he has no dangerous to himself or others.Patient has chronic problem and also not beneficial due chronic non compliance with treatments.    Treatment Plan Summary: Discharge home with resource information for outpatient services.  Patient are to follow up with primary physicians for medical condictions  Discharge Assessment     Demographic Factors:  Male and Age 1 or older  Total Time spent with patient: 15 minutes  Psychiatric Specialty Exam: Same as above  Musculoskeletal: Same as above  Mental Status Per Nursing Assessment::   On Admission:      Current Mental Status by Physician: Patient denies suicidal/homicidal ideation, psychosis, and paranoia  Loss Factors: NA  Historical Factors: NA  Risk Reduction Factors:   Living with another person, especially a relative  Continued Clinical Symptoms:  Previous Psychiatric Diagnoses and Treatments  Cognitive Features That Contribute To Risk:  None noted  Suicide Risk:  Minimal: No identifiable suicidal ideation.  Patients presenting with no risk factors but with morbid ruminations; may be classified as minimal risk based on the severity of the depressive symptoms  Discharge Diagnoses: Same as above  Plan Of Care/Follow-up recommendations:  Activity:  Resume usual activity Diet:  Resume usual diet  Is patient on multiple antipsychotic therapies at discharge:  No   Has Patient had three or more failed trials of antipsychotic monotherapy by history:  No  Recommended Plan for Multiple Antipsychotic Therapies: NA   Shuvon Rankin, FNP-BC  06/07/2013 12:28 PM

## 2013-06-07 NOTE — Consult Note (Signed)
Case discussed, agree with plan 

## 2013-06-07 NOTE — BH Assessment (Signed)
Attempted to contact Pt's sister, Clent DemarkMary McQuire, at [phone] three times over the past two hours.  Harlin RainFord Ellis Ria CommentWarrick Jr, LPC, [facility] Triage Specialist [phone]

## 2013-06-07 NOTE — Discharge Instructions (Signed)
Schizoaffective Disorder  Schizoaffective disorder (ScAD) is a mental illness. It causes symptoms that are a mixture of schizophrenia (a psychotic disorder) and an affective (mood) disorder. The schizophrenic symptoms may include delusions, hallucinations, or odd behavior. The mood symptoms may be similar to major depression or bipolar disorder. ScAD may interfere with personal relationships or normal daily activities. People with ScAD are at increased risk for job loss, social isolation, physical health problems, anxiety and substance use disorders, and suicide.  ScAD usually occurs in cycles. Periods of severe symptoms are followed by periods of  less severe symptoms or improvement. The illness affects men and women equally but usually appears at an earlier age (teenage or early adult years) in men. People who have family members with schizophrenia, bipolar disorder, or ScAD are at higher risk of developing ScAD.  SYMPTOMS   At any one time, people with ScAD may have psychotic symptoms only or both psychotic and mood symptoms. The psychotic symptoms include one or more of the following:  · Hearing, seeing, or feeling things that are not there (hallucinations).    · Having fixed, false beliefs (delusions). The delusions usually are of being attacked, harassed, cheated, persecuted, or conspired against (paranoid delusions).  · Speaking in a way that makes no sense to others (disorganized speech).  The psychotic symptoms of ScAD may also include confusing or odd behavior or any of the negative symptoms of schizophrenia. These include loss of motivation for normal daily activities, such as bathing or grooming, withdrawal from other people, and lack of emotions.     The mood symptoms of ScAD occur more often than not. They resemble major depressive disorder or bipolar mania. Symptoms of major depression include depressed mood and four or more of the following:  · Loss of interest in usually pleasurable activities  (anhedonia).  · Sleeping more or less than normal.  · Feeling worthless or excessively guilty.  · Lack of energy or motivation.  · Trouble concentrating.  · Eating more or less than usual.  · Thinking a lot about death or suicide.  Symptoms of bipolar mania include abnormally elevated or irritable mood and increased energy or activity, plus three or more of the following:    · More confidence than normal or feeling that you are able to do anything (grandiosity).  · Feeling rested with less sleep than normal.    · Being easily distracted.    · Talking more than usual or feeling pressured to keep talking.    · Feeling that your thoughts are racing.  · Engaging in high-risk activities such as buying sprees or foolish business decisions.  DIAGNOSIS   ScAD is diagnosed through an assessment by your health care provider. Your health care provider will observe and ask questions about your thoughts, behavior, mood, and ability to function in daily life. Your health care provider may also ask questions about your medical history and use of drugs, including prescription medicines. Your health care provider may also order blood tests and imaging exams. Certain medical conditions and substances can cause symptoms that resemble ScAD. Your health care provider may refer you to a mental health specialist for evaluation.   ScAD is divided into two types. The depressive type is diagnosed if your mood symptoms are limited to major depression. The bipolar type is diagnosed if your mood symptoms are manic or a mixture of manic and depressive symptoms  TREATMENT   ScAD is usually a life-long illness. Long-term treatment is necessary. The following treatments are available:  · Medicine Different types of   medicine are used to treat ScAD. The exact combination depends on the type and severity of your symptoms. Antipsychotic medicine is used to control psychotic symptoms such as delusions, paranoia, and hallucinations. Mood stabilizers can  even the highs and lows of bipolar manic mood swings. Antidepressant medicines are used to treat major depressive symptoms.  · Counseling or talk therapy Individual, group, or family counseling may be helpful in providing education, support, and guidance. Many people with ScAD also benefit from social skills and job skills (vocational) training.  A combination of medicine and counseling is usually best for managing the disorder over time. A procedure in which electricity is applied to the brain through the scalp (electroconvulsive therapy) may be used to treat people with severe manic symptoms that do not respond to medicine and counseling.  HOME CARE INSTRUCTIONS   · Take all your medicine as prescribed.  · Check with your health care provider before starting new prescription or over-the-counter medicines.  · Keep all follow up appointments with your health care provider.  SEEK MEDICAL CARE IF:   · If you are not able to take your medicines as prescribed.  · If your symptoms get worse.  SEEK IMMEDIATE MEDICAL CARE IF:   · You have serious thoughts about hurting yourself or others.  Document Released: 05/24/2006 Document Revised: 11/01/2012 Document Reviewed: 08/25/2012  ExitCare® Patient Information ©2014 [employer].

## 2013-06-07 NOTE — Progress Notes (Signed)
ED CM spoke with pt who confirmed he has no pcp States he previously saw a dr blount and davis years ago CM provided pt with a list of medicare in network providers (internal medicine and geriatric psychiatrists) [place] [phone] Pt appreciative of resources offered

## 2014-03-17 ENCOUNTER — Encounter (HOSPITAL_COMMUNITY): Payer: Self-pay | Admitting: Emergency Medicine

## 2014-03-17 ENCOUNTER — Emergency Department (HOSPITAL_COMMUNITY)
Admission: EM | Admit: 2014-03-17 | Discharge: 2014-03-17 | Disposition: A | Discharge status: Home / Self Care | Payer: Medicare Other | Attending: Emergency Medicine | Admitting: Emergency Medicine

## 2014-03-17 DIAGNOSIS — Z87438 Personal history of other diseases of male genital organs: Secondary | ICD-10-CM | POA: Insufficient documentation

## 2014-03-17 DIAGNOSIS — Z87891 Personal history of nicotine dependence: Secondary | ICD-10-CM | POA: Insufficient documentation

## 2014-03-17 DIAGNOSIS — Z8659 Personal history of other mental and behavioral disorders: Secondary | ICD-10-CM | POA: Insufficient documentation

## 2014-03-17 DIAGNOSIS — R339 Retention of urine, unspecified: Secondary | ICD-10-CM | POA: Diagnosis present

## 2014-03-17 DIAGNOSIS — I10 Essential (primary) hypertension: Secondary | ICD-10-CM | POA: Insufficient documentation

## 2014-03-17 DIAGNOSIS — Z8619 Personal history of other infectious and parasitic diseases: Secondary | ICD-10-CM | POA: Diagnosis not present

## 2014-03-17 DIAGNOSIS — R319 Hematuria, unspecified: Secondary | ICD-10-CM

## 2014-03-17 DIAGNOSIS — N390 Urinary tract infection, site not specified: Secondary | ICD-10-CM | POA: Diagnosis not present

## 2014-03-17 LAB — URINALYSIS, ROUTINE W REFLEX MICROSCOPIC
Bilirubin Urine: NEGATIVE
Glucose, UA: NEGATIVE mg/dL
Ketones, ur: NEGATIVE mg/dL
Nitrite: NEGATIVE
PROTEIN: 30 mg/dL — AB
Specific Gravity, Urine: 1.01 (ref 1.005–1.030)
Urobilinogen, UA: 0.2 mg/dL (ref 0.0–1.0)
pH: 6.5 (ref 5.0–8.0)

## 2014-03-17 LAB — URINE MICROSCOPIC-ADD ON

## 2014-03-17 MED ORDER — CEPHALEXIN 250 MG PO CAPS
500.0000 mg | ORAL_CAPSULE | Freq: Once | ORAL | Status: AC
  Administered 2014-03-17: 500 mg via ORAL
  Filled 2014-03-17: qty 2

## 2014-03-17 MED ORDER — CEPHALEXIN 500 MG PO CAPS: 500.0000 mg | ORAL_CAPSULE | Freq: Two times a day (BID) | ORAL | Status: DC

## 2014-03-17 NOTE — Discharge Instructions (Signed)
Acute Urinary Retention °Acute urinary retention is the temporary inability to urinate. °This is a common problem in older men. As men age their prostates become larger and block the flow of urine from the bladder. This is usually a problem that has come on gradually.  °HOME CARE INSTRUCTIONS °If you are sent home with a Foley catheter and a drainage system, you will need to discuss the best course of action with your health care provider. While the catheter is in, maintain a good intake of fluids. Keep the drainage bag emptied and lower than your catheter. This is so that contaminated urine will not flow back into your bladder, which could lead to a urinary tract infection. °There are two main types of drainage bags. One is a large bag that usually is used at night. It has a good capacity that will allow you to sleep through the night without having to empty it. The second type is called a leg bag. It has a smaller capacity, so it needs to be emptied more frequently. However, the main advantage is that it can be attached by a leg strap and can go underneath your clothing, allowing you the freedom to move about or leave your home. °Only take over-the-counter or prescription medicines for pain, discomfort, or fever as directed by your health care provider.  °SEEK MEDICAL CARE IF: °· You develop a low-grade fever. °· You experience spasms or leakage of urine with the spasms. °SEEK IMMEDIATE MEDICAL CARE IF:  °1. You develop chills or fever. °2. Your catheter stops draining urine. °3. Your catheter falls out. °4. You start to develop increased bleeding that does not respond to rest and increased fluid intake. °MAKE SURE YOU: °1. Understand these instructions. °2. Will watch your condition. °3. Will get help right away if you are not doing well or get worse. °Document Released: 04/19/2000 Document Revised: 01/16/2013 Document Reviewed: 06/22/2012 °ExitCare® Patient Information ©2015 [employer]. This information is  not intended to replace advice given to you by your health care provider. Make sure you discuss any questions you have with your health care provider. ° °Urinary Tract Infection °Urinary tract infections (UTIs) can develop anywhere along your urinary tract. Your urinary tract is your body's drainage system for removing wastes and extra water. Your urinary tract includes two kidneys, two ureters, a bladder, and a urethra. Your kidneys are a pair of bean-shaped organs. Each kidney is about the size of your fist. They are located below your ribs, one on each side of your spine. °CAUSES °Infections are caused by microbes, which are microscopic organisms, including fungi, viruses, and bacteria. These organisms are so small that they can only be seen through a microscope. Bacteria are the microbes that most commonly cause UTIs. °SYMPTOMS  °Symptoms of UTIs may vary by age and gender of the patient and by the location of the infection. Symptoms in young women typically include a frequent and intense urge to urinate and a painful, burning feeling in the bladder or urethra during urination. Older women and men are more likely to be tired, shaky, and weak and have muscle aches and abdominal pain. A fever may mean the infection is in your kidneys. Other symptoms of a kidney infection include pain in your back or sides below the ribs, nausea, and vomiting. °DIAGNOSIS °To diagnose a UTI, your caregiver will ask you about your symptoms. Your caregiver also will ask to provide a urine sample. The urine sample will be tested for bacteria and white   blood cells. White blood cells are made by your body to help fight infection. °TREATMENT  °Typically, UTIs can be treated with medication. Because most UTIs are caused by a bacterial infection, they usually can be treated with the use of antibiotics. The choice of antibiotic and length of treatment depend on your symptoms and the type of bacteria causing your infection. °HOME CARE  INSTRUCTIONS °· If you were prescribed antibiotics, take them exactly as your caregiver instructs you. Finish the medication even if you feel better after you have only taken some of the medication. °· Drink enough water and fluids to keep your urine clear or pale yellow. °· Avoid caffeine, tea, and carbonated beverages. They tend to irritate your bladder. °· Empty your bladder often. Avoid holding urine for long periods of time. °· Empty your bladder before and after sexual intercourse. °· After a bowel movement, women should cleanse from front to back. Use each tissue only once. °SEEK MEDICAL CARE IF:  °5. You have back pain. °6. You develop a fever. °7. Your symptoms do not begin to resolve within 3 days. °SEEK IMMEDIATE MEDICAL CARE IF:  °4. You have severe back pain or lower abdominal pain. °5. You develop chills. °6. You have nausea or vomiting. °7. You have continued burning or discomfort with urination. °MAKE SURE YOU:  °1. Understand these instructions. °2. Will watch your condition. °3. Will get help right away if you are not doing well or get worse. °Document Released: 10/21/2004 Document Revised: 07/13/2011 Document Reviewed: 02/19/2011 °ExitCare® Patient Information ©2015 [employer]. This information is not intended to replace advice given to you by your health care provider. Make sure you discuss any questions you have with your health care provider. ° ° °Foley Catheter Care °A Foley catheter is a soft, flexible tube that is placed into the bladder to drain urine. A Foley catheter may be inserted if: °· You leak urine or are not able to control when you urinate (urinary incontinence). °· You are not able to urinate when you need to (urinary retention). °· You had prostate surgery or surgery on the genitals. °· You have certain medical conditions, such as multiple sclerosis, dementia, or a spinal cord injury. °If you are going home with a Foley catheter in place, follow the instructions  below. °TAKING CARE OF THE CATHETER °8. Wash your hands with soap and water. °9. Using mild soap and warm water on a clean washcloth: °· Clean the area on your body closest to the catheter insertion site using a circular motion, moving away from the catheter. Never wipe toward the catheter because this could sweep bacteria up into the urethra and cause infection. °· Remove all traces of soap. Pat the area dry with a clean towel. For males, reposition the foreskin. °10. Attach the catheter to your leg so there is no tension on the catheter. Use adhesive tape or a leg strap. If you are using adhesive tape, remove any sticky residue left behind by the previous tape you used. °11. Keep the drainage bag below the level of the bladder, but keep it off the floor. °12. Check throughout the day to be sure the catheter is working and urine is draining freely. Make sure the tubing does not become kinked. °13. Do not pull on the catheter or try to remove it. Pulling could damage internal tissues. °TAKING CARE OF THE DRAINAGE BAGS °You will be given two drainage bags to take home. One is a large overnight drainage bag, and   the other is a smaller leg bag that fits underneath clothing. You may wear the overnight bag at any time, but you should never wear the smaller leg bag at night. Follow the instructions below for how to empty, change, and clean your drainage bags. °Emptying the Drainage Bag °You must empty your drainage bag when it is  -½ full or at least 2-3 times a day. °8. Wash your hands with soap and water. °9. Keep the drainage bag below your hips, below the level of your bladder. This stops urine from going back into the tubing and into your bladder. °10. Hold the dirty bag over the toilet or a clean container. °11. Open the pour spout at the bottom of the bag and empty the urine into the toilet or container. Do not let the pour spout touch the toilet, container, or any other surface. Doing so can place bacteria on the  bag, which can cause an infection. °12. Clean the pour spout with a gauze pad or cotton ball that has rubbing alcohol on it. °13. Close the pour spout. °14. Attach the bag to your leg with adhesive tape or a leg strap. °15. Wash your hands well. °Changing the Drainage Bag °Change your drainage bag once a month or sooner if it starts to smell bad or look dirty. Below are steps to follow when changing the drainage bag. °4. Wash your hands with soap and water. °5. Pinch off the rubber catheter so that urine does not spill out. °6. Disconnect the catheter tube from the drainage tube at the connection valve. Do not let the tubes touch any surface. °7. Clean the end of the catheter tube with an alcohol wipe. Use a different alcohol wipe to clean the end of the drainage tube. °8. Connect the catheter tube to the drainage tube of the clean drainage bag. °9. Attach the new bag to the leg with adhesive tape or a leg strap. Avoid attaching the new bag too tightly. °10. Wash your hands well. °Cleaning the Drainage Bag °1. Wash your hands with soap and water. °2. Wash the bag in warm, soapy water. °3. Rinse the bag thoroughly with warm water. °4. Fill the bag with a solution of white vinegar and water (1 cup vinegar to 1 qt warm water [.2 L vinegar to 1 L warm water]). Close the bag and soak it for 30 minutes in the solution. °5. Rinse the bag with warm water. °6. Hang the bag to dry with the pour spout open and hanging downward. °7. Store the clean bag (once it is dry) in a clean plastic bag. °8. Wash your hands well. °PREVENTING INFECTION °· Wash your hands before and after handling your catheter. °· Take showers daily and wash the area where the catheter enters your body. Do not take baths. Replace wet leg straps with dry ones, if this applies. °· Do not use powders, sprays, or lotions on the genital area. Only use creams, lotions, or ointments as directed by your caregiver. °· For females, wipe from front to back after each  bowel movement. °· Drink enough fluids to keep your urine clear or pale yellow unless you have a fluid restriction. °· Do not let the drainage bag or tubing touch or lie on the floor. °· Wear cotton underwear to absorb moisture and to keep your skin drier. °SEEK MEDICAL CARE IF:  °· Your urine is cloudy or smells unusually bad. °· Your catheter becomes clogged. °· You are not draining urine   into the bag or your bladder feels full. °· Your catheter starts to leak. °SEEK IMMEDIATE MEDICAL CARE IF:  °· You have pain, swelling, redness, or pus where the catheter enters the body. °· You have pain in the abdomen, legs, lower back, or bladder. °· You have a fever. °· You see blood fill the catheter, or your urine is pink or red. °· You have nausea, vomiting, or chills. °· Your catheter gets pulled out. °MAKE SURE YOU:  °· Understand these instructions. °· Will watch your condition. °· Will get help right away if you are not doing well or get worse. °Document Released: 01/11/2005 Document Revised: 05/28/2013 Document Reviewed: 01/03/2012 °ExitCare® Patient Information ©2015 [employer]. This information is not intended to replace advice given to you by your health care provider. Make sure you discuss any questions you have with your health care provider. ° °

## 2014-03-17 NOTE — ED Provider Notes (Signed)
This chart was scribed for Layla MawKristen N Fay Swider, DO by Roxy Cedarhandni Bhalodia, ED Scribe. This patient was seen in room D30C/D30C and the patient's care was started at 3:47 AM.   TIME SEEN: 3:47 AM   CHIEF COMPLAINT: Urinary Retention  HPI: [name] is a 74 y.o. male with history of schizophrenia, hypertension, BPH, prior episodes of urinary retention in the setting of urinary tract infections who is followed by [facility] who presents to the Emergency Department complaining of moderate urinary retention that began yesterday morning. He states he last urinated 24 hours ago. He reports abdominal distention. He also reports associated dysuria, and penile pain.  No new back pain, numbness, tingling or focal weakness.  Normal penis ROS: See HPI Constitutional: no fever  Eyes: no drainage  ENT: no runny nose   Cardiovascular:  no chest pain  Resp: no SOB  GI: no vomiting; abdominal pain GU: Dysuria; Urinary retention; Penile pain Integumentary: no rash  Allergy: no hives  Musculoskeletal: no leg swelling  Neurological: no slurred speech ROS otherwise negative  PAST MEDICAL HISTORY/PAST SURGICAL HISTORY:  Past Medical History  Diagnosis Date  . Schizophrenia   . Hepatitis C   . Benign hypertrophy of prostate   . Urinary tract infection     MEDICATIONS:  Prior to Admission medications   Not on File    ALLERGIES:  No Known Allergies  SOCIAL HISTORY:  History  Substance Use Topics  . Smoking status: Former Smoker -- 1.00 packs/day for 20 years    Quit date: 06/11/1975  . Smokeless tobacco: Not on file  . Alcohol Use: No    FAMILY HISTORY: No family history on file.  EXAM: Triage Vitals: BP 142/84 mmHg  Pulse 103  Temp(Src) 97.8 F (36.6 C) (Oral)  Resp 16  Ht 5' 8.5" (1.74 m)  Wt 160 lb (72.576 kg)  BMI 23.97 kg/m2  SpO2 96%  CONSTITUTIONAL: Alert and oriented and responds appropriately to questions. Well-appearing; well-nourished HEAD: Normocephalic EYES:  Conjunctivae clear, PERRL ENT: normal nose; no rhinorrhea; moist mucous membranes; pharynx without lesions noted NECK: Supple, no meningismus, no LAD  CARD: RRR; S1 and S2 appreciated; no murmurs, no clicks, no rubs, no gallops RESP: Normal chest excursion without splinting or tachypnea; breath sounds clear and equal bilaterally; no wheezes, no rhonchi, no rales,  ABD/GI: Normal bowel sounds; abdomen distended with tenderness in the suprapubic region, no tympany or fluid wave, no guarding or rebound, no peritoneal signs GU: Normal urethral meatus. No penile discharge. No testicular pain or masses. No scrotal swelling. BACK:  The back appears normal and is non-tender to palpation, there is no CVA tenderness EXT: Normal ROM in all joints; non-tender to palpation; no edema; normal capillary refill; no cyanosis    SKIN: Normal color for age and race; warm NEURO: Moves all extremities equally sensation to light touch intact diffusely, cranial nerves II through XII intact, no clonus at 2+ deep tendon reflexes bilaterally PSYCH: The patient's mood and manner are appropriate. Grooming and personal hygiene are appropriate.  MEDICAL DECISION MAKING:   Patient here with urinary retention likely in the setting of BPH and possible UTI. No new neurologic deficits or back pain. No new medications. Has had similar symptoms before. Reports his last Foley catheter was one year ago. Is followed by [facility].  16 [place] cou[place] catheter was placed with good relief of symptoms and improvement patient's blood pressure. It appears he has possible urinary tract infection (RBCs and leukocytes). Culture pending.  Will discharge on Keflex given patient reports that this has happened multiple times in the past with UTIs. We'll give him [facility] follow-up. Patient verbalizes understanding and is comfortable with this plan.  Layla Maw Cloyde Oregel, DO 03/17/14 [phone]

## 2014-03-17 NOTE — ED Notes (Signed)
Pt reports urinary retention, pt reports he has been unable to urinate since yesterday morning, almost 24 hours ago. Pt reports abdominal distention, painful urination, and penile pain also.

## 2014-03-18 LAB — URINE CULTURE
COLONY COUNT: NO GROWTH
CULTURE: NO GROWTH

## 2014-03-20 ENCOUNTER — Encounter (HOSPITAL_COMMUNITY): Payer: Self-pay | Admitting: *Deleted

## 2014-03-20 ENCOUNTER — Emergency Department (HOSPITAL_COMMUNITY)
Admission: EM | Admit: 2014-03-20 | Discharge: 2014-03-20 | Disposition: A | Discharge status: Home / Self Care | Payer: Medicare Other | Attending: Emergency Medicine | Admitting: Emergency Medicine

## 2014-03-20 DIAGNOSIS — Z8659 Personal history of other mental and behavioral disorders: Secondary | ICD-10-CM | POA: Insufficient documentation

## 2014-03-20 DIAGNOSIS — Z87440 Personal history of urinary (tract) infections: Secondary | ICD-10-CM | POA: Diagnosis not present

## 2014-03-20 DIAGNOSIS — Z8619 Personal history of other infectious and parasitic diseases: Secondary | ICD-10-CM | POA: Insufficient documentation

## 2014-03-20 DIAGNOSIS — Z87448 Personal history of other diseases of urinary system: Secondary | ICD-10-CM | POA: Insufficient documentation

## 2014-03-20 DIAGNOSIS — Z792 Long term (current) use of antibiotics: Secondary | ICD-10-CM | POA: Diagnosis not present

## 2014-03-20 DIAGNOSIS — R339 Retention of urine, unspecified: Secondary | ICD-10-CM

## 2014-03-20 DIAGNOSIS — Z87891 Personal history of nicotine dependence: Secondary | ICD-10-CM | POA: Diagnosis not present

## 2014-03-20 LAB — URINALYSIS, ROUTINE W REFLEX MICROSCOPIC
BILIRUBIN URINE: NEGATIVE
GLUCOSE, UA: NEGATIVE mg/dL
KETONES UR: NEGATIVE mg/dL
Nitrite: POSITIVE — AB
PH: 7.5 (ref 5.0–8.0)
Protein, ur: 100 mg/dL — AB
Specific Gravity, Urine: 1.023 (ref 1.005–1.030)
Urobilinogen, UA: 1 mg/dL (ref 0.0–1.0)

## 2014-03-20 LAB — URINE MICROSCOPIC-ADD ON

## 2014-03-20 MED ORDER — CEFTRIAXONE SODIUM 1 G IJ SOLR
1.0000 g | Freq: Once | INTRAMUSCULAR | Status: AC
  Administered 2014-03-20: 1 g via INTRAMUSCULAR
  Filled 2014-03-20: qty 10

## 2014-03-20 MED ORDER — LIDOCAINE HCL (PF) 1 % IJ SOLN
5.0000 mL | Freq: Once | INTRAMUSCULAR | Status: AC
  Administered 2014-03-20: 5 mL
  Filled 2014-03-20: qty 5

## 2014-03-20 NOTE — Discharge Instructions (Signed)
Please follow the directions provided.  It is very important for you to follow-up with [facility] for help with this catheter.  If you don't you can get a worsening infection in your bladder.  Please do not pull at any of the connections of your catheter.  If the connections come loose, do not touch the end of the connection, just re-connect it.  You were treated with a shot of antibiotics in the emergency department but please continue the antibiotic you were prescribed before.  Don't hesitate to return for any new, worsening or concerning symptoms.    SEEK IMMEDIATE MEDICAL CARE IF:  You develop chills or fever.  Your catheter stops draining urine.  Your catheter falls out.  You start to develop increased bleeding that does not respond to rest and increased fluid intake.

## 2014-03-20 NOTE — ED Provider Notes (Signed)
CSN: [id number]     Arrival date & time 03/20/14  1307 History   First MD Initiated Contact with Patient 03/20/14 1620     Chief Complaint  Patient presents with  . Urinary Retention   (Consider location/radiation/quality/duration/timing/severity/associated sxs/prior Treatment) HPI  [name] is a 74 yo male presenting with report of problems with his indwelling catheter leg bag.  He was seen in the ED 3 days ago for report of not urinating in 24 hours.  A foley was placed and urine was sent for analysis.  He was given a prescription of keflex at discharge and instructed to follow up with [facility].  He states the bag became disconnected from the bag and he needs help getting the bag reconnected. He denies any chills, abd pain, nausea, or vomiting.     Past Medical History  Diagnosis Date  . Schizophrenia   . Hepatitis C   . Benign hypertrophy of prostate   . Urinary tract infection    Past Surgical History  Procedure Laterality Date  . Knee surgery      right   No family history on file. History  Substance Use Topics  . Smoking status: Former Smoker -- 1.00 packs/day for 20 years    Quit date: 06/11/1975  . Smokeless tobacco: Not on file  . Alcohol Use: No    Review of Systems  Constitutional: Negative for fever and chills.  HENT: Negative for sore throat.   Eyes: Negative for visual disturbance.  Respiratory: Negative for cough and shortness of breath.   Cardiovascular: Negative for chest pain and leg swelling.  Gastrointestinal: Negative for nausea, vomiting and diarrhea.  Genitourinary: Negative for dysuria.       Leg bag malfunction  Musculoskeletal: Negative for myalgias.  Skin: Negative for rash.  Neurological: Negative for weakness, numbness and headaches.      Allergies  Review of patient's allergies indicates no known allergies.  Home Medications   Prior to Admission medications   Medication Sig Start Date End Date Taking? Authorizing  Provider  cephALEXin (KEFLEX) 500 MG capsule Take 1 capsule (500 mg total) by mouth 2 (two) times daily. 03/17/14   Kristen N Ward, DO   BP 129/65 mmHg  Pulse 111  Temp(Src) 98.5 F (36.9 C) (Oral)  Resp 24  Ht 5' 8.5" (1.74 m)  Wt 165 lb (74.844 kg)  BMI 24.72 kg/m2  SpO2 96% Physical Exam  Constitutional: He appears well-developed and well-nourished. No distress.  HENT:  Head: Normocephalic and atraumatic.  Mouth/Throat: Oropharynx is clear and moist. No oropharyngeal exudate.  Eyes: Conjunctivae are normal.  Neck: Neck supple. No thyromegaly present.  Cardiovascular: Normal rate, regular rhythm and intact distal pulses.   Pulmonary/Chest: Effort normal and breath sounds normal. No respiratory distress. He has no wheezes. He has no rales. He exhibits no tenderness.  Abdominal: Soft. There is no tenderness.  Musculoskeletal: He exhibits no tenderness.  Lymphadenopathy:    He has no cervical adenopathy.  Neurological: He is alert.  Skin: Skin is warm and dry. No rash noted. He is not diaphoretic.  Psychiatric: His affect is inappropriate.  Nursing note and vitals reviewed.   ED Course  Procedures (including critical care time) Labs Review Labs Reviewed  URINALYSIS, ROUTINE W REFLEX MICROSCOPIC - Abnormal; Notable for the following:    Color, Urine AMBER (*)    APPearance TURBID (*)    Hgb urine dipstick LARGE (*)    Protein, ur 100 (*)  Nitrite POSITIVE (*)    Leukocytes, UA LARGE (*)    All other components within normal limits  URINE MICROSCOPIC-ADD ON - Abnormal; Notable for the following:    Squamous Epithelial / LPF FEW (*)    Bacteria, UA MANY (*)    All other components within normal limits  URINE CULTURE   Imaging Review No results found.   EKG Interpretation None      MDM   Final diagnoses:  Urinary retention   74 yo with occasional tangential thinking, presents with complaint of defective leg bag, no other complaints including no abd pain or  chills.  He was instructed to follow-up with [facility] after his last ED visit and he reports he was planning on waiting to go to the [name] first.  Discussed importance of [facility] as soon as possible for further mgmt.  Urine collected from triage from defective foley bag with nitrites and leukocytes, may be contaminated specimen but pt treated with IM rocephin in ED because I question adherence to abx prescription provided at last ED visit. Pt got angry during eval and demanded foley out.  Foley removed but then unable to void in the ED. 16 fr coude foley replaced with new leg bag set-up.  Discussed importance of not separating the foley connections and following up tomorrow with [facility]. Pt is well-appearing, in no acute distress and vital signs reviewed and not concerning. He appears safe to be discharged.  Return precautions provided.    Filed Vitals:   03/20/14 1700 03/20/14 1800 03/20/14 1830 03/20/14 2117  BP: 135/82 131/79 136/74   Pulse: 77 61 61   Temp:    98.5 F (36.9 C)  TempSrc:    Oral  Resp:      Height:      Weight:      SpO2: 98% 100% 99%    Meds given in ED:  Medications  cefTRIAXone (ROCEPHIN) injection 1 g (1 g Intramuscular Given 03/20/14 1733)  lidocaine (PF) (XYLOCAINE) 1 % injection 5 mL (5 mLs Other Given 03/20/14 1733)    Discharge Medication List as of 03/20/2014  8:09 PM         Harle Battiest, NP 03/22/14 0004  Ethelda Chick, MD 03/23/14 1150

## 2014-03-20 NOTE — ED Notes (Signed)
Pt made aware to try and urinate otherwise another catheter would have to be placed.

## 2014-03-20 NOTE — ED Notes (Signed)
OR called to see if they are able to place a leg bag on a coude cath with a connector. OR states they will tube it down.

## 2014-03-20 NOTE — ED Notes (Signed)
Patient was here for urinary problems on 02-21.  He states he was doing well until yesterday.  He is now having issues.  He is hard of hearing and has visual problems.

## 2014-03-22 LAB — URINE CULTURE: SPECIAL REQUESTS: NORMAL

## 2014-03-23 NOTE — Progress Notes (Signed)
ED Antimicrobial Stewardship Positive Culture Follow Up   [name] is an 74 y.o. male who [facility] with a chief complaint of  Chief Complaint  Patient presents with  . Urinary Retention    Recent Results (from the past 720 hour(s))  Urine culture     Status: None   Collection Time: 03/17/14  3:30 AM  Result Value Ref Range Status   Specimen Description URINE, CATHETERIZED  Final   Special Requests ADDED 0448  Final   Colony Count NO GROWTH Performed at [facility]   Final   Culture NO GROWTH Performed at [facility]   Final   Report Status 03/18/2014 FINAL  Final  Urine culture     Status: None   Collection Time: 03/20/14  4:36 PM  Result Value Ref Range Status   Specimen Description URINE, CATHETERIZED  Final   Special Requests Normal  Final   Colony Count   Final    >=100,000 COLONIES/ML Performed at [facility]    Culture   Final    ESCHERICHIA COLI Note: Confirmed Extended Spectrum Beta-Lactamase Producer (ESBL) Performed at [facility]    Report Status 03/22/2014 FINAL  Final   Organism ID, Bacteria ESCHERICHIA COLI  Final      Susceptibility   Escherichia coli - MIC*    AMPICILLIN >=32 RESISTANT Resistant     CEFAZOLIN >=64 RESISTANT Resistant     CEFTRIAXONE >=64 RESISTANT Resistant     CIPROFLOXACIN <=0.25 SENSITIVE Sensitive     GENTAMICIN <=1 SENSITIVE Sensitive     LEVOFLOXACIN <=0.12 SENSITIVE Sensitive     NITROFURANTOIN 32 SENSITIVE Sensitive     TOBRAMYCIN <=1 SENSITIVE Sensitive     TRIMETH/SULFA >=320 RESISTANT Resistant     IMIPENEM <=0.25 SENSITIVE Sensitive     PIP/TAZO <=4 SENSITIVE Sensitive     * ESCHERICHIA COLI    [x]  Treated with Keflex, organism resistant to prescribed antimicrobial  New antibiotic prescription: Ciprofloxacin 500 mg bid for 7 days  ED Provider: Celene Skeenobyn Hess, PA-C  Rolley SimsMartin, Bodi Palmeri Ann 03/23/2014, 4:37 PM Infectious Diseases Pharmacist Phone#  [phone]

## 2014-03-24 ENCOUNTER — Telehealth (HOSPITAL_BASED_OUTPATIENT_CLINIC_OR_DEPARTMENT_OTHER): Payer: Self-pay | Admitting: Emergency Medicine

## 2014-03-25 ENCOUNTER — Telehealth (HOSPITAL_BASED_OUTPATIENT_CLINIC_OR_DEPARTMENT_OTHER): Payer: Self-pay | Admitting: Emergency Medicine

## 2014-03-28 ENCOUNTER — Telehealth (HOSPITAL_COMMUNITY): Payer: Self-pay

## 2014-03-28 NOTE — Telephone Encounter (Signed)
Unable to reach x 3.  [facility]

## 2014-04-23 ENCOUNTER — Telehealth (HOSPITAL_COMMUNITY): Payer: Self-pay

## 2014-04-23 NOTE — ED Notes (Signed)
Unable to contact pt by mail or telephone. Unable to communicate lab results or treatment changes. 

## 2014-07-15 ENCOUNTER — Emergency Department (HOSPITAL_COMMUNITY)
Admission: EM | Admit: 2014-07-15 | Discharge: 2014-07-16 | Discharge status: Against Medical Advice | Payer: Medicare Other | Attending: Emergency Medicine | Admitting: Emergency Medicine

## 2014-07-15 ENCOUNTER — Emergency Department (HOSPITAL_COMMUNITY): Payer: Medicare Other

## 2014-07-15 ENCOUNTER — Encounter (HOSPITAL_COMMUNITY): Payer: Self-pay | Admitting: *Deleted

## 2014-07-15 DIAGNOSIS — Z87891 Personal history of nicotine dependence: Secondary | ICD-10-CM | POA: Diagnosis not present

## 2014-07-15 DIAGNOSIS — N508 Other specified disorders of male genital organs: Secondary | ICD-10-CM | POA: Diagnosis not present

## 2014-07-15 DIAGNOSIS — Z87440 Personal history of urinary (tract) infections: Secondary | ICD-10-CM | POA: Insufficient documentation

## 2014-07-15 DIAGNOSIS — N433 Hydrocele, unspecified: Secondary | ICD-10-CM | POA: Diagnosis not present

## 2014-07-15 DIAGNOSIS — Z8619 Personal history of other infectious and parasitic diseases: Secondary | ICD-10-CM | POA: Diagnosis not present

## 2014-07-15 DIAGNOSIS — R1900 Intra-abdominal and pelvic swelling, mass and lump, unspecified site: Secondary | ICD-10-CM | POA: Diagnosis present

## 2014-07-15 DIAGNOSIS — N509 Disorder of male genital organs, unspecified: Secondary | ICD-10-CM

## 2014-07-15 DIAGNOSIS — Z8659 Personal history of other mental and behavioral disorders: Secondary | ICD-10-CM | POA: Insufficient documentation

## 2014-07-15 DIAGNOSIS — R1909 Other intra-abdominal and pelvic swelling, mass and lump: Secondary | ICD-10-CM

## 2014-07-15 NOTE — ED Notes (Signed)
Pt states that he has had intermittent groin swelling for several years. Pt states that he has urinary retention as well. Pt has not been able to follow up with [facility].

## 2014-07-15 NOTE — ED Notes (Signed)
Pt still unable to urinate. Had to use the restroom while in [place] and didn't have a specimen cup.

## 2014-07-15 NOTE — ED Provider Notes (Addendum)
CSN: [id number]     Arrival date & time 07/15/14  1820 History   First MD Initiated Contact with Patient 07/15/14 2113     Chief Complaint  Patient presents with  . Groin Swelling     (Consider location/radiation/quality/duration/timing/severity/associated sxs/prior Treatment) HPI Comments: Patient here complaining of scrotal edema for several years. Also notes some dysuria for several years. Denies any penile drainage or discharge. Is able to urinate without difficult E. Has been referred to urologist but he has not kept that appointment. Denies any weight loss recently. No hematuria. Denies any pain to his scrotum. Symptoms have been persistent and nothing makes them better or worse. When questioned why he came today he says he did know and he states his symptoms have been unchanged for several years  The history is provided by the patient.    Past Medical History  Diagnosis Date  . Schizophrenia   . Hepatitis C   . Benign hypertrophy of prostate   . Urinary tract infection    Past Surgical History  Procedure Laterality Date  . Knee surgery      right   No family history on file. History  Substance Use Topics  . Smoking status: Former Smoker -- 1.00 packs/day for 20 years    Quit date: 06/11/1975  . Smokeless tobacco: Not on file  . Alcohol Use: No    Review of Systems  All other systems reviewed and are negative.     Allergies  Review of patient's allergies indicates no known allergies.  Home Medications   Prior to Admission medications   Medication Sig Start Date End Date Taking? Authorizing Provider  ibuprofen (ADVIL,MOTRIN) 200 MG tablet Take 400 mg by mouth every 6 (six) hours as needed for mild pain.   Yes Historical Provider, MD   BP 131/74 mmHg  Pulse 110  Temp(Src) 98.9 F (37.2 C) (Oral)  Resp 22  SpO2 99% Physical Exam  Constitutional: He is oriented to person, place, and time. He appears well-developed and well-nourished.  Non-toxic appearance.  No distress.  HENT:  Head: Normocephalic and atraumatic.  Eyes: Conjunctivae, EOM and lids are normal. Pupils are equal, round, and reactive to light.  Neck: Normal range of motion. Neck supple. No tracheal deviation present. No thyroid mass present.  Cardiovascular: Normal rate, regular rhythm and normal heart sounds.  Exam reveals no gallop.   No murmur heard. Pulmonary/Chest: Effort normal and breath sounds normal. No stridor. No respiratory distress. He has no decreased breath sounds. He has no wheezes. He has no rhonchi. He has no rales.  Abdominal: Soft. Normal appearance and bowel sounds are normal. He exhibits no distension. There is no tenderness. There is no rebound and no CVA tenderness.  Genitourinary: Right testis shows swelling. Right testis shows no tenderness. Left testis shows mass and swelling. Left testis shows no tenderness. Uncircumcised. No paraphimosis. No discharge found.  Musculoskeletal: Normal range of motion. He exhibits no edema or tenderness.  Lymphadenopathy:       Right: No inguinal adenopathy present.       Left: No inguinal adenopathy present.  Neurological: He is alert and oriented to person, place, and time. He has normal strength. No cranial nerve deficit or sensory deficit. GCS eye subscore is 4. GCS verbal subscore is 5. GCS motor subscore is 6.  Skin: Skin is warm and dry. No abrasion and no rash noted.  Psychiatric: He has a normal mood and affect. His speech is normal and behavior is normal.  Nursing note and vitals reviewed.   ED Course  Procedures (including critical care time) Labs Review Labs Reviewed  URINE CULTURE  URINALYSIS, ROUTINE W REFLEX MICROSCOPIC (NOT AT [facility])    Imaging Review No results found.   EKG Interpretation None      MDM   Final diagnoses:  Testicular abnormality    Patient's ultrasound negative for torsion or cancer but does show hydrocele. Will be given referral to urology. 12:49 AM Patient left AMA before  lab results   Lorre Nick, MD 07/16/14 1610  Lorre Nick, MD 07/16/14 [phone]

## 2014-07-16 LAB — URINE MICROSCOPIC-ADD ON

## 2014-07-16 LAB — URINALYSIS, ROUTINE W REFLEX MICROSCOPIC
Bilirubin Urine: NEGATIVE
Glucose, UA: NEGATIVE mg/dL
KETONES UR: NEGATIVE mg/dL
Nitrite: POSITIVE — AB
PROTEIN: 100 mg/dL — AB
Specific Gravity, Urine: 1.022 (ref 1.005–1.030)
Urobilinogen, UA: 1 mg/dL (ref 0.0–1.0)
pH: 6 (ref 5.0–8.0)

## 2014-07-16 NOTE — ED Notes (Signed)
Pt asked to provide a urine specimen but he stated he urinated while in the waiting room and he cant urinate now. Will re-assess shortly.

## 2014-07-16 NOTE — ED Notes (Signed)
Pt reminded that a urine sample is needed but he stated he did not have to go because he urinated while he was at [place]. Will re-assess shortly.

## 2014-07-16 NOTE — ED Notes (Signed)
This RN reminded patient that a urine sample is still needed.Pt states he still does not have to urinate. This RN asked him to try and handed him the urinal. Pt attempting to urinate now.

## 2014-07-16 NOTE — Discharge Instructions (Signed)
Hydrocele, Adult Fluid can collect around the testicles. This fluid forms in a sac. This condition is called a hydrocele. The collected fluid causes swelling of the scrotum. Usually, it affects just one testicle. Most of the time, the condition does not cause pain. Sometimes, the hydrocele goes away on its own. Other times, surgery is needed to get rid of the fluid. CAUSES A hydrocele does not develop often. Different things can cause a hydrocele in a man, including:  Injury to the scrotum.  Infection.  X-ray of the area around the scrotum.  A tumor or cancer of the testicle.  Twisting of a testicle.  Decreased blood flow to the scrotum. SYMPTOMS   Swelling without pain. The hydrocele feels like a water-filled balloon.  Swelling with pain. This can occur if the hydrocele was caused by infection or twisting.  Mild discomfort in the scrotum.  The hydrocele may feel heavy.  Swelling that gets smaller when you lie down. DIAGNOSIS  Your caregiver will do a physical exam to decide if you have a hydrocele. This may include:  Asking questions about your overall health, today and in the past. Your caregiver may ask about any injuries, X-rays, or infections.  Pushing on your abdomen or asking you to change positions to see if the size of the hydrocele changes.  Shining a light through the scrotum (transillumination) to see if the fluid inside the scrotum is clear.  Blood tests and urine tests to check for infection.  Imaging studies that take pictures of the scrotum and testicles. TREATMENT  Treatment depends in part on what caused the condition. Options include:  Watchful waiting. Your caregiver checks the hydrocele every so often.  Different surgeries to drain the fluid.  A needle may be put into the scrotum to drain fluid (needle aspiration). Fluid often returns after this type of treatment.  A cut (incision) may be made in the scrotum to remove the fluid sac  (hydrocelectomy).  An incision may be made in the groin to repair a hydrocele that has contact with abdominal fluids (communicating hydrocele).  Medicines to treat an infection (antibiotics). HOME CARE INSTRUCTIONS  What you need to do at home may depend on the cause of the hydrocele and type of treatment. In general:  Take all medicine as directed by your caregiver. Follow the directions carefully.  Ask your caregiver if there is anything you should not do while you recover (activities, lifting, work, sex).  If you had surgery to repair a communicating hydrocele, recovery time may vary. Ask you caregiver about your recovery time.  Avoid heavy lifting for 4 to 6 weeks.  If you had an incision on the scrotum or groin, wash it for 2 to 3 days after surgery. Do this as long as the skin is closed and there are no gaps in the wound. Wash gently, and avoid rubbing the incision.  Keep all follow-up appointments. SEEK MEDICAL CARE IF:   Your scrotum seems to be getting larger.  The area becomes more and more uncomfortable. SEEK IMMEDIATE MEDICAL CARE IF:  You have a fever. Document Released: 07/01/2009 Document Revised: 11/01/2012 Document Reviewed: 07/01/2009 ExitCare Patient Information 2015 [employer]. This information is not intended to replace advice given to you by your health care provider. Make sure you discuss any questions you have with your health care provider.  

## 2014-07-16 NOTE — ED Notes (Signed)
Pt walked up to nurses station and was dressed in his normal clothing and stated he was ready to go. Pt was informed that the results of his urine sample had not come back yet. He stated the doctor didn't tell him anything about medications or anything. He was informed that is because the urinalysis had not resulted yet. Pt proceeded to walk out of the ED and left. Pt encouraged to stay to at least wait on the urinalysis but he refused. Dr. Hessie Diener informed.

## 2014-07-20 LAB — URINE CULTURE: Culture: >=100000

## 2014-07-21 ENCOUNTER — Telehealth: Payer: Self-pay | Admitting: Emergency Medicine

## 2014-07-21 NOTE — Progress Notes (Cosign Needed)
ED Antimicrobial Stewardship Positive Culture Follow Up   [name] is an 74 y.o. male who presented to [facility] on 07/15/2014 with a chief complaint of  Chief Complaint  Patient presents with  . Groin Swelling    Recent Results (from the past 720 hour(s))  Urine culture     Status: None   Collection Time: 07/16/14 12:21 AM  Result Value Ref Range Status   Specimen Description URINE, CLEAN CATCH  Final   Special Requests NONE  Final   Culture   Final    >=100,000 COLONIES/mL ESCHERICHIA COLI 90,000 COLONIES/ml ESCHERICHIA COLI TWO DIFFERENT MORPHOTYPES    Report Status 07/20/2014 FINAL  Final   Organism ID, Bacteria ESCHERICHIA COLI  Final   Organism ID, Bacteria ESCHERICHIA COLI  Final      Susceptibility   Escherichia coli - MIC*    AMPICILLIN >=32 RESISTANT Resistant     CEFAZOLIN <=4 SENSITIVE Sensitive     CEFTRIAXONE <=1 SENSITIVE Sensitive     CIPROFLOXACIN <=0.25 SENSITIVE Sensitive     GENTAMICIN >=16 RESISTANT Resistant     IMIPENEM <=0.25 SENSITIVE Sensitive     NITROFURANTOIN <=16 SENSITIVE Sensitive     TRIMETH/SULFA >=320 RESISTANT Resistant     AMPICILLIN/SULBACTAM 16 INTERMEDIATE Intermediate     PIP/TAZO <=4 SENSITIVE Sensitive    Escherichia coli - MIC*    AMPICILLIN >=32 RESISTANT Resistant     CEFAZOLIN <=4 SENSITIVE Sensitive     CEFTRIAXONE <=1 SENSITIVE Sensitive     CIPROFLOXACIN <=0.25 SENSITIVE Sensitive     GENTAMICIN >=16 RESISTANT Resistant     IMIPENEM <=0.25 SENSITIVE Sensitive     NITROFURANTOIN <=16 SENSITIVE Sensitive     TRIMETH/SULFA >=320 RESISTANT Resistant     AMPICILLIN/SULBACTAM >=32 RESISTANT Resistant     PIP/TAZO <=4 SENSITIVE Sensitive     * >=100,000 COLONIES/mL ESCHERICHIA COLI    90,000 COLONIES/ml ESCHERICHIA COLI   [x]  Patient discharged originally without antimicrobial agent and treatment is now indicated  New antibiotic prescription: Cephalexin 500mg  PO BID x 7 days  ED Provider: Beatrix Shipper 07/21/2014, 8:42 AM Infectious Diseases Pharmacist Phone# [phone]

## 2014-07-23 ENCOUNTER — Telehealth (HOSPITAL_BASED_OUTPATIENT_CLINIC_OR_DEPARTMENT_OTHER): Payer: Self-pay | Admitting: Emergency Medicine

## 2014-07-24 ENCOUNTER — Telehealth (HOSPITAL_COMMUNITY): Payer: Self-pay

## 2014-07-24 NOTE — Telephone Encounter (Signed)
Unable to reach by telephone. Letter sent to address on file

## 2014-08-11 ENCOUNTER — Telehealth (HOSPITAL_BASED_OUTPATIENT_CLINIC_OR_DEPARTMENT_OTHER): Payer: Self-pay | Admitting: Emergency Medicine

## 2014-08-11 NOTE — Telephone Encounter (Signed)
Letter returned, no known forwarding address, lost to followup 

## 2014-09-16 ENCOUNTER — Telehealth (HOSPITAL_COMMUNITY): Payer: Self-pay

## 2014-09-16 NOTE — Telephone Encounter (Signed)
Unable to contact pt by mail or telephone. Unable to communicate lab results or treatment changes. 

## 2014-12-15 ENCOUNTER — Emergency Department (HOSPITAL_COMMUNITY)
Admission: EM | Admit: 2014-12-15 | Discharge: 2014-12-16 | Disposition: A | Discharge status: Other Acute Inpatient Hospital | Payer: Medicare Other | Attending: Emergency Medicine | Admitting: Emergency Medicine

## 2014-12-15 ENCOUNTER — Encounter (HOSPITAL_COMMUNITY): Payer: Self-pay | Admitting: Emergency Medicine

## 2014-12-15 ENCOUNTER — Emergency Department (HOSPITAL_COMMUNITY): Payer: Medicare Other

## 2014-12-15 DIAGNOSIS — F23 Brief psychotic disorder: Secondary | ICD-10-CM | POA: Insufficient documentation

## 2014-12-15 DIAGNOSIS — R0989 Other specified symptoms and signs involving the circulatory and respiratory systems: Secondary | ICD-10-CM | POA: Diagnosis not present

## 2014-12-15 DIAGNOSIS — R1084 Generalized abdominal pain: Secondary | ICD-10-CM | POA: Diagnosis present

## 2014-12-15 DIAGNOSIS — R630 Anorexia: Secondary | ICD-10-CM | POA: Insufficient documentation

## 2014-12-15 DIAGNOSIS — Z87891 Personal history of nicotine dependence: Secondary | ICD-10-CM | POA: Diagnosis not present

## 2014-12-15 DIAGNOSIS — Z8659 Personal history of other mental and behavioral disorders: Secondary | ICD-10-CM | POA: Insufficient documentation

## 2014-12-15 DIAGNOSIS — Z87438 Personal history of other diseases of male genital organs: Secondary | ICD-10-CM | POA: Diagnosis not present

## 2014-12-15 DIAGNOSIS — F250 Schizoaffective disorder, bipolar type: Secondary | ICD-10-CM | POA: Diagnosis present

## 2014-12-15 DIAGNOSIS — R110 Nausea: Secondary | ICD-10-CM | POA: Diagnosis not present

## 2014-12-15 DIAGNOSIS — Z8619 Personal history of other infectious and parasitic diseases: Secondary | ICD-10-CM | POA: Diagnosis not present

## 2014-12-15 DIAGNOSIS — Z87440 Personal history of urinary (tract) infections: Secondary | ICD-10-CM | POA: Insufficient documentation

## 2014-12-15 LAB — CBC WITH DIFFERENTIAL/PLATELET
BASOS ABS: 0 10*3/uL (ref 0.0–0.1)
Basophils Relative: 0 %
EOS PCT: 1 %
Eosinophils Absolute: 0.1 10*3/uL (ref 0.0–0.7)
HEMATOCRIT: 33.9 % — AB (ref 39.0–52.0)
Hemoglobin: 10 g/dL — ABNORMAL LOW (ref 13.0–17.0)
LYMPHS ABS: 1.5 10*3/uL (ref 0.7–4.0)
LYMPHS PCT: 19 %
MCH: 23.3 pg — AB (ref 26.0–34.0)
MCHC: 29.5 g/dL — ABNORMAL LOW (ref 30.0–36.0)
MCV: 79 fL (ref 78.0–100.0)
MONO ABS: 0.7 10*3/uL (ref 0.1–1.0)
Monocytes Relative: 8 %
NEUTROS ABS: 5.7 10*3/uL (ref 1.7–7.7)
Neutrophils Relative %: 72 %
Platelets: 294 10*3/uL (ref 150–400)
RBC: 4.29 MIL/uL (ref 4.22–5.81)
RDW: 15.9 % — ABNORMAL HIGH (ref 11.5–15.5)
WBC: 8 10*3/uL (ref 4.0–10.5)

## 2014-12-15 LAB — COMPREHENSIVE METABOLIC PANEL
ALBUMIN: 3.3 g/dL — AB (ref 3.5–5.0)
ALK PHOS: 90 U/L (ref 38–126)
ALT: 17 U/L (ref 17–63)
ANION GAP: 8 (ref 5–15)
AST: 26 U/L (ref 15–41)
BILIRUBIN TOTAL: 1 mg/dL (ref 0.3–1.2)
BUN: 7 mg/dL (ref 6–20)
CO2: 28 mmol/L (ref 22–32)
Calcium: 9.4 mg/dL (ref 8.9–10.3)
Chloride: 103 mmol/L (ref 101–111)
Creatinine, Ser: 1.25 mg/dL — ABNORMAL HIGH (ref 0.61–1.24)
GFR calc Af Amer: >60 mL/min (ref >60)
GFR calc non Af Amer: 56 mL/min — ABNORMAL LOW (ref >60)
GLUCOSE: 106 mg/dL — AB (ref 65–99)
Potassium: 4.4 mmol/L (ref 3.5–5.1)
SODIUM: 139 mmol/L (ref 135–145)
TOTAL PROTEIN: 7.7 g/dL (ref 6.5–8.1)

## 2014-12-15 LAB — I-STAT TROPONIN, ED: Troponin i, poc: 0 ng/mL (ref 0.00–0.08)

## 2014-12-15 LAB — ETHANOL: Alcohol, Ethyl (B): <5 mg/dL (ref <5)

## 2014-12-15 MED ORDER — ZIPRASIDONE MESYLATE 20 MG IM SOLR
20.0000 mg | Freq: Once | INTRAMUSCULAR | Status: AC
  Administered 2014-12-15: 20 mg via INTRAMUSCULAR

## 2014-12-15 NOTE — ED Notes (Signed)
Staffing called for sitter, sitter may not be available until 11pm.

## 2014-12-15 NOTE — ED Notes (Addendum)
Pt arrives w/c and c/o choking with food or water. Pt states he was supposed to get meds but was unable to afford them. Pt also speaks loudly and state he has a penis, not a vagina.

## 2014-12-15 NOTE — ED Notes (Signed)
Pt states that he is going leaving here to go to [place], pt states he is going to talk to Trump personally and tell him about the about the care he has received here because he cant swallow water. Pt stated yelling stating that he wants to talk to Trump.

## 2014-12-15 NOTE — ED Notes (Signed)
The patient is unable to give an urine specimen at this time. The tech has reported to the RN in charge. 

## 2014-12-15 NOTE — ED Notes (Addendum)
Called pt to be triaged with no answer.

## 2014-12-15 NOTE — ED Notes (Signed)
Pt reporting to leave AMA; staff duress alarm initiated and B pod sec called for security; Ethelda ChickJacubowitz, MD verbal order for 20mg  Geodon IM ASAP; RN overrode medications from pyxis; pt was assisted back to room by security and [employer]; RN politely asked to administer IM medication to patient; pt calmly undressed and accepted IM medication

## 2014-12-15 NOTE — ED Notes (Signed)
TTS in process 

## 2014-12-15 NOTE — BH Assessment (Addendum)
Tele Assessment Note   [name] is an 74 y.o. male, single, African-American who presents unaccompanied to [facility] ED reporting difficulty swallowing. [name] has a documented history of schizophrenia and appears actively psychotic and delusional. He is a poor historian, stating he cannot properly explain his problems to this LPC because "we aren't spiritually attuned." He reports not taking any psychiatric for an unknown period of time. He states that he was staying with his sister in [place] but "criminal and evil forces" are pursuing him and "I can't stay in any place too long." [place] and that "any little leaf falling" makes him fearful. He states he is unable to sleep and that "I have been on a thirty five day fast of bread and water." [name] appear religiously preoccupied. He says he needs to travel [place] to speak with Garnet Koyanagi about his problems. [name] denies current suicidal ideation or any history of suicide attempts. He denies homicidal ideation or history of violence. When asked about hallucinations [name] says that he recently "saw a man who was inside my leg" but denies current auditory or visual hallucinations. [name] denies any history of alcohol or substance abuse.  [name] identifies his medical concerns as his primary stressor. [name] states he has recently diagnosed with hepatitis C and he wants to be cured. He reports that police have stolen money from him and "trumped up charges" against him. He identifies his sister as his only support. He denies any current outpatient mental health providers. [name] reports he was at [facility] several months ago. [name] was last admitted to [facility] in May 2013 due to schizophrenia with paranoid and grandiose delusions.  [name] is dressed in hospital gown, alert and oriented to person, place and situation but not date. [name]'s speech is loud with occasional stutter. Motor behavior is within normal limits. Eye contact is  good. [name]'s mood is anxious and preoccupied; affect is congruent with mood. Thought process is circumstantial with grandiose an paranoid thought content and relevant. There is no indication [name] is currently responding to internal stimuli. [name] states he is willing to take psychiatric medication and is willing to be hospitalized in the future but not right now because he has important business to take care of.   Diagnosis: Schizophrenia  Past Medical History:  Past Medical History  Diagnosis Date  . Schizophrenia (HCC)   . Hepatitis C   . Benign hypertrophy of prostate   . Urinary tract infection     Past Surgical History  Procedure Laterality Date  . Knee surgery      right    Family History: No family history on file.  Social History:  reports that he quit smoking about 39 years ago. He does not have any smokeless tobacco history on file. He reports that he does not drink alcohol or use illicit drugs.  Additional Social History:  Alcohol / Drug Use Pain Medications: Denies abuse Prescriptions: None Over the Counter: Denies abuse History of alcohol / drug use?: No history of alcohol / drug abuse Longest period of sobriety (when/how long): NA  CIWA: CIWA-Ar BP: 125/75 mmHg Pulse Rate: 89 COWS:    PATIENT STRENGTHS: (choose at least two) Ability for insight Average or above average intelligence Capable of independent living Communication skills General fund of knowledge Motivation for treatment/growth  Allergies: No Known Allergies  Home Medications:  (Not in a hospital admission)  OB/GYN Status:  No LMP  for male patient.  General Assessment Data [facility] Assessment: In system Is this a Tele or Face-to-Face Assessment?: Tele Assessment Is this an Initial Assessment or a Re-assessment for this encounter?: Initial Assessment Marital status: Single Maiden name: NA Is patient pregnant?: No Pregnancy Status: No Living Arrangements: Other  (Comment) (Homeless) Can [name] return to current living arrangement?: Yes Admission Status: Voluntary Is patient capable of signing voluntary admission?: Yes Referral Source: Self/Family/Friend Insurance type: Medicare     Crisis Care Plan Living Arrangements: Other (Comment) (Homeless) Name of Psychiatrist: None Name of Therapist: None  Education Status Is patient currently in school?: No Current Grade: NA Highest grade of school patient has completed: NA Name of school: NA Contact person: NA  Risk to self with the past 6 months Suicidal Ideation: No Has patient been a risk to self within the past 6 months prior to admission? : No Suicidal Intent: No Has patient had any suicidal intent within the past 6 months prior to admission? : No Is patient at risk for suicide?: No Suicidal Plan?: No Has patient had any suicidal plan within the past 6 months prior to admission? : No Access to Means: No What has been your use of drugs/alcohol within the last 12 months?: [name] denies Previous Attempts/Gestures: No How many times?: 0 Other Self Harm Risks: None Triggers for Past Attempts: None known Intentional Self Injurious Behavior: None Family Suicide History: Unknown Recent stressful life event(s): Other (Comment) (Medical concerns) Persecutory voices/beliefs?: Yes Depression: Yes Depression Symptoms: Insomnia, Isolating, Fatigue, Loss of interest in usual pleasures Substance abuse history and/or treatment for substance abuse?: No Suicide prevention information given to non-admitted patients: Not applicable  Risk to Others within the past 6 months Homicidal Ideation: No Does patient have any lifetime risk of violence toward others beyond the six months prior to admission? : No Thoughts of Harm to Others: No Current Homicidal Intent: No Current Homicidal Plan: No Access to Homicidal Means: No Identified Victim: None History of harm to others?: No Assessment of Violence: None  Noted Violent Behavior Description: [name] denies history of violence Does patient have access to weapons?: No Criminal Charges Pending?: No (Unknown) Does patient have a court date: No (Unknown) Is patient on probation?: No  Psychosis Hallucinations: None noted Delusions: Persecutory, Grandiose (See assessment note)  Mental Status Report Appearance/Hygiene: Disheveled, In hospital gown Eye Contact: Good Motor Activity: Unremarkable Speech: Loud Level of Consciousness: Alert Mood: Anxious, Preoccupied Affect: Anxious, Preoccupied Anxiety Level: Moderate Thought Processes: Circumstantial, Tangential Judgement: Impaired Orientation: Person, Place, Situation Obsessive Compulsive Thoughts/Behaviors: Minimal  Cognitive Functioning Concentration: Decreased Memory: Recent Intact, Remote Impaired IQ: Average Insight: Poor Impulse Control: Fair Appetite: Poor Weight Loss: 0 (unknown) Weight Gain: 0 Sleep: Decreased Total Hours of Sleep: 2 Vegetative Symptoms: Decreased grooming  ADLScreening [facility] Assessment Services) Patient's cognitive ability adequate to safely complete daily activities?: Yes Patient able to express need for assistance with ADLs?: Yes Independently performs ADLs?: Yes (appropriate for developmental age)  Prior Inpatient Therapy Prior Inpatient Therapy: Yes Prior Therapy Dates: 2016, 2013 Prior Therapy [facility]Provider([facility] Reason for Treatment: Schizophrenia  Prior Outpatient Therapy Prior Outpatient Therapy: Yes Prior Therapy Dates: Unknown Prior Therapy Facilty/Provider(s): Unknown Reason for Treatment: Unknown Does patient have an ACCT team?: No Does patient have Intensive In-House Services?  : No Does patient have [facility] services? : No Does patient have P4CC services?: No  ADL Screening (condition at time of admission) Patient's cognitive ability adequate to  safely complete daily activities?: Yes Is the patient deaf  or have difficulty hearing?: No Does the patient have difficulty seeing, even when wearing glasses/contacts?: No Does the patient have difficulty concentrating, remembering, or making decisions?: No Patient able to express need for assistance with ADLs?: Yes Does the patient have difficulty dressing or bathing?: No Independently performs ADLs?: Yes (appropriate for developmental age) Does the patient have difficulty walking or climbing stairs?: No Weakness of Legs: None Weakness of Arms/Hands: None       Abuse/Neglect Assessment (Assessment to be complete while patient is alone) Physical Abuse: Denies Verbal Abuse: Denies Sexual Abuse: Denies Exploitation of patient/patient's resources: Denies Self-Neglect: Denies     Merchant navy officer (For Healthcare) Does patient have an advance directive?: No Would patient like information on creating an advanced directive?: No - patient declined information    Additional Information 1:1 In Past 12 Months?: No CIRT Risk: No Elopement Risk: No Does patient have medical clearance?: Yes     Disposition: Gave clinical information to Hulan Fess, NP who said [name] meets criteria for inpatient psychiatric treatment and recommends placement at facility that offers geriatric-psychiatry services. Notified Doug Sou, MD and Letha Cape, RN of recommendation. Dr. Ethelda Chick has placed [name] under IVC.  Disposition Initial Assessment Completed for this Encounter: Yes Disposition of Patient: Inpatient treatment program Type of inpatient treatment program: Adult   Pamalee Leyden, [facility], [facility], [facility] Triage Specialist [phone]   Pamalee Leyden 12/15/2014 8:38 PM

## 2014-12-15 NOTE — ED Notes (Signed)
Reports choking when drinking and eating x 1 month.  States he wants a medical diagnosis and a Rx.  NAD.

## 2014-12-15 NOTE — ED Notes (Signed)
Pt received Geodon willing, pt yelling at staff and security, pt became upset because ER physician stated he wanted to check hemoccult. Pt became angry and said that was degrading and no one was sticking their finger up his butt. Pt got dressed and tried to leave AMA. Pt placed in scrubs willingly after a lot of convincing and was told if he calmed down he would not be restrained.

## 2014-12-15 NOTE — ED Notes (Signed)
Pt was found.  

## 2014-12-15 NOTE — BH Assessment (Signed)
Received notification of TTS consult request. Attempted to Melton KrebsSamantha Nicole Riley, PA-C. Tele-assessment will be initiated.  Harlin RainFord Ellis Patsy BaltimoreWarrick Jr, LPC, [facility], [facility] Triage Specialist [phone]

## 2014-12-15 NOTE — ED Provider Notes (Signed)
CSN: [id number]     Arrival date & time 12/15/14  1709 History   First MD Initiated Contact with Patient 12/15/14 1914     Chief Complaint  Patient presents with  . Abdominal Pain   HPI   [name] is a 74 y.o. M PMH significant for schizophrenia (denies being on medication), hepatitis C, BPH presenting with multiple complaints. His primary reason for being here is nausea and "choking when swallowing" for several months. He complains of generalized abdominal pain for five years. He states he has not eaten in 5-6 days but refuses food. He is requesting to speak with Dr. Lora Havensrump about his hospital visit. He denies fevers, emesis, pain elsewhere, but I am unsure of how reliable this is.   Past Medical History  Diagnosis Date  . Schizophrenia (HCC)   . Hepatitis C   . Benign hypertrophy of prostate   . Urinary tract infection    Past Surgical History  Procedure Laterality Date  . Knee surgery      right   No family history on file. Social History  Substance Use Topics  . Smoking status: Former Smoker -- 1.00 packs/day for 20 years    Quit date: 06/11/1975  . Smokeless tobacco: None  . Alcohol Use: No    Review of Systems  Unable to perform ROS: Psychiatric disorder    Allergies  Review of patient's allergies indicates no known allergies.  Home Medications   Prior to Admission medications   Medication Sig Start Date End Date Taking? Authorizing Provider  ibuprofen (ADVIL,MOTRIN) 200 MG tablet Take 400 mg by mouth every 6 (six) hours as needed for mild pain.    Historical Provider, MD   BP 125/75 mmHg  Pulse 89  Temp(Src) 98.4 F (36.9 C) (Oral)  Resp 18  SpO2 100% Physical Exam  Constitutional: He appears well-developed and well-nourished. No distress.  HENT:  Head: Normocephalic and atraumatic.  Mouth/Throat: Oropharynx is clear and moist. No oropharyngeal exudate.  Eyes: Conjunctivae are normal. Pupils are equal, round, and reactive to light. Right eye exhibits  no discharge. Left eye exhibits no discharge. No scleral icterus.  Neck: No JVD present. No tracheal deviation present.  Cardiovascular: Normal rate, regular rhythm, normal heart sounds and intact distal pulses.  Exam reveals no gallop and no friction rub.   No murmur heard. Pulmonary/Chest: Effort normal and breath sounds normal. No respiratory distress. He has no wheezes. He has no rales. He exhibits no tenderness.  Abdominal: Soft. Bowel sounds are normal. He exhibits no distension and no mass. There is no tenderness. There is no rebound and no guarding.  No abdominal tenderness  Musculoskeletal: He exhibits no edema.  Neurological: He is alert.  Skin: Skin is warm and dry. No rash noted. He is not diaphoretic. No erythema.  Psychiatric:  Paranoid thoughts.   Nursing note and vitals reviewed.   ED Course  Procedures (including critical care time) Labs Review Labs Reviewed  CBC WITH DIFFERENTIAL/PLATELET - Abnormal; Notable for the following:    Hemoglobin 10.0 (*)    HCT 33.9 (*)    MCH 23.3 (*)    MCHC 29.5 (*)    RDW 15.9 (*)    All other components within normal limits  COMPREHENSIVE METABOLIC PANEL  ETHANOL  URINE RAPID DRUG SCREEN, HOSP PERFORMED  I-STAT TROPOININ, ED    Imaging Review Dg Chest Portable 1 View  12/15/2014  CLINICAL DATA:  Heartburn, acid build up or indigestion. Questionable aspiration. History of  hepatitis-C and schizophrenia. EXAM: PORTABLE CHEST 1 VIEW COMPARISON:  05/23/2013 and 05/18/2013. FINDINGS: 2002 hours. PICC line has been removed. The heart size and mediastinal contours are stable. There are low lung volumes with minimal basilar atelectasis. No airspace disease, edema or significant pleural effusion. Ossific density inferior to the right glenoid is stable, likely related to remote trauma. IMPRESSION: Minimal bibasilar atelectasis.  No evidence of aspiration ammonia. Electronically Signed   By: Carey Bullocks M.D.   On: 12/15/2014 20:14   I  have personally reviewed and evaluated these images and lab results as part of my medical decision-making.   EKG Interpretation None      MDM   Final diagnoses:  None   Dr. Ethelda Chick evaluated patient with me. Patient will be IVCd for acute psychosis. Will get basic labs and CXR to medically clear. If medically clear, will consult psych.    Melton Krebs, PA-C 12/15/14 2025  Doug Sou, MD 12/15/14 [phone]

## 2014-12-15 NOTE — ED Provider Notes (Addendum)
L5 caveat psychiatric patient. Patient states he's been choking on food and water for several months. He also reports that evil forces are coming after him and trying to harm him and he will expresses desire to go to [place] to be with Garnet Koyanagi. Patient has history of schizophrenia. On exam alert, mildly agitated lungs clear auscultation heart regular rate and rhythm abdomen nondistended nontender neurologic Glasgow Coma  cranial nerves II through XII intact moves all extremities well. Patient committed involuntarily by me for psychiatric evaluation as he is acutely psychotic. Chest xray viewed  By me. ED ECG REPORT   Date: 12/15/2014  Rate: 90  Rhythm: normal sinus rhythm  QRS Axis: normal  Intervals: normal  ST/T Wave abnormalities: nonspecific T wave changes  Conduction Disutrbances:none  Narrative Interpretation:   Old EKG Reviewed: unchanged No change from 05/18/2013 I have personally reviewed the EKG tracing and agree with the computerized printout as noted.- Results for orders placed or performed during the hospital encounter of 12/15/14  Comprehensive metabolic panel  Result Value Ref Range   Sodium 139 135 - 145 mmol/L   Potassium 4.4 3.5 - 5.1 mmol/L   Chloride 103 101 - 111 mmol/L   CO2 28 22 - 32 mmol/L   Glucose, Bld 106 (H) 65 - 99 mg/dL   BUN 7 6 - 20 mg/dL   Creatinine, Ser 4.09 (H) 0.61 - 1.24 mg/dL   Calcium 9.4 8.9 - 81.1 mg/dL   Total Protein 7.7 6.5 - 8.1 g/dL   Albumin 3.3 (L) 3.5 - 5.0 g/dL   AST 26 15 - 41 U/L   ALT 17 17 - 63 U/L   Alkaline Phosphatase 90 38 - 126 U/L   Total Bilirubin 1.0 0.3 - 1.2 mg/dL   GFR calc non Af Amer 56 (L) >60 mL/min   GFR calc Af Amer >60 >60 mL/min   Anion gap 8 5 - 15  Ethanol  Result Value Ref Range   Alcohol, Ethyl (B) <5 <5 mg/dL  CBC with Differential  Result Value Ref Range   WBC 8.0 4.0 - 10.5 K/uL   RBC 4.29 4.22 - 5.81 MIL/uL   Hemoglobin 10.0 (L) 13.0 - 17.0 g/dL   HCT 91.4 (L) 78.2 - 95.6 %   MCV 79.0 78.0 - 100.0 fL   MCH 23.3 (L) 26.0 - 34.0 pg   MCHC 29.5 (L) 30.0 - 36.0 g/dL   RDW 21.3 (H) 08.6 - 57.8 %   Platelets 294 150 - 400 K/uL   Neutrophils Relative % 72 %   Neutro Abs 5.7 1.7 - 7.7 K/uL   Lymphocytes Relative 19 %   Lymphs Abs 1.5 0.7 - 4.0 K/uL   Monocytes Relative 8 %   Monocytes Absolute 0.7 0.1 - 1.0 K/uL   Eosinophils Relative 1 %   Eosinophils Absolute 0.1 0.0 - 0.7 K/uL   Basophils Relative 0 %   Basophils Absolute 0.0 0.0 - 0.1 K/uL  I-Stat Troponin, ED (not at [facility])  Result Value Ref Range   Troponin i, poc 0.00 0.00 - 0.08 ng/mL   Comment 3           Dg Chest Portable 1 View  12/15/2014  CLINICAL DATA:  Heartburn, acid build up or indigestion. Questionable aspiration. History of hepatitis-C and schizophrenia. EXAM: PORTABLE CHEST 1 VIEW COMPARISON:  05/23/2013 and 05/18/2013. FINDINGS: 2002 hours. PICC line has been removed. The heart size and mediastinal contours are stable. There are low lung volumes with minimal  basilar atelectasis. No airspace disease, edema or significant pleural effusion. Ossific density inferior to the right glenoid is stable, likely related to remote trauma. IMPRESSION: Minimal bibasilar atelectasis.  No evidence of aspiration ammonia. Electronically Signed   By: Carey BullocksWilliam  Veazey M.D.   On: 12/15/2014 20:14  pt able to drink water in the ed without choking Pt is medically clear for psychiatric evaluation and has been evaluated by TTS and meets inpatient criteria for pschiatric hospitalization   Doug SouSam Everest Brod, MD 12/15/14 2058  At 9:10 PM patient became acutely agitated, yelling that he needed to go see Garnet Koyanagionald Trump not allowing staff to touch him.  He left the emergency department for 2 or 3 minutes and walk down the hallway. He was felt to be  Potential physical threat to staff and not of sound mind to leave the ED. Security retrieved the patient. And place him back into the room. He was medicated with Geodon 20mg  IM  9:53 PM  patient resting comfortably in bed stating "I'm okay" after treatment with Geodon  11:15 PM patient sleeping comfortably in bed CRITICAL CARE Performed by: Doug SouJACUBOWITZ,Vashawn Ekstein Total critical care time:   30 minutes Critical care time was exclusive of separately billable procedures and treating other patients. Critical care was necessary to treat or prevent imminent or life-threatening deterioration. Critical care was time spent personally by me on the following activities: development of treatment plan with patient and/or surrogate as well as nursing, discussions with consultants, evaluation of patient's response to treatment, examination of patient, obtaining history from patient or surrogate, ordering and performing treatments and interventions, ordering and review of laboratory studies, ordering and review of radiographic studies, pulse oximetry and re-evaluation of patient's condition.  Doug SouSam Myrah Strawderman, MD 12/15/14 2320

## 2014-12-15 NOTE — BHH Counselor (Signed)
12/15/14 [facility] to [facility], [place] [facility], [place], [address], [place], [place], [place]; [facility], [place], and [facility] K. Sherlon HandingHarris, LCAS-A, LPC-A, [facility]  Counselor 12/15/2014 11:25 PM

## 2014-12-15 NOTE — ED Notes (Signed)
Patient not wanded. Called security. Informed by Jac Canavanfficer Darby that he personally removed all clothing from patient.

## 2014-12-15 NOTE — ED Notes (Signed)
Pt received Geodon, changed into wine scrubs, pt has plastic bag in his had an says it is money and refuses to give to me to lock up with security. Awaiting pt to calm down or fall asleep to take from patient and place with other belongings.

## 2014-12-16 DIAGNOSIS — F250 Schizoaffective disorder, bipolar type: Secondary | ICD-10-CM

## 2014-12-16 LAB — RAPID URINE DRUG SCREEN, HOSP PERFORMED
AMPHETAMINES: NOT DETECTED
BARBITURATES: NOT DETECTED
Benzodiazepines: NOT DETECTED
Cocaine: NOT DETECTED
Opiates: NOT DETECTED
Tetrahydrocannabinol: NOT DETECTED

## 2014-12-16 NOTE — ED Notes (Signed)
[facility] Urine [facility] Screen. Informed him that patient has been sleeping soundly since receiving Geodon, and had not provided urine prior. Will request urine specimen from patient after he awakens. [facility] aware.

## 2014-12-16 NOTE — Progress Notes (Signed)
CSW received phone call from Admission Coordinator at [facility] regarding patient.  Patient has been accepted to facility and the number to call report is [phone]. Accepting MD is Dudley MajorAaron Brodsky. Patient is to be transported to facility after 2:00pm. CSW will contact unit RN and MD to inform.   Fernande BoydenJoyce Sydney Azure, LCSWA Clinical Social Worker [facility] Ph: [phone]

## 2014-12-16 NOTE — BHH Counselor (Signed)
12/16/14 via Darnelle MaffucciStacie declined at [facility] due to aggressive behaviors  Alysiana Ethridge K. Sherlon HandingHarris, LCAS-A, LPC-A, [place]  Counselor 12/16/2014 1:46 AM

## 2014-12-16 NOTE — Consult Note (Signed)
[facility] Consult   Reason for Consult:  Medication management for psychosis Referring Physician:  EDP Patient Identification: [name] MRN:  [id number] Principal Diagnosis: Schizoaffective disorder, bipolar type [facility]) Diagnosis:   Patient Active Problem List   Diagnosis Date Noted  . UGI bleed [K92.2] 02/06/2013  . Pancytopenia ([place]) [V25.366] 08/31/2012  . Microcytic anemia [D50.9] 05/02/2012  . GI bleed [K92.2] 05/02/2012  . Acute renal failure ([place]) [N17.9] 05/02/2012  . Schizoaffective disorder, bipolar type ([place]) [F25.0] 06/14/2011  . Hepatitis C [B19.20] 06/13/2011  . UTI (lower urinary tract infection) [N39.0] 06/13/2011    Class: Acute    Total Time spent with patient: 1 hour  Subjective:   [name] is a 74 y.o. male patient admitted with psychosis, grandiosity and bizarre symptoms.  HPI:  [name] is an 74 y.o. male, single, African-American seen face-to-face for psychiatric consultation and evaluation and reviewed available medical records and case discussed with psychiatrist service staff RN. Patient reportedly noncompliant with his medication management presented to the emergency department with the bizarre complaints of not able to drink or swallow food which is making choking. Patient stated he wanted to be diagnosed with biological problems and also chemical changes in his body and mind. Patient reported has been staying with his sister and has no other family members. Patient seems to be paranoid delusional, psychotic, bizarre somatic symptoms unable to care for himself and other family. Patient is also grandiose and says she wants to go to [place] and talk to Dr. Milinda Pointer. Patient denies current suicidal ideation or any history of suicide attempts. He denies homicidal ideation or history of violence. When asked about hallucinations Pt says that he recently "saw a man who was inside my leg" but denies current auditory or visual  hallucinations. Pt denies any history of alcohol or substance abuse. Patient may criteria for acute psychiatric hospitalization for crisis evaluation, medication management and safety monitoring. Social service reported patient has been accepted to [facility]. [facility] transfer him to [facility] for above care required   Past Psychiatric History: He has schizoaffective disorder and has previous acute psych admission at [facility] in 2013.   Risk to Self: Suicidal Ideation: No Suicidal Intent: No Is patient at risk for suicide?: No Suicidal Plan?: No Access to Means: No What has been your use of drugs/alcohol within the last 12 months?: Pt denies How many times?: 0 Other Self Harm Risks: None Triggers for Past Attempts: None known Intentional Self Injurious Behavior: None Risk to Others: Homicidal Ideation: No Thoughts of Harm to Others: No Current Homicidal Intent: No Current Homicidal Plan: No Access to Homicidal Means: No Identified Victim: None History of harm to others?: No Assessment of Violence: None Noted Violent Behavior Description: Pt denies history of violence Does patient have access to weapons?: No Criminal Charges Pending?: No (Unknown) Does patient have a court date: No (Unknown) Prior Inpatient Therapy: Prior Inpatient Therapy: Yes Prior Therapy Dates: 2016, 2013 Prior Therapy Facilty/Provider(s): [facility], [facility] Reason for Treatment: Schizophrenia Prior Outpatient Therapy: Prior Outpatient Therapy: Yes Prior Therapy Dates: Unknown Prior Therapy Facilty/Provider(s): Unknown Reason for Treatment: Unknown Does patient have an ACCT team?: No Does patient have Intensive In-House Services?  : No Does patient have [facility] services? : No Does patient have P4CC services?: No  Past Medical History:  Past Medical History  Diagnosis Date  . Schizophrenia ([place])   . Hepatitis C   . Benign hypertrophy of prostate   .  Urinary tract infection      Past Surgical History  Procedure Laterality Date  . Knee surgery      right   Family History: No family history on file. Family Psychiatric  History: Unknown Social History:  History  Alcohol Use No     History  Drug Use No    Social History   Social History  . Marital Status: Single    Spouse Name: N/A  . Number of Children: N/A  . Years of Education: N/A   Social History Main Topics  . Smoking status: Former Smoker -- 1.00 packs/day for 20 years    Quit date: 06/11/1975  . Smokeless tobacco: None  . Alcohol Use: No  . Drug Use: No  . Sexual Activity: Not Asked   Other Topics Concern  . None   Social History Narrative   Additional Social History:    Pain Medications: Denies abuse Prescriptions: None Over the Counter: Denies abuse History of alcohol / drug use?: No history of alcohol / drug abuse Longest period of sobriety (when/how long): NA                     Allergies:  No Known Allergies  Labs:  Results for orders placed or performed during the hospital encounter of 12/15/14 (from the past 48 hour(s))  Comprehensive metabolic panel     Status: Abnormal   Collection Time: 12/15/14  7:57 PM  Result Value Ref Range   Sodium 139 135 - 145 mmol/L   Potassium 4.4 3.5 - 5.1 mmol/L   Chloride 103 101 - 111 mmol/L   CO2 28 22 - 32 mmol/L   Glucose, Bld 106 (H) 65 - 99 mg/dL   BUN 7 6 - 20 mg/dL   Creatinine, Ser 1.25 (H) 0.61 - 1.24 mg/dL   Calcium 9.4 8.9 - 10.3 mg/dL   Total Protein 7.7 6.5 - 8.1 g/dL   Albumin 3.3 (L) 3.5 - 5.0 g/dL   AST 26 15 - 41 U/L   ALT 17 17 - 63 U/L   Alkaline Phosphatase 90 38 - 126 U/L   Total Bilirubin 1.0 0.3 - 1.2 mg/dL   GFR calc non Af Amer 56 (L) >60 mL/min   GFR calc Af Amer >60 >60 mL/min    Comment: (NOTE) The eGFR has been calculated using the CKD EPI equation. This calculation has not been validated in all clinical situations. eGFR's persistently <60 mL/min signify possible Chronic Kidney Disease.     Anion gap 8 5 - 15  Ethanol     Status: None   Collection Time: 12/15/14  7:57 PM  Result Value Ref Range   Alcohol, Ethyl (B) <5 <5 mg/dL    Comment:        LOWEST DETECTABLE LIMIT FOR SERUM ALCOHOL IS 5 mg/dL FOR MEDICAL PURPOSES ONLY   CBC with Differential     Status: Abnormal   Collection Time: 12/15/14  7:57 PM  Result Value Ref Range   WBC 8.0 4.0 - 10.5 K/uL   RBC 4.29 4.22 - 5.81 MIL/uL   Hemoglobin 10.0 (L) 13.0 - 17.0 g/dL   HCT 33.9 (L) 39.0 - 52.0 %   MCV 79.0 78.0 - 100.0 fL   MCH 23.3 (L) 26.0 - 34.0 pg   MCHC 29.5 (L) 30.0 - 36.0 g/dL   RDW 15.9 (H) 11.5 - 15.5 %   Platelets 294 150 - 400 K/uL   Neutrophils Relative % 72 %  Neutro Abs 5.7 1.7 - 7.7 K/uL   Lymphocytes Relative 19 %   Lymphs Abs 1.5 0.7 - 4.0 K/uL   Monocytes Relative 8 %   Monocytes Absolute 0.7 0.1 - 1.0 K/uL   Eosinophils Relative 1 %   Eosinophils Absolute 0.1 0.0 - 0.7 K/uL   Basophils Relative 0 %   Basophils Absolute 0.0 0.0 - 0.1 K/uL  I-Stat Troponin, ED (not at [facility])     Status: None   Collection Time: 12/15/14  8:12 PM  Result Value Ref Range   Troponin i, poc 0.00 0.00 - 0.08 ng/mL   Comment 3            Comment: Due to the release kinetics of cTnI, a negative result within the first hours of the onset of symptoms does not rule out myocardial infarction with certainty. If myocardial infarction is still suspected, repeat the test at appropriate intervals.   Urine rapid drug screen (hosp performed)     Status: None   Collection Time: 12/16/14  6:30 AM  Result Value Ref Range   Opiates NONE DETECTED NONE DETECTED   Cocaine NONE DETECTED NONE DETECTED   Benzodiazepines NONE DETECTED NONE DETECTED   Amphetamines NONE DETECTED NONE DETECTED   Tetrahydrocannabinol NONE DETECTED NONE DETECTED   Barbiturates NONE DETECTED NONE DETECTED    Comment:        DRUG SCREEN FOR MEDICAL PURPOSES ONLY.  IF CONFIRMATION IS NEEDED FOR ANY PURPOSE, NOTIFY LAB WITHIN 5 DAYS.         LOWEST DETECTABLE LIMITS FOR URINE DRUG SCREEN Drug Class       Cutoff (ng/mL) Amphetamine      1000 Barbiturate      200 Benzodiazepine   333 Tricyclics       545 Opiates          300 Cocaine          300 THC              50     No current facility-administered medications for this encounter.   Current Outpatient Prescriptions  Medication Sig Dispense Refill  . ibuprofen (ADVIL,MOTRIN) 200 MG tablet Take 400 mg by mouth every 6 (six) hours as needed for mild pain.      Musculoskeletal: Strength & Muscle Tone: within normal limits Gait & Station: normal Patient leans: N/A  Psychiatric Specialty Exam: ROS complaining about choking when eating or drinking and also has bizarre delusional thoughts, grandiosity and recent irritability and mild agitation.   Blood pressure 120/58, pulse 77, temperature 98.2 F (36.8 C), temperature source Oral, resp. rate 18, SpO2 98 %.There is no weight on file to calculate BMI.  General Appearance: Bizarre, Disheveled and Guarded  Eye Contact::  Good  Speech:  Pressured  Volume:  Increased  Mood:  Depressed and Irritable  Affect:  Inappropriate and Labile  Thought Process:  Disorganized, Irrelevant and Tangential  Orientation:  Full (Time, Place, and Person)  Thought Content:  WDL  Suicidal Thoughts:  No  Homicidal Thoughts:  No  Memory:  Immediate;   Fair Recent;   Fair  Judgement:  Impaired  Insight:  Lacking  Psychomotor Activity:  Increased  Concentration:  Fair  Recall:  [place] of Knowledge:Good  Language: Good  Akathisia:  Negative  Handed:  Right  AIMS (if indicated):     Assets:  Communication Skills Desire for Improvement Financial Resources/Insurance Housing Leisure Time [place] Talents/Skills Transportation  ADL's:  Intact  Cognition: WNL  Sleep:      Treatment Plan Summary: Daily contact with patient to assess and evaluate symptoms and progress in treatment and  Medication management  Continue safety sitter as patient has been psychotic delusions and bizarre and irritable behavior   Disposition: Reportedly patient is accepted [facility] and will provide appropriate transportation needs. Recommend psychiatric Inpatient admission when medically cleared. Supportive therapy provided about ongoing stressors.  Selby Slovacek,JANARDHAHA R. 12/16/2014 10:36 AM

## 2014-12-16 NOTE — ED Notes (Addendum)
Sitter relieved for break

## 2014-12-16 NOTE — BHH Counselor (Signed)
[place] called regarding referral requesting drug screening. This Clinical research associatewriter contacted Press photographerCharge nurse at [facility] Kendal HymenBonnie, RN who stated that pt. Had been refusing to take urine sample. Will try again when pt. Is awake. Malky Rudzinski K. Sherlon HandingHarris, LCAS-A, LPC-A, [facility]  Counselor 12/16/2014 6:11 AM

## 2019-07-31 ENCOUNTER — Emergency Department (HOSPITAL_COMMUNITY)
Admission: EM | Admit: 2019-07-31 | Discharge: 2019-08-01 | Disposition: A | Discharge status: Home / Self Care | Payer: Medicare Other | Attending: Emergency Medicine | Admitting: Emergency Medicine

## 2019-07-31 ENCOUNTER — Encounter (HOSPITAL_COMMUNITY): Payer: Self-pay

## 2019-07-31 DIAGNOSIS — M25561 Pain in right knee: Secondary | ICD-10-CM | POA: Insufficient documentation

## 2019-07-31 DIAGNOSIS — Z5321 Procedure and treatment not carried out due to patient leaving prior to being seen by health care provider: Secondary | ICD-10-CM | POA: Insufficient documentation

## 2019-07-31 NOTE — ED Notes (Signed)
No answer for VS x1 

## 2019-07-31 NOTE — ED Triage Notes (Signed)
Pt arrives to ED w/ c/o R knee pain. Pt somewhat manic in triage. Pt has hx of schizoaffective disorder. Pt states he has been taking his medications.

## 2019-08-01 NOTE — ED Notes (Signed)
Rn notified pt was yelling at staff and left

## 2019-10-26 ENCOUNTER — Emergency Department (HOSPITAL_COMMUNITY)
Admission: EM | Admit: 2019-10-26 | Discharge: 2019-10-26 | Disposition: A | Discharge status: Home / Self Care | Payer: Medicare Other | Attending: Emergency Medicine | Admitting: Emergency Medicine

## 2019-10-26 ENCOUNTER — Other Ambulatory Visit: Payer: Self-pay

## 2019-10-26 DIAGNOSIS — G8929 Other chronic pain: Secondary | ICD-10-CM | POA: Diagnosis not present

## 2019-10-26 DIAGNOSIS — Z5321 Procedure and treatment not carried out due to patient leaving prior to being seen by health care provider: Secondary | ICD-10-CM | POA: Diagnosis not present

## 2019-10-26 DIAGNOSIS — M79604 Pain in right leg: Secondary | ICD-10-CM | POA: Diagnosis not present

## 2019-10-26 NOTE — ED Notes (Signed)
Pt called to room .  Staff searched the waiting room pt unfound

## 2019-10-26 NOTE — ED Notes (Signed)
Pt didn't answer whe called for vitals

## 2019-10-26 NOTE — ED Triage Notes (Signed)
Right leg pain x 6 month that has gotten worse. Pt said chronic pain x 22 years from a previous surgery. Pt was found on side of road by police sleeping. Pt then told them he needed to come so ems brought him

## 2020-01-31 ENCOUNTER — Other Ambulatory Visit: Payer: Self-pay

## 2020-01-31 ENCOUNTER — Encounter (HOSPITAL_COMMUNITY): Payer: Self-pay | Admitting: Emergency Medicine

## 2020-01-31 ENCOUNTER — Emergency Department (HOSPITAL_COMMUNITY)
Admission: EM | Admit: 2020-01-31 | Discharge: 2020-02-01 | Disposition: A | Discharge status: Home / Self Care | Payer: Medicare Other | Attending: Emergency Medicine | Admitting: Emergency Medicine

## 2020-01-31 DIAGNOSIS — M25561 Pain in right knee: Secondary | ICD-10-CM | POA: Insufficient documentation

## 2020-01-31 DIAGNOSIS — Z5321 Procedure and treatment not carried out due to patient leaving prior to being seen by health care provider: Secondary | ICD-10-CM | POA: Diagnosis not present

## 2020-01-31 DIAGNOSIS — G8929 Other chronic pain: Secondary | ICD-10-CM | POA: Insufficient documentation

## 2020-01-31 NOTE — ED Triage Notes (Signed)
Patient reports chronic right knee pain for 2 years worse this week with mild swelling , ambulatory/afebrile .

## 2020-01-31 NOTE — ED Notes (Addendum)
Pt is very hard of hearing, and sleeping in waiting room.

## 2020-02-01 NOTE — ED Notes (Signed)
Pt ambulated to ready bed from waiting room. Pt began loudly talking about his missing clothes from the last time he was at a hospital and they were never returned. Pt with flight of ideas and talking in circles. Refused to sit in the bed for exam. Explained to the patient that if he sat down in the bed the doctor was ready to see him. Pt patient continued to talk about his missing clothes and refused to sit in the bed for exam. Pt stated he was ready to go and follow up with the [name]. Pt escorted back to waiting room. Dr. Clayborne Dana aware pt left AMA.

## 2020-02-01 NOTE — ED Provider Notes (Signed)
Apparently patient was in a bed to be seen and left prior to my evaluation. Reportedly he ambulated without difficulty.    [name], Adrian Cower, MD 02/01/20 [phone]

## 2020-03-03 ENCOUNTER — Emergency Department (HOSPITAL_COMMUNITY): Payer: Medicare Other

## 2020-03-03 ENCOUNTER — Emergency Department (HOSPITAL_COMMUNITY)
Admission: EM | Admit: 2020-03-03 | Discharge: 2020-03-03 | Disposition: A | Discharge status: Home / Self Care | Payer: Medicare Other | Attending: Emergency Medicine | Admitting: Emergency Medicine

## 2020-03-03 ENCOUNTER — Other Ambulatory Visit: Payer: Self-pay

## 2020-03-03 ENCOUNTER — Encounter (HOSPITAL_COMMUNITY): Payer: Self-pay

## 2020-03-03 DIAGNOSIS — Z79899 Other long term (current) drug therapy: Secondary | ICD-10-CM | POA: Diagnosis not present

## 2020-03-03 DIAGNOSIS — R300 Dysuria: Secondary | ICD-10-CM | POA: Diagnosis present

## 2020-03-03 DIAGNOSIS — N390 Urinary tract infection, site not specified: Secondary | ICD-10-CM | POA: Insufficient documentation

## 2020-03-03 DIAGNOSIS — G8929 Other chronic pain: Secondary | ICD-10-CM | POA: Diagnosis not present

## 2020-03-03 DIAGNOSIS — R0602 Shortness of breath: Secondary | ICD-10-CM | POA: Diagnosis not present

## 2020-03-03 DIAGNOSIS — M25561 Pain in right knee: Secondary | ICD-10-CM | POA: Diagnosis not present

## 2020-03-03 DIAGNOSIS — R42 Dizziness and giddiness: Secondary | ICD-10-CM | POA: Diagnosis not present

## 2020-03-03 DIAGNOSIS — R000 Tachycardia, unspecified: Secondary | ICD-10-CM | POA: Insufficient documentation

## 2020-03-03 DIAGNOSIS — R2242 Localized swelling, mass and lump, left lower limb: Secondary | ICD-10-CM | POA: Diagnosis not present

## 2020-03-03 DIAGNOSIS — I10 Essential (primary) hypertension: Secondary | ICD-10-CM | POA: Insufficient documentation

## 2020-03-03 DIAGNOSIS — Z87891 Personal history of nicotine dependence: Secondary | ICD-10-CM | POA: Insufficient documentation

## 2020-03-03 LAB — BRAIN NATRIURETIC PEPTIDE: B Natriuretic Peptide: 46.3 pg/mL (ref 0.0–100.0)

## 2020-03-03 LAB — URINALYSIS, ROUTINE W REFLEX MICROSCOPIC
Bilirubin Urine: NEGATIVE
Glucose, UA: NEGATIVE mg/dL
Ketones, ur: NEGATIVE mg/dL
Nitrite: POSITIVE — AB
Protein, ur: 30 mg/dL — AB
RBC / HPF: >50 RBC/hpf — ABNORMAL HIGH (ref 0–5)
Specific Gravity, Urine: 1.02 (ref 1.005–1.030)
WBC, UA: >50 WBC/hpf — ABNORMAL HIGH (ref 0–5)
pH: 5 (ref 5.0–8.0)

## 2020-03-03 LAB — COMPREHENSIVE METABOLIC PANEL
ALT: 36 U/L (ref 0–44)
AST: 39 U/L (ref 15–41)
Albumin: 4.3 g/dL (ref 3.5–5.0)
Alkaline Phosphatase: 76 U/L (ref 38–126)
Anion gap: 11 (ref 5–15)
BUN: 24 mg/dL — ABNORMAL HIGH (ref 8–23)
CO2: 24 mmol/L (ref 22–32)
Calcium: 9.8 mg/dL (ref 8.9–10.3)
Chloride: 102 mmol/L (ref 98–111)
Creatinine, Ser: 1.05 mg/dL (ref 0.61–1.24)
GFR, Estimated: >60 mL/min (ref >60)
Glucose, Bld: 104 mg/dL — ABNORMAL HIGH (ref 70–99)
Potassium: 4.1 mmol/L (ref 3.5–5.1)
Sodium: 137 mmol/L (ref 135–145)
Total Bilirubin: 1.3 mg/dL — ABNORMAL HIGH (ref 0.3–1.2)
Total Protein: 9.1 g/dL — ABNORMAL HIGH (ref 6.5–8.1)

## 2020-03-03 LAB — CBC WITH DIFFERENTIAL/PLATELET
Abs Immature Granulocytes: 0.02 10*3/uL (ref 0.00–0.07)
Basophils Absolute: 0.1 10*3/uL (ref 0.0–0.1)
Basophils Relative: 1 %
Eosinophils Absolute: 0 10*3/uL (ref 0.0–0.5)
Eosinophils Relative: 1 %
HCT: 52.5 % — ABNORMAL HIGH (ref 39.0–52.0)
Hemoglobin: 17.8 g/dL — ABNORMAL HIGH (ref 13.0–17.0)
Immature Granulocytes: 0 %
Lymphocytes Relative: 17 %
Lymphs Abs: 1.4 10*3/uL (ref 0.7–4.0)
MCH: 30 pg (ref 26.0–34.0)
MCHC: 33.9 g/dL (ref 30.0–36.0)
MCV: 88.5 fL (ref 80.0–100.0)
Monocytes Absolute: 0.6 10*3/uL (ref 0.1–1.0)
Monocytes Relative: 7 %
Neutro Abs: 6.2 10*3/uL (ref 1.7–7.7)
Neutrophils Relative %: 74 %
Platelets: 217 10*3/uL (ref 150–400)
RBC: 5.93 MIL/uL — ABNORMAL HIGH (ref 4.22–5.81)
RDW: 13.1 % (ref 11.5–15.5)
WBC: 8.3 10*3/uL (ref 4.0–10.5)
nRBC: 0 % (ref 0.0–0.2)

## 2020-03-03 MED ORDER — FOSFOMYCIN TROMETHAMINE 3 G PO PACK
3.0000 g | PACK | Freq: Once | ORAL | Status: AC
  Administered 2020-03-03: 3 g via ORAL
  Filled 2020-03-03: qty 3

## 2020-03-03 MED ORDER — HYDROCHLOROTHIAZIDE 12.5 MG PO CAPS
12.5000 mg | ORAL_CAPSULE | Freq: Once | ORAL | Status: AC
  Administered 2020-03-03: 12.5 mg via ORAL
  Filled 2020-03-03: qty 1

## 2020-03-03 MED ORDER — HYDROCHLOROTHIAZIDE 12.5 MG PO TABS: 12.5000 mg | ORAL_TABLET | Freq: Every day | ORAL | 1 refills | Status: DC

## 2020-03-03 MED ORDER — CEPHALEXIN 500 MG PO CAPS
500.0000 mg | ORAL_CAPSULE | Freq: Once | ORAL | Status: AC
  Administered 2020-03-03: 500 mg via ORAL
  Filled 2020-03-03: qty 1

## 2020-03-03 MED ORDER — SODIUM CHLORIDE 0.9 % IV BOLUS
1000.0000 mL | Freq: Once | INTRAVENOUS | Status: AC
  Administered 2020-03-03: 1000 mL via INTRAVENOUS

## 2020-03-03 MED ORDER — ACETAMINOPHEN 500 MG PO TABS
1000.0000 mg | ORAL_TABLET | Freq: Once | ORAL | Status: AC
  Administered 2020-03-03: 1000 mg via ORAL
  Filled 2020-03-03: qty 2

## 2020-03-03 NOTE — ED Notes (Signed)
Pt bladder scanned, scan reveals 130 ml

## 2020-03-03 NOTE — Discharge Instructions (Signed)
Your blood pressure is elevated today.  You need to have this rechecked by primary care physician.  You are being prescribed high blood pressure medicine today.  You were given an antibiotic for your urinary tract infection today.  If you have no improvement or develop new or worsening bladder symptoms you need to return to the hospital or see your family doctor.  If at any point you develop fever, headache, weakness, chest pain, or any other new/concerning symptoms then call 911 or return to the ER.

## 2020-03-03 NOTE — ED Triage Notes (Addendum)
Per EMS- patient is homeless and was sitting on the sidewalk when they arrived. Patient c/o right knee and right leg pain. Swelling noted to the right knee.  Patient added that he is unable to stand on his right knee and bilateral feet are swollen.

## 2020-03-03 NOTE — ED Provider Notes (Signed)
[facility]-EMERGENCY DEPT Provider Note   CSN: [id number] Arrival date & time: 03/03/20  1710     History Chief Complaint  Patient presents with  . Leg Pain  . Knee Pain  . Foot Pain    [name] is a 80 y.o. male.  HPI 80 year old male presents with multiple complaints.  He is indicating he has had all worsening in his right knee which has chronically been a problem and has been swollen for months.  Used to be swollen for much worse but it is still bad enough that it is hard for him to walk.  Bilateral feet have been cold and swollen as he is homeless and out in the elements.  He also endorses months of dizziness.  No headache, chest pain, shortness of breath.   Past Medical History:  Diagnosis Date  . Benign hypertrophy of prostate   . Hepatitis C   . Schizophrenia ([facility])   . Urinary tract infection     Patient Active Problem List   Diagnosis Date Noted  . UGI bleed 02/06/2013  . Pancytopenia ([facility]) 08/31/2012  . Microcytic anemia 05/02/2012  . GI bleed 05/02/2012  . Acute renal failure ([facility]) 05/02/2012  . Schizoaffective disorder, bipolar type ([facility]) 06/14/2011  . Hepatitis C 06/13/2011  . UTI (lower urinary tract infection) 06/13/2011    Class: Acute    Past Surgical History:  Procedure Laterality Date  . KNEE SURGERY     right       No family history on file.  Social History   Tobacco Use  . Smoking status: Former Smoker    Packs/day: 1.00    Years: 20.00    Pack years: 20.00    Quit date: 06/11/1975    Years since quitting: 44.7  . Smokeless tobacco: Never Used  Vaping Use  . Vaping Use: Never used  Substance Use Topics  . Alcohol use: No  . Drug use: No    Home Medications Prior to Admission medications   Medication Sig Start Date End Date Taking? Authorizing Provider  hydrochlorothiazide (HYDRODIURIL) 12.5 MG tablet Take 1 tablet (12.5 mg total) by mouth daily. 03/03/20  Yes Pricilla Loveless, MD  ibuprofen  (ADVIL,MOTRIN) 200 MG tablet Take 400 mg by mouth every 6 (six) hours as needed for mild pain.    [provider]    Allergies    Patient has no known allergies.  Review of Systems   Review of Systems  Constitutional: Negative for fever.  Respiratory: Negative for shortness of breath.   Cardiovascular: Positive for leg swelling. Negative for chest pain.  Genitourinary:       Chronic difficulty controlling his urine  Musculoskeletal: Positive for arthralgias and joint swelling.  Neurological: Positive for dizziness. Negative for headaches.  All other systems reviewed and are negative.   Physical Exam Updated Vital Signs BP (!) 183/92   Pulse 98   Temp 98.5 F (36.9 C) (Oral)   Resp 20   Ht 5\' 8"  (1.727 m)   Wt 74.8 kg   SpO2 100%   BMI 25.09 kg/m   Physical Exam Vitals and nursing note reviewed.  Constitutional:      Appearance: He is well-developed and well-nourished.  HENT:     Head: Normocephalic and atraumatic.     Right Ear: External ear normal.     Left Ear: External ear normal.     Nose: Nose normal.  Eyes:     General:  Right eye: No discharge.        Left eye: No discharge.     Pupils: Pupils are equal, round, and reactive to light.  Cardiovascular:     Rate and Rhythm: Regular rhythm. Tachycardia present.     Pulses:          Dorsalis pedis pulses are 2+ on the right side and 2+ on the left side.     Heart sounds: Normal heart sounds.  Pulmonary:     Effort: Pulmonary effort is normal.     Breath sounds: Normal breath sounds.  Abdominal:     Palpations: Abdomen is soft.     Tenderness: There is no abdominal tenderness.  Musculoskeletal:        General: No edema.     Cervical back: Neck supple.     Comments: Right knee with some mild swelling. Nearly full flexion/extension. No excess heat or erythema Bilateral feet are swollen, R>L  Skin:    General: Skin is warm and dry.  Neurological:     Mental Status: He is alert.      Comments: CN 3-12 grossly intact. 5/5 strength in all 4 extremities, though limited in right leg due to pain   Psychiatric:        Mood and Affect: Mood is not anxious.     ED Results / Procedures / Treatments   Labs (all labs ordered are listed, but only abnormal results are displayed) Labs Reviewed  COMPREHENSIVE METABOLIC PANEL - Abnormal; Notable for the following components:      Result Value   Glucose, Bld 104 (*)    BUN 24 (*)    Total Protein 9.1 (*)    Total Bilirubin 1.3 (*)    All other components within normal limits  CBC WITH DIFFERENTIAL/PLATELET - Abnormal; Notable for the following components:   RBC 5.93 (*)    Hemoglobin 17.8 (*)    HCT 52.5 (*)    All other components within normal limits  URINALYSIS, ROUTINE W REFLEX MICROSCOPIC - Abnormal; Notable for the following components:   APPearance CLOUDY (*)    Hgb urine dipstick LARGE (*)    Protein, ur 30 (*)    Nitrite POSITIVE (*)    Leukocytes,Ua LARGE (*)    RBC / HPF >50 (*)    WBC, UA >50 (*)    Bacteria, UA MANY (*)    All other components within normal limits  URINE CULTURE  BRAIN NATRIURETIC PEPTIDE    EKG EKG Interpretation  Date/Time:  Monday March 03 2020 19:37:56 EST Ventricular Rate:  117 PR Interval:    QRS Duration: 74 QT Interval:  303 QTC Calculation: 423 R Axis:   -23 Text Interpretation: Sinus tachycardia Probable left atrial enlargement Left ventricular hypertrophy 12 Lead; [place] Confirmed by Pricilla Loveless [phone]) on 03/03/2020 7:47:42 PM   Radiology CT Head Wo Contrast  Result Date: 03/03/2020 CLINICAL DATA:  Vertigo EXAM: CT HEAD WITHOUT CONTRAST TECHNIQUE: Contiguous axial images were obtained from the base of the skull through the vertex without intravenous contrast. COMPARISON:  None. FINDINGS: Brain: There is atrophy and chronic small vessel disease changes. No acute intracranial abnormality. Specifically, no hemorrhage, hydrocephalus, mass lesion, acute infarction,  or significant intracranial injury. Vascular: No hyperdense vessel or unexpected calcification. Skull: No acute calvarial abnormality. Sinuses/Orbits: No acute findings Other: None IMPRESSION: Atrophy, chronic microvascular disease. No acute intracranial abnormality. Electronically Signed   By: Charlett Nose M.D.   On: 03/03/2020 19:31   DG  Knee Complete 4 Views Right  Result Date: 03/03/2020 CLINICAL DATA:  Right knee and leg pain. EXAM: RIGHT KNEE - COMPLETE 4+ VIEW COMPARISON:  Right knee radiograph May 26, 2013 FINDINGS: Interval increase in the tricompartment degenerative change which is worse in the medial and patellofemoral compartments where it is severe. Small suprapatellar joint effusion. Chondrocalcinosis in the lateral joint compartment. IMPRESSION: Interval increase in the severe tricompartment degenerative change, worse in the medial and patellofemoral compartments. Electronically Signed   By: Maudry Mayhew MD   On: 03/03/2020 18:11    Procedures Procedures   Medications Ordered in ED Medications  acetaminophen (TYLENOL) tablet 1,000 mg (1,000 mg Oral Given 03/03/20 2018)  sodium chloride 0.9 % bolus 1,000 mL (0 mLs Intravenous Stopped 03/03/20 2229)  cephALEXin (KEFLEX) capsule 500 mg (500 mg Oral Given 03/03/20 2133)  fosfomycin (MONUROL) packet 3 g (3 g Oral Given by Other 03/03/20 2230)  hydrochlorothiazide (MICROZIDE) capsule 12.5 mg (12.5 mg Oral Given by Other 03/03/20 2230)    ED Course  I have reviewed the triage vital signs and the nursing notes.  Pertinent labs & imaging results that were available during my care of the patient were reviewed by me and considered in my medical decision making (see chart for details).    MDM Rules/Calculators/A&P                          Given patient's dizziness, CT head was obtained but shows no acute pathology.  Given the months worth of symptoms I think this is unlikely to be acute CNS pathology.  His labs are overall reassuring besides  mild BUN elevation which might be from some dehydration and so he was given some fluids.  WBC is normal.  Elevated hemoglobin goes along with some dehydration.  No evidence of CHF.  No dyspnea/chest pain.  His right knee is a little bit swollen but not consistent with septic joint to have very low suspicion of such.  I did offer to try and drain what ever fluid would be in it but he declines.  Otherwise, a lot of his symptoms seem to be chronic.  He is hypertensive and I will prescribe antihypertensives for him.  He is noted to have a UTI which he states he does not have symptoms of though does have trouble voiding at times.  There is no obvious urinary retention on bladder scan.  At first I had considered Keflex but I am a little worried of medication compliance so I will give him fosfomycin.  He does not have any signs/symptoms of pyelonephritis.  Will discharge. Final Clinical Impression(s) / ED Diagnoses Final diagnoses:  Acute urinary tract infection  Chronic pain of right knee  Hypertension, unspecified type    Rx / DC Orders ED Discharge Orders         Ordered    hydrochlorothiazide (HYDRODIURIL) 12.5 MG tablet  Daily        03/03/20 2211           Pricilla Loveless, MD 03/03/20 2253

## 2020-03-03 NOTE — ED Notes (Signed)
Patient being d/c'd and is homeless. Called [facility] to transport him to [facility] at [employer] [facility].

## 2020-03-05 LAB — URINE CULTURE

## 2020-03-13 ENCOUNTER — Inpatient Hospital Stay: Payer: Medicare Other | Admitting: Physician Assistant

## 2020-06-13 ENCOUNTER — Other Ambulatory Visit: Payer: Self-pay

## 2020-06-13 ENCOUNTER — Emergency Department (HOSPITAL_COMMUNITY)
Admission: EM | Admit: 2020-06-13 | Discharge: 2020-06-13 | Disposition: A | Discharge status: Home / Self Care | Payer: Medicare Other | Attending: Emergency Medicine | Admitting: Emergency Medicine

## 2020-06-13 ENCOUNTER — Emergency Department (HOSPITAL_COMMUNITY): Payer: Medicare Other

## 2020-06-13 ENCOUNTER — Encounter (HOSPITAL_COMMUNITY): Payer: Self-pay | Admitting: *Deleted

## 2020-06-13 DIAGNOSIS — R1110 Vomiting, unspecified: Secondary | ICD-10-CM | POA: Diagnosis not present

## 2020-06-13 DIAGNOSIS — T675XXA Heat exhaustion, unspecified, initial encounter: Secondary | ICD-10-CM | POA: Insufficient documentation

## 2020-06-13 DIAGNOSIS — Z5321 Procedure and treatment not carried out due to patient leaving prior to being seen by health care provider: Secondary | ICD-10-CM | POA: Diagnosis not present

## 2020-06-13 LAB — CBC WITH DIFFERENTIAL/PLATELET
Abs Immature Granulocytes: 0.04 10*3/uL (ref 0.00–0.07)
Basophils Absolute: 0.1 10*3/uL (ref 0.0–0.1)
Basophils Relative: 1 %
Eosinophils Absolute: 0.1 10*3/uL (ref 0.0–0.5)
Eosinophils Relative: 1 %
HCT: 45.2 % (ref 39.0–52.0)
Hemoglobin: 15.1 g/dL (ref 13.0–17.0)
Immature Granulocytes: 1 %
Lymphocytes Relative: 13 %
Lymphs Abs: 1.1 10*3/uL (ref 0.7–4.0)
MCH: 30 pg (ref 26.0–34.0)
MCHC: 33.4 g/dL (ref 30.0–36.0)
MCV: 89.7 fL (ref 80.0–100.0)
Monocytes Absolute: 0.8 10*3/uL (ref 0.1–1.0)
Monocytes Relative: 10 %
Neutro Abs: 6.2 10*3/uL (ref 1.7–7.7)
Neutrophils Relative %: 74 %
Platelets: 220 10*3/uL (ref 150–400)
RBC: 5.04 MIL/uL (ref 4.22–5.81)
RDW: 13.5 % (ref 11.5–15.5)
WBC: 8.3 10*3/uL (ref 4.0–10.5)
nRBC: 0 % (ref 0.0–0.2)

## 2020-06-13 LAB — COMPREHENSIVE METABOLIC PANEL
ALT: 21 U/L (ref 0–44)
AST: 27 U/L (ref 15–41)
Albumin: 3.7 g/dL (ref 3.5–5.0)
Alkaline Phosphatase: 79 U/L (ref 38–126)
Anion gap: 11 (ref 5–15)
BUN: 32 mg/dL — ABNORMAL HIGH (ref 8–23)
CO2: 20 mmol/L — ABNORMAL LOW (ref 22–32)
Calcium: 9.3 mg/dL (ref 8.9–10.3)
Chloride: 105 mmol/L (ref 98–111)
Creatinine, Ser: 1.86 mg/dL — ABNORMAL HIGH (ref 0.61–1.24)
GFR, Estimated: 37 mL/min — ABNORMAL LOW (ref >60)
Glucose, Bld: 97 mg/dL (ref 70–99)
Potassium: 4.1 mmol/L (ref 3.5–5.1)
Sodium: 136 mmol/L (ref 135–145)
Total Bilirubin: 3.2 mg/dL — ABNORMAL HIGH (ref 0.3–1.2)
Total Protein: 8 g/dL (ref 6.5–8.1)

## 2020-06-13 LAB — CBG MONITORING, ED: Glucose-Capillary: 100 mg/dL — ABNORMAL HIGH (ref 70–99)

## 2020-06-13 LAB — LACTIC ACID, PLASMA: Lactic Acid, Venous: 1.3 mmol/L (ref 0.5–1.9)

## 2020-06-13 NOTE — ED Provider Notes (Signed)
Emergency Medicine Provider Triage Evaluation Note  [name] , a 80 y.o. male  was evaluated in triage.  Pt complains of brought in by EMS with vomiting, heat exhaustion, seen by a bystander slumped over with several layers of clothing on, Temp 101, stripped down by EMS, given IV fluids and cool rags with decrease in temp to 97..  Review of Systems  Positive: No complaints Negative: CP, weakness, abdominal pain  Physical Exam  There were no vitals taken for this visit. Gen:   Awake, no distress   Resp:  Normal effort  MSK:   Moves extremities without difficulty  Other:    Medical Decision Making  Medically screening exam initiated at 1:51 PM.  Appropriate orders placed.  [name] was informed that the remainder of the evaluation will be completed by another provider, this initial triage assessment does not replace that evaluation, and the importance of remaining in the ED until their evaluation is complete.     Jeannie Fend, PA-C 06/13/20 1353    Blane Ohara, MD 06/13/20 [phone]

## 2020-06-13 NOTE — ED Triage Notes (Signed)
Bystander found pt sitting on a bucket when she passed him in the am and then at noon.  He hada coat, sweatshirt and a vest on.  Ice packs were placed under armpits, bottle water was drunk and cold compresses on head.   ao x 4\  128/83 Hr 110 rr 40 ra 97%  temp changed from 101 to 97

## 2020-06-13 NOTE — ED Notes (Signed)
Pt continue refusing to be seen by doctor stating "I don't want to see a doctor, they brought me here. I told them I was ok. What did they brought me here for?" The triage nurse, [employer] and security is out front trying to calm the pt down. Pt continue to yell. Pt is now packing up his weights into his book bags and is being escorted out.

## 2020-06-13 NOTE — ED Notes (Signed)
Pt upset yelling he want the rest of his belongings so that he can leave. Pt is requesting a bike that he was on. Informed pt he was brought in by EMS and his bike is where ever they picked him up from. Pt was given some belongs that was sitting in triage. Pt is still upset and yelling about the over pass and heaven and hell.

## 2021-01-08 ENCOUNTER — Emergency Department (HOSPITAL_COMMUNITY)
Admission: EM | Admit: 2021-01-08 | Discharge: 2021-01-09 | Disposition: A | Discharge status: Home / Self Care | Payer: No Typology Code available for payment source | Attending: Emergency Medicine | Admitting: Emergency Medicine

## 2021-01-08 ENCOUNTER — Other Ambulatory Visit: Payer: Self-pay

## 2021-01-08 ENCOUNTER — Encounter (HOSPITAL_COMMUNITY): Payer: Self-pay

## 2021-01-08 DIAGNOSIS — Z87891 Personal history of nicotine dependence: Secondary | ICD-10-CM | POA: Diagnosis not present

## 2021-01-08 DIAGNOSIS — G8929 Other chronic pain: Secondary | ICD-10-CM

## 2021-01-08 DIAGNOSIS — M25561 Pain in right knee: Secondary | ICD-10-CM | POA: Insufficient documentation

## 2021-01-08 MED ORDER — NAPROXEN 375 MG PO TABS: 375.0000 mg | ORAL_TABLET | Freq: Two times a day (BID) | ORAL | 0 refills | Status: DC

## 2021-01-08 NOTE — ED Provider Notes (Signed)
[facility] EMERGENCY DEPARTMENT Provider Note   CSN: [id number] Arrival date & time: 01/08/21  1745     History Chief Complaint  Patient presents with   Knee Pain    [name] is a 80 y.o. male.  Patient to eD c/o right knee pain x 6 months. He reports being unable to ride his bike as he normally does secondary to knee pain. He states "I need a prescription for the knee pain". He has tried taking OTC medications without relief.   The history is provided by the patient. No language interpreter was used.  Knee Pain     Past Medical History:  Diagnosis Date   Benign hypertrophy of prostate    Hepatitis C    Schizophrenia (HCC)    Urinary tract infection     Patient Active Problem List   Diagnosis Date Noted   UGI bleed 02/06/2013   Pancytopenia (HCC) 08/31/2012   Microcytic anemia 05/02/2012   GI bleed 05/02/2012   Acute renal failure (HCC) 05/02/2012   Schizoaffective disorder, bipolar type (HCC) 06/14/2011   Hepatitis C 06/13/2011   UTI (lower urinary tract infection) 06/13/2011    Class: Acute    Past Surgical History:  Procedure Laterality Date   KNEE SURGERY     right       History reviewed. No pertinent family history.  Social History   Tobacco Use   Smoking status: Former    Packs/day: 1.00    Years: 20.00    Pack years: 20.00    Types: Cigarettes    Quit date: 06/11/1975    Years since quitting: 45.6   Smokeless tobacco: Never  Vaping Use   Vaping Use: Never used  Substance Use Topics   Alcohol use: No   Drug use: No    Home Medications Prior to Admission medications   Medication Sig Start Date End Date Taking? Authorizing Provider  hydrochlorothiazide (HYDRODIURIL) 12.5 MG tablet Take 1 tablet (12.5 mg total) by mouth daily. 03/03/20   Pricilla Loveless, MD  ibuprofen (ADVIL,MOTRIN) 200 MG tablet Take 400 mg by mouth every 6 (six) hours as needed for mild pain.    [provider]    Allergies    Patient  has no known allergies.  Review of Systems   Review of Systems  Constitutional:  Positive for activity change. Negative for chills.  Musculoskeletal:        See HPI  Skin: Negative.  Negative for color change.  Neurological: Negative.  Negative for numbness.   Physical Exam Updated Vital Signs BP 132/75 (BP Location: Right Arm)    Pulse 85    Temp 98.2 F (36.8 C) (Oral)    Resp (!) 22    Ht 5\' 8"  (1.727 m)    Wt 75 kg    SpO2 97%    BMI 25.14 kg/m   Physical Exam Vitals and nursing note reviewed.  Constitutional:      Appearance: He is well-developed.  Pulmonary:     Effort: Pulmonary effort is normal.  Musculoskeletal:        General: Normal range of motion.     Cervical back: Normal range of motion.     Comments: No swelling of the right knee. Joint stable. No calf or thigh pain. Distal pulses intact.   Skin:    General: Skin is warm and dry.  Neurological:     Mental Status: He is alert and oriented to person, place, and time.  ED Results / Procedures / Treatments   Labs (all labs ordered are listed, but only abnormal results are displayed) Labs Reviewed - No data to display  EKG None  Radiology No results found.  Procedures Procedures   Medications Ordered in ED Medications - No data to display  ED Course  I have reviewed the triage vital signs and the nursing notes.  Pertinent labs & imaging results that were available during my care of the patient were reviewed by me and considered in my medical decision making (see chart for details).    MDM Rules/Calculators/A&P                         Patient to ED with knee pain x 6 months.   Exam reassuring. No evidence septic joint or other infection. No swelling. Joint without laxity. No vascular compromise.   Will Rx Naproxen and provide ortho follow up.      Final Clinical Impression(s) / ED Diagnoses Final diagnoses:  None   Right knee pain  Rx / DC Orders ED Discharge Orders     None         Danne Harbor 01/08/21 2345    Tilden Fossa, MD 01/09/21 [phone]

## 2021-01-08 NOTE — ED Triage Notes (Signed)
Pt arrives POV for eval of R knee pain. Pt is belligerent and yelling in triage. Arrives w/ boxing gloves on both hands "Just so we know whats up" [employer] required to get pt to remove gloves. Pt screaming in triage about receiving a prescription for the knee pain

## 2021-01-08 NOTE — Discharge Instructions (Signed)
Take Naproxen as directed (twice daily) for pain of the right knee.

## 2021-02-18 ENCOUNTER — Other Ambulatory Visit: Payer: Self-pay

## 2021-02-18 ENCOUNTER — Encounter (HOSPITAL_COMMUNITY): Payer: Self-pay

## 2021-02-18 ENCOUNTER — Emergency Department (HOSPITAL_COMMUNITY)
Admission: EM | Admit: 2021-02-18 | Discharge: 2021-02-19 | Disposition: A | Discharge status: Home / Self Care | Payer: No Typology Code available for payment source | Attending: Emergency Medicine | Admitting: Emergency Medicine

## 2021-02-18 DIAGNOSIS — Z5900 Homelessness unspecified: Secondary | ICD-10-CM | POA: Diagnosis not present

## 2021-02-18 DIAGNOSIS — R443 Hallucinations, unspecified: Secondary | ICD-10-CM | POA: Insufficient documentation

## 2021-02-18 DIAGNOSIS — Z5321 Procedure and treatment not carried out due to patient leaving prior to being seen by health care provider: Secondary | ICD-10-CM | POA: Insufficient documentation

## 2021-02-18 NOTE — ED Notes (Signed)
Pt does not respond when called for vitals check X4

## 2021-02-18 NOTE — ED Triage Notes (Signed)
Pt arrived via POV. Pt is a "homeless veteran." Pt is having hallucinations. Pt states he has a haunted house in his body and the church people come into his body and take his personal belongings debit card, etc. Pt states a woman took her ID off of him and  they blocked  his veterans card. Pt is very hard of hearing.

## 2021-02-18 NOTE — ED Provider Triage Note (Signed)
Emergency Medicine Provider Triage Evaluation Note  [name] , a 81 y.o. male  was evaluated in triage.  Pt complains of needing a ride back to the [facility]. patient denies being in any pain.  Patient rambles when the provider attempted to interview him.  Patient does include that he is not in any pain, has no medical complaint.  It seems this patient is here for TTS consult.  I have reached out to case management who states that he needs to be cleared by behavioral health prior to their assistance.  TTS consult was been placed.  Review of Systems  Positive: N/A Negative: N/A  Physical Exam  BP (!) 142/81 (BP Location: Right Arm)    Pulse 94    Temp 98.4 F (36.9 C) (Oral)    Resp 18    Ht 5\' 8"  (1.727 m)    Wt 75 kg    SpO2 98%    BMI 25.14 kg/m  Gen:   Awake, no distress   Resp:  Normal effort  MSK:   Moves extremities without difficulty  Other:  Patient continuously rambles, talks about money, talks about needing a ride back to [facility] when I attempt to interview this patient  Medical Decision Making  Medically screening exam initiated at 4:27 PM.  Appropriate orders placed.  [name] was informed that the remainder of the evaluation will be completed by another provider, this initial triage assessment does not replace that evaluation, and the importance of remaining in the ED until their evaluation is complete.     Baldwin Crown, PA-C 02/18/21 1634

## 2021-02-18 NOTE — BH Assessment (Signed)
Clinician began to review pt's chart in preparation to complete pt's MH Assessment; however, pt is still in the lobby at this time and, thus, unable to be seen. TTS to attempt assessment at a later time and once pt is roomed.

## 2021-02-18 NOTE — ED Notes (Signed)
Called for vitals x3 °

## 2021-02-19 NOTE — ED Notes (Signed)
Pt has not returned from outside of lobby, called for vitals with no response

## 2021-02-19 NOTE — BH Assessment (Signed)
Clinician began to review pt's chart to determine if he had been moved to a room and, thus, his MH Assessment could be completed. At this time pt remains in the lobby and, thus, cannot be assessed. TTS to attempt at a later time after pt has been roomed.

## 2021-02-19 NOTE — BH Assessment (Signed)
@   [id number] TTS sent secure chat to NT to check on pt status.  No response from NT and no RN assigned to pt at this time. Please contact TTS when pt is medically cleared and ready for assessment.

## 2021-02-19 NOTE — ED Notes (Signed)
Pt seen taking all belongings and walking out of lobby, will wait to see if Pt returns

## 2021-07-14 ENCOUNTER — Emergency Department (HOSPITAL_COMMUNITY)
Admission: EM | Admit: 2021-07-14 | Discharge: 2021-07-15 | Disposition: A | Discharge status: Home / Self Care | Payer: No Typology Code available for payment source | Attending: Emergency Medicine | Admitting: Emergency Medicine

## 2021-07-14 ENCOUNTER — Other Ambulatory Visit: Payer: Self-pay

## 2021-07-14 DIAGNOSIS — R109 Unspecified abdominal pain: Secondary | ICD-10-CM | POA: Insufficient documentation

## 2021-07-14 DIAGNOSIS — Z5321 Procedure and treatment not carried out due to patient leaving prior to being seen by health care provider: Secondary | ICD-10-CM | POA: Diagnosis not present

## 2021-07-14 LAB — CBC
HCT: 36.2 % — ABNORMAL LOW (ref 39.0–52.0)
Hemoglobin: 12.1 g/dL — ABNORMAL LOW (ref 13.0–17.0)
MCH: 30.3 pg (ref 26.0–34.0)
MCHC: 33.4 g/dL (ref 30.0–36.0)
MCV: 90.5 fL (ref 80.0–100.0)
Platelets: 207 10*3/uL (ref 150–400)
RBC: 4 MIL/uL — ABNORMAL LOW (ref 4.22–5.81)
RDW: 13.2 % (ref 11.5–15.5)
WBC: 6.4 10*3/uL (ref 4.0–10.5)
nRBC: 0 % (ref 0.0–0.2)

## 2021-07-14 LAB — COMPREHENSIVE METABOLIC PANEL
ALT: 16 U/L (ref 0–44)
AST: 21 U/L (ref 15–41)
Albumin: 3.6 g/dL (ref 3.5–5.0)
Alkaline Phosphatase: 68 U/L (ref 38–126)
Anion gap: 7 (ref 5–15)
BUN: 12 mg/dL (ref 8–23)
CO2: 26 mmol/L (ref 22–32)
Calcium: 9.4 mg/dL (ref 8.9–10.3)
Chloride: 107 mmol/L (ref 98–111)
Creatinine, Ser: 1.3 mg/dL — ABNORMAL HIGH (ref 0.61–1.24)
GFR, Estimated: 56 mL/min — ABNORMAL LOW (ref >60)
Glucose, Bld: 80 mg/dL (ref 70–99)
Potassium: 3.7 mmol/L (ref 3.5–5.1)
Sodium: 140 mmol/L (ref 135–145)
Total Bilirubin: 0.8 mg/dL (ref 0.3–1.2)
Total Protein: 7.6 g/dL (ref 6.5–8.1)

## 2021-07-14 LAB — LIPASE, BLOOD: Lipase: 30 U/L (ref 11–51)

## 2021-07-14 NOTE — ED Notes (Signed)
X2 no response and not visible in the lobby 

## 2021-07-14 NOTE — ED Triage Notes (Signed)
Onset 4-5 days had one episode of black stools with abdominal pain. States bowel movement normal however concerned why his stool was black. Pain 5/10 general achy abdominal pain. States has right knee for a long time worsening overtime currently 5/10 achy sore.

## 2021-07-15 LAB — BPAM RBC
Blood Product Expiration Date: 202307212359
Blood Product Expiration Date: 202307212359
Blood Product Expiration Date: 202307212359
Unit Type and Rh: 5100
Unit Type and Rh: 5100
Unit Type and Rh: 5100

## 2021-07-15 LAB — TYPE AND SCREEN
ABO/RH(D): O POS
Antibody Screen: POSITIVE
DAT, IgG: NEGATIVE
PT AG Type: POSITIVE
Unit division: 0
Unit division: 0
Unit division: 0

## 2022-02-26 ENCOUNTER — Encounter (HOSPITAL_COMMUNITY): Payer: Self-pay

## 2022-02-26 ENCOUNTER — Other Ambulatory Visit: Payer: Self-pay

## 2022-02-26 ENCOUNTER — Emergency Department (HOSPITAL_COMMUNITY)
Admission: EM | Admit: 2022-02-26 | Discharge: 2022-02-26 | Discharge status: Against Medical Advice | Payer: Medicare Other | Attending: Physician Assistant | Admitting: Physician Assistant

## 2022-02-26 DIAGNOSIS — M25561 Pain in right knee: Secondary | ICD-10-CM | POA: Diagnosis present

## 2022-02-26 DIAGNOSIS — G8929 Other chronic pain: Secondary | ICD-10-CM | POA: Insufficient documentation

## 2022-02-26 DIAGNOSIS — Y900 Blood alcohol level of less than 20 mg/100 ml: Secondary | ICD-10-CM | POA: Insufficient documentation

## 2022-02-26 DIAGNOSIS — Z5321 Procedure and treatment not carried out due to patient leaving prior to being seen by health care provider: Secondary | ICD-10-CM | POA: Insufficient documentation

## 2022-02-26 LAB — CBC WITH DIFFERENTIAL/PLATELET
Abs Immature Granulocytes: 0.01 10*3/uL (ref 0.00–0.07)
Basophils Absolute: 0 10*3/uL (ref 0.0–0.1)
Basophils Relative: 0 %
Eosinophils Absolute: 0.1 10*3/uL (ref 0.0–0.5)
Eosinophils Relative: 1 %
HCT: 36 % — ABNORMAL LOW (ref 39.0–52.0)
Hemoglobin: 11.9 g/dL — ABNORMAL LOW (ref 13.0–17.0)
Immature Granulocytes: 0 %
Lymphocytes Relative: 18 %
Lymphs Abs: 1.1 10*3/uL (ref 0.7–4.0)
MCH: 28.1 pg (ref 26.0–34.0)
MCHC: 33.1 g/dL (ref 30.0–36.0)
MCV: 84.9 fL (ref 80.0–100.0)
Monocytes Absolute: 0.6 10*3/uL (ref 0.1–1.0)
Monocytes Relative: 9 %
Neutro Abs: 4.5 10*3/uL (ref 1.7–7.7)
Neutrophils Relative %: 72 %
Platelets: 191 10*3/uL (ref 150–400)
RBC: 4.24 MIL/uL (ref 4.22–5.81)
RDW: 15.9 % — ABNORMAL HIGH (ref 11.5–15.5)
WBC: 6.3 10*3/uL (ref 4.0–10.5)
nRBC: 0 % (ref 0.0–0.2)

## 2022-02-26 LAB — COMPREHENSIVE METABOLIC PANEL
ALT: 21 U/L (ref 0–44)
AST: 28 U/L (ref 15–41)
Albumin: 3.2 g/dL — ABNORMAL LOW (ref 3.5–5.0)
Alkaline Phosphatase: 83 U/L (ref 38–126)
Anion gap: 8 (ref 5–15)
BUN: 15 mg/dL (ref 8–23)
CO2: 23 mmol/L (ref 22–32)
Calcium: 8.7 mg/dL — ABNORMAL LOW (ref 8.9–10.3)
Chloride: 107 mmol/L (ref 98–111)
Creatinine, Ser: 1 mg/dL (ref 0.61–1.24)
GFR, Estimated: >60 mL/min (ref >60)
Glucose, Bld: 120 mg/dL — ABNORMAL HIGH (ref 70–99)
Potassium: 3.5 mmol/L (ref 3.5–5.1)
Sodium: 138 mmol/L (ref 135–145)
Total Bilirubin: 0.7 mg/dL (ref 0.3–1.2)
Total Protein: 7.1 g/dL (ref 6.5–8.1)

## 2022-02-26 LAB — ETHANOL: Alcohol, Ethyl (B): <10 mg/dL (ref <10)

## 2022-02-26 NOTE — ED Notes (Signed)
Pt left AMA °

## 2022-02-26 NOTE — ED Triage Notes (Signed)
Pt presents with pressured speech with tangential, disorganized content. Pt states he is looking for a "black doctor to get all of his medical bills sent to his home. 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 I had a family give me raw oats." Pt denies SI/HI. When asked if he was hearing voices pt states, "not today."

## 2022-02-26 NOTE — ED Provider Triage Note (Signed)
Emergency Medicine Provider Triage Evaluation Note  [name] , a 82 y.o. male  was evaluated in triage.  Patient presents to the ED.  His speech is very tangential.  He states that he came to get all of his medical bills sent to his house and that he is looking to speak with Dr. Kennon Holter who is a neurosurgeon in the black community who is going to help him take care of all of his medical bills.  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  He denies SI or HI and denies any auditory or visual hallucinations at this time. His only physical complaint is right knee pain and states that his knee is "worn out".  He has history of osteoarthritis per his medical record acute injuries or change in his pain.  Review of Systems  Positive: Right knee pain-chronic Negative: Suicidal ideations, homicidal ideations  Physical Exam  BP (!) 161/93 (BP Location: Right Arm)   Pulse (!) 104   Temp 98.5 F (36.9 C) (Oral)   Resp (!) 24   Ht 5\' 8"  (1.727 m)   Wt 75 kg   SpO2 99%   BMI 25.14 kg/m  Gen:   Awake, no distress   Resp:  Normal effort  MSK:   Moves extremities without difficulty  Other:    Medical Decision Making  Medically screening exam initiated at 8:26 PM.  Appropriate orders placed.  [name] was informed that the remainder of the evaluation will be completed by another provider, this initial triage assessment does not replace that evaluation, and the importance of remaining in the ED until their evaluation is complete.     Gwenevere Abbot, [place] 02/26/22 2027

## 2022-08-18 IMAGING — CT CT HEAD W/O CM
3 series · 15 of 47 positions shown, 18 images · non-contrast
Comparison: None.

CLINICAL DATA: Vertigo

EXAM:
CT HEAD WITHOUT CONTRAST
TECHNIQUE: Contiguous axial images were obtained from the base of the skull
through the vertex without intravenous contrast.

[Series 2: head wo · axial · 0.47mm/px · z∈[-202,-62]mm · 9 of 34 slices shown, 12 images]
[im 3/34  brain]
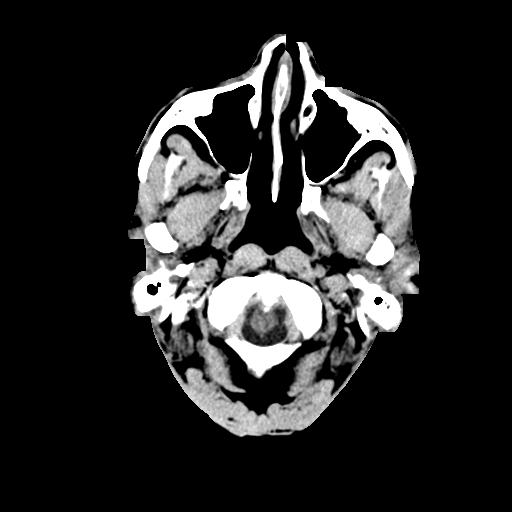
[im 3/34  bone]
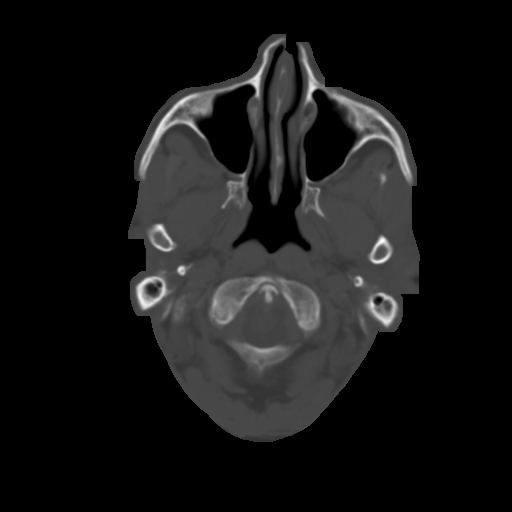
[im 6/34  brain]
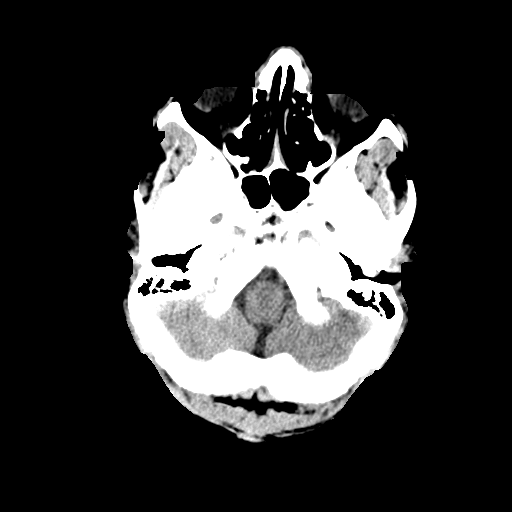
[im 10/34  brain]
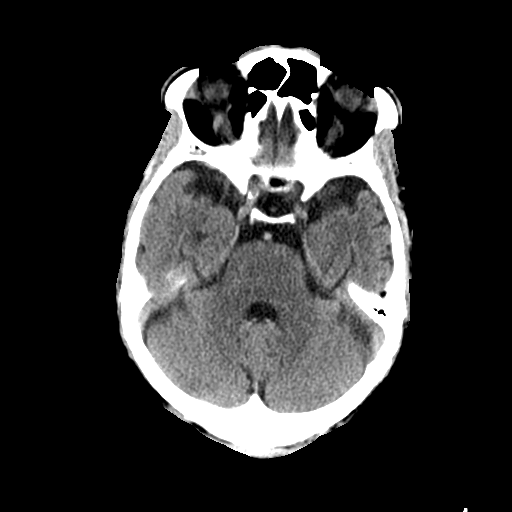
[im 13/34  brain]
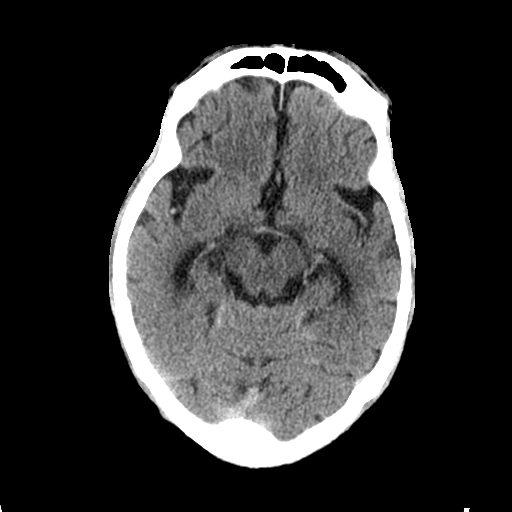
[im 18/34  brain]
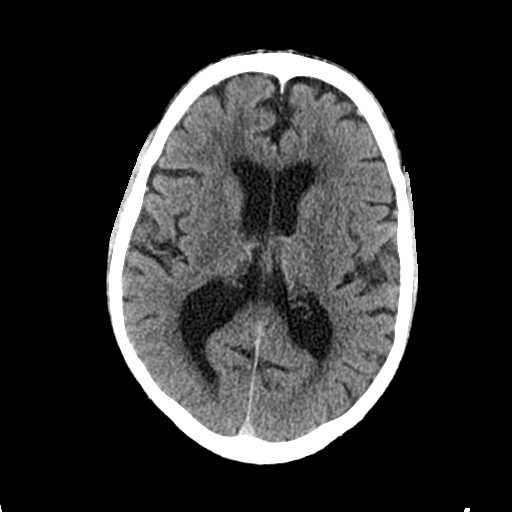
[im 18/34  bone]
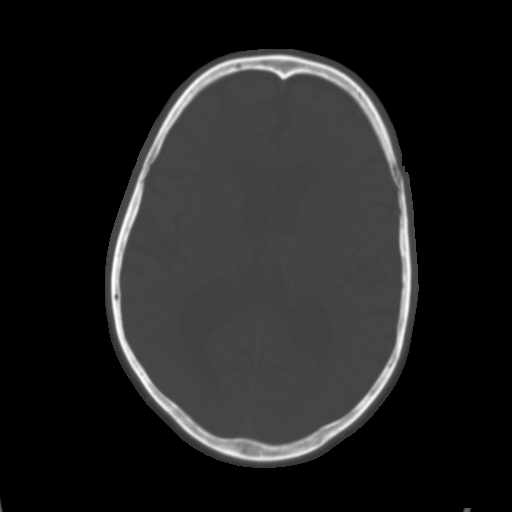
[im 21/34  brain]
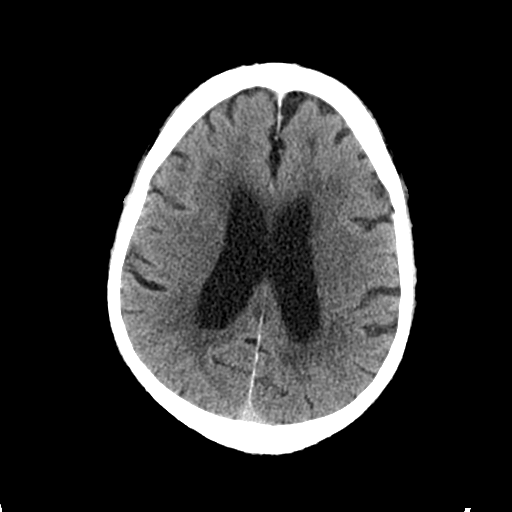
[im 24/34  brain]
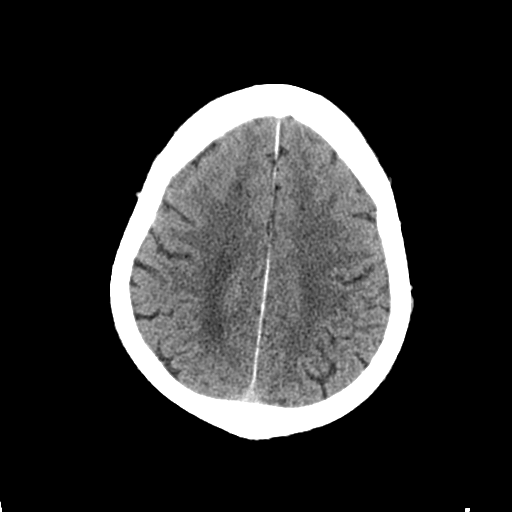
[im 28/34  brain]
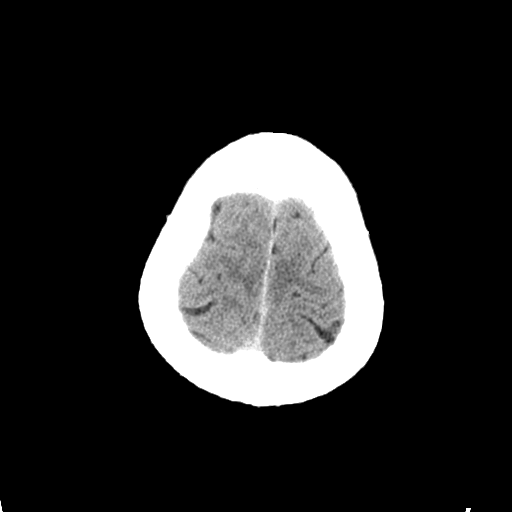
[im 31/34  brain]
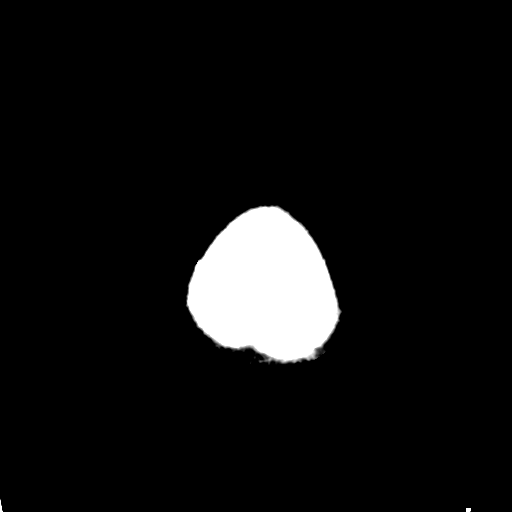
[im 31/34  bone]
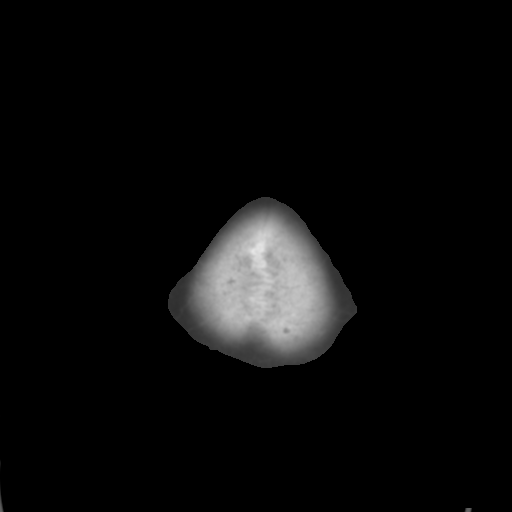

[Series 5: coronal soft tissue · coronal · 0.31mm/px · 3 of 70 slices shown]
[im 24/70  brain]
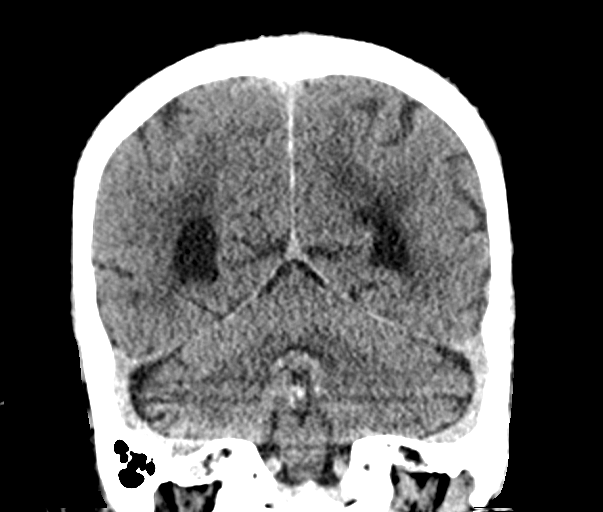
[im 31/70  brain]
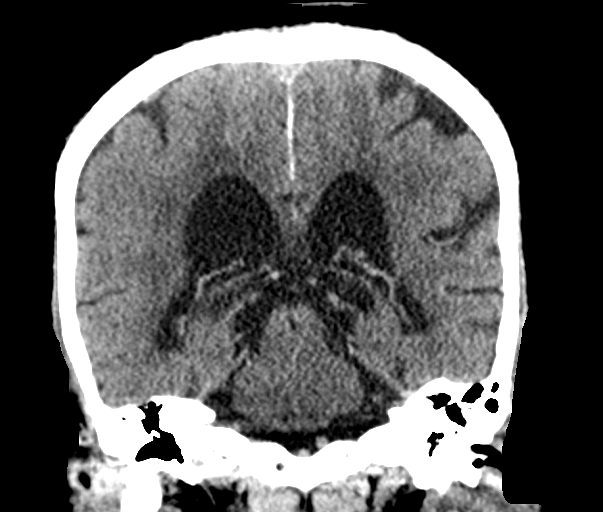
[im 39/70  brain]
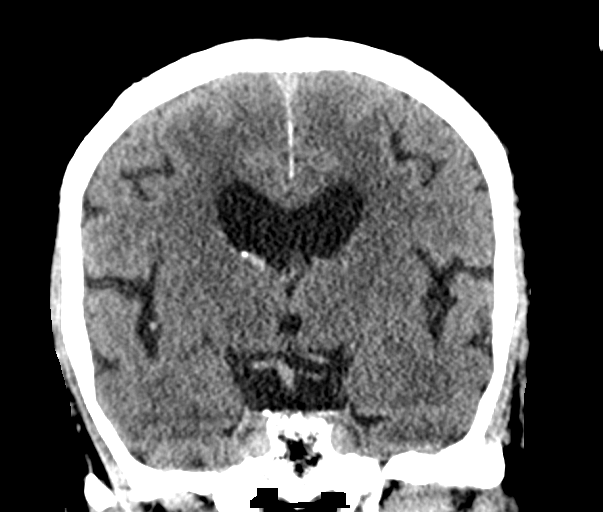

[Series 6: sagittal soft tissue · sagittal · 0.31mm/px · 3 of 63 slices shown]
[im 21/63  brain]
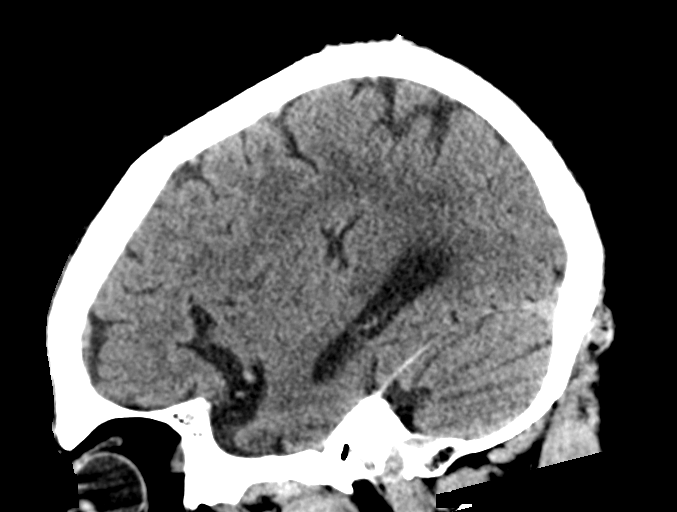
[im 32/63  brain]
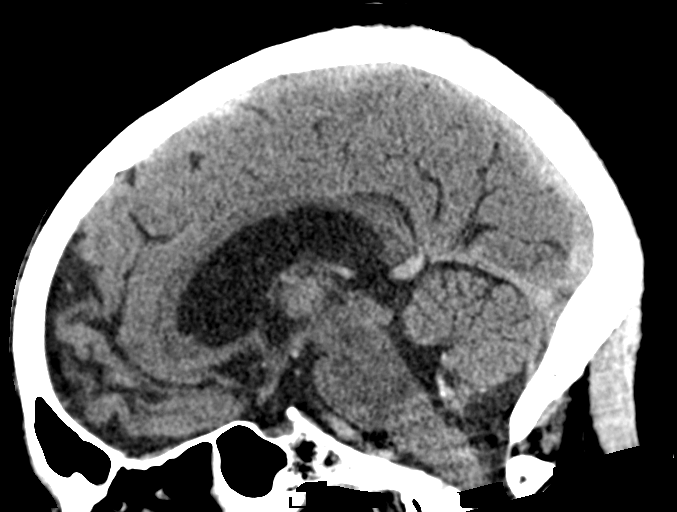
[im 42/63  brain]
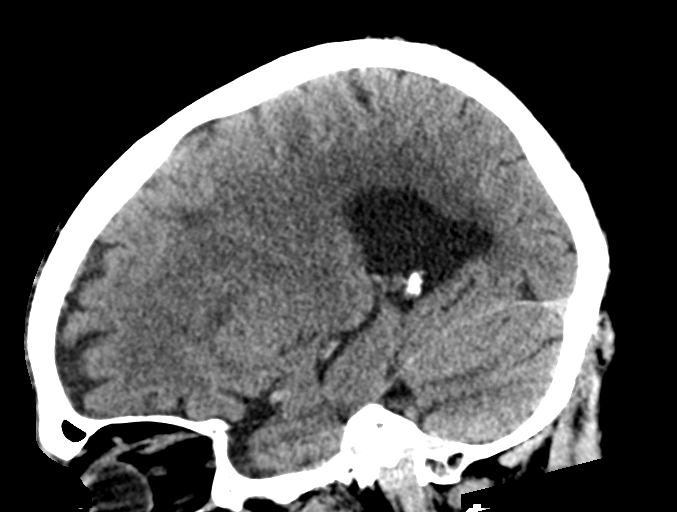

[15 of 47 positions shown; findings below may reference images not displayed]

FINDINGS: Brain: There is atrophy and chronic small vessel disease changes. No
acute intracranial abnormality. Specifically, no hemorrhage,
hydrocephalus, mass lesion, acute infarction, or significant
intracranial injury.

Vascular: No hyperdense vessel or unexpected calcification.

Skull: No acute calvarial abnormality.

Sinuses/Orbits: No acute findings

Other: None
IMPRESSION: Atrophy, chronic microvascular disease.

No acute intracranial abnormality.

## 2023-01-06 ENCOUNTER — Inpatient Hospital Stay (HOSPITAL_COMMUNITY)
Admission: EM | Admit: 2023-01-06 | Discharge: 2023-01-19 | DRG: 885 | Disposition: A | Discharge status: Home Health | Payer: No Typology Code available for payment source | Attending: Family Medicine | Admitting: Family Medicine

## 2023-01-06 ENCOUNTER — Emergency Department (HOSPITAL_COMMUNITY): Payer: No Typology Code available for payment source

## 2023-01-06 ENCOUNTER — Encounter (HOSPITAL_COMMUNITY): Payer: Self-pay | Admitting: Emergency Medicine

## 2023-01-06 ENCOUNTER — Other Ambulatory Visit: Payer: Self-pay

## 2023-01-06 DIAGNOSIS — N390 Urinary tract infection, site not specified: Secondary | ICD-10-CM | POA: Diagnosis present

## 2023-01-06 DIAGNOSIS — R011 Cardiac murmur, unspecified: Secondary | ICD-10-CM | POA: Diagnosis present

## 2023-01-06 DIAGNOSIS — F209 Schizophrenia, unspecified: Secondary | ICD-10-CM | POA: Diagnosis present

## 2023-01-06 DIAGNOSIS — I10 Essential (primary) hypertension: Secondary | ICD-10-CM | POA: Diagnosis present

## 2023-01-06 DIAGNOSIS — D649 Anemia, unspecified: Secondary | ICD-10-CM | POA: Diagnosis present

## 2023-01-06 DIAGNOSIS — Z91148 Patient's other noncompliance with medication regimen for other reason: Secondary | ICD-10-CM | POA: Diagnosis not present

## 2023-01-06 DIAGNOSIS — M7989 Other specified soft tissue disorders: Secondary | ICD-10-CM

## 2023-01-06 DIAGNOSIS — E876 Hypokalemia: Secondary | ICD-10-CM | POA: Diagnosis present

## 2023-01-06 DIAGNOSIS — Z79899 Other long term (current) drug therapy: Secondary | ICD-10-CM

## 2023-01-06 DIAGNOSIS — F32A Depression, unspecified: Secondary | ICD-10-CM | POA: Diagnosis present

## 2023-01-06 DIAGNOSIS — R3915 Urgency of urination: Secondary | ICD-10-CM | POA: Diagnosis present

## 2023-01-06 DIAGNOSIS — H9190 Unspecified hearing loss, unspecified ear: Secondary | ICD-10-CM | POA: Diagnosis present

## 2023-01-06 DIAGNOSIS — M542 Cervicalgia: Secondary | ICD-10-CM | POA: Diagnosis present

## 2023-01-06 DIAGNOSIS — R4182 Altered mental status, unspecified: Secondary | ICD-10-CM | POA: Diagnosis not present

## 2023-01-06 DIAGNOSIS — N400 Enlarged prostate without lower urinary tract symptoms: Secondary | ICD-10-CM | POA: Diagnosis present

## 2023-01-06 DIAGNOSIS — Z87891 Personal history of nicotine dependence: Secondary | ICD-10-CM | POA: Diagnosis not present

## 2023-01-06 DIAGNOSIS — F2089 Other schizophrenia: Secondary | ICD-10-CM | POA: Diagnosis not present

## 2023-01-06 HISTORY — DX: Altered mental status, unspecified: R41.82

## 2023-01-06 LAB — CBC WITH DIFFERENTIAL/PLATELET
Abs Immature Granulocytes: 0.05 10*3/uL (ref 0.00–0.07)
Basophils Absolute: 0 10*3/uL (ref 0.0–0.1)
Basophils Relative: 0 %
Eosinophils Absolute: 0 10*3/uL (ref 0.0–0.5)
Eosinophils Relative: 0 %
HCT: 43.7 % (ref 39.0–52.0)
Hemoglobin: 14.5 g/dL (ref 13.0–17.0)
Immature Granulocytes: 0 %
Lymphocytes Relative: 8 %
Lymphs Abs: 1 10*3/uL (ref 0.7–4.0)
MCH: 28.3 pg (ref 26.0–34.0)
MCHC: 33.2 g/dL (ref 30.0–36.0)
MCV: 85.4 fL (ref 80.0–100.0)
Monocytes Absolute: 1.1 10*3/uL — ABNORMAL HIGH (ref 0.1–1.0)
Monocytes Relative: 9 %
Neutro Abs: 10.7 10*3/uL — ABNORMAL HIGH (ref 1.7–7.7)
Neutrophils Relative %: 83 %
Platelets: 245 10*3/uL (ref 150–400)
RBC: 5.12 MIL/uL (ref 4.22–5.81)
RDW: 14.3 % (ref 11.5–15.5)
WBC: 12.9 10*3/uL — ABNORMAL HIGH (ref 4.0–10.5)
nRBC: 0 % (ref 0.0–0.2)

## 2023-01-06 LAB — COMPREHENSIVE METABOLIC PANEL
ALT: 16 U/L (ref 0–44)
AST: 21 U/L (ref 15–41)
Albumin: 3.2 g/dL — ABNORMAL LOW (ref 3.5–5.0)
Alkaline Phosphatase: 96 U/L (ref 38–126)
Anion gap: 11 (ref 5–15)
BUN: 8 mg/dL (ref 8–23)
CO2: 29 mmol/L (ref 22–32)
Calcium: 9.2 mg/dL (ref 8.9–10.3)
Chloride: 100 mmol/L (ref 98–111)
Creatinine, Ser: 0.94 mg/dL (ref 0.61–1.24)
GFR, Estimated: >60 mL/min (ref >60)
Glucose, Bld: 121 mg/dL — ABNORMAL HIGH (ref 70–99)
Potassium: 2.8 mmol/L — ABNORMAL LOW (ref 3.5–5.1)
Sodium: 140 mmol/L (ref 135–145)
Total Bilirubin: 1.7 mg/dL — ABNORMAL HIGH (ref <1.2)
Total Protein: 8.3 g/dL — ABNORMAL HIGH (ref 6.5–8.1)

## 2023-01-06 LAB — CBG MONITORING, ED: Glucose-Capillary: 114 mg/dL — ABNORMAL HIGH (ref 70–99)

## 2023-01-06 LAB — I-STAT VENOUS BLOOD GAS, ED
Acid-Base Excess: 8 mmol/L — ABNORMAL HIGH (ref 0.0–2.0)
Bicarbonate: 33.6 mmol/L — ABNORMAL HIGH (ref 20.0–28.0)
Calcium, Ion: 1.14 mmol/L — ABNORMAL LOW (ref 1.15–1.40)
HCT: 44 % (ref 39.0–52.0)
Hemoglobin: 15 g/dL (ref 13.0–17.0)
O2 Saturation: 90 %
Potassium: 2.7 mmol/L — CL (ref 3.5–5.1)
Sodium: 143 mmol/L (ref 135–145)
TCO2: 35 mmol/L — ABNORMAL HIGH (ref 22–32)
pCO2, Ven: 49.8 mm[Hg] (ref 44–60)
pH, Ven: 7.437 — ABNORMAL HIGH (ref 7.25–7.43)
pO2, Ven: 58 mm[Hg] — ABNORMAL HIGH (ref 32–45)

## 2023-01-06 LAB — I-STAT CHEM 8, ED
BUN: 10 mg/dL (ref 8–23)
Calcium, Ion: 1.11 mmol/L — ABNORMAL LOW (ref 1.15–1.40)
Chloride: 99 mmol/L (ref 98–111)
Creatinine, Ser: 0.9 mg/dL (ref 0.61–1.24)
Glucose, Bld: 123 mg/dL — ABNORMAL HIGH (ref 70–99)
HCT: 45 % (ref 39.0–52.0)
Hemoglobin: 15.3 g/dL (ref 13.0–17.0)
Potassium: 2.7 mmol/L — CL (ref 3.5–5.1)
Sodium: 143 mmol/L (ref 135–145)
TCO2: 32 mmol/L (ref 22–32)

## 2023-01-06 LAB — URINALYSIS, W/ REFLEX TO CULTURE (INFECTION SUSPECTED)
Bilirubin Urine: NEGATIVE
Glucose, UA: NEGATIVE mg/dL
Hgb urine dipstick: NEGATIVE
Ketones, ur: 5 mg/dL — AB
Nitrite: NEGATIVE
Protein, ur: >=300 mg/dL — AB
Specific Gravity, Urine: 1.022 (ref 1.005–1.030)
WBC, UA: >50 WBC/hpf (ref 0–5)
pH: 5 (ref 5.0–8.0)

## 2023-01-06 LAB — RAPID URINE DRUG SCREEN, HOSP PERFORMED
Amphetamines: NOT DETECTED
Barbiturates: NOT DETECTED
Benzodiazepines: NOT DETECTED
Cocaine: NOT DETECTED
Opiates: NOT DETECTED
Tetrahydrocannabinol: NOT DETECTED

## 2023-01-06 LAB — RESP PANEL BY RT-PCR (RSV, FLU A&B, COVID)  RVPGX2
Influenza A by PCR: NEGATIVE
Influenza B by PCR: NEGATIVE
Resp Syncytial Virus by PCR: NEGATIVE
SARS Coronavirus 2 by RT PCR: NEGATIVE

## 2023-01-06 LAB — ETHANOL: Alcohol, Ethyl (B): <10 mg/dL (ref <10)

## 2023-01-06 LAB — AMMONIA: Ammonia: 17 umol/L (ref 9–35)

## 2023-01-06 LAB — MAGNESIUM: Magnesium: 2 mg/dL (ref 1.7–2.4)

## 2023-01-06 LAB — I-STAT CG4 LACTIC ACID, ED: Lactic Acid, Venous: 1.9 mmol/L (ref 0.5–1.9)

## 2023-01-06 MED ORDER — SODIUM CHLORIDE 0.9 % IV SOLN
1.0000 g | Freq: Once | INTRAVENOUS | Status: AC
  Administered 2023-01-06: 1 g via INTRAVENOUS
  Filled 2023-01-06: qty 10

## 2023-01-06 MED ORDER — SODIUM CHLORIDE 0.9 % IV SOLN
1.0000 g | INTRAVENOUS | Status: DC
  Filled 2023-01-06: qty 10

## 2023-01-06 MED ORDER — ACETAMINOPHEN 325 MG PO TABS
650.0000 mg | ORAL_TABLET | Freq: Four times a day (QID) | ORAL | Status: DC | PRN
  Administered 2023-01-10: 650 mg via ORAL
  Filled 2023-01-06: qty 2

## 2023-01-06 MED ORDER — ENOXAPARIN SODIUM 40 MG/0.4ML IJ SOSY
40.0000 mg | PREFILLED_SYRINGE | INTRAMUSCULAR | Status: DC
  Administered 2023-01-07 – 2023-01-18 (×4): 40 mg via SUBCUTANEOUS
  Filled 2023-01-06 (×9): qty 0.4

## 2023-01-06 MED ORDER — MAGNESIUM SULFATE 2 GM/50ML IV SOLN
2.0000 g | Freq: Once | INTRAVENOUS | Status: AC
  Administered 2023-01-06: 2 g via INTRAVENOUS
  Filled 2023-01-06: qty 50

## 2023-01-06 MED ORDER — POTASSIUM CHLORIDE 10 MEQ/100ML IV SOLN
10.0000 meq | INTRAVENOUS | Status: AC
  Administered 2023-01-06 (×3): 10 meq via INTRAVENOUS
  Filled 2023-01-06 (×3): qty 100

## 2023-01-06 MED ORDER — POTASSIUM CHLORIDE CRYS ER 20 MEQ PO TBCR
40.0000 meq | EXTENDED_RELEASE_TABLET | Freq: Once | ORAL | Status: AC
  Administered 2023-01-06: 40 meq via ORAL
  Filled 2023-01-06: qty 2

## 2023-01-06 MED ORDER — POTASSIUM CHLORIDE IN NACL 20-0.9 MEQ/L-% IV SOLN
Freq: Once | INTRAVENOUS | Status: AC
  Filled 2023-01-06: qty 1000

## 2023-01-06 NOTE — ED Notes (Signed)
Patient transported to CT 

## 2023-01-06 NOTE — H&P (Signed)
Hospital Admission History and Physical Service Pager: [phone]  Patient name: [name] Medical record number: [id number] Date of Birth: 07/04/1940 Age: 82 y.o. Gender: male  Primary Care Provider: Patient, No Pcp Per Consultants: None Code Status: Full code Preferred Emergency Contact:  Contact Information     Name Relation Home Work Mobile   [place] Sister [phone]        Other Contacts   None on File      Chief Complaint: AMS  Assessment and Plan: [name] is a 82 y.o. male presenting with AMS as characterized by confusion in the setting of UTI. PMHX includes schizophrenia, dementia, hypertension, hepatitis C, hearing loss and BPH.  Unable to reach patient's sister for collateral therefore unable to obtain baseline mentation.  Unclear if patient is truly altered given history of schizophrenia and dementia.  Conversant and A&O x3 with some tangential speech and significant hearing difficulty but does not appear acutely psychotic.  Low concern for CVA given negative CT head and no weakness upon exam.  Low concern for sepsis given afebrile with reassuring labs and no infectious source.  Possible substance ingestion although UDS and alcohol level negative  Assessment & Plan AMS (altered mental status) Confusion likely secondary to untreated schizophrenia and underlying dementia and as above -Admitted to [facility], Dr. Deirdre Priest attending -Attempt to obtain collateral information from family -PT/OT Hypokalemia K 2.7, repleted. -AM BMP and Mag  UTI (urinary tract infection) Malodorous urine although patient denies abdominal pain and dysuria.  UA suggestive of possible UTI, ED initiated CTX.  Given limited exam, will continue antibiotics at this time. -F/u blood and urine culture results  Chronic and Stable Problems: HTN: Per chart review has been scribed HCTZ 12.5 mg in the past but denies taking medication currently.  Will hold and monitor BP.  FEN/GI:  Heart healthy VTE Prophylaxis: Lovenox  Disposition: Med telemetry  History of Present Illness:  [name] is a 82 y.o. male presenting with confusion that began at some point last night.  Patient is poor historian and unable to obtain collateral information from patient's sister.  HPI limited by patient's significant difficulty hearing despite many attempts to repeat questions.  Reports he saw and heard something abnormal last night that prompted him to tell the sister to call EMS.  Denies hallucinations today but does endorse history of schizophrenia and dementia.  Denies chest pain, shortness of breath and abdominal pain.  Malodorous urine but denies dysuria.  Receives all his care through the [name] and states he is "trying to get to [facility] to the psych hospital".  In the ED, found to be hypokalemic to 2.7 and UA consistent with UTI.  Review Of Systems: Per HPI  Pertinent Past Medical History: Schizophrenia Hypertension Hepatitis C BPH  Hearing loss Dementia Remainder reviewed in history tab.   Pertinent Past Surgical History: Knee surgery Remainder reviewed in history tab.   Pertinent Social History: Tobacco use: Former, has not smoked in years Alcohol use: None currently Other Substance use: Denies Lives with sister and her nephew  Pertinent Family History: None pertinent Remainder reviewed in history tab.   Important Outpatient Medications: Does not take any medications Remainder reviewed in medication history.   Objective: BP (!) 177/102 (BP Location: Right Arm)   Pulse 92   Temp 98 F (36.7 C) (Oral)   Resp 17   Ht 5\' 8"  (1.727 m)   Wt 76 kg   SpO2 98%   BMI 25.48  kg/m  Exam: General: Disheveled appearing, strong smell of malodorous urine.  NAD Eyes: PERRLA.  Conjunctival injection bilaterally noted ENTM: Moist mucus membranes.  Extremely poor dentition.  Neck: Supple, non-tender Cardiovascular: Irregular rhythm.  Regular rate.  No  murmurs. Respiratory: CTAB. Normal WOB on RA Gastrointestinal: Soft, non-tender, non-distended MSK: No peripheral edema Derm: Warm, dry, no rashes noted Neuro: Alert and oriented to person, time and situation. CN intact. Motor and sensation intact globally.  Psych: Tangential statements during conversation.  Difficulty hearing  Labs:  CBC BMET  Recent Labs  Lab 01/06/23 1113 01/06/23 1142  WBC 12.9*  --   HGB 14.5 15.0  15.3  HCT 43.7 44.0  45.0  PLT 245  --    Recent Labs  Lab 01/06/23 1113 01/06/23 1142  NA 140 143  143  K 2.8* 2.7*  2.7*  CL 100 99  CO2 29  --   BUN 8 10  CREATININE 0.94 0.90  GLUCOSE 121* 123*  CALCIUM 9.2  --     Pertinent additional labs: Lactic acid: 1.9 Quad screen: Negative UDS: Negative Alcohol: Negative BC pending Urine culture pending   EKG: Sinus rhythm with PVCs   Imaging Studies Performed: CT HEAD WO CONTRAST Result Date: 01/06/2023 IMPRESSION: 1. No acute intracranial abnormality. 2. No acute fracture or traumatic malalignment of the cervical spine. 3. 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  CT Cervical Spine Wo Contrast Result Date: 01/06/2023 IMPRESSION: 1. No acute intracranial abnormality. 2. No acute fracture or traumatic malalignment of the cervical spine. 3. 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  DG Chest Port 1 View Result Date: 01/06/2023 IMPRESSION: No active disease.     Elberta Fortis, MD 01/06/2023, 5:16 PM PGY-2, [facility]  FPTS Intern pager: [phone], text pages welcome Secure chat group [facility] Teaching Service

## 2023-01-06 NOTE — Assessment & Plan Note (Signed)
Malodorous urine although patient denies abdominal pain and dysuria.  UA suggestive of possible UTI, ED initiated CTX.  Given limited exam, will continue antibiotics at this time. -F/u blood and urine culture results

## 2023-01-06 NOTE — ED Notes (Addendum)
MD notified of patient blood pressure, heart rate, and refusal of Lovenox injection.

## 2023-01-06 NOTE — ED Notes (Signed)
ED TO INPATIENT HANDOFF REPORT  ED Nurse Name and Phone #: Dahlia Client [phone]  S Name/Age/Gender [name] 82 y.o. male Room/Bed: 035C/035C  Code Status   Code Status: Full Code  Home/SNF/Other Home Patient oriented to: self Is this baseline? No   Triage Complete: Triage complete  Chief Complaint AMS (altered mental status) [R41.82]  Triage Note Pt BIB [facility] from home due to altered mental status.  Pt lives with sister and in the past 3-4 days sister states patient has been acting not like himself.  Per [facility] room was disheveled and a lot of urine that was dark and cloudy.  Hx schizophrenia.  Pt is increasingly paranoid at this time.  Pt stopped taking all his medicines the last 30 days and has been only using herbal supplements.   [facility] VS BP 180/110, HR 120, CBG 157, SpO2 99%.  18 left AC.   Allergies No Known Allergies  Level of Care/Admitting Diagnosis ED Disposition     ED Disposition  Admit   Condition  --   Comment  Hospital Area: [facility] [[id number]]  Level of Care: Med-Surg [[id number]]  May admit patient to [facility] or [facility] if equivalent level of care is available:: No  Covid Evaluation: Asymptomatic - no recent exposure (last 10 days) testing not required  Diagnosis: AMS (altered mental status) [3474259]  Admitting Physician: Carney Living [[id number]]  Attending Physician: Carney Living [[id number]]  Certification:: I certify this patient will need inpatient services for at least 2 midnights  Expected Medical Readiness: 01/08/2023          B Medical/Surgery History Past Medical History:  Diagnosis Date   Benign hypertrophy of prostate    Hepatitis C    Schizophrenia (HCC)    Urinary tract infection    Past Surgical History:  Procedure Laterality Date   KNEE SURGERY     right     A IV Location/Drains/Wounds Patient Lines/Drains/Airways Status     Active Line/Drains/Airways     Name Placement date Placement  time Site Days   Peripheral IV 01/06/23 18 G Left Antecubital 01/06/23  1039  Antecubital  less than 1   Peripheral IV 01/06/23 20 G Anterior;Proximal;Right Forearm 01/06/23  1126  Forearm  less than 1            Intake/Output Last 24 hours  Intake/Output Summary (Last 24 hours) at 01/06/2023 1821 Last data filed at 01/06/2023 1457 Gross per 24 hour  Intake 100.34 ml  Output --  Net 100.34 ml    Labs/Imaging Results for orders placed or performed during the hospital encounter of 01/06/23 (from the past 48 hours)  Comprehensive metabolic panel     Status: Abnormal   Collection Time: 01/06/23 11:13 AM  Result Value Ref Range   Sodium 140 135 - 145 mmol/L   Potassium 2.8 (L) 3.5 - 5.1 mmol/L   Chloride 100 98 - 111 mmol/L   CO2 29 22 - 32 mmol/L   Glucose, Bld 121 (H) 70 - 99 mg/dL    Comment: Glucose reference range applies only to samples taken after fasting for at least 8 hours.   BUN 8 8 - 23 mg/dL   Creatinine, Ser 5.63 0.61 - 1.24 mg/dL   Calcium 9.2 8.9 - 87.5 mg/dL   Total Protein 8.3 (H) 6.5 - 8.1 g/dL   Albumin 3.2 (L) 3.5 - 5.0 g/dL   AST 21 15 - 41 U/L   ALT 16 0 -  44 U/L   Alkaline Phosphatase 96 38 - 126 U/L   Total Bilirubin 1.7 (H) <1.2 mg/dL   GFR, Estimated >16 >10 mL/min    Comment: (NOTE) Calculated using the CKD-EPI Creatinine Equation (2021)    Anion gap 11 5 - 15    Comment: Performed at [facility], [address] [place], [place] [address]  CBC with Differential/Platelet     Status: Abnormal   Collection Time: 01/06/23 11:13 AM  Result Value Ref Range   WBC 12.9 (H) 4.0 - 10.5 K/uL   RBC 5.12 4.22 - 5.81 MIL/uL   Hemoglobin 14.5 13.0 - 17.0 g/dL   HCT 40.9 81.1 - 91.4 %   MCV 85.4 80.0 - 100.0 fL   MCH 28.3 26.0 - 34.0 pg   MCHC 33.2 30.0 - 36.0 g/dL   RDW 78.2 95.6 - 21.3 %   Platelets 245 150 - 400 K/uL   nRBC 0.0 0.0 - 0.2 %   Neutrophils Relative % 83 %   Neutro Abs 10.7 (H) 1.7 - 7.7 K/uL   Lymphocytes Relative 8 %    Lymphs Abs 1.0 0.7 - 4.0 K/uL   Monocytes Relative 9 %   Monocytes Absolute 1.1 (H) 0.1 - 1.0 K/uL   Eosinophils Relative 0 %   Eosinophils Absolute 0.0 0.0 - 0.5 K/uL   Basophils Relative 0 %   Basophils Absolute 0.0 0.0 - 0.1 K/uL   Immature Granulocytes 0 %   Abs Immature Granulocytes 0.05 0.00 - 0.07 K/uL    Comment: Performed at [facility], [address] [place], [place] [address]  Ammonia     Status: None   Collection Time: 01/06/23 11:13 AM  Result Value Ref Range   Ammonia 17 9 - 35 umol/L    Comment: Performed at [facility], [address], [place], [place] [address]  Ethanol     Status: None   Collection Time: 01/06/23 11:14 AM  Result Value Ref Range   Alcohol, Ethyl (B) <10 <10 mg/dL    Comment: (NOTE) Lowest detectable limit for serum alcohol is 10 mg/dL.  For medical purposes only. Performed at [facility], [address] [place], [place] [address]   Magnesium     Status: None   Collection Time: 01/06/23 11:14 AM  Result Value Ref Range   Magnesium 2.0 1.7 - 2.4 mg/dL    Comment: Performed at [facility], [address] [place], [place] [address]  Resp panel by RT-PCR (RSV, Flu A&B, Covid)     Status: None   Collection Time: 01/06/23 11:15 AM   Specimen: Nasal Swab  Result Value Ref Range   SARS Coronavirus 2 by RT PCR NEGATIVE NEGATIVE   Influenza A by PCR NEGATIVE NEGATIVE   Influenza B by PCR NEGATIVE NEGATIVE    Comment: (NOTE) The Xpert Xpress SARS-CoV-2/FLU/RSV plus assay is intended as an aid in the diagnosis of influenza from Nasopharyngeal swab specimens and should not be used as a sole basis for treatment. Nasal washings and aspirates are unacceptable for Xpert Xpress SARS-CoV-2/FLU/RSV testing.  Fact Sheet for Patients: BloggerCourse.com  Fact Sheet for Healthcare Providers: SeriousBroker.it  This test is not yet approved or cleared by the [place] FDA  and has been authorized for detection and/or diagnosis of SARS-CoV-2 by FDA under an Emergency Use Authorization (EUA). This EUA will remain in effect (meaning this test can be used) for the duration of the COVID-19 declaration  under Section 564(b)(1) of the Act, 21 U.S.C. section 360bbb-3(b)(1), unless the authorization is terminated or revoked.     Resp Syncytial Virus by PCR NEGATIVE NEGATIVE    Comment: (NOTE) Fact Sheet for Patients: BloggerCourse.com  Fact Sheet for Healthcare Providers: SeriousBroker.it  This test is not yet approved or cleared by the [place] FDA and has been authorized for detection and/or diagnosis of SARS-CoV-2 by FDA under an Emergency Use Authorization (EUA). This EUA will remain in effect (meaning this test can be used) for the duration of the COVID-19 declaration under Section 564(b)(1) of the Act, 21 U.S.C. section 360bbb-3(b)(1), unless the authorization is terminated or revoked.  Performed at [facility], [address], [place], [place] [address]   CBG monitoring, ED     Status: Abnormal   Collection Time: 01/06/23 11:17 AM  Result Value Ref Range   Glucose-Capillary 114 (H) 70 - 99 mg/dL    Comment: Glucose reference range applies only to samples taken after fasting for at least 8 hours.  I-Stat Lactic Acid, ED     Status: None   Collection Time: 01/06/23 11:42 AM  Result Value Ref Range   Lactic Acid, Venous 1.9 0.5 - 1.9 mmol/L  I-Stat Chem 8, ED     Status: Abnormal   Collection Time: 01/06/23 11:42 AM  Result Value Ref Range   Sodium 143 135 - 145 mmol/L   Potassium 2.7 (LL) 3.5 - 5.1 mmol/L   Chloride 99 98 - 111 mmol/L   BUN 10 8 - 23 mg/dL   Creatinine, Ser 2.02 0.61 - 1.24 mg/dL   Glucose, Bld 542 (H) 70 - 99 mg/dL    Comment: Glucose reference range applies only to samples taken after fasting for at least 8 hours.   Calcium, Ion 1.11 (L) 1.15 - 1.40 mmol/L   TCO2  32 22 - 32 mmol/L   Hemoglobin 15.3 13.0 - 17.0 g/dL   HCT 70.6 23.7 - 62.8 %   Comment NOTIFIED PHYSICIAN   I-Stat venous blood gas, ED     Status: Abnormal   Collection Time: 01/06/23 11:42 AM  Result Value Ref Range   pH, Ven 7.437 (H) 7.25 - 7.43   pCO2, Ven 49.8 44 - 60 mmHg   pO2, Ven 58 (H) 32 - 45 mmHg   Bicarbonate 33.6 (H) 20.0 - 28.0 mmol/L   TCO2 35 (H) 22 - 32 mmol/L   O2 Saturation 90 %   Acid-Base Excess 8.0 (H) 0.0 - 2.0 mmol/L   Sodium 143 135 - 145 mmol/L   Potassium 2.7 (LL) 3.5 - 5.1 mmol/L   Calcium, Ion 1.14 (L) 1.15 - 1.40 mmol/L   HCT 44.0 39.0 - 52.0 %   Hemoglobin 15.0 13.0 - 17.0 g/dL   Sample type VENOUS    Comment NOTIFIED PHYSICIAN   Rapid urine drug screen (hospital performed)     Status: None   Collection Time: 01/06/23  4:00 PM  Result Value Ref Range   Opiates NONE DETECTED NONE DETECTED   Cocaine NONE DETECTED NONE DETECTED   Benzodiazepines NONE DETECTED NONE DETECTED   Amphetamines NONE DETECTED NONE DETECTED   Tetrahydrocannabinol NONE DETECTED NONE DETECTED   Barbiturates NONE DETECTED NONE DETECTED    Comment: (NOTE) DRUG SCREEN FOR MEDICAL PURPOSES ONLY.  IF CONFIRMATION IS NEEDED FOR ANY PURPOSE, NOTIFY LAB WITHIN 5 DAYS.  LOWEST DETECTABLE LIMITS FOR URINE DRUG SCREEN Drug Class  Cutoff (ng/mL) Amphetamine and metabolites    1000 Barbiturate and metabolites    200 Benzodiazepine                 200 Opiates and metabolites        300 Cocaine and metabolites        300 THC                            50 Performed at [facility], [address], [place], [place] [address]   Urinalysis, w/ Reflex to Culture (Infection Suspected) -Urine, Clean Catch     Status: Abnormal   Collection Time: 01/06/23  4:00 PM  Result Value Ref Range   Specimen Source URINE, CLEAN CATCH    Color, Urine AMBER (A) YELLOW    Comment: BIOCHEMICALS MAY BE AFFECTED BY COLOR   APPearance CLOUDY (A) CLEAR   Specific Gravity,  Urine 1.022 1.005 - 1.030   pH 5.0 5.0 - 8.0   Glucose, UA NEGATIVE NEGATIVE mg/dL   Hgb urine dipstick NEGATIVE NEGATIVE   Bilirubin Urine NEGATIVE NEGATIVE   Ketones, ur 5 (A) NEGATIVE mg/dL   Protein, ur >=846 (A) NEGATIVE mg/dL   Nitrite NEGATIVE NEGATIVE   Leukocytes,Ua LARGE (A) NEGATIVE   RBC / HPF 0-5 0 - 5 RBC/hpf   WBC, UA >50 0 - 5 WBC/hpf    Comment:        Reflex urine culture not performed if WBC <=10, OR if Squamous epithelial cells >5. If Squamous epithelial cells >5 suggest recollection.    Bacteria, UA MANY (A) NONE SEEN   Squamous Epithelial / HPF 6-10 0 - 5 /HPF   WBC Clumps PRESENT    Mucus PRESENT     Comment: Performed at [facility], [address], [place], [place] [address]   CT HEAD WO CONTRAST Result Date: 01/06/2023 CLINICAL DATA:  Mental status change, unknown cause; Neck pain, acute, no red flags EXAM: CT HEAD WITHOUT CONTRAST CT CERVICAL SPINE WITHOUT CONTRAST TECHNIQUE: Multidetector CT imaging of the head and cervical spine was performed following the standard protocol without intravenous contrast. Multiplanar CT image reconstructions of the cervical spine were also generated. RADIATION DOSE REDUCTION: This exam was performed according to the departmental dose-optimization program which includes automated exposure control, adjustment of the mA and/or kV according to patient size and/or use of iterative reconstruction technique. COMPARISON:  Head CT 03/03/20 FINDINGS: CT HEAD FINDINGS Brain: No hemorrhage. No hydrocephalus. No extra-axial fluid collection. No CT evidence of an acute cortical infarct. No mass effect. No mass lesion. There is a background of mild chronic microvascular ischemic change. 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 Vascular: No hyperdense vessel or unexpected calcification. Skull: Normal. Negative for fracture or focal lesion. Sinuses/Orbits: No middle ear or mastoid effusion. Paranasal sinuses clear. Orbits are unremarkable.  Other: None. CT CERVICAL SPINE FINDINGS Alignment: Normal. Skull base and vertebrae: No acute fracture. No primary bone lesion or focal pathologic process. Soft tissues and spinal canal: No prevertebral fluid or swelling. No visible canal hematoma. Disc levels: No CT evidence of high-grade spinal stenosis. 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 No acute intracranial abnormality. 2. No acute fracture or traumatic malalignment of the cervical spine. 3. 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  Electronically Signed   By: Lorenza Cambridge M.D.   On: 01/06/2023 13:46   CT Cervical Spine Wo Contrast Result Date: 01/06/2023 CLINICAL DATA:  Mental status change, unknown cause; Neck pain, acute, no red flags EXAM: CT HEAD WITHOUT CONTRAST CT CERVICAL SPINE WITHOUT CONTRAST TECHNIQUE: Multidetector CT imaging of the head and cervical spine was performed following the standard protocol without intravenous contrast. Multiplanar CT image reconstructions of the cervical spine were also generated. RADIATION DOSE REDUCTION: This exam was performed according to the departmental dose-optimization program which includes automated exposure control, adjustment of the mA and/or kV according to patient size and/or use of iterative reconstruction technique. COMPARISON:  Head CT 03/03/20 FINDINGS: CT HEAD FINDINGS Brain: No hemorrhage. No hydrocephalus. No extra-axial fluid collection. No CT evidence of an acute cortical infarct. No mass effect. No mass lesion. There is a background of mild chronic microvascular ischemic change. 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 Vascular: No hyperdense vessel or unexpected calcification. Skull: Normal. Negative for fracture or focal lesion. Sinuses/Orbits: No middle ear or mastoid effusion. Paranasal sinuses  clear. Orbits are unremarkable. Other: None. CT CERVICAL SPINE FINDINGS Alignment: Normal. Skull base and vertebrae: No acute fracture. No primary bone lesion or focal pathologic process. Soft tissues and spinal canal: No prevertebral fluid or swelling. No visible canal hematoma. Disc levels: No CT evidence of high-grade spinal stenosis. 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 No acute intracranial abnormality. 2. No acute fracture or traumatic malalignment of the cervical spine. 3. 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 Electronically Signed   By: Lorenza Cambridge M.D.   On: 01/06/2023 13:46   DG Chest Port 1 View Result Date: 01/06/2023 CLINICAL DATA:  Altered mental status EXAM: PORTABLE CHEST 1 VIEW COMPARISON:  06/13/2020 FINDINGS: Mild cardiomegaly. Aortic atherosclerosis. No focal airspace consolidation, pleural effusion, or pneumothorax. IMPRESSION: No active disease. Electronically Signed   By: Duanne Guess D.O.   On: 01/06/2023 12:27    Pending Labs Unresulted Labs (From admission, onward)     Start     Ordered   01/07/23 0500  Basic metabolic panel  Tomorrow morning,   R        01/06/23 1756   01/07/23 0500  CBC  Tomorrow morning,   R        01/06/23 1756   01/06/23 1114  Blood Culture (Routine X 2)  BLOOD CULTURE X 2,   R      01/06/23 1114            Vitals/Pain Today's Vitals   01/06/23 1245 01/06/23 1315 01/06/23 1445 01/06/23 1745  BP: (!) 164/99 (!) 168/101 (!) 177/102 (!) 158/100  Pulse: 84 94 92 94  Resp: 19 (!) 21 17 18   Temp:   98 F (36.7 C) 98.2 F (36.8 C)  TempSrc:   Oral Oral  SpO2: 98% 99% 98% 99%  Weight:      Height:      PainSc:        Isolation Precautions No active isolations  Medications Medications  0.9 % NaCl with KCl 20 mEq/ L  infusion (  Intravenous New Bag/Given 01/06/23 1704)  enoxaparin (LOVENOX) injection 40 mg (has no administration in time range)  acetaminophen (TYLENOL) tablet 650 mg (has no administration in time range)  magnesium sulfate IVPB 2 g 50 mL (  0 g Intravenous Stopped 01/06/23 1346)  potassium chloride 10 mEq in 100 mL IVPB (0 mEq Intravenous Stopped 01/06/23 1808)  cefTRIAXone (ROCEPHIN) 1 g in sodium chloride 0.9 % 100 mL IVPB (0 g Intravenous Stopped 01/06/23 1755)    Mobility walks     Focused Assessments     R Recommendations: See Admitting Provider Note  Report given to:   Additional Notes:

## 2023-01-06 NOTE — ED Triage Notes (Signed)
Pt BIB [facility] from home due to altered mental status.  Pt lives with sister and in the past 3-4 days sister states patient has been acting not like himself.  Per [facility] room was disheveled and a lot of urine that was dark and cloudy.  Hx schizophrenia.  Pt is increasingly paranoid at this time.  Pt stopped taking all his medicines the last 30 days and has been only using herbal supplements.   [facility] VS BP 180/110, HR 120, CBG 157, SpO2 99%.  18 left AC.

## 2023-01-06 NOTE — Assessment & Plan Note (Addendum)
K 2.7, repleted. -AM BMP and Mag

## 2023-01-06 NOTE — ED Provider Notes (Signed)
[name]   CSN: [id number] Arrival date & time: 01/06/23  1059     History  Chief Complaint  Patient presents with   Altered Mental Status    [name] is a 82 y.o. male.  [name] is a 82 y.o. male with a history of schizophrenia, hypertension, hepatitis C, BPH, who presents to the emergency department via EMS for evaluation of altered mental status.  Per EMS patient lives with his sister who reports that over the past 3 to 4 days patient has not been acting like himself.  She called EMS and when they arrived on scene they noted that the patient's room was disheveled with lots of urine throughout the room.  Urine was described as dark and cloudy.  Sister reports that the patient has become increasingly paranoid.  Patient stopped taking all medicines in the last 30 days and has only been using herbal supplements.  Patient noted to be hypertensive and tachycardic with EMS.  Patient reports some neck pain, he is unsure if he has had any sort of neck injury but denies any pain elsewhere.  Reports he has been applying Tiger balm to his neck for the past 10 days.  He denies any chest or abdominal pain, no shortness of breath.  He denies any known cough or fevers.  Unsure of urinary symptoms.  History limited due to underlying schizophrenia and altered mental status.  The history is provided by the patient, the EMS personnel and medical records.  Altered Mental Status      Home Medications Prior to Admission medications   Medication Sig Start Date End Date Taking? Authorizing Provider  hydrochlorothiazide (HYDRODIURIL) 12.5 MG tablet Take 1 tablet (12.5 mg total) by mouth daily. 03/03/20   Pricilla Loveless, MD  ibuprofen (ADVIL,MOTRIN) 200 MG tablet Take 400 mg by mouth every 6 (six) hours as needed for mild pain.    [provider]  naproxen (NAPROSYN) 375 MG tablet Take 1 tablet (375 mg total) by mouth 2 (two)  times daily. 01/08/21   Elpidio Anis, PA-C      Allergies    Patient has no known allergies.    Review of Systems   Review of Systems  Unable to perform ROS: Mental status change    Physical Exam Updated Vital Signs BP (!) 177/102   Pulse (!) 118   Temp 97.9 F (36.6 C) (Oral)   Resp 18   Ht 5\' 8"  (1.727 m)   Wt 76 kg   SpO2 99%   BMI 25.48 kg/m  Physical Exam Vitals and nursing note reviewed.  Constitutional:      General: He is not in acute distress.    Appearance: Normal appearance. He is well-developed. He is not diaphoretic.     Comments: Alert, very hard of hearing but able to answer questions, becoming increasingly paranoid during encounter  HENT:     Head: Normocephalic and atraumatic.     Mouth/Throat:     Mouth: Mucous membranes are moist.     Pharynx: Oropharynx is clear.  Eyes:     General:        Right eye: No discharge.        Left eye: No discharge.     Extraocular Movements: Extraocular movements intact.     Pupils: Pupils are equal, round, and reactive to light.  Cardiovascular:     Rate and Rhythm: Normal rate and regular rhythm.  Pulses: Normal pulses.     Heart sounds: Normal heart sounds.  Pulmonary:     Effort: Pulmonary effort is normal. No respiratory distress.     Breath sounds: Normal breath sounds. No wheezing or rales.     Comments: Respirations equal and unlabored, patient able to speak in full sentences, lungs clear to auscultation bilaterally  Abdominal:     General: Bowel sounds are normal. There is no distension.     Palpations: Abdomen is soft. There is no mass.     Tenderness: There is no abdominal tenderness. There is no guarding.     Comments: Abdomen soft, nondistended, nontender to palpation in all quadrants without guarding or peritoneal signs  Musculoskeletal:        General: No deformity.     Cervical back: Neck supple.     Right lower leg: No edema.     Left lower leg: No edema.  Skin:    General: Skin is warm  and dry.     Capillary Refill: Capillary refill takes less than 2 seconds.  Neurological:     Mental Status: He is alert and oriented to person, place, and time.     Coordination: Coordination normal.     Comments: Speech is clear, difficulty following commands No facial droop 5/5 strength in upper and lower extremities Sensation normal to light and sharp touch Moves extremities without ataxia, coordination intact  Psychiatric:        Attention and Perception: He is inattentive.        Mood and Affect: Affect is labile.        Speech: Speech is tangential.        Thought Content: Thought content is paranoid.     ED Results / Procedures / Treatments   Labs (all labs ordered are listed, but only abnormal results are displayed) Labs Reviewed  COMPREHENSIVE METABOLIC PANEL - Abnormal; Notable for the following components:      Result Value   Potassium 2.8 (*)    Glucose, Bld 121 (*)    Total Protein 8.3 (*)    Albumin 3.2 (*)    Total Bilirubin 1.7 (*)    All other components within normal limits  CBC WITH DIFFERENTIAL/PLATELET - Abnormal; Notable for the following components:   WBC 12.9 (*)    Neutro Abs 10.7 (*)    Monocytes Absolute 1.1 (*)    All other components within normal limits  CBG MONITORING, ED - Abnormal; Notable for the following components:   Glucose-Capillary 114 (*)    All other components within normal limits  I-STAT CHEM 8, ED - Abnormal; Notable for the following components:   Potassium 2.7 (*)    Glucose, Bld 123 (*)    Calcium, Ion 1.11 (*)    All other components within normal limits  I-STAT VENOUS BLOOD GAS, ED - Abnormal; Notable for the following components:   pH, Ven 7.437 (*)    pO2, Ven 58 (*)    Bicarbonate 33.6 (*)    TCO2 35 (*)    Acid-Base Excess 8.0 (*)    Potassium 2.7 (*)    Calcium, Ion 1.14 (*)    All other components within normal limits  RESP PANEL BY RT-PCR (RSV, FLU A&B, COVID)  RVPGX2  CULTURE, BLOOD (ROUTINE X 2)   CULTURE, BLOOD (ROUTINE X 2)  AMMONIA  ETHANOL  MAGNESIUM  RAPID URINE DRUG SCREEN, HOSP PERFORMED  URINALYSIS, W/ REFLEX TO CULTURE (INFECTION SUSPECTED)  I-STAT CG4 LACTIC ACID,  ED    EKG EKG Interpretation Date/Time:  Thursday January 06 2023 11:15:51 EST Ventricular Rate:  103 PR Interval:  156 QRS Duration:  75 QT Interval:  336 QTC Calculation: 440 R Axis:   -20  Text Interpretation: Sinus arrhythmia Ventricular premature complex Probable left atrial enlargement Borderline left axis deviation Confirmed by Alvino Blood ([id number]) on 01/06/2023 11:23:45 AM  Radiology DG Chest Port 1 View Result Date: 01/06/2023 CLINICAL DATA:  Altered mental status EXAM: PORTABLE CHEST 1 VIEW COMPARISON:  06/13/2020 FINDINGS: Mild cardiomegaly. Aortic atherosclerosis. No focal airspace consolidation, pleural effusion, or pneumothorax. IMPRESSION: No active disease. Electronically Signed   By: Duanne Guess D.O.   On: 01/06/2023 12:27    Procedures Procedures    Medications Ordered in ED Medications  potassium chloride 10 mEq in 100 mL IVPB (0 mEq Intravenous Stopped 01/06/23 1457)  magnesium sulfate IVPB 2 g 50 mL (0 g Intravenous Stopped 01/06/23 1346)    ED Course/ Medical Decision Making/ A&P                                 Medical Decision Making Amount and/or Complexity of Data Reviewed Labs: ordered. Radiology: ordered.  Risk Prescription drug management.   82 y.o. male presents to the ED with complaints of altered mental status, this involves an extensive number of treatment options, and is a complaint that carries with it a high risk of complications and morbidity.  The differential diagnosis includes infection, electrolyte derangement, hyperammonemia, substance use, intoxication, intracranial mass, intracranial bleed, stroke, seizure, press syndrome, hypertensive urgency/emergency, decompensated schizophrenia in the setting of stopping medications,   On arrival  pt is nontoxic, patient noted to be hypertensive, tachycardic initially on arrival, suspect this was due to some agitation, tachycardia resolved without intervention.  Additional history obtained from EMS personnel, medical records. Previous records obtained and reviewed   Lab Tests:  I Ordered, reviewed, and interpreted labs, which included: Mild leukocytosis of 12.9, normal hemoglobin, potassium of 2.8, normal magnesium level, no other significant electrolyte derangement, normal renal and liver function.  No acidosis noted on VBG.  Lactic acid is normal.  COVID, flu and RSV panel negative.  Urinalysis and UDS pending.  Imaging Studies ordered:  I ordered imaging studies which included chest x-ray, head and cervical spine CT, I independently visualized and interpreted imaging which showed no acute cardiopulmonary disease on x-ray, head and cervical spine CT pending at shift change.  ED Course:   Patient presents with altered mental status, difficult to differentiate medical etiologies in setting of underlying schizophrenia after stopping all medications.  Laboratory evaluations been significant for hypokalemia which could be contributing, no other significant electrolyte derangements noted.  Potassium replacement initiated and patient given magnesium as well.  Patient was noted to be urinating throughout his room prior to EMS transport, urinalysis is pending, patient does have history of prior UTI and this could certainly be contributing.  Head and cervical spine imaging is contributing as well.  Patient does report some neck pain, he is not ill-appearing and this has been present for greater than a week, lower suspicion for something like meningitis.  At a minimum patient will need psychiatric evaluation but suspect he would likely benefit from medical admission as well pending remaining components of ED workup.  At shift change care signed out to Dr. Jeraldine Loots who will follow-up on pending studies  and disposition appropriately.  Portions of this note were  generated with Scientist, clinical (histocompatibility and immunogenetics). Dictation errors may occur despite best attempts at proofreading.         Final Clinical Impression(s) / ED Diagnoses Final diagnoses:  Altered mental status, unspecified altered mental status type  Hypokalemia    Rx / DC Orders ED Discharge Orders     None         Velda Shell 01/06/23 1523    Gerhard Munch, MD 01/06/23 1744

## 2023-01-06 NOTE — ED Notes (Signed)
Awaiting potassium chloride infusion from main pharmacy

## 2023-01-06 NOTE — Progress Notes (Signed)
New Admission Note: 73M-01   Arrival Method: ED stretcher Mental Orientation: a/ox3 Telemetry: n/a Assessment:completed Skin: intact, abrasion right leg IV: left ac, right ac Pain:n/a Tubes: n/a Safety Measures: bed alarm, nonskid socks Admission: completed Orientation: Patient has been oriented to the room, unit and staff.  Family: n/a Belongings: at bedside  Orders have been reviewed and implemented. Will continue to monitor the patient. Call light has been placed within reach and bed alarm has been activated.   Fabian Sharp BSN, RN-BC Phone number: [phone]

## 2023-01-06 NOTE — ED Notes (Signed)
Phlebotomy to collect second set of cultures

## 2023-01-06 NOTE — Assessment & Plan Note (Signed)
Confusion likely secondary to untreated schizophrenia and underlying dementia and as above -Admitted to [facility]MT[facility], Dr. Deirdre Priest attending -Attempt to obtain collateral information from family -PT/OT

## 2023-01-06 NOTE — ED Notes (Signed)
Pt agitated at this time, refusing to allow cardiac leads to be put back on. Pt educated on importance of cardiac monitoring.

## 2023-01-06 NOTE — Plan of Care (Signed)
FMTS Brief Progress Note  S: Patient seen at bedside with Dr. Royal Piedra.  States he would like to get transferred to [facility]. Denies acute concerns.    O: BP (!) 176/113   Pulse (!) 110   Temp 98.2 F (36.8 C) (Oral)   Resp 18   Ht 5\' 8"  (1.727 m)   Wt 76 kg   SpO2 96%   BMI 25.48 kg/m   General: disheveled, NAD  HEENT: No sign of trauma, poor dentition  Cardiac: RRR, no m/r/g Respiratory: CTAB, normal WOB, no w/c/r GI: Soft, NTTP, non-distended  Extremities: no peripheral edema. Neuro: Alert to person, place, time and situation. No gross neurological deficits noted. Psych: tangential speech at times.      A/P: [name] is an 82 year old male who presented with altered mental status and also found to have UTI.  Patient has pertinent past medical history of schizophrenia, dementia hypertension, hep C, hearing loss, BPH.   AMS Appears alert and oriented. Hyperfixated on getting transferred to [facility] but able to redirect conversation.  - continue to monitor - Orders reviewed. Labs for AM ordered, which was adjusted as needed.  - If condition changes, plan includes reassessment and interventions as needed.   UTI Found to have malodorous urine with UA suggestive of UTI. S/p ceftriaxone x1 in ED - continue abx for UTI  HTN Patient with history of hypertension with prescription for hydrochlorothiazide 12.5 mg, however denies medication currently. Patients BP elevated on exam, though also tachycardic while discussing transfer. May be in the setting of new environment given patients dementia.  - continue to monitor - consider home meds in AM  Penne Lash, MD 01/06/2023, 9:39 PM PGY-1, [facility] Family Medicine Night Resident  Please page [phone] with questions.

## 2023-01-07 DIAGNOSIS — R4182 Altered mental status, unspecified: Secondary | ICD-10-CM | POA: Diagnosis not present

## 2023-01-07 LAB — MAGNESIUM: Magnesium: 1.9 mg/dL (ref 1.7–2.4)

## 2023-01-07 LAB — CBC
HCT: 39.2 % (ref 39.0–52.0)
Hemoglobin: 13.3 g/dL (ref 13.0–17.0)
MCH: 29 pg (ref 26.0–34.0)
MCHC: 33.9 g/dL (ref 30.0–36.0)
MCV: 85.6 fL (ref 80.0–100.0)
Platelets: 243 10*3/uL (ref 150–400)
RBC: 4.58 MIL/uL (ref 4.22–5.81)
RDW: 14.4 % (ref 11.5–15.5)
WBC: 9.9 10*3/uL (ref 4.0–10.5)
nRBC: 0 % (ref 0.0–0.2)

## 2023-01-07 LAB — BASIC METABOLIC PANEL
Anion gap: 8 (ref 5–15)
BUN: 8 mg/dL (ref 8–23)
CO2: 28 mmol/L (ref 22–32)
Calcium: 9.2 mg/dL (ref 8.9–10.3)
Chloride: 105 mmol/L (ref 98–111)
Creatinine, Ser: 1.05 mg/dL (ref 0.61–1.24)
GFR, Estimated: >60 mL/min (ref >60)
Glucose, Bld: 104 mg/dL — ABNORMAL HIGH (ref 70–99)
Potassium: 3.1 mmol/L — ABNORMAL LOW (ref 3.5–5.1)
Sodium: 141 mmol/L (ref 135–145)

## 2023-01-07 MED ORDER — CEFADROXIL 500 MG PO CAPS
500.0000 mg | ORAL_CAPSULE | Freq: Two times a day (BID) | ORAL | Status: AC
  Administered 2023-01-07 – 2023-01-10 (×8): 500 mg via ORAL
  Filled 2023-01-07 (×8): qty 1

## 2023-01-07 MED ORDER — POTASSIUM CHLORIDE CRYS ER 20 MEQ PO TBCR
40.0000 meq | EXTENDED_RELEASE_TABLET | Freq: Once | ORAL | Status: AC
  Administered 2023-01-07: 40 meq via ORAL
  Filled 2023-01-07: qty 2

## 2023-01-07 NOTE — Evaluation (Signed)
Physical Therapy Evaluation Patient Details Name: [name] MRN: [id number] DOB: August 21, 1940 Today's Date: 01/07/2023  History of Present Illness  Pt is an 82 y/o M admitted on 01/07/23 after presenting with c/o of AMS. Pt is being treated for UTI. PMH: schizophrenia, dementia, HTN, Hep C, hearing loss, BPH  Clinical Impression  Pt seen for PT evaluation with pt agreeable to tx. Pt is AxOx3 but disoriented to situation & with impaired awareness. Pt speaks as if he's homeless but then references being at his sister's house. Pt does report he's ambulatory with RW & is able to ambulate to doorway & back with RW & supervision, 1 LOB but able to self correct. Pt declines further gait 2/2 lunch tray arriving & pt wishing to eat. Will continue to follow pt acutely to address balance, safety with mobility, & gait with LRAD.        If plan is discharge home, recommend the following: A little help with walking and/or transfers;A little help with bathing/dressing/bathroom;Assistance with cooking/housework;Assist for transportation;Help with stairs or ramp for entrance;Direct supervision/assist for medications management;Direct supervision/assist for financial management   Can travel by private vehicle        Equipment Recommendations None recommended by PT  Recommendations for Other Services       Functional Status Assessment Patient has had a recent decline in their functional status and demonstrates the ability to make significant improvements in function in a reasonable and predictable amount of time.     Precautions / Restrictions Precautions Precautions: Fall Restrictions Weight Bearing Restrictions Per Provider Order: No      Mobility  Bed Mobility               General bed mobility comments: not tested, pt received in recliner, left sitting EOB    Transfers Overall transfer level: Needs assistance Equipment used: Rolling walker (2 wheels) Transfers: Sit to/from  Stand Sit to Stand: Supervision           General transfer comment: STS from recliner with RW    Ambulation/Gait Ambulation/Gait assistance: Supervision Gait Distance (Feet): 20 Feet Assistive device: Rolling walker (2 wheels) Gait Pattern/deviations: Trunk flexed, Decreased step length - right, Decreased step length - left, Decreased stride length Gait velocity: decreased     General Gait Details: Pt ambulates to doorway & back to room with RW & supervision, 1 LOB but pt able to recover without assistance. Pt pushing RW out in front of him.  Stairs            Wheelchair Mobility     Tilt Bed    Modified Rankin (Stroke Patients Only)       Balance Overall balance assessment: Needs assistance Sitting-balance support: Feet supported Sitting balance-Leahy Scale: Good     Standing balance support: During functional activity, Reliant on assistive device for balance, Bilateral upper extremity supported Standing balance-Leahy Scale: Fair                               Pertinent Vitals/Pain Pain Assessment Pain Assessment: No/denies pain    Home Living Family/patient expects to be discharged to:: Private residence Living Arrangements:  (sister) Available Help at Discharge: Family Type of Home: House Home Access: Level entry       Home Layout: One level Home Equipment: Agricultural consultant (2 wheels) Additional Comments: Pt speaking as if he's homeless but per chart & case Research officer, political party pt lives with sister. Pt  reports he uses walker, unable to provide bathroom set up.    Prior Function               Mobility Comments: Ambulatory with RW       Extremity/Trunk Assessment   Upper Extremity Assessment Upper Extremity Assessment: Overall WFL for tasks assessed    Lower Extremity Assessment Lower Extremity Assessment: Overall WFL for tasks assessed;Generalized weakness (genu varus?)       Communication    Communication Communication: Hearing impairment  Cognition Arousal: Alert Behavior During Therapy: Impulsive Overall Cognitive Status: No family/caregiver present to determine baseline cognitive functioning Area of Impairment: Orientation, Attention, Memory, Following commands, Safety/judgement, Awareness                 Orientation Level: Disoriented to, Situation   Memory: Decreased short-term memory Following Commands: Follows one step commands inconsistently, Follows one step commands with increased time Safety/Judgement: Decreased awareness of safety, Decreased awareness of deficits Awareness: Intellectual   General Comments: Pt is oriented to self, location, & time but not situation.        General Comments      Exercises     Assessment/Plan    PT Assessment Patient needs continued PT services  PT Problem List Decreased cognition;Decreased activity tolerance;Decreased balance;Decreased mobility;Decreased safety awareness;Decreased knowledge of use of DME;Decreased strength       PT Treatment Interventions DME instruction;Balance training;Modalities;Gait training;Neuromuscular re-education;Stair training;Functional mobility training;Cognitive remediation;Therapeutic activities;Therapeutic exercise;Manual techniques;Patient/family education    PT Goals (Current goals can be found in the Care Plan section)  Acute Rehab PT Goals Patient Stated Goal: to eat lunch PT Goal Formulation: With patient Time For Goal Achievement: 01/21/23 Potential to Achieve Goals: Good    Frequency Min 1X/week     Co-evaluation               AM-PAC PT "6 Clicks" Mobility  Outcome Measure Help needed turning from your back to your side while in a flat bed without using bedrails?: None Help needed moving from lying on your back to sitting on the side of a flat bed without using bedrails?: A Little Help needed moving to and from a bed to a chair (including a wheelchair)?: A  Little Help needed standing up from a chair using your arms (e.g., wheelchair or bedside chair)?: A Little Help needed to walk in hospital room?: A Little Help needed climbing 3-5 steps with a railing? : A Little 6 Click Score: 19    End of Session   Activity Tolerance: Patient tolerated treatment well Patient left: in bed;with call bell/phone within reach;with bed alarm set Nurse Communication: Mobility status PT Visit Diagnosis: Unsteadiness on feet (R26.81);Muscle weakness (generalized) (M62.81);Other abnormalities of gait and mobility (R26.89)    Time: 1610-9604 PT Time Calculation (min) (ACUTE ONLY): 13 min   Charges:   PT Evaluation $PT Eval Low Complexity: 1 Low   PT General Charges $$ ACUTE PT VISIT: 1 Visit         Aleda Grana, PT, DPT 01/07/23, 12:11 PM   Sandi Mariscal 01/07/2023, 12:09 PM

## 2023-01-07 NOTE — Hospital Course (Addendum)
[name] is a 82 y.o.male with a history of schizophrenia, dementia, hypertension, hep C, hearing loss and BPH who was admitted to the family medicine teaching Service at [facility] for AMS. His hospital course is detailed below:  AMS Uncertain where patient's baseline mental status is, family did not provide collateral information. He has been seen at the [facility] for a history of schizophrenia, but is not currently on medication. CT head and neck in ED were negative for acute injury or intracranial concerns. Mental status appeared stable during admission, likely 2/2 to schizophrenia.   Schizophrenia Patient agitated toward staff over the course of his stay, and exhibited repeated religious delusions, paranoia surrounding finances, and fixation on past legal issues. Ultimately it was decided he was not able to care for himself or make medical decisions appropriately, and IVC was enacted. Patient was not compliant with medication prior to admission, thus psychiatry was consulted for medication recommendations.  Psychiatry recommended Haldol 5 mg once in the morning, 10 at bedtime with as needed Haldol for agitation. Patient ultimately transferred to inpatient psychiatric facility*** per psychiatry recommendations.  UTI Asymptomatic with malodorous urine and UA suggestive of UTI.  Received 1 dose CTX (12/12) and transition to cefadroxil (12/13-12/16)  Asymmetric LE Edema Concern for asymmetric lower extremity edema.  DVT ultrasound negative this resolved prior to discharge.  Other chronic conditions were medically managed with home medications and formulary alternatives as necessary   PCP Follow-up Recommendations: Psychiatry follow up Slight systolic murmur. Recommend outpatient echocardiogram Would highly recommend audiology referral to evaluate for hearing aids

## 2023-01-07 NOTE — TOC CM/SW Note (Addendum)
Transition of Care [facility]) - Inpatient Brief Assessment   Patient Details  Name: [name] MRN: [id number] Date of Birth: [birth year]  [facility] CM/SW Contact:    Tom-Johnson, Hershal Coria, RN Phone Number: 01/07/2023, 12:11 PM   Clinical Narrative:  Patient presented to the ED with Altered Mental Status and is treated for UTI, on IV abs. Pt states he lives with sister and requesting to go to the [facility] hospital. Patient was not making much sense at times, he was jumping from one topic to another. Patient has hx of Schizophrenia and is  Paranoid at times.  Patient showed CM a check of nine thousand dollars stating he wants to pay his Medical bills. CM informed patient that his bills will be sent to him in the mail and asked patient if he would like to keep his wallet with Security, patient declined.  CM encouraged patient to put his wallet back in his backpack so will not misplace it.   Home health recommended, patient states he has no preference. Referral [facility] in [facility] and [facility] voiced acceptance, info on AVS[facility]   [phone].  Patient not Medically ready for discharge.  CM will continue to follow as patient progresses with care towards discharge.      Transition of Care Asessment: Insurance and Status: Insurance coverage has been reviewed Patient has primary care physician: No (States his PCP at the [name] retired, want to go to the [name] to get a new PCP.) Home environment has been reviewed: Yes Prior level of function:: Independent- Lives with sister Prior/Current Home Services: No current home services Social Drivers of Health Review: SDOH reviewed no interventions necessary Readmission risk has been reviewed: Yes Transition of care needs: transition of care needs identified, TOC will continue to follow

## 2023-01-07 NOTE — Assessment & Plan Note (Addendum)
K 3.1, repleted again. -AM BMP and Mag if patient not discharged

## 2023-01-07 NOTE — Plan of Care (Signed)

## 2023-01-07 NOTE — Evaluation (Signed)
Occupational Therapy Evaluation Patient Details Name: [name] MRN: [id number] DOB: 10-07-1940 Today's Date: 01/07/2023   History of Present Illness Pt is an 82 y/o M admitted on 01/07/23 after presenting with c/o of AMS. Pt is being treated for UTI. PMH: schizophrenia, dementia, HTN, Hep C, hearing loss, BPH   Clinical Impression   Unsure of baseline cog, but pt all over the place with tangenital speech (bounces from topic to topic). Although his cognition seems impaired he was Supervision for room level ambulation and demonstrates his ability to process ADLs appropriately when desired. Session was limited by pt's desire to eat lunch as it arrived swiftly after walking to doorway. OT to continue following pt acutely to ensure safe DC to next level of care. Patient would benefit from post acute Home OT services to help maximize functional independence in natural environment       If plan is discharge home, recommend the following: Supervision due to cognitive status;Direct supervision/assist for medications management;Direct supervision/assist for financial management    Functional Status Assessment  Patient has had a recent decline in their functional status and/or demonstrates limited ability to make significant improvements in function in a reasonable and predictable amount of time  Equipment Recommendations  None recommended by OT (unsure of home DME)    Recommendations for Other Services       Precautions / Restrictions Precautions Precautions: Fall Restrictions Weight Bearing Restrictions Per Provider Order: No      Mobility Bed Mobility               General bed mobility comments: not tested, pt received in recliner, left sitting EOB    Transfers Overall transfer level: Needs assistance Equipment used: Rolling walker (2 wheels) Transfers: Sit to/from Stand Sit to Stand: Supervision           General transfer comment: STS from recliner with RW       Balance Overall balance assessment: Needs assistance Sitting-balance support: Feet supported Sitting balance-Leahy Scale: Good     Standing balance support: During functional activity, Reliant on assistive device for balance, Bilateral upper extremity supported Standing balance-Leahy Scale: Fair                             ADL either performed or assessed with clinical judgement   ADL Overall ADL's : Needs assistance/impaired Eating/Feeding: Independent;Sitting   Grooming: Set up;Sitting   Upper Body Bathing: Set up;Sitting   Lower Body Bathing: Supervison/ safety;Sitting/lateral leans   Upper Body Dressing : Supervision/safety;Set up;Standing   Lower Body Dressing: Sitting/lateral leans;Set up;Supervision/safety   Toilet Transfer: Contact guard assist;Rolling walker (2 wheels)   Toileting- Clothing Manipulation and Hygiene: Contact guard assist;Sit to/from stand       Functional mobility during ADLs: Supervision/safety;Rolling walker (2 wheels)       Vision         Perception         Praxis         Pertinent Vitals/Pain Pain Assessment Pain Assessment: No/denies pain     Extremity/Trunk Assessment Upper Extremity Assessment Upper Extremity Assessment: Overall WFL for tasks assessed   Lower Extremity Assessment Lower Extremity Assessment: Overall WFL for tasks assessed;Generalized weakness (Genu varus?)       Communication Communication Communication: Hearing impairment Cueing Techniques: Verbal cues;Gestural cues   Cognition Arousal: Alert Behavior During Therapy: Impulsive Overall Cognitive Status: No family/caregiver present to determine baseline cognitive functioning Area of Impairment: Orientation,  Attention, Memory, Following commands, Safety/judgement, Awareness                 Orientation Level: Disoriented to, Situation   Memory: Decreased short-term memory Following Commands: Follows one step commands  inconsistently, Follows one step commands with increased time Safety/Judgement: Decreased awareness of safety, Decreased awareness of deficits Awareness: Intellectual   General Comments: Pt is oriented to self, location, & time but not situation. Pt very tangenital with speech     General Comments       Exercises     Shoulder Instructions      Home Living Family/patient expects to be discharged to:: Private residence Living Arrangements:  (sister) Available Help at Discharge: Family Type of Home: House Home Access: Level entry     Home Layout: One level               Home Equipment: Agricultural consultant (2 wheels)   Additional Comments: Pt speaking as if he's homeless but per chart & case Research officer, political party pt lives with sister. Pt reports he uses walker, unable to provide bathroom set up.      Prior Functioning/Environment               Mobility Comments: Ambulatory with RW ADLs Comments: unsure        OT Problem List: Decreased cognition      OT Treatment/Interventions: Self-care/ADL training;Therapeutic exercise;Balance training;Therapeutic activities;Cognitive remediation/compensation;Patient/family education    OT Goals(Current goals can be found in the care plan section) Acute Rehab OT Goals Patient Stated Goal: none stated OT Goal Formulation: Patient unable to participate in goal setting Time For Goal Achievement: 01/21/23 Potential to Achieve Goals: Good ADL Goals Pt Will Perform Grooming: with supervision;standing Pt Will Perform Lower Body Dressing: with supervision;sit to/from stand Pt Will Transfer to Toilet: with supervision;ambulating Additional ADL Goal #2: Pt will stay engaged on simple ADL tasks throughout therapy session at least 75% of the time to show improved attention  OT Frequency: Min 1X/week    Co-evaluation              AM-PAC OT "6 Clicks" Daily Activity     Outcome Measure Help from another person eating meals?:  None Help from another person taking care of personal grooming?: A Little Help from another person toileting, which includes using toliet, bedpan, or urinal?: A Little Help from another person bathing (including washing, rinsing, drying)?: A Little Help from another person to put on and taking off regular upper body clothing?: A Little Help from another person to put on and taking off regular lower body clothing?: A Little 6 Click Score: 19   End of Session Equipment Utilized During Treatment: Rolling walker (2 wheels) Nurse Communication: Mobility status  Activity Tolerance: Patient tolerated treatment well Patient left: in bed;with call bell/phone within reach;with bed alarm set (sitting EOB eating lunch)  OT Visit Diagnosis: Other symptoms and signs involving cognitive function                Time: 9528-4132 OT Time Calculation (min): 9 min Charges:  OT General Charges $OT Visit: 1 Visit OT Evaluation $OT Eval Low Complexity: 1 Low  01/07/2023  AB, OTR/L  Acute Rehabilitation Services  Office: [phone]   Tristan Schroeder 01/07/2023, 1:05 PM

## 2023-01-07 NOTE — Progress Notes (Signed)
Daily Progress Note Intern Pager: [phone]  Patient name: [name] Medical record number: [id number] Date of birth: August 24, 1940 Age: 82 y.o. Gender: male  Primary Care Provider: Patient, No Pcp Per Consultants: None Code Status: Full  Pt Overview and Major Events to Date:  12/13: Admitted  Assessment and Plan:  [name] is an 82 yo male presenting with AMS reported by his sister on admission in the setting of questionable UTI. Patient has orientation to self and situation, but has some questionable expectations about course of treatment. Have thus far been unsuccessful in contacting family to obtain collateral information about baseline mental status and safe dispo.  Assessment & Plan AMS (altered mental status) Confusion likely secondary to untreated schizophrenia and underlying dementia. Without collateral info, difficult to assess baseline, but mentation appears intact with respect to history -Attempt to obtain collateral information from family -PT/OT- discharge imminent -SW/TOC consulted to plan safe dispo -could consider psych if needed Hypokalemia K 3.1, repleted again -AM BMP and Mag if patient not discharged UTI (urinary tract infection) UA will not reflex to culture as it appeared contaminated/not clean catch on microscopy. Will treat empirically with po abx -cefadroxil 500 mg, BID for 4 days  Chronic and Stable Problems:  HTN: supposed to be on hydrochlorothiazide 12.5 mg, not taking. Recommend outpatient follow up  FEN/GI: heart healthy PPx: lovenox Dispo: Pending SW/TOC recommendations     .   Subjective:  Patient awake sitting up in bed on exam today.  He reports that he understands he is here being treated for his UTI and concern for his mental status.  He inquired whether we had been able to speak with his sister today.  He is still preoccupied with "trying to get to [facility] to the [facility] psych hospital".  He requested to have a bill from the hospital  in hand before discharge.  I informed the patient that I did not know if that was something we could do.  Objective: Temp:  [98 F (36.7 C)-98.8 F (37.1 C)] 98.4 F (36.9 C) (12/13 0529) Pulse Rate:  [48-112] 112 (12/13 0817) Resp:  [17-21] 18 (12/13 0817) BP: (125-177)/(81-113) 125/81 (12/13 0817) SpO2:  [93 %-100 %] 98 % (12/13 0817) Physical Exam: General: Alert, oriented to self, place and situation did not assess time Cardiovascular: RRR, no M/R/G Respiratory: CTAB, no increased work of breathing Abdomen: Flat, soft, nontender Extremities: 2+ pulses in all 4 extremities moves all 4 extremities appropriately. Mood: Appropriate affect.  Somewhat rambling speech, mentation eventually moves in a straightforward manner but patient does have several cyclical reasoning points come up regularly.  Laboratory: Most recent CBC Lab Results  Component Value Date   WBC 9.9 01/07/2023   HGB 13.3 01/07/2023   HCT 39.2 01/07/2023   MCV 85.6 01/07/2023   PLT 243 01/07/2023   Most recent BMP    Latest Ref Rng & Units 01/07/2023    7:02 AM  BMP  Glucose 70 - 99 mg/dL 147   BUN 8 - 23 mg/dL 8   Creatinine 8.29 - 5.62 mg/dL 1.30   Sodium 865 - 784 mmol/L 141   Potassium 3.5 - 5.1 mmol/L 3.1   Chloride 98 - 111 mmol/L 105   CO2 22 - 32 mmol/L 28   Calcium 8.9 - 10.3 mg/dL 9.2     [name], DO 01/07/2023, 12:09 PM  PGY-1, [facility] FPTS Intern pager: [phone], text pages welcome Secure chat group [facility] Teaching Service

## 2023-01-07 NOTE — Plan of Care (Addendum)
FMTS Brief Progress Note  S:Went bedside to see patient. SW had messaged provider that they were unable to contact relative of patient. Discussed care this admission with patient and plans for discharge. Patient wanting to go to [facility]. Discussed patient has no medical/pschiatric need to be in any hospital, and thus we would likely send him home. Patient wanting to contact veteran services/resources. Encouraged patient to call his resources and that I would stop back by to discuss care plan.    O: BP 125/81   Pulse (!) 112   Temp 98.4 F (36.9 C)   Resp 18   Ht 5\' 8"  (1.727 m)   Wt 76 kg   SpO2 98%   BMI 25.48 kg/m   General: NAD, conversant, good mood Neuro: A&O x 3, no auditory/visual hallucinations  A/P: UTI and AMS Patient comes in for AMS, but appears to be at baseline? Patient alert and oriented x 3, with good understanding of why he was in hospital, and plan for treatment. Patient appreciates that he has UTI and needs to take abx for it. He feels fine to swallow pills and continue abx. Patient also reports he's in a good mood, and denies any auditory/visual hallucinations. Given no need for hospitalization or psychiatric hospitalization, we have no reason to keep patient admitted. Plan to have PT/OT evaluate, if stable, will plan to d/c back home. SW and providers unable to contact relative in chart, despite multiple attempts.  - PT/OT eval and treat - Continue abx - D/c home when cleared by PT/OT  Addendum 2:06 pm  Spoke with [facility] attending Dr. Denny Levy. Given our in abillity to reach family, we do not know if patient has safe discharge. Will likely have to keep patient admitted until we can determine safe dispo. Will also await PT Cathie Beams, MD 01/07/2023, 1:49 PM PGY-3, [facility] Night Resident  Please page [phone] with questions.

## 2023-01-07 NOTE — Assessment & Plan Note (Addendum)
UA will not reflex to culture as it appeared contaminated/not clean catch on microscopy. Will treat empirically with po abx -cefadroxil 500 mg, BID for 4 days

## 2023-01-07 NOTE — Discharge Instructions (Addendum)
Dear [name],   Thank you for letting us participate in your care! In this section, you will find a brief hospital admission summary of why you were admitted to the hospital, what happened during your admission, your diagnosis/diagnoses, and recommended follow up.  You were diagnosed with a urinary tract infection and given antibiotics for treatment, then started on Flomax for a feeling of urinary urgency.  [facility] as [facility] may be able to assist you in getting to the psychiatric hospital in [facility].  POST-HOSPITAL & CARE INSTRUCTIONS You are scheduled to follow up with [place] at the [facility] on Friday 12/27. Please let PCP/Specialists know of any changes in medications that were made which you will be able to see in the medications section of this packet. Continue taking Haldol 10 mg twice daily for 2 wks. Follow up at [facility] for 2nd shot in a week (01/25/22).  [facility] also [facility] for [facility]  FOLLOW-UP APPOINTMENTS Future Appointments  Date Time Provider Department Center  01/21/2023  3:10 PM Lockie Mola, MD [facility]   Thank you for choosing [facility] Take care and be well!  Family Medicine Teaching Service Inpatient Team [place]  [facility]  [address] [place], [place] [address] [phone]    ----------------------------- ACT TEAM INFORMATION  For your behavioral health needs, you are advised to follow up with an outpatient provider.  You may be eligible for ACT Team services, which would include more frequent visits with your provider, as well as in-home services.  The following providers offer ACT Team services.  Contact them at your earliest opportunity to ask about enrolling in their program:       [facility]      [address]      [place], [place] [address]      [phone] [address]      [place], [place] [address]      [phone]       [facility]      [address]      [place], [place] [address]      [phone]       [facility] ACT Team      [name], [address]      [address]      [place], [place]  [address]      [phone]       [facility]      [address]      [place], [place] [address]      [phone]

## 2023-01-07 NOTE — Assessment & Plan Note (Addendum)
Confusion likely secondary to untreated schizophrenia and underlying dementia. Without collateral info, difficult to assess baseline, but mentation appears intact with respect to history -Attempt to obtain collateral information from family -PT/OT- discharge imminent -SW/TOC consulted to plan safe dispo -could consider psych if needed

## 2023-01-08 DIAGNOSIS — E876 Hypokalemia: Secondary | ICD-10-CM

## 2023-01-08 DIAGNOSIS — F209 Schizophrenia, unspecified: Secondary | ICD-10-CM

## 2023-01-08 LAB — CBC
HCT: 38.4 % — ABNORMAL LOW (ref 39.0–52.0)
Hemoglobin: 12.7 g/dL — ABNORMAL LOW (ref 13.0–17.0)
MCH: 28.4 pg (ref 26.0–34.0)
MCHC: 33.1 g/dL (ref 30.0–36.0)
MCV: 85.9 fL (ref 80.0–100.0)
Platelets: 259 10*3/uL (ref 150–400)
RBC: 4.47 MIL/uL (ref 4.22–5.81)
RDW: 14.3 % (ref 11.5–15.5)
WBC: 8.8 10*3/uL (ref 4.0–10.5)
nRBC: 0 % (ref 0.0–0.2)

## 2023-01-08 LAB — BASIC METABOLIC PANEL
Anion gap: 4 — ABNORMAL LOW (ref 5–15)
BUN: 12 mg/dL (ref 8–23)
CO2: 24 mmol/L (ref 22–32)
Calcium: 8.7 mg/dL — ABNORMAL LOW (ref 8.9–10.3)
Chloride: 106 mmol/L (ref 98–111)
Creatinine, Ser: 0.8 mg/dL (ref 0.61–1.24)
GFR, Estimated: >60 mL/min (ref >60)
Glucose, Bld: 97 mg/dL (ref 70–99)
Potassium: 3.1 mmol/L — ABNORMAL LOW (ref 3.5–5.1)
Sodium: 134 mmol/L — ABNORMAL LOW (ref 135–145)

## 2023-01-08 LAB — MAGNESIUM: Magnesium: 1.8 mg/dL (ref 1.7–2.4)

## 2023-01-08 MED ORDER — POTASSIUM CHLORIDE CRYS ER 20 MEQ PO TBCR
40.0000 meq | EXTENDED_RELEASE_TABLET | Freq: Once | ORAL | Status: AC
  Administered 2023-01-08: 40 meq via ORAL
  Filled 2023-01-08: qty 2

## 2023-01-08 MED ORDER — HYDROCHLOROTHIAZIDE 12.5 MG PO TABS
12.5000 mg | ORAL_TABLET | Freq: Every day | ORAL | Status: DC
  Administered 2023-01-08 – 2023-01-19 (×12): 12.5 mg via ORAL
  Filled 2023-01-08 (×12): qty 1
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
## 2023-01-08 NOTE — Progress Notes (Signed)
Mobility Specialist Progress Note;   01/08/23 1040  Mobility  Activity Ambulated with assistance in hallway  Level of Assistance Contact guard assist, steadying assist  Assistive Device Front wheel walker  Distance Ambulated (ft) 100 ft  Activity Response Tolerated well  Mobility Referral Yes  Mobility visit 1 Mobility  Mobility Specialist Start Time (ACUTE ONLY) 1040  Mobility Specialist Stop Time (ACUTE ONLY) 1050  Mobility Specialist Time Calculation (min) (ACUTE ONLY) 10 min   Pt agreeable to mobility. Required MinG assistance during ambulation for safety. Pt seemed a bit HOH during session, requiring to repeat questions when asked. Asx throughout session and no c/o. Pt returned back to EOB with all needs met.   Caesar Bookman Mobility Specialist Please contact via SecureChat or [employer] [phone]

## 2023-01-08 NOTE — Assessment & Plan Note (Addendum)
Confusion likely secondary to untreated schizophrenia and underlying dementia.  Without collateral info, difficult to assess baseline, but mentation appears intact with respect to history.  APS case filed. -TOC consulted for placement and to follow with APS

## 2023-01-08 NOTE — Assessment & Plan Note (Signed)
Not on any medications outpatient, reports hallucinations.  Likely near baseline. -Psych consulted, follow up recommendations and appreciate assistance

## 2023-01-08 NOTE — Assessment & Plan Note (Addendum)
K 3.1, repleted again. -AM BMP and Mag if patient not discharged

## 2023-01-08 NOTE — Plan of Care (Signed)
  Problem: Education: Goal: Knowledge of General Education information will improve Description: Including pain rating scale, medication(s)/side effects and non-pharmacologic comfort measures Outcome: Not Progressing   Problem: Health Behavior/Discharge Planning: Goal: Ability to manage health-related needs will improve Outcome: Not Progressing   Problem: Clinical Measurements: Goal: Ability to maintain clinical measurements within normal limits will improve Outcome: Not Progressing Goal: Will remain free from infection Outcome: Not Progressing Goal: Diagnostic test results will improve Outcome: Not Progressing Goal: Respiratory complications will improve Outcome: Not Progressing Goal: Cardiovascular complication will be avoided Outcome: Not Progressing   Problem: Activity: Goal: Risk for activity intolerance will decrease Outcome: Not Progressing   Problem: Nutrition: Goal: Adequate nutrition will be maintained Outcome: Not Progressing   Problem: Elimination: Goal: Will not experience complications related to bowel motility Outcome: Not Progressing Goal: Will not experience complications related to urinary retention Outcome: Not Progressing   Problem: Coping: Goal: Level of anxiety will decrease Outcome: Not Progressing   Problem: Pain Management: Goal: General experience of comfort will improve Outcome: Not Progressing   Problem: Safety: Goal: Ability to remain free from injury will improve Outcome: Not Progressing   Problem: Skin Integrity: Goal: Risk for impaired skin integrity will decrease Outcome: Not Progressing

## 2023-01-08 NOTE — Assessment & Plan Note (Signed)
Likely hemodilutional given that RBC and WBC decreased simultaneously.  Will trend. -AM CBC

## 2023-01-08 NOTE — Assessment & Plan Note (Addendum)
UA will not reflex to culture as it appeared contaminated/not clean catch on microscopy.  Continuing empiric treatment. -Cefadroxil 500 mg BID (day 3 of 4)

## 2023-01-09 DIAGNOSIS — E876 Hypokalemia: Secondary | ICD-10-CM | POA: Diagnosis not present

## 2023-01-09 DIAGNOSIS — F2089 Other schizophrenia: Secondary | ICD-10-CM

## 2023-01-09 DIAGNOSIS — R4182 Altered mental status, unspecified: Secondary | ICD-10-CM | POA: Diagnosis not present

## 2023-01-09 MED ORDER — HALOPERIDOL LACTATE 5 MG/ML IJ SOLN
2.0000 mg | Freq: Every day | INTRAMUSCULAR | Status: DC | PRN
  Administered 2023-01-14: 2 mg via INTRAMUSCULAR
  Filled 2023-01-09: qty 1

## 2023-01-09 MED ORDER — HALOPERIDOL 5 MG PO TABS
5.0000 mg | ORAL_TABLET | Freq: Every day | ORAL | Status: DC
  Administered 2023-01-09 – 2023-01-10 (×2): 5 mg via ORAL
  Filled 2023-01-09 (×2): qty 1

## 2023-01-09 MED ORDER — HALOPERIDOL 5 MG PO TABS
5.0000 mg | ORAL_TABLET | Freq: Every day | ORAL | Status: DC | PRN
  Administered 2023-01-10: 5 mg via ORAL
  Filled 2023-01-09 (×2): qty 1

## 2023-01-09 NOTE — Assessment & Plan Note (Addendum)
Patient continues to be at baseline observed here in the hospital.  Still unable to assess true baseline due to lack of collateral. -TOC consulted for placement and to follow with APS

## 2023-01-09 NOTE — Progress Notes (Signed)
Patient refused and is adamant on not getting blood drawn.  Garwin Brothers RN

## 2023-01-09 NOTE — Assessment & Plan Note (Addendum)
K 3.1 yesterday, patient refused blood draw today due to confusion over antibiotic will try to reorient and reassure.  Given his stable kidney function could give low-dose potassium preemptively. -AM BMP and Mag
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
## 2023-01-09 NOTE — Plan of Care (Signed)
  Problem: Clinical Measurements: Goal: Respiratory complications will improve Outcome: Progressing Goal: Cardiovascular complication will be avoided Outcome: Progressing   Problem: Activity: Goal: Risk for activity intolerance will decrease Outcome: Progressing   Problem: Nutrition: Goal: Adequate nutrition will be maintained Outcome: Progressing   Problem: Elimination: Goal: Will not experience complications related to bowel motility Outcome: Progressing Goal: Will not experience complications related to urinary retention Outcome: Progressing   
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
## 2023-01-09 NOTE — Progress Notes (Signed)
Mobility Specialist Progress Note;   01/09/23 1044  Mobility  Activity Refused mobility   Pt refusing mobility at this time w/ encouragement d/t tender/sore feet. Will f/u as schedule permits. Pt left sitting on EOB w/ all needs met.  Caesar Bookman Mobility Specialist Please contact via SecureChat or [employer] [phone]

## 2023-01-09 NOTE — Assessment & Plan Note (Addendum)
Continue with empiric treatment.  Will need to discuss with patient that we have not stopped his antibiotic he has just completed the course. -Cefadroxil 500 mg BID (day 4 of 4)

## 2023-01-09 NOTE — Assessment & Plan Note (Addendum)
Acute decompensation on exam today. Seen with Dr. Gasper Sells, will procede with IVC for possible transfer to the [facility]. -Psych consulted, follow up recommendations and appreciate assistance

## 2023-01-09 NOTE — Assessment & Plan Note (Addendum)
Could not trend due to patient refusal and subsequent agitation, will reattempt later in the day. -AM CBC

## 2023-01-09 NOTE — Plan of Care (Signed)
?  Problem: Clinical Measurements: ?Goal: Will remain free from infection ?Outcome: Progressing ?  ?

## 2023-01-09 NOTE — Progress Notes (Addendum)
CSW uploaded patient's IVC to [facility]-[employer] - envelope [id number], case #[id number].  Copies will be made and placed into patient's chart.  Edwin Dada, MSW, LCSW Transitions of Care  Clinical Social Worker II [phone]

## 2023-01-10 DIAGNOSIS — F2089 Other schizophrenia: Secondary | ICD-10-CM | POA: Diagnosis not present

## 2023-01-10 DIAGNOSIS — E876 Hypokalemia: Secondary | ICD-10-CM | POA: Diagnosis not present

## 2023-01-10 DIAGNOSIS — R4182 Altered mental status, unspecified: Secondary | ICD-10-CM | POA: Diagnosis not present

## 2023-01-10 LAB — CBC
HCT: 40.6 % (ref 39.0–52.0)
Hemoglobin: 13.3 g/dL (ref 13.0–17.0)
MCH: 28.3 pg (ref 26.0–34.0)
MCHC: 32.8 g/dL (ref 30.0–36.0)
MCV: 86.4 fL (ref 80.0–100.0)
Platelets: 338 10*3/uL (ref 150–400)
RBC: 4.7 MIL/uL (ref 4.22–5.81)
RDW: 14 % (ref 11.5–15.5)
WBC: 6 10*3/uL (ref 4.0–10.5)
nRBC: 0 % (ref 0.0–0.2)

## 2023-01-10 LAB — BASIC METABOLIC PANEL
Anion gap: 10 (ref 5–15)
BUN: 16 mg/dL (ref 8–23)
CO2: 24 mmol/L (ref 22–32)
Calcium: 9.7 mg/dL (ref 8.9–10.3)
Chloride: 102 mmol/L (ref 98–111)
Creatinine, Ser: 1.11 mg/dL (ref 0.61–1.24)
GFR, Estimated: >60 mL/min (ref >60)
Glucose, Bld: 144 mg/dL — ABNORMAL HIGH (ref 70–99)
Potassium: 3.6 mmol/L (ref 3.5–5.1)
Sodium: 136 mmol/L (ref 135–145)

## 2023-01-10 LAB — MAGNESIUM: Magnesium: 1.9 mg/dL (ref 1.7–2.4)

## 2023-01-10 NOTE — Assessment & Plan Note (Addendum)
Finally able to obtain labs this morning. K is stable at 3.6 -AM BMP and Mag

## 2023-01-10 NOTE — Plan of Care (Signed)
  Problem: Education: Goal: Knowledge of General Education information will improve Description Including pain rating scale, medication(s)/side effects and non-pharmacologic comfort measures Outcome: Progressing   

## 2023-01-10 NOTE — Progress Notes (Signed)
Refused labs. Patient was getting agitated.
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
## 2023-01-10 NOTE — Progress Notes (Signed)
Daily Progress Note Intern Pager: [phone]  Patient name: [name] Medical record number: [id number] Date of birth: 1940-11-14 Age: 82 y.o. Gender: male  Primary Care Provider: Patient, No Pcp Per Consultants: Psychiatry Code Status: Full  Pt Overview and Major Events to Date:  12/13 admitted 12/14 psychiatry consult 12/15: IVC initiated  Assessment and Plan:  [name] is an 82 year old man with a past medical history of schizophrenia which has been untreated for an indeterminate amount of time presenting from home with reported altered mental status.  He has been treated for his UTI appropriately with antibiotics, but continues to refuse laboratory testing and examination by medical staff.  Psychiatry has been consulted and is following.  Patient is currently under IVC and is medically stable. Assessment & Plan Schizophrenia [facility]) Patient continues to fixate on legal issues as reported by self.  He refused to have medical examination done as I was "not helping him with his 2 false convictions".  He also continues to talk about the pyramids in a rather circular fashion and become agitated when medical staff is interacting with him. -Psychiatry following, appreciate recommendations -Daily Haldol 5 mg  As needed Haldol 5 mg by mouth 2 mg IM for agitation -Psychiatry is reach out to the [facility] for further medical collateral UTI (urinary tract infection) Appropriately treated.  From a medical standpoint we do not believe this has contributed to his altered mental status.  He successfully treated a course of antibiotics appropriate for his infection and throughout treatment his mental status worsened. Hypokalemia Finally able to obtain labs this morning. K is stable at 3.6 -AM BMP and Mag  Anemia Labs obtained CBC stable. -AM CBC  Chronic and Stable Problems:  HTN: HCTZ 12.5 mg   FEN/GI: Heart healthy PPx: Lovenox Dispo: IVC  pending clinical improvement .   Subjective:  Patient was awake sitting up in the chair with breakfast tray in front of him appeared untouched and history close.  Attempted to speak to the patient regarding how he was doing this morning and he expressed quite vehemently that he would not speak to anyone unless he could have his "two false charges brought before a judge for hearing".  Patient does appear to be hard of hearing, however whenever anyone gets closer to him or speaks louder to attempt to be heard patient interrupts, raises his voice and escalates and agitation.  At no point did the patient makes threats toward myself or other medical staff but he did make it abundantly clear that he would not cooperate with me today.  Toward the end of my attempts at exam he said "if you are not going to help me with my 2 false convictions then you should just leave" at which point I excused myself from the room.  Objective: Temp:  [98.1 F (36.7 C)] 98.1 F (36.7 C) (12/16 0859) Pulse Rate:  [78-111] 78 (12/16 0915) Resp:  [18] 18 (12/16 0859) BP: (137)/(73) 137/73 (12/16 0859) SpO2:  [100 %] 100 % (12/16 0859)  Patient repeatedly denied medical staff from performing medical exam today  Laboratory: Most recent CBC Lab Results  Component Value Date   WBC 6.0 01/10/2023   HGB 13.3 01/10/2023   HCT 40.6 01/10/2023   MCV 86.4 01/10/2023   PLT 338 01/10/2023   Most recent BMP    Latest Ref Rng & Units 01/10/2023    8:07 AM  BMP  Glucose 70 - 99 mg/dL 132   BUN  8 - 23 mg/dL 16   Creatinine 4.78 - 1.24 mg/dL 2.95   Sodium 621 - 308 mmol/L 136   Potassium 3.5 - 5.1 mmol/L 3.6   Chloride 98 - 111 mmol/L 102   CO2 22 - 32 mmol/L 24   Calcium 8.9 - 10.3 mg/dL 9.7     [name], DO 01/10/2023, 12:29 PM  PGY-1, [facility] FPTS Intern pager: [phone], text pages welcome Secure chat group [facility] Teaching Service

## 2023-01-10 NOTE — Assessment & Plan Note (Addendum)
Patient continues to fixate on legal issues as reported by self.  He refused to have medical examination done as I was "not helping him with his 2 false convictions".  He also continues to talk about the pyramids in a rather circular fashion and become agitated when medical staff is interacting with him. -Psychiatry following, appreciate recommendations -Daily Haldol 5 mg  As needed Haldol 5 mg by mouth 2 mg IM for agitation -Psychiatry is reach out to the [facility] for further medical collateral
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
## 2023-01-10 NOTE — Assessment & Plan Note (Addendum)
Labs obtained CBC stable. -AM CBC

## 2023-01-10 NOTE — Assessment & Plan Note (Addendum)
Appropriately treated.  From a medical standpoint we do not believe this has contributed to his altered mental status.  He successfully treated a course of antibiotics appropriate for his infection and throughout treatment his mental status worsened.

## 2023-01-10 NOTE — Progress Notes (Signed)
Mobility Specialist: Progress Note   01/10/23 1530  Mobility  Activity Ambulated with assistance in hallway  Level of Assistance Standby assist, set-up cues, supervision of patient - no hands on  Assistive Device Front wheel walker  Distance Ambulated (ft) 100 ft  Activity Response Tolerated well  Mobility Referral Yes  Mobility visit 1 Mobility  Mobility Specialist Start Time (ACUTE ONLY) 0847  Mobility Specialist Stop Time (ACUTE ONLY) 0855  Mobility Specialist Time Calculation (min) (ACUTE ONLY) 8 min    Pt was agreeable to mobility session - received in chair. No complaints or physical assistance needed. Returned to room without fault. Left in chair with all needs met, call bell in reach. Sitter in room.   Maurene Capes Mobility Specialist Please contact via SecureChat or Rehab office at [phone]
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
## 2023-01-11 ENCOUNTER — Encounter (HOSPITAL_COMMUNITY): Payer: Self-pay | Admitting: Family Medicine

## 2023-01-11 DIAGNOSIS — R4182 Altered mental status, unspecified: Secondary | ICD-10-CM | POA: Diagnosis not present

## 2023-01-11 DIAGNOSIS — F2089 Other schizophrenia: Secondary | ICD-10-CM | POA: Diagnosis not present

## 2023-01-11 LAB — CULTURE, BLOOD (ROUTINE X 2)
Culture: NO GROWTH
Culture: NO GROWTH
Special Requests: ADEQUATE

## 2023-01-11 LAB — BASIC METABOLIC PANEL
Anion gap: 8 (ref 5–15)
BUN: 19 mg/dL (ref 8–23)
CO2: 24 mmol/L (ref 22–32)
Calcium: 9.3 mg/dL (ref 8.9–10.3)
Chloride: 104 mmol/L (ref 98–111)
Creatinine, Ser: 1.02 mg/dL (ref 0.61–1.24)
GFR, Estimated: >60 mL/min (ref >60)
Glucose, Bld: 93 mg/dL (ref 70–99)
Potassium: 4.1 mmol/L (ref 3.5–5.1)
Sodium: 136 mmol/L (ref 135–145)

## 2023-01-11 LAB — MAGNESIUM: Magnesium: 1.9 mg/dL (ref 1.7–2.4)

## 2023-01-11 MED ORDER — HALOPERIDOL 5 MG PO TABS
10.0000 mg | ORAL_TABLET | Freq: Every day | ORAL | Status: DC
  Administered 2023-01-12 – 2023-01-16 (×5): 10 mg via ORAL
  Filled 2023-01-11 (×5): qty 2

## 2023-01-11 MED ORDER — HALOPERIDOL 5 MG PO TABS
5.0000 mg | ORAL_TABLET | Freq: Every day | ORAL | Status: AC
  Administered 2023-01-11: 5 mg via ORAL
  Filled 2023-01-11: qty 1
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
## 2023-01-11 NOTE — Progress Notes (Addendum)
     Daily Progress Note Intern Pager: [phone]  Patient name: [name] Medical record number: [id number] Date of birth: 04/23/1940 Age: 82 y.o. Gender: male  Primary Care Provider: Patient, No Pcp Per Consultants: Psychiatry Code Status: Full  Pt Overview and Major Events to Date:  12/13: Admitted 12/14: Psychiatry consult 12/15: IVC initiated  Assessment and Plan:  [name] is an 82 year old man with a past medical history of schizophrenia which has been untreated for an indeterminate amount of time presenting from home with reported altered mental status.  Medically stable. No concerns.  Awaiting transfer to [facility] for psychiatric treatment. Assessment & Plan Schizophrenia (HCC) Waxing and waning agitation. Was cooperative today.  -Psychiatry following, appreciate recommendations -Daily Haldol 5 mg  As needed Haldol 5 mg by mouth 2 mg IM for agitation -Psychiatry is reach out to the [facility] for further medical collateral UTI (urinary tract infection) (Resolved: 01/11/2023) Appropriately treated.  From a medical standpoint we do not believe this has contributed to his altered mental status.  He successfully treated a course of antibiotics appropriate for his infection and throughout treatment his mental status worsened. Hypokalemia (Resolved: 01/11/2023) Finally able to obtain labs this morning. K is stable at 3.6 -AM BMP and Mag  Anemia (Resolved: 01/11/2023) Labs obtained CBC stable. -AM CBC  Chronic and Stable Problems:  HTN: HCTZ 12.5 mg   FEN/GI: Heart healthy PPx: Lovenox Dispo: IVC  pending transfer to [facility] .    Subjective:  Patient was awake sitting up in his chair with breakfast tray in front of him.  He was fully clothed in his street clothes.  Is amenable to physical exam today and had no complaints of pain or discomfort.  Objective: Temp:  [98.1 F (36.7 C)-98.2 F (36.8 C)] 98.2 F (36.8 C) (12/16 1846) Pulse Rate:   [51-111] 51 (12/16 1846) Resp:  [18] 18 (12/16 1846) BP: (136-137)/(73-79) 136/79 (12/16 1846) SpO2:  [100 %] 100 % (12/16 1846) Physical Exam: General: Disheveled but well-appearing male no distress Cardiovascular: RRR, no M/R/G Respiratory: CTAB, no increased work of breathing Extremities: 2+ pulses x 4, no evidence of peripheral edema  Laboratory: Most recent CBC Lab Results  Component Value Date   WBC 6.0 01/10/2023   HGB 13.3 01/10/2023   HCT 40.6 01/10/2023   MCV 86.4 01/10/2023   PLT 338 01/10/2023   Most recent BMP    Latest Ref Rng & Units 01/11/2023    3:15 AM  BMP  Glucose 70 - 99 mg/dL 93   BUN 8 - 23 mg/dL 19   Creatinine 1.30 - 1.24 mg/dL 8.65   Sodium 784 - 696 mmol/L 136   Potassium 3.5 - 5.1 mmol/L 4.1   Chloride 98 - 111 mmol/L 104   CO2 22 - 32 mmol/L 24   Calcium 8.9 - 10.3 mg/dL 9.3    [name], DO 01/11/2023, 7:31 AM  PGY-1, [facility] FPTS Intern pager: [phone], text pages welcome Secure chat group [facility] Teaching Service
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
## 2023-01-11 NOTE — Progress Notes (Signed)
Physical Therapy Note  (Late entry for earlier today)    01/11/23 0913  PT Visit Information  Last PT Received On 01/11/23  Reason Eval/Treat Not Completed Other (comment)      Pt very restless; Nursing Leader in to talk with him;   Will follow up later today as time allows;  Otherwise, will follow up for PT tomorrow;   Thank you,  Van Clines, PT  Acute Rehabilitation Services Office [phone]

## 2023-01-11 NOTE — Assessment & Plan Note (Signed)
Appropriately treated.  From a medical standpoint we do not believe this has contributed to his altered mental status.  He successfully treated a course of antibiotics appropriate for his infection and throughout treatment his mental status worsened.
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
## 2023-01-11 NOTE — Assessment & Plan Note (Addendum)
Waxing and waning agitation. Was cooperative today.  -Psychiatry following, appreciate recommendations -Daily Haldol 5 mg  As needed Haldol 5 mg by mouth 2 mg IM for agitation -Psychiatry is reach out to the [facility] for further medical collateral

## 2023-01-11 NOTE — Progress Notes (Signed)
Mobility Specialist: Progress Note   01/11/23 0950  Mobility  Activity Ambulated with assistance in hallway  Level of Assistance Standby assist, set-up cues, supervision of patient - no hands on  Assistive Device Front wheel walker  Distance Ambulated (ft) 100 ft  Activity Response Tolerated well  Mobility Referral Yes  Mobility visit 1 Mobility  Mobility Specialist Start Time (ACUTE ONLY) [id number]  Mobility Specialist Stop Time (ACUTE ONLY) 0933  Mobility Specialist Time Calculation (min) (ACUTE ONLY) 7 min    Pt was agreeable to mobility session - received in chair. No complaints or physical assistance needed. Returned to room without fault. Left in chair with all needs met, call bell in reach. Sitter in room.   Maurene Capes Mobility Specialist Please contact via SecureChat or Rehab office at [phone]

## 2023-01-11 NOTE — TOC Progression Note (Addendum)
Transition of Care [facility]) - Progression Note    Patient Details  Name: [name] MRN: [id number] Date of Birth: [birth year]  [facility] CM/SW Contact  Erin Sons, [place] Phone Number: 01/11/2023, 9:27 AM  Clinical Narrative:     Never received [facility] transfer forms. CSW spoke with Evaristo Bury at [facility] transfer center; he will re-fax forms.   1030: CSW never received fax. Called Evaristo Bury who emailed CSW the forms. CSW will fax referral once transfer forms are signed by attending.   1520: Completed transfer forms and clinicals faxed to [phone]                 Social Determinants of Health (SDOH) Interventions SDOH Screenings   Food Insecurity: No Food Insecurity (01/07/2023)  Housing: Low Risk  (01/07/2023)  Transportation Needs: Patient Unable To Answer (01/07/2023)  Utilities: Not At Risk (01/07/2023)  Tobacco Use: Medium Risk (01/06/2023)    Readmission Risk Interventions     No data to display

## 2023-01-11 NOTE — Assessment & Plan Note (Signed)
Labs obtained CBC stable. -AM CBC

## 2023-01-11 NOTE — Assessment & Plan Note (Signed)
Finally able to obtain labs this morning. K is stable at 3.6 -AM BMP and Mag

## 2023-01-12 DIAGNOSIS — E876 Hypokalemia: Secondary | ICD-10-CM | POA: Diagnosis not present

## 2023-01-12 DIAGNOSIS — F2089 Other schizophrenia: Secondary | ICD-10-CM

## 2023-01-12 DIAGNOSIS — F209 Schizophrenia, unspecified: Secondary | ICD-10-CM | POA: Diagnosis not present
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
## 2023-01-12 NOTE — Assessment & Plan Note (Addendum)
More calm today, more cooperative.  Still discusses delusions but does not become agitated. -Psychiatry following, appreciate recommendations -Daily Haldol 10 mg  As needed Haldol 5 mg by mouth 2 mg IM for agitation -Awaiting transfer to appropriate psychiatric facility

## 2023-01-12 NOTE — Progress Notes (Signed)
Occupational Therapy Treatment Patient Details Name: [name] MRN: [id number] DOB: July 06, 1940 Today's Date: 01/12/2023   History of present illness Pt is an 82 y/o M admitted on 01/07/23 after presenting with c/o of AMS. Pt is being treated for UTI. PMH: schizophrenia, dementia, HTN, Hep C, hearing loss, BPH   OT comments  Pt needed encouragement from sitter to perform mobility. He became increasingly agitated with OT efforts to assist with tying his shoes and stated that he did not want someone walking along side him during session. Pt presenting close to baseline, based on session pt possesses the capacity to complete ADLs when he wants. OT signing off, reconsult if needed.       If plan is discharge home, recommend the following:  Supervision due to cognitive status;Direct supervision/assist for medications management;Direct supervision/assist for financial management   Equipment Recommendations  None recommended by OT    Recommendations for Other Services      Precautions / Restrictions Precautions Precautions: Fall Restrictions Weight Bearing Restrictions Per Provider Order: No       Mobility Bed Mobility               General bed mobility comments: pt received & left sitting in recliner    Transfers Overall transfer level: Needs assistance Equipment used: Rolling walker (2 wheels) Transfers: Sit to/from Stand Sit to Stand: Modified independent (Device/Increase time)           General transfer comment: STS     Balance Overall balance assessment: Needs assistance Sitting-balance support: Feet supported Sitting balance-Leahy Scale: Good     Standing balance support: During functional activity, Reliant on assistive device for balance, Bilateral upper extremity supported Standing balance-Leahy Scale: Good                             ADL either performed or assessed with clinical judgement   ADL                                               Extremity/Trunk Assessment Upper Extremity Assessment Upper Extremity Assessment: Overall WFL for tasks assessed            Vision       Perception     Praxis      Cognition Arousal: Alert Behavior During Therapy: Impulsive Overall Cognitive Status: No family/caregiver present to determine baseline cognitive functioning                                          Exercises      Shoulder Instructions       General Comments      Pertinent Vitals/ Pain       Pain Assessment Pain Assessment: No/denies pain  Home Living                                          Prior Functioning/Environment              Frequency  Min 1X/week        Progress Toward Goals  OT Goals(current goals can now be found in the  care plan section)  Progress towards OT goals: Goals met/education completed, patient discharged from OT  Acute Rehab OT Goals OT Goal Formulation: All assessment and education complete, DC therapy Time For Goal Achievement: 01/21/23 Potential to Achieve Goals: Good  Plan      Co-evaluation                 AM-PAC OT "6 Clicks" Daily Activity     Outcome Measure   Help from another person eating meals?: None Help from another person taking care of personal grooming?: A Little Help from another person toileting, which includes using toliet, bedpan, or urinal?: A Little Help from another person bathing (including washing, rinsing, drying)?: A Little Help from another person to put on and taking off regular upper body clothing?: A Little Help from another person to put on and taking off regular lower body clothing?: A Little 6 Click Score: 19    End of Session Equipment Utilized During Treatment: Rolling walker (2 wheels)  OT Visit Diagnosis: Other symptoms and signs involving cognitive function   Activity Tolerance Patient tolerated treatment well   Patient Left in chair;with call  bell/phone within reach;with nursing/sitter in room   Nurse Communication Mobility status        Time: 2841-3244 OT Time Calculation (min): 14 min  Charges: OT General Charges $OT Visit: 1 Visit OT Treatments $Therapeutic Activity: 8-22 mins  01/12/2023  AB, OTR/L  Acute Rehabilitation Services  Office: [phone]   Tristan Schroeder 01/12/2023, 5:16 PM

## 2023-01-12 NOTE — Plan of Care (Signed)
  Problem: Activity: Goal: Risk for activity intolerance will decrease Outcome: Progressing   Problem: Nutrition: Goal: Adequate nutrition will be maintained Outcome: Progressing   
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
## 2023-01-12 NOTE — TOC Progression Note (Signed)
Transition of Care [facility]) - Progression Note    Patient Details  Name: [name] MRN: [id number] Date of Birth: 11/12/1940  Transition of Care [facility]) CM/SW Contact  Carley Hammed, LCSW Phone Number: 01/12/2023, 10:40 AM  Clinical Narrative:    CSW followed up with [facility] IP psych rep, Evaristo Bury who states he never received pt transfer forms. Previous CSW sent them yesterday and will send them again. Rep email is [email or web address] if fax does not go through. Per [place], [facility] psych is currently diverting pt's as they have had an influx of admits through the ED. CSW advised team of this and sent out referrals to additional psych facilities for review. TOC will continue to follow. [facility]  [facility] [place]  [facility] [place] [employer] [facility] [place] [facility]        Expected Discharge Plan and Services                                               Social Determinants of Health (SDOH) Interventions SDOH Screenings   Food Insecurity: No Food Insecurity (01/07/2023)  Housing: Low Risk  (01/07/2023)  Transportation Needs: Patient Unable To Answer (01/07/2023)  Utilities: Not At Risk (01/07/2023)  Tobacco Use: Medium Risk (01/06/2023)    Readmission Risk Interventions     No data to display

## 2023-01-12 NOTE — Progress Notes (Signed)
Received call from Jacquelynn Cree. She said they have a bed for the patient at [facility]. Shitarev,MD  made aware as patient does not have any paper works. SW on call also made aware. Per MD,Mahmood, dayshift will take care of paperwork. Spoke with Jacquelynn Cree again  regarding patient discharging tomorrow. She then called [facility] (since she is calling remote) who confirmed to her that they can hold the bed for 24 hours. MD and SW made aware. Day shift RN to call [phone] to give report when patient is ready to discharge.

## 2023-01-12 NOTE — Assessment & Plan Note (Signed)
Patient continues to be at baseline observed here in the hospital.  Still unable to assess true baseline due to lack of collateral. -TOC consulted for placement and to follow with APS

## 2023-01-12 NOTE — Progress Notes (Signed)
     Daily Progress Note Intern Pager: [phone]  Patient name: [name] Medical record number: [id number] Date of birth: 08-Nov-1940 Age: 82 y.o. Gender: male  Primary Care Provider: Patient, No Pcp Per Consultants: Psychiatry Code Status: Full  Pt Overview and Major Events to Date:  12/13: Admitted 12/14: Psychiatry consult 12/15: IVC initiated  Assessment and Plan:  [name] is an 82 year old man with a past medical history of schizophrenia which has been untreated for an indeterminate amount of time presenting from home with reported altered mental status.  He is medically stable with no concerns, awaiting transfer to psychiatric facility. Assessment & Plan Schizophrenia (HCC) More calm today, more cooperative.  Still discusses delusions but does not become agitated. -Psychiatry following, appreciate recommendations -Daily Haldol 10 mg  As needed Haldol 5 mg by mouth 2 mg IM for agitation -Awaiting transfer to appropriate psychiatric facility AMS (altered mental status) Patient continues to be at baseline observed here in the hospital.  Still unable to assess true baseline due to lack of collateral. -TOC consulted for placement and to follow with APS  Chronic and Stable Problems:  Hypertension: HCTZ 12.5 mg  FEN/GI: Heart healthy PPx: Lovenox Dispo: [facility]   pending acceptance .    Subjective:  Patient awake sitting in his chair eating his breakfast on exam this morning.  He did ask about billing again and I politely informed him that I did not handle that but I would reach out to the appropriate people with his concerns.  We discussed the reason for his Lovenox and he seemed amenable to it today so he was able to get his dose of Lovenox.  Objective: Temp:  [98.2 F (36.8 C)-98.5 F (36.9 C)] 98.5 F (36.9 C) (12/17 1832) Pulse Rate:  [86-92] 92 (12/17 1832) Resp:  [18] 18 (12/17 1832) BP: (126-134)/(80-91) 134/80 (12/17 1832) SpO2:  [100 %] 100 %  (12/17 1832) Physical Exam: General: Well-appearing but disheveled elderly gentleman Cardiovascular: RRR, no M/R/G Respiratory: CTAB, no increased work of breathing Extremities: 2+ peripheral pulses in all 4 extremities  Laboratory: Most recent CBC Lab Results  Component Value Date   WBC 6.0 01/10/2023   HGB 13.3 01/10/2023   HCT 40.6 01/10/2023   MCV 86.4 01/10/2023   PLT 338 01/10/2023   Most recent BMP    Latest Ref Rng & Units 01/11/2023    3:15 AM  BMP  Glucose 70 - 99 mg/dL 93   BUN 8 - 23 mg/dL 19   Creatinine 3.66 - 1.24 mg/dL 4.40   Sodium 347 - 425 mmol/L 136   Potassium 3.5 - 5.1 mmol/L 4.1   Chloride 98 - 111 mmol/L 104   CO2 22 - 32 mmol/L 24   Calcium 8.9 - 10.3 mg/dL 9.3     Adrian Heck, DO 01/12/2023, 7:46 AM  PGY-1, [facility] FPTS Intern pager: [phone], text pages welcome Secure chat group [facility] Teaching Service

## 2023-01-13 ENCOUNTER — Inpatient Hospital Stay (HOSPITAL_COMMUNITY): Payer: No Typology Code available for payment source

## 2023-01-13 ENCOUNTER — Other Ambulatory Visit (HOSPITAL_COMMUNITY): Payer: Self-pay

## 2023-01-13 DIAGNOSIS — M7989 Other specified soft tissue disorders: Secondary | ICD-10-CM | POA: Diagnosis not present

## 2023-01-13 DIAGNOSIS — R4182 Altered mental status, unspecified: Secondary | ICD-10-CM

## 2023-01-13 DIAGNOSIS — F2089 Other schizophrenia: Secondary | ICD-10-CM | POA: Diagnosis not present

## 2023-01-13 HISTORY — DX: Other specified soft tissue disorders: M79.89

## 2023-01-13 MED ORDER — HALOPERIDOL 5 MG PO TABS: 5.0000 mg | ORAL_TABLET | Freq: Every day | ORAL | Status: DC | PRN

## 2023-01-13 MED ORDER — HALOPERIDOL 5 MG PO TABS
5.0000 mg | ORAL_TABLET | Freq: Every day | ORAL | Status: DC
  Administered 2023-01-14 – 2023-01-17 (×4): 5 mg via ORAL
  Filled 2023-01-13 (×4): qty 1

## 2023-01-13 MED ORDER — HALOPERIDOL LACTATE 5 MG/ML IJ SOLN: 2.0000 mg | Freq: Every day | INTRAMUSCULAR | Status: DC | PRN

## 2023-01-13 MED ORDER — HALOPERIDOL 5 MG PO TABS: 5.0000 mg | ORAL_TABLET | Freq: Every day | ORAL | Status: DC

## 2023-01-13 MED ORDER — POTASSIUM CHLORIDE CRYS ER 10 MEQ PO TBCR
10.0000 meq | EXTENDED_RELEASE_TABLET | Freq: Two times a day (BID) | ORAL | 0 refills | Status: DC
Start: 2023-01-13 — End: 2023-01-18
  Filled 2023-01-13: qty 60, 30d supply, fill #0

## 2023-01-13 MED ORDER — HALOPERIDOL 10 MG PO TABS: 10.0000 mg | ORAL_TABLET | Freq: Every day | ORAL | Status: DC

## 2023-01-13 NOTE — Progress Notes (Signed)
     Daily Progress Note Intern Pager: [phone]  Patient name: [name] Medical record number: [id number] Date of birth: [birth year] Age: 82 y.o. Gender: male  Primary Care Provider: Patient, No Pcp Per Consultants: Psychiatry Code Status: Full  Pt Overview and Major Events to Date:  12/13: Admitted 12/14: Psychiatry consulted 12/15: IVC initiated  Assessment and Plan:  [name] is an 82 year old male presenting with reported altered mental status with a history of schizophrenia which has been untreated for an indeterminate amount of time.  He is medically stable, was to be transferred to [facility] today but bed offer fell through. Assessment & Plan Schizophrenia (HCC) Calm, cooperative. -Psychiatry following, appreciate recommendations -Daily Haldol 10 mg  As needed Haldol 5 mg by mouth 2 mg IM for agitation -Awaiting transfer to appropriate psychiatric facility Swelling of lower extremity Patient was noted to have new asymmetric swelling of the bilateral lower extremity with right being more swollen than left.  Ultrasound of the right lower extremity was ordered to rule out DVT.  Ultrasound was negative.  Resolved.  Chronic and Stable Problems:  Hypertension: HCTZ 12.5 mg  FEN/GI: Heart healthy PPx: Lovenox Dispo: Psychiatric hospital   pending bed offer .   Subjective:  Patient was sitting calmly in chair on exam today.  He reports no complaints and was understanding of testing that needed to be ordered given the new swelling he had in his leg.  He denied any pain in his right leg.  Objective: Temp:  [97.8 F (36.6 C)-98 F (36.7 C)] 98 F (36.7 C) (12/19 0053) Pulse Rate:  [68-94] 68 (12/19 0053) Resp:  [17-20] 20 (12/19 0053) BP: (120-131)/(63-71) 131/63 (12/19 0053) SpO2:  [98 %-100 %] 98 % (12/19 0053) Physical Exam: General: Well-appearing but disheveled gentleman no current distress. Cardiovascular: RRR with occasional ectopic beats.  No  M/R/G Respiratory: CTAB, no increased work of breathing Abdomen: Flat, soft, nontender. Extremities: Right lower extremity with 2+ pitting edema, grossly larger than the left lower extremity.  Left lower extremity with 1+ pitting edema.  No redness or erythema in either limb.  Laboratory: Most recent CBC Lab Results  Component Value Date   WBC 6.0 01/10/2023   HGB 13.3 01/10/2023   HCT 40.6 01/10/2023   MCV 86.4 01/10/2023   PLT 338 01/10/2023   Most recent BMP    Latest Ref Rng & Units 01/11/2023    3:15 AM  BMP  Glucose 70 - 99 mg/dL 93   BUN 8 - 23 mg/dL 19   Creatinine 5.63 - 1.24 mg/dL 8.75   Sodium 643 - 329 mmol/L 136   Potassium 3.5 - 5.1 mmol/L 4.1   Chloride 98 - 111 mmol/L 104   CO2 22 - 32 mmol/L 24   Calcium 8.9 - 10.3 mg/dL 9.3     [name], DO 01/13/2023, 3:52 PM  PGY-1, [place] [facility] FPTS Intern pager: [phone], text pages welcome Secure chat group [facility] Teaching Service

## 2023-01-13 NOTE — Discharge Summary (Deleted)
Family Medicine Teaching [facility] Discharge Summary  Patient name: [name] Medical record number: [id number] Date of birth: December 06, 1940 Age: 82 y.o. Gender: male Date of Admission: 01/06/2023  Date of Discharge: 01/13/2023 Admitting Physician: Carney Living, MD  Primary Care Provider: Patient, No Pcp Per Consultants: Psychiatry  Indication for Hospitalization: AMS  Discharge Diagnoses/Problem List:  Principal Problem for Admission: Schizophrenia Other Problems addressed during stay:  Principal Problem:   AMS (altered mental status) Active Problems:   Schizophrenia [facility])    Brief Hospital Course:  [name] is a 82 y.o.male with a history of schizophrenia, dementia, hypertension, hep C, hearing loss and BPH who was admitted to the family medicine teaching Service at [facility] for AMS. His hospital course is detailed below:  AMS Uncertain where patient's baseline mental status is, family did not provide collateral information. He has been seen at the [facility] for a history of schizophrenia, but is not currently on medication. CT head and neck in ED were negative for acute injury or intracranial concerns. Mental status worsened with treatment. He had hypokalemia on admission, which was appropriately repleted over 3 days.   Schizophrenia Patient became increasingly agitated toward staff over the course of his stay, and exhibited repeated religious delusions, paranoia surrounding finances, and fixation on past legal issues. Ultimately it was decided he was not able to care for himself or make medical decisions appropriately, and IVC was enacted. He remained stable on daily haldol until he could be transferred to appropriate psychiatric facility.  UTI Asymptomatic with malodorous urine and UA suggestive of UTI.  Received 1 dose CTX and transition to cefadroxil x 5 days.   Asymmetric LE Edema Developed inpatient.  Had been refusing Lovenox for majority of stay so DVT  ultrasound was ordered to rule out clot.  Ultrasound showed no concern for clot.  Discharged in stable condition  Other chronic conditions were medically managed with home medications and formulary alternatives as necessary   PCP Follow-up Recommendations: BMP in 1 week Complete antibiotic course for UTI  Disposition: Inpatient Psych  Discharge Condition: Stable  Discharge Exam:  Vitals:   01/12/23 1857 01/13/23 0053  BP: 120/71 131/63  Pulse: 94 68  Resp: 17 20  Temp:  98 F (36.7 C)  SpO2: 100% 98%   General: Well appearing but disheveled gentleman, no apparent distress Cardiac: Regular rhythm with occasional ectopic beats.  No M/R/G Respiratory: CTAB, no increased work of breathing Extremities: Right sided significant pitting edema, 3+.  No redness warmth or tenderness.  Left side with trace pitting edema.  Significant Procedures: None  Significant Labs and Imaging:  No results for input(s): "WBC", "HGB", "HCT", "PLT" in the last 48 hours. No results for input(s): "NA", "K", "CL", "CO2", "GLUCOSE", "BUN", "CREATININE", "CALCIUM", "MG", "PHOS", "ALKPHOS", "AST", "ALT", "ALBUMIN", "PROTEIN" in the last 48 hours.  None  Results/Tests Pending at Time of Discharge: None  Discharge Medications:  Allergies as of 01/13/2023   No Known Allergies      Medication List     TAKE these medications    haloperidol 5 MG tablet Commonly known as: HALDOL Take 1 tablet (5 mg total) by mouth daily as needed for agitation (Severe agitation threatening harm ot self or others).   haloperidol 10 MG tablet Commonly known as: HALDOL Take 1 tablet (10 mg total) by mouth at bedtime.   haloperidol lactate 5 MG/ML injection Commonly known as: HALDOL Inject 0.4 mLs (2 mg total) into the muscle daily as  needed.   hydrochlorothiazide 12.5 MG tablet Commonly known as: HYDRODIURIL Take 1 tablet (12.5 mg total) by mouth daily.   potassium chloride 10 MEQ tablet Commonly known as:  KLOR-CON M Take 1 tablet (10 mEq total) by mouth 2 (two) times daily.        Discharge Instructions: Please refer to Patient Instructions section of EMR for full details.  Patient was counseled important signs and symptoms that should prompt return to medical care, changes in medications, dietary instructions, activity restrictions, and follow up appointments.   Follow-Up Appointments:  Follow-up Information     [facility] Follow [facility].   Specialty: Home Health Services Why: Someone will call you to schedule first home visit. Contact information: [address] [place] [address] [phone]                 [name], DO 01/13/2023, 12:02 PM PGY-1, [facility] Family Medicine
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
## 2023-01-13 NOTE — Assessment & Plan Note (Signed)
Patient was noted to have new asymmetric swelling of the bilateral lower extremity with right being more swollen than left.  Ultrasound of the right lower extremity was ordered to rule out DVT.  Ultrasound was negative.  Resolved.

## 2023-01-13 NOTE — TOC Transition Note (Signed)
Transition of Care [facility]) - Discharge Note   Patient Details  Name: [name] MRN: [id number] Date of Birth: [birth year]  Transition of [facility]) CM/SW Contact:  Carley Hammed, LCSW Phone Number: 01/13/2023, 8:35 AM   Clinical Narrative:    Pt to be [facility] Psych via [place] due to IVC. Accepting MD Dr. Brett Canales. Call to report- [phone]- press #2 and leave VM. (HIPAA secure VM, able to leave pt info). CSW left VM for APS worker Primitivo Gauze advising of plan.   Final next level of care: Psychiatric Hospital Barriers to Discharge: Barriers Resolved   Patient Goals and CMS Choice            Discharge Placement              Patient chooses bed at:  [facility]) Patient to be transferred to facility by: [facility] Name of family member notified: APS- Primitivo Gauze Patient and family notified of of transfer: 01/13/23  Discharge Plan and Services Additional resources added to the After Visit Summary for                                       Social Drivers of Health (SDOH) Interventions SDOH Screenings   Food Insecurity: No Food Insecurity (01/07/2023)  Housing: Low Risk  (01/07/2023)  Transportation Needs: Patient Unable To Answer (01/07/2023)  Utilities: Not At Risk (01/07/2023)  Tobacco Use: Medium Risk (01/06/2023)     Readmission Risk Interventions     No data to display

## 2023-01-13 NOTE — TOC Progression Note (Signed)
Transition of Care [facility]) - Progression Note    Patient Details  Name: [name] MRN: [id number] Date of Birth: 1940/07/24  Transition of Care [facility]) CM/SW Contact  Carley Hammed, LCSW Phone Number: 01/13/2023, 1:30 PM  Clinical Narrative:    CSW was advised by RN that when report was called, facility decided to rescind bed offer due to dementia. Extensive clinicals had been sent to facility for review, pt's primary altered mentation comes from Schizophrenia.  CSW notified APS worker and will continue to seek placement for pt. [facility] for any [facility].      Barriers to Discharge: Barriers Resolved  Expected Discharge Plan and Services         Expected Discharge Date: 01/13/23                                     Social Determinants of Health (SDOH) Interventions SDOH Screenings   Food Insecurity: No Food Insecurity (01/07/2023)  Housing: Low Risk  (01/07/2023)  Transportation Needs: Patient Unable To Answer (01/07/2023)  Utilities: Not At Risk (01/07/2023)  Tobacco Use: Medium Risk (01/06/2023)    Readmission Risk Interventions     No data to display

## 2023-01-13 NOTE — Progress Notes (Signed)
Right lower ext venous  has been completed. Refer to [facility] under chart review to view preliminary results.   01/13/2023  11:30 AM Ellene Bloodsaw, Gerarda Gunther

## 2023-01-13 NOTE — Assessment & Plan Note (Signed)
Calm, cooperative. -Psychiatry following, appreciate recommendations -Daily Haldol 10 mg  As needed Haldol 5 mg by mouth 2 mg IM for agitation -Awaiting transfer to appropriate psychiatric facility

## 2023-01-13 NOTE — Progress Notes (Signed)
Attempted right lower ext venous study. Patient refused. Marilynne Halsted, BS, RDMS, RVT

## 2023-01-14 ENCOUNTER — Other Ambulatory Visit (HOSPITAL_COMMUNITY): Payer: Self-pay

## 2023-01-14 ENCOUNTER — Encounter (HOSPITAL_COMMUNITY): Payer: Self-pay | Admitting: Family Medicine

## 2023-01-14 DIAGNOSIS — F2089 Other schizophrenia: Secondary | ICD-10-CM | POA: Diagnosis not present

## 2023-01-14 LAB — HEPATIC FUNCTION PANEL
ALT: 15 U/L (ref 0–44)
AST: 20 U/L (ref 15–41)
Albumin: 2.8 g/dL — ABNORMAL LOW (ref 3.5–5.0)
Alkaline Phosphatase: 75 U/L (ref 38–126)
Bilirubin, Direct: 0.1 mg/dL (ref 0.0–0.2)
Indirect Bilirubin: 0.6 mg/dL (ref 0.3–0.9)
Total Bilirubin: 0.7 mg/dL (ref <1.2)
Total Protein: 7.3 g/dL (ref 6.5–8.1)

## 2023-01-14 LAB — SARS CORONAVIRUS 2 BY RT PCR: SARS Coronavirus 2 by RT PCR: NEGATIVE

## 2023-01-14 MED ORDER — HALOPERIDOL LACTATE 5 MG/ML IJ SOLN
2.0000 mg | Freq: Two times a day (BID) | INTRAMUSCULAR | Status: DC | PRN
  Filled 2023-01-14: qty 1

## 2023-01-14 MED ORDER — HALOPERIDOL 5 MG PO TABS: 5.0000 mg | ORAL_TABLET | Freq: Every day | ORAL | Status: DC | PRN

## 2023-01-14 NOTE — TOC Progression Note (Signed)
Transition of Care [facility]) - Progression Note    Patient Details  Name: [name] MRN: [id number] Date of Birth: 25-Oct-1940  Transition of Care [facility]) CM/SW Contact  Carley Hammed, LCSW Phone Number: 01/14/2023, 10:34 AM  Clinical Narrative:     CSW spoke with [facility] who noted they had some discharges today and are currently not diverting and will review pt for admission. [facility] requesting negative covid test prior to review, MD notified and will place. CSW to follow and send over updated clinicals. TOC will continue to follow.     Barriers to Discharge: Barriers Resolved  Expected Discharge Plan and Services         Expected Discharge Date: 01/13/23                                     Social Determinants of Health (SDOH) Interventions SDOH Screenings   Food Insecurity: No Food Insecurity (01/07/2023)  Housing: High Risk (01/14/2023)  Transportation Needs: Patient Unable To Answer (01/07/2023)  Utilities: Not At Risk (01/07/2023)  Tobacco Use: Medium Risk (01/06/2023)    Readmission Risk Interventions     No data to display

## 2023-01-14 NOTE — Plan of Care (Signed)
  Problem: Education: Goal: Knowledge of General Education information will improve Description: Including pain rating scale, medication(s)/side effects and non-pharmacologic comfort measures Outcome: Progressing   Problem: Health Behavior/Discharge Planning: Goal: Ability to manage health-related needs will improve Outcome: Progressing   Problem: Clinical Measurements: Goal: Ability to maintain clinical measurements within normal limits will improve Outcome: Progressing Goal: Will remain free from infection Outcome: Progressing   Problem: Activity: Goal: Risk for activity intolerance will decrease Outcome: Progressing   Problem: Nutrition: Goal: Adequate nutrition will be maintained Outcome: Progressing   Problem: Coping: Goal: Level of anxiety will decrease Outcome: Progressing   Problem: Elimination: Goal: Will not experience complications related to bowel motility Outcome: Progressing Goal: Will not experience complications related to urinary retention Outcome: Progressing   Problem: Pain Management: Goal: General experience of comfort will improve Outcome: Progressing   Problem: Safety: Goal: Ability to remain free from injury will improve Outcome: Progressing   Problem: Skin Integrity: Goal: Risk for impaired skin integrity will decrease Outcome: Progressing

## 2023-01-14 NOTE — Assessment & Plan Note (Signed)
 Patient was noted to have new asymmetric swelling of the bilateral lower extremity with right being more swollen than left.  Ultrasound of the right lower extremity was ordered to rule out DVT.  Ultrasound was negative.  Resolved.

## 2023-01-14 NOTE — Progress Notes (Signed)
     Daily Progress Note Intern Pager: [phone]  Patient name: [name] Medical record number: [id number] Date of birth: 1940-06-13 Age: 82 y.o. Gender: male  Primary Care Provider: Patient, No Pcp Per Consultants: Psychiatry Code Status: full  Pt Overview and Major Events to Date:  12/13: Admitted 12/14: Psychiatry consulted 12/15: IVC initiated 12/19: Transfer to [facility] rescinded  Assessment and Plan:  [name] is an 82 year old male presenting with reported altered mental status with history of schizophrenia which has been untreated for an indeterminate amount of time.  He is medically stable and awaiting bed offer for transfer to appropriate psychiatric facility. Assessment & Plan Schizophrenia (HCC) Tolerating Haldol treatment well.  Continues to have delusions. -Psychiatry following, appreciate recommendations -Daily Haldol 10 mg  As needed Haldol 5 mg by mouth 2 mg IM for agitation -Awaiting transfer to appropriate psychiatric facility Swelling of lower extremity (Resolved: 01/14/2023) Patient was noted to have new asymmetric swelling of the bilateral lower extremity with right being more swollen than left.  Ultrasound of the right lower extremity was ordered to rule out DVT.  Ultrasound was negative.  Resolved.  Chronic and Stable Problems:  Hypertension: HCTZ 12.5 mg  FEN/GI:  heart healthy PPx: Lovenox Dispo: Psychiatric inpatient care pending bed availability  .   Subjective:  Patient awake sitting up in the chair on exam this morning.  He again reports concerns over getting [facility] resources.  I reassured him that we are doing everything we can to get him connected with those resources.  He denies any chest pain or other problems ongoing at this time.  Objective: Temp:  [97.8 F (36.6 C)-98.4 F (36.9 C)] 97.9 F (36.6 C) (12/20 0622) Pulse Rate:  [90-101] 90 (12/20 0622) Resp:  [18] 18 (12/20 0622) BP: (142-156)/(88-96) 147/90 (12/20 0622) SpO2:   [99 %-100 %] 100 % (12/20 0622) Physical Exam: General: Well-appearing but disheveled elderly man no distress Cardiovascular: RRR no M/R/G Respiratory: CTAB, no increased work of breathing Abdomen: Flat, soft, nontender. Extremities: 2+ pitting edema of the right lower extremity, 1+ pitting edema of the left lower extremity.  Unchanged from yesterday.  Laboratory: Most recent CBC Lab Results  Component Value Date   WBC 6.0 01/10/2023   HGB 13.3 01/10/2023   HCT 40.6 01/10/2023   MCV 86.4 01/10/2023   PLT 338 01/10/2023   Most recent BMP    Latest Ref Rng & Units 01/11/2023    3:15 AM  BMP  Glucose 70 - 99 mg/dL 93   BUN 8 - 23 mg/dL 19   Creatinine 8.46 - 1.24 mg/dL 9.62   Sodium 952 - 841 mmol/L 136   Potassium 3.5 - 5.1 mmol/L 4.1   Chloride 98 - 111 mmol/L 104   CO2 22 - 32 mmol/L 24   Calcium 8.9 - 10.3 mg/dL 9.3     [name], DO 01/14/2023, 7:46 AM  PGY-1, [facility] FPTS Intern pager: [phone], text pages welcome Secure chat group [facility] Teaching Service

## 2023-01-14 NOTE — Plan of Care (Signed)
  Problem: Education: Goal: Knowledge of General Education information will improve Description Including pain rating scale, medication(s)/side effects and non-pharmacologic comfort measures Outcome: Progressing   

## 2023-01-14 NOTE — Assessment & Plan Note (Addendum)
Tolerating Haldol treatment well.  Continues to have delusions. -Psychiatry following, appreciate recommendations -Daily Haldol 10 mg  As needed Haldol 5 mg by mouth 2 mg IM for agitation -Awaiting transfer to appropriate psychiatric facility

## 2023-01-14 NOTE — Assessment & Plan Note (Deleted)
 Patient continues to be at baseline observed here in the hospital.  Still unable to assess true baseline due to lack of collateral. -TOC consulted for placement and to follow with APS

## 2023-01-14 NOTE — Progress Notes (Signed)
Mobility Specialist: Progress Note   01/14/23 1042  Mobility  Activity Ambulated with assistance in hallway  Level of Assistance Standby assist, set-up cues, supervision of patient - no hands on  Assistive Device Front wheel walker  Distance Ambulated (ft) 150 ft  Activity Response Tolerated well  Mobility Referral Yes  Mobility visit 1 Mobility  Mobility Specialist Start Time (ACUTE ONLY) 0934  Mobility Specialist Stop Time (ACUTE ONLY) 0939  Mobility Specialist Time Calculation (min) (ACUTE ONLY) 5 min    Received pt in chair having no complaints and agreeable to mobility. Pt was asymptomatic throughout ambulation and returned to room w/o fault. Left in chair w/ call bell in reach and all needs met.  Maurene Capes Mobility Specialist Please contact via SecureChat or Rehab office at [phone]
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
## 2023-01-15 ENCOUNTER — Other Ambulatory Visit (HOSPITAL_COMMUNITY): Payer: Self-pay

## 2023-01-15 DIAGNOSIS — F2089 Other schizophrenia: Secondary | ICD-10-CM | POA: Diagnosis not present

## 2023-01-15 MED ORDER — ORAL CARE MOUTH RINSE: 15.0000 mL | OROMUCOSAL | Status: DC | PRN

## 2023-01-15 NOTE — TOC Progression Note (Signed)
Transition of Care [facility]) - Progression Note    Patient Details  Name: DAEON KARBER MRN: [id number] Date of Birth: [birth year]  [name] CM/SW Contact  Carley Hammed, LCSW Phone Number: 01/15/2023, 11:06 AM  Clinical Narrative:    [facility] and updated clinicals, will continue to follow for placement updates. IVC to be renewed tomorrow. TOC will continue to follow.      Barriers to Discharge: Barriers Resolved  Expected Discharge Plan and Services         Expected Discharge Date: 01/13/23                                     Social Determinants of Health (SDOH) Interventions SDOH Screenings   Food Insecurity: No Food Insecurity (01/07/2023)  Housing: High Risk (01/14/2023)  Transportation Needs: Patient Unable To Answer (01/07/2023)  Utilities: Not At Risk (01/07/2023)  Tobacco Use: Medium Risk (01/06/2023)    Readmission Risk Interventions     No data to display
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
## 2023-01-15 NOTE — Assessment & Plan Note (Signed)
Tolerating Haldol treatment well.  Continues to have delusions. -Psychiatry following, appreciate recommendations -Daily Haldol 10 mg  As needed Haldol 5 mg by mouth 2 mg IM for agitation -Awaiting transfer to appropriate psychiatric facility

## 2023-01-15 NOTE — Progress Notes (Addendum)
     Daily Progress Note Intern Pager: [phone]  Patient name: [name] Medical record number: [id number] Date of birth: 08/22/1940 Age: 82 y.o. Gender: male  Primary Care Provider: Patient, No Pcp Per Consultants: Psychiatry Code Status: Full  Pt Overview and Major Events to Date:  12/13: Admitted 12/14: Psychiatry consulted 12/15: IVC initiated 12/19: Transfer to [facility] rescinded  Assessment and Plan:  [name] is a 82 y.o. male presenting with AMS in the setting of untreated schizophrenia.  Medically stable, awaiting bed offer for transfer to psychiatric facility.  Pertinent PMH/PSH includes schizophrenia, hypertension.  Assessment & Plan Schizophrenia (HCC) Tolerating Haldol treatment well.  Continues to have delusions. -Psychiatry following, appreciate recommendations -Daily Haldol 10 mg  As needed Haldol 5 mg by mouth 2 mg IM for agitation -Awaiting transfer to appropriate psychiatric facility   Chronic and Stable Issues: HTN: HCTZ 12.5 mg  FEN/GI: Heart healthy PPx: Lovenox Dispo: Inpatient psychiatric facility pending placement  Subjective:  No concerns this morning, just asking about discharge date  Objective: Temp:  [97.9 F (36.6 C)-98 F (36.7 C)] 98 F (36.7 C) (12/20 1100) Pulse Rate:  [88-90] 88 (12/20 1100) Resp:  [17-18] 17 (12/20 1100) BP: (141-147)/(81-90) 141/81 (12/20 1100) SpO2:  [100 %] 100 % (12/20 1100) Physical Exam: General: NAD, awake and alert Cardiovascular: RRR no MRG Respiratory: CTAB normal WOB on RA Abdomen: soft NTND  Laboratory: Most recent CBC Lab Results  Component Value Date   WBC 6.0 01/10/2023   HGB 13.3 01/10/2023   HCT 40.6 01/10/2023   MCV 86.4 01/10/2023   PLT 338 01/10/2023   Most recent BMP    Latest Ref Rng & Units 01/11/2023    3:15 AM  BMP  Glucose 70 - 99 mg/dL 93   BUN 8 - 23 mg/dL 19   Creatinine 1.47 - 1.24 mg/dL 8.29   Sodium 562 - 130 mmol/L 136   Potassium 3.5 - 5.1  mmol/L 4.1   Chloride 98 - 111 mmol/L 104   CO2 22 - 32 mmol/L 24   Calcium 8.9 - 10.3 mg/dL 9.3      Vonna Drafts, MD 01/15/2023, 12:15 AM  PGY-2, [facility] FPTS Intern pager: [phone], text pages welcome Secure chat group [facility] Teaching Service
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
## 2023-01-15 NOTE — Plan of Care (Signed)
  Problem: Education: Goal: Knowledge of General Education information will improve Description: Including pain rating scale, medication(s)/side effects and non-pharmacologic comfort measures Outcome: Progressing   Problem: Health Behavior/Discharge Planning: Goal: Ability to manage health-related needs will improve Outcome: Progressing   Problem: Clinical Measurements: Goal: Ability to maintain clinical measurements within normal limits will improve Outcome: Progressing Goal: Will remain free from infection Outcome: Progressing   Problem: Activity: Goal: Risk for activity intolerance will decrease Outcome: Progressing   Problem: Nutrition: Goal: Adequate nutrition will be maintained Outcome: Progressing   Problem: Coping: Goal: Level of anxiety will decrease Outcome: Progressing   Problem: Elimination: Goal: Will not experience complications related to bowel motility Outcome: Progressing Goal: Will not experience complications related to urinary retention Outcome: Progressing   Problem: Pain Management: Goal: General experience of comfort will improve Outcome: Progressing   Problem: Safety: Goal: Ability to remain free from injury will improve Outcome: Progressing   Problem: Skin Integrity: Goal: Risk for impaired skin integrity will decrease Outcome: Progressing

## 2023-01-16 DIAGNOSIS — F2089 Other schizophrenia: Secondary | ICD-10-CM | POA: Diagnosis not present

## 2023-01-16 DIAGNOSIS — R011 Cardiac murmur, unspecified: Secondary | ICD-10-CM | POA: Insufficient documentation
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
## 2023-01-16 NOTE — Assessment & Plan Note (Addendum)
Tolerating Haldol treatment well.  Continues to have delusions. -Psychiatry following, appreciate recommendations -Will need IVC renewed today by psychiatry if appropriate  -Daily Haldol 5 mg in morning and 10 mg at bedtime   As needed Haldol 5 mg by mouth or 2 mg IM for agitation -Awaiting transfer to appropriate psychiatric facility

## 2023-01-16 NOTE — Plan of Care (Signed)
  Problem: Education: Goal: Knowledge of General Education information will improve Description: Including pain rating scale, medication(s)/side effects and non-pharmacologic comfort measures Outcome: Progressing   Problem: Health Behavior/Discharge Planning: Goal: Ability to manage health-related needs will improve Outcome: Progressing   Problem: Clinical Measurements: Goal: Ability to maintain clinical measurements within normal limits will improve Outcome: Progressing Goal: Will remain free from infection Outcome: Progressing   Problem: Activity: Goal: Risk for activity intolerance will decrease Outcome: Progressing   Problem: Nutrition: Goal: Adequate nutrition will be maintained Outcome: Progressing   Problem: Coping: Goal: Level of anxiety will decrease Outcome: Progressing   Problem: Elimination: Goal: Will not experience complications related to bowel motility Outcome: Progressing Goal: Will not experience complications related to urinary retention Outcome: Progressing   Problem: Pain Management: Goal: General experience of comfort will improve Outcome: Progressing   Problem: Safety: Goal: Ability to remain free from injury will improve Outcome: Progressing   Problem: Skin Integrity: Goal: Risk for impaired skin integrity will decrease Outcome: Progressing

## 2023-01-16 NOTE — Plan of Care (Signed)
  Problem: Education: Goal: Knowledge of General Education information will improve Description Including pain rating scale, medication(s)/side effects and non-pharmacologic comfort measures Outcome: Progressing   

## 2023-01-16 NOTE — TOC Progression Note (Addendum)
Transition of Care [facility]) - Progression Note    Patient Details  Name: ABEN MANSOOR MRN: [id number] Date of Birth: February 11, 1940  Transition of Care [facility]) CM/SW Contact  Deatra Robinson, [place] Phone Number: 01/16/2023, 9:25 AM  Clinical Narrative:  IVC submitted, envelope # [id number]. Await response from magistrate.   UPDATE 4098: IVC accepted by magistrate, Case # [id number]. Copies on chart. [employer] called for service.   Dellie Burns, MSW, LCSW [phone] (coverage)         Barriers to Discharge: Barriers Resolved  Expected Discharge Plan and Services         Expected Discharge Date: 01/13/23                                     Social Determinants of Health (SDOH) Interventions SDOH Screenings   Food Insecurity: No Food Insecurity (01/07/2023)  Housing: High Risk (01/14/2023)  Transportation Needs: Patient Unable To Answer (01/07/2023)  Utilities: Not At Risk (01/07/2023)  Tobacco Use: Medium Risk (01/06/2023)    Readmission Risk Interventions     No data to display

## 2023-01-16 NOTE — Progress Notes (Signed)
     Daily Progress Note Intern Pager: [phone]  Patient name: [name] Medical record number: [id number] Date of birth: [birth year] Age: 82 y.o. Gender: male  Primary Care Provider: Patient, No Pcp Per Consultants: Psychiatry Code Status: Full  Pt Overview and Major Events to Date:  12/13: Admitted 12/14: Psychiatry consulted 12/15: IVC initiated 12/19: Transfer to [facility] rescinded  Assessment and Plan: [name] is a 82 y.o. male presenting with AMS in the setting of untreated schizophrenia.  Medically stable, awaiting bed offer for transfer to psychiatric facility.  Pertinent PMH/PSH includes schizophrenia, hypertension.  Assessment & Plan Schizophrenia (HCC) Tolerating Haldol treatment well.  Continues to have delusions. -Psychiatry following, appreciate recommendations -Will need IVC renewed today by psychiatry if appropriate  -Daily Haldol 5 mg in morning and 10 mg at bedtime   As needed Haldol 5 mg by mouth or 2 mg IM for agitation -Awaiting transfer to appropriate psychiatric facility Murmur, cardiac Asymptomatic. -Recommend outpatient echocardiogram   Chronic and Stable Issues: HTN: HCTZ 12.5 mg  FEN/GI: Heart healthy PPx: Lovenox Dispo: Pending placement to inpatient psych  Subjective:  Reading his bible. When asked how he is feeling he says hungry and ready for breakfast   Objective: Temp:  [98.3 F (36.8 C)-98.6 F (37 C)] 98.6 F (37 C) (12/21 1700) Pulse Rate:  [80-95] 80 (12/21 1700) Resp:  [18] 18 (12/21 1700) BP: (127-131)/(65-69) 131/65 (12/21 1700) SpO2:  [98 %-100 %] 100 % (12/21 1700) Physical Exam: General: sitting in chair reading bible  Cardiovascular: RRR, very soft systolic murmur  Respiratory: breathing comfortably  Psych: calm, no pressured speech, answers questions appropriately  Laboratory: Most recent CBC Lab Results  Component Value Date   WBC 6.0 01/10/2023   HGB 13.3 01/10/2023   HCT 40.6 01/10/2023   MCV  86.4 01/10/2023   PLT 338 01/10/2023   Most recent BMP    Latest Ref Rng & Units 01/11/2023    3:15 AM  BMP  Glucose 70 - 99 mg/dL 93   BUN 8 - 23 mg/dL 19   Creatinine 3.47 - 1.24 mg/dL 4.25   Sodium 956 - 387 mmol/L 136   Potassium 3.5 - 5.1 mmol/L 4.1   Chloride 98 - 111 mmol/L 104   CO2 22 - 32 mmol/L 24   Calcium 8.9 - 10.3 mg/dL 9.3      Erick Alley, DO 01/16/2023, 2:39 AM  PGY-3, [facility] FPTS Intern pager: [phone], text pages welcome Secure chat group [facility] Teaching Service

## 2023-01-16 NOTE — Assessment & Plan Note (Signed)
Asymptomatic. -Recommend outpatient echocardiogram

## 2023-01-17 ENCOUNTER — Other Ambulatory Visit (HOSPITAL_COMMUNITY): Payer: Self-pay

## 2023-01-17 DIAGNOSIS — F209 Schizophrenia, unspecified: Secondary | ICD-10-CM | POA: Diagnosis not present

## 2023-01-17 DIAGNOSIS — F2089 Other schizophrenia: Secondary | ICD-10-CM | POA: Diagnosis not present

## 2023-01-17 DIAGNOSIS — R3915 Urgency of urination: Secondary | ICD-10-CM | POA: Insufficient documentation

## 2023-01-17 LAB — SARS CORONAVIRUS 2 (TAT 6-24 HRS): SARS Coronavirus 2: NEGATIVE

## 2023-01-17 MED ORDER — HALOPERIDOL 5 MG PO TABS
10.0000 mg | ORAL_TABLET | Freq: Two times a day (BID) | ORAL | Status: DC
Start: 2023-01-17 — End: 2023-01-19
  Administered 2023-01-18 – 2023-01-19 (×3): 10 mg via ORAL
  Filled 2023-01-17 (×4): qty 2

## 2023-01-17 MED ORDER — TAMSULOSIN HCL 0.4 MG PO CAPS
0.4000 mg | ORAL_CAPSULE | Freq: Every day | ORAL | Status: DC
  Administered 2023-01-17 – 2023-01-19 (×3): 0.4 mg via ORAL
  Filled 2023-01-17 (×3): qty 1

## 2023-01-17 NOTE — Assessment & Plan Note (Addendum)
Chronic for years according to patient. Treated for UTI.  - Start Flomax 0.4mg  daily

## 2023-01-17 NOTE — Progress Notes (Signed)
Mobility Specialist: Progress Note   01/17/23 1227  Mobility  Activity Ambulated independently in hallway  Level of Assistance Independent  Assistive Device Front wheel walker  Distance Ambulated (ft) 300 ft  Activity Response Tolerated well  Mobility Referral Yes  Mobility visit 1 Mobility  Mobility Specialist Start Time (ACUTE ONLY) 1144  Mobility Specialist Stop Time (ACUTE ONLY) 1152  Mobility Specialist Time Calculation (min) (ACUTE ONLY) 8 min    Pt was agreeable to mobility session - received on bathroom floor exercising. No complaints, initially very frustrated but calmed down a little throughout session. Ambulated under SV throughout. Was picking RW up to turn and wouldn't use it to reach for things or tie shoes. Returned to room without fault. Left in BR with all needs met, call bell in reach.   Maurene Capes Mobility Specialist Please contact via SecureChat or Rehab office at [phone]

## 2023-01-17 NOTE — TOC Progression Note (Signed)
Transition of Care [facility]) - Progression Note    Patient Details  Name: [name] MRN: [id number] Date of Birth: [birth year]  Transition of [facility]) CM/SW Contact  Michaela Corner, [place] Phone Number: 01/17/2023, 1:58 PM  Clinical Narrative:   CSW contacted Fredrik Cove w/ [facility] [phone] ext [phone]) about placement. Per [place], [facility] [name] has no psych bed availability at this time.   2:00PM CSW left VM for [facility] [name] [facility] [phone]) in regard to placement.   2:04PM: CSW spoke with representative at [facility] [phone]) for bed placement. [place] receives referrals through [employer] [phone]). CSW left VM for central bed management about placement and bed availability.     Barriers to Discharge: Barriers Resolved  Expected Discharge Plan and Services         Expected Discharge Date: 01/13/23                                     Social Determinants of Health (SDOH) Interventions SDOH Screenings   Food Insecurity: No Food Insecurity (01/07/2023)  Housing: High Risk (01/14/2023)  Transportation Needs: Patient Unable To Answer (01/07/2023)  Utilities: Not At Risk (01/07/2023)  Tobacco Use: Medium Risk (01/06/2023)    Readmission Risk Interventions     No data to display

## 2023-01-17 NOTE — Assessment & Plan Note (Addendum)
Tolerating Haldol treatment well.  Continues to have delusions. IVC renewed on 12/22. -Psychiatry following, appreciate recommendations -Will need IVC renewed today by psychiatry if appropriate  -Increased Haldol to 10mg  BID   As needed Haldol 5 mg by mouth or 2 mg IM for agitation -Awaiting transfer to appropriate psychiatric facility

## 2023-01-17 NOTE — Assessment & Plan Note (Addendum)
Asymptomatic. -Recommend outpatient echocardiogram
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
## 2023-01-17 NOTE — Progress Notes (Signed)
Patient sat on the floor and started doing sit ups and some other exercises.  He says he need to do the exercises so that he will feel good.

## 2023-01-17 NOTE — Progress Notes (Signed)
     Daily Progress Note Intern Pager: [phone]  Patient name: [name] Medical record number: [id number] Date of birth: 01-06-1941 Age: 82 y.o. Gender: male  Primary Care Provider: Patient, No Pcp Per Consultants: Psychiatry Code Status: Full  Pt Overview and Major Events to Date:  12/13: Admitted 12/14: Psychiatry consulted 12/15: IVC initiated 12/19: Transfer to [facility] rescinded 12/22: IVC renewed  Assessment and Plan: [name] is a 82 y.o. male who presented with AMS in the setting of untreated schizophrenia who is now medically stable and awaiting transfer to psychiatric facility at the [name]. Pertinent PMH/PSH includes HTN, schizophrenia.  Assessment & Plan Schizophrenia (HCC) Tolerating Haldol treatment well.  Continues to have delusions. IVC renewed on 12/22. -Psychiatry following, appreciate recommendations -Will need IVC renewed today by psychiatry if appropriate  -Increased Haldol to 10mg  BID   As needed Haldol 5 mg by mouth or 2 mg IM for agitation -Awaiting transfer to appropriate psychiatric facility Murmur, cardiac Asymptomatic. -Recommend outpatient echocardiogram Urinary urgency Chronic for years according to patient. Treated for UTI.  - Start Flomax 0.4mg  daily  Chronic and Stable Issues: HTN: HCTZ 12.5 mg   FEN/GI: Heart healthy PPx: Lovenox Dispo: Pending placement to inpatient psych at [facility]    Subjective:  Patient states he wants to try a 10-day fast from food and medications.  Is difficult to delineate why he wants to try this.  He also notes he has had urinary urgency for decades and that he was treated for a UTI early in hospitalization.  Additionally, food getting stuck in throat although difficulty eating.  Objective: Temp:  [98.2 F (36.8 C)] 98.2 F (36.8 C) (12/23 0938) Pulse Rate:  [76] 76 (12/23 0938) Resp:  [19] 19 (12/23 0938) BP: (133)/(89) 133/89 (12/23 0938) SpO2:  [100 %] 100 % (12/23 6644) Physical  Exam: General: Sitting in chair, comfortable Cardiovascular: RRR, systolic murmur Respiratory: Normal WOB Psych: Calm, no obvious EPS symptoms, delusional thought content and disorganized thought process  Laboratory: Most recent CBC Lab Results  Component Value Date   WBC 6.0 01/10/2023   HGB 13.3 01/10/2023   HCT 40.6 01/10/2023   MCV 86.4 01/10/2023   PLT 338 01/10/2023   Most recent BMP    Latest Ref Rng & Units 01/11/2023    3:15 AM  BMP  Glucose 70 - 99 mg/dL 93   BUN 8 - 23 mg/dL 19   Creatinine 0.34 - 1.24 mg/dL 7.42   Sodium 595 - 638 mmol/L 136   Potassium 3.5 - 5.1 mmol/L 4.1   Chloride 98 - 111 mmol/L 104   CO2 22 - 32 mmol/L 24   Calcium 8.9 - 10.3 mg/dL 9.3    Imaging/Diagnostic Tests: No recent imaging results  Adrian Mattocks, DO 01/17/2023, 10:39 AM  PGY-3, [facility] FPTS Intern pager: [phone], text pages welcome Secure chat group [facility] Teaching Service

## 2023-01-18 ENCOUNTER — Other Ambulatory Visit (HOSPITAL_COMMUNITY): Payer: Self-pay

## 2023-01-18 DIAGNOSIS — F209 Schizophrenia, unspecified: Secondary | ICD-10-CM | POA: Diagnosis not present

## 2023-01-18 DIAGNOSIS — F2089 Other schizophrenia: Secondary | ICD-10-CM | POA: Diagnosis not present

## 2023-01-18 LAB — BASIC METABOLIC PANEL
Anion gap: 8 (ref 5–15)
BUN: 16 mg/dL (ref 8–23)
CO2: 26 mmol/L (ref 22–32)
Calcium: 9.3 mg/dL (ref 8.9–10.3)
Chloride: 103 mmol/L (ref 98–111)
Creatinine, Ser: 1.16 mg/dL (ref 0.61–1.24)
GFR, Estimated: >60 mL/min (ref >60)
Glucose, Bld: 121 mg/dL — ABNORMAL HIGH (ref 70–99)
Potassium: 3.9 mmol/L (ref 3.5–5.1)
Sodium: 137 mmol/L (ref 135–145)

## 2023-01-18 LAB — MAGNESIUM: Magnesium: 1.8 mg/dL (ref 1.7–2.4)

## 2023-01-18 MED ORDER — TAMSULOSIN HCL 0.4 MG PO CAPS
0.4000 mg | ORAL_CAPSULE | Freq: Every day | ORAL | 0 refills | Status: DC
  Filled 2023-01-18: qty 30, 30d supply, fill #0

## 2023-01-18 MED ORDER — HALOPERIDOL 10 MG PO TABS
10.0000 mg | ORAL_TABLET | Freq: Every day | ORAL | 0 refills | Status: DC
  Filled 2023-01-18: qty 26, 26d supply, fill #0

## 2023-01-18 MED ORDER — HALOPERIDOL DECANOATE 100 MG/ML IM SOLN
100.0000 mg | Freq: Once | INTRAMUSCULAR | Status: AC
  Administered 2023-01-18: 100 mg via INTRAMUSCULAR
  Filled 2023-01-18: qty 1

## 2023-01-18 NOTE — TOC CM/SW Note (Addendum)
Transition of Care [facility]) - Inpatient Brief Assessment   Patient Details  Name: STACI EICHENAUER MRN: [id number] Date of Birth: 20-Jan-1941  Transition of Care [facility]) CM/SW Contact:    Marliss Coots, LCSW Phone Number: 01/18/2023, 3:26 PM   Clinical Narrative:  3:26 PM CSW submitted IVC Rescind through [facility] E-File (case: [id number], envelope: [id number]). CSW will continue to be available as needed.  3:51 PM CSW informed medical team that patient could discharge to [facility] but that there would not be a formal bed at their shelter until next Wednesday. Per RNCM, taxi voucher was given to bedside RN for discharge tomorrow.  Transition of Care Asessment: Insurance and Status: Insurance coverage has been reviewed Patient has primary care physician: No (States his PCP at the [name] retired, want to go to the [name] to get a new PCP.) Home environment has been reviewed: Yes Prior level of function:: Independent- Lives with sister Prior/Current Home Services: No current home services Social Drivers of Health Review: SDOH reviewed no interventions necessary Readmission risk has been reviewed: Yes Transition of care needs: transition of care needs identified, TOC will continue to follow

## 2023-01-18 NOTE — Assessment & Plan Note (Signed)
Continues to have psychosis but improved with haldol and no evidence of EPS. Most likely had exacerbation in the setting of poor adherence secondary to disease itself and socioeconomic barriers.  -Psychiatry following, appreciate recommendations - IVC rescinded given patient has improved and is no longer acutely psychotic .  - LAI planned for tomorrow and oral haldol bridge of 10 mg daily for one month until next LAI dose at [facility] follow up.  - As needed Haldol 5 mg by mouth or 2 mg IM for agitation

## 2023-01-18 NOTE — Progress Notes (Signed)
     Daily Progress Note Intern Pager: [phone]  Patient name: [name] Medical record number: [id number] Date of birth: 02-21-1940 Age: 82 y.o. Gender: male  Primary Care Provider: Patient, No Pcp Per Consultants: Psychiatry  Code Status: Full   Pt Overview and Major Events to Date:  12/13: Admitted 12/14: Psychiatry consulted 12/15: IVC initiated 12/19: Transfer to [facility] rescinded 12/22: IVC renewed 12/24: IVC rescinded   Assessment and Plan: [name] is a 82 y.o. male who presented with AMS in the setting of untreated schizophrenia. Pertinent PMH/PSH includes HTN, schizophrenia.  Assessment & Plan Schizophrenia, chronic condition with acute exacerbation (HCC) Continues to have psychosis but improved with haldol and no evidence of EPS. Most likely had exacerbation in the setting of poor adherence secondary to disease itself and socioeconomic barriers.  -Psychiatry following, appreciate recommendations - IVC rescinded given patient has improved and is no longer acutely psychotic .  - LAI planned for tomorrow and oral haldol bridge of 10 mg daily for one month until next LAI dose at [facility] follow up.  - As needed Haldol 5 mg by mouth or 2 mg IM for agitation  Murmur, cardiac Asymptomatic. -Recommend outpatient echocardiogram Urinary urgency Chronic for years according to patient. S/p treatment for UTI.  - Continue Flomax 0.4mg  daily   Chronic and Stable Issues: HTN: HCTZ 12.5 mg   FEN/GI: Heart healthy  PPx: lovenox  Dispo:Home or to a shelter tomorrow pending LAI and confirmation from family that patient can be taken home.   Subjective:  Patient says he feels better today. He continues to have urgency. He says that he would like to shower today   Objective: Temp:  [98 F (36.7 C)-98.1 F (36.7 C)] 98.1 F (36.7 C) (12/24 0800) Pulse Rate:  [81-88] 88 (12/24 0800) Resp:  [17-18] 18 (12/24 0800) BP: (138-140)/(74-76) 140/76 (12/24 0800) SpO2:  [98  %-100 %] 98 % (12/24 0800) Physical Exam: General: general hygiene poor, appears "on edge", hard of hearing  Cardiovascular: well perfused  Respiratory: normal work of breathing  MSK: Is able to ambulate with walker  Psyc: mildly agitated, frustrated, slightly paranoid, no reaction to internal stimuli   Laboratory: Most recent CBC Lab Results  Component Value Date   WBC 6.0 01/10/2023   HGB 13.3 01/10/2023   HCT 40.6 01/10/2023   MCV 86.4 01/10/2023   PLT 338 01/10/2023   Most recent BMP    Latest Ref Rng & Units 01/18/2023    8:22 AM  BMP  Glucose 70 - 99 mg/dL 253   BUN 8 - 23 mg/dL 16   Creatinine 6.64 - 1.24 mg/dL 4.03   Sodium 474 - 259 mmol/L 137   Potassium 3.5 - 5.1 mmol/L 3.9   Chloride 98 - 111 mmol/L 103   CO2 22 - 32 mmol/L 26   Calcium 8.9 - 10.3 mg/dL 9.3    MG wnl  Lockie Mola, MD 01/18/2023, 5:06 PM  PGY-2, [facility] FPTS Intern pager: [phone], text pages welcome Secure chat group [facility] Teaching Service

## 2023-01-18 NOTE — Plan of Care (Signed)
  Problem: Education: Goal: Knowledge of General Education information will improve Description: Including pain rating scale, medication(s)/side effects and non-pharmacologic comfort measures Outcome: Not Progressing   

## 2023-01-18 NOTE — Assessment & Plan Note (Signed)
Asymptomatic. -Recommend outpatient echocardiogram

## 2023-01-18 NOTE — Plan of Care (Signed)
  Problem: Activity: Goal: Risk for activity intolerance will decrease Outcome: Progressing   Problem: Nutrition: Goal: Adequate nutrition will be maintained Outcome: Progressing   Problem: Elimination: Goal: Will not experience complications related to bowel motility Outcome: Progressing Goal: Will not experience complications related to urinary retention Outcome: Progressing   

## 2023-01-18 NOTE — Assessment & Plan Note (Signed)
Chronic for years according to patient. S/p treatment for UTI.  - Continue Flomax 0.4mg  daily
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
## 2023-01-19 DIAGNOSIS — F209 Schizophrenia, unspecified: Secondary | ICD-10-CM | POA: Diagnosis not present

## 2023-01-19 MED ORDER — HYDROCHLOROTHIAZIDE 12.5 MG PO TABS: 12.5000 mg | ORAL_TABLET | Freq: Every day | ORAL | 1 refills | Status: DC

## 2023-01-19 NOTE — TOC Transition Note (Addendum)
Transition of Care [facility]) - Discharge Note   Patient Details  Name: JONAN KORTAN MRN: [id number] Date of Birth: 1940/06/02  [facility] CM/SW Contact:  Marliss Coots, LCSW Phone Number: 01/19/2023, 9:35 AM   Clinical Narrative:     9:35 AM CSW provided taxi voucher with correct destination to RN at nurses station. CSW confirmed with bedside RN that patient has a follow up appointment at family medicine on December 27th. Patient is to discharge to [facility] today via taxi.  Final next level of care: Homeless Shelter Barriers to Discharge: Barriers Resolved   Discharge Placement              Patient chooses bed at:  [facility]) Patient to be transferred to facility by: Taxi Name of family member notified: [name]; Sister; [phone] Patient and family notified of of transfer: 01/19/23  Discharge Plan and Services Additional resources added to the After Visit Summary for                                       Social Drivers of Health (SDOH) Interventions SDOH Screenings   Food Insecurity: No Food Insecurity (01/07/2023)  Housing: High Risk (01/14/2023)  Transportation Needs: Patient Unable To Answer (01/07/2023)  Utilities: Not At Risk (01/07/2023)  Tobacco Use: Medium Risk (01/06/2023)     Readmission Risk Interventions     No data to display

## 2023-01-19 NOTE — Discharge Summary (Addendum)
Family Medicine Teaching [facility] Discharge Summary  Patient name: [name] Medical record number: [id number] Date of birth: [birth year] Age: 82 y.o. Gender: male Date of Admission: 01/06/2023  Date of Discharge: 01/19/2023 Admitting Physician: Carney Living, MD  Primary Care Provider: Patient, No Pcp Per, plan to establish care at [facility] Consultants: Psychiatry  Indication for Hospitalization: AMS  Discharge Diagnoses/Problem List: Principal Problem for Admission: AMS Other Problems addressed during stay: Principal Problem:   Schizophrenia, chronic condition with acute exacerbation (HCC) Active Problems:   Murmur, cardiac   Urinary urgency  Brief Hospital Course:  ARHUM CAYABYAB is a 82 y.o.male with a history of schizophrenia, dementia, hypertension, hep C, hearing loss and BPH who was admitted to the family medicine teaching Service at [facility] for AMS. His hospital course is detailed below:  AMS Uncertain where patient's baseline mental status is and family did not provide collateral information initially.  He has been seen at the [facility] for a history of schizophrenia, but is not currently on medication.  CT head and neck in ED were negative for acute injury or intracranial concerns.  Mental status appeared stable during admission, likely 2/2 to schizophrenia.  Family later corroborated that he is near his baseline.  Schizophrenia Patient was mildly agitated toward staff over the course of his stay, and exhibited repeated religious delusions, paranoia surrounding finances, and fixation on past legal issues. Ultimately, it was decided he was not able to care for himself or make medical decisions appropriately, and IVC was enacted. Patient was not compliant with medication prior to admission, thus psychiatry was consulted for medication recommendations.  Psychiatry recommended Haldol 5 mg once in the morning, 10 at bedtime with as needed Haldol for agitation.  Transfer to  inpatient facilty [name] was attempted, but facility rescinded bed offer due to underlying dementia.  Placement to other facilities was unsuccessful and patient was ultimately discharged to [facility]) via taxi voucher after long acting haldol injection and on 1 month oral haldol bridge, with follow up in 1 week for 2nd shot at [facility] on 01/26/2023.  UTI  Urinary urgency Asymptomatic with malodorous urine and UA suggestive of UTI.  Received 1 dose CTX (12/12) and transition to cefadroxil (12/13-12/16).  Continued to have urinary urgency after treatment and tamsulosin was started.  Asymmetric LE Edema Concern for asymmetric lower extremity edema.  DVT ultrasound negative this resolved prior to discharge.  Other chronic conditions were medically managed with home medications and formulary alternatives as necessary   PCP Follow-up Recommendations: Psychiatry follow up recommended. Slight systolic murmur. Recommend outpatient echocardiogram. Would highly recommend audiology referral to evaluate for hearing aids. Dr. Lockie Mola will take this patient as PCP, please message her after f/u appointment. Unfortunately hydrochlorothiazide was not sent to [facility] before discharge and is sent to home pharmacy.   Disposition: Therapist, occupational [facility] facility) via taxi voucher; will have to sleep in lobby and leave in AM but patient is adamant he wants to leave  Discharge Condition: Stable, on long-acting haldol and with 1 month oral haldol bridge  Discharge Exam: Vitals:   01/18/23 0800 01/19/23 0500  BP: (!) 140/76 128/74  Pulse: 88 88  Resp: 18 20  Temp: 98.1 F (36.7 C) 98.2 F (36.8 C)  SpO2: 98% 100%   Physical Exam: General: Elderly male, stands with walker upon being addressed.  Thin, deconditioned.  Disheveled, malodorous clothes. Eyes: Scleral icterus. ENTM: MMM.  Poor  dentition. Cardiovascular: RRR.  Normal S1/2.   No murmurs/rubs/gallops. Respiratory: Normal work of breathing on room air. Gastrointestinal: Not examined. MSK: Moving all extremities appropriately, able to stand and ambulate with walker. Extremities: Cap refill less than seconds. Neuro: Alert to self, place, situation.  No focal neurological deficit. Psych: Mildly agitated, but agreeable overall.  Poor hygiene.  Moderately anxious affect.  Endorses occasional visual hallucinations, but aware they are not real.  Denies SI/HI.  Significant Procedures: None  Significant Labs and Imaging:  No results for input(s): "WBC", "HGB", "HCT", "PLT" in the last 48 hours. Recent Labs  Lab 01/18/23 0822  NA 137  K 3.9  CL 103  CO2 26  GLUCOSE 121*  BUN 16  CREATININE 1.16  CALCIUM 9.3  MG 1.8   -DVT US 12/19: Normal. -CT head 12/12: No intracranial abnormality.  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 -CT C-spine 12/12: No traumatic fracture or misalignment. -CXR 12/12: No active disease.  Results/Tests Pending at Time of Discharge: None  Discharge Medications:  Allergies as of 01/19/2023   No Known Allergies      Medication List     TAKE these medications    haloperidol 10 MG tablet Commonly known as: HALDOL Take 1 tablet (10 mg total) by mouth at bedtime.   hydrochlorothiazide 12.5 MG tablet Commonly known as: HYDRODIURIL Take 1 tablet (12.5 mg total) by mouth daily.   tamsulosin 0.4 MG Caps capsule Commonly known as: FLOMAX Take 1 capsule (0.4 mg total) by mouth daily.        Discharge Instructions: Please refer to Patient Instructions section of EMR for full details.  Patient was counseled important signs and symptoms that should prompt return to medical care, changes in medications, dietary instructions, activity restrictions, and follow up appointments.   Follow-Up Appointments: Future Appointments  Date Time Provider Department Center  01/21/2023  3:10 PM Lockie Mola, MD [facility]    Sharion Dove, Dimitry, MD 01/19/2023, 11:40 AM PGY-1, [facility] Family Medicine

## 2023-01-19 NOTE — Plan of Care (Signed)
  Problem: Education: Goal: Knowledge of General Education information will improve Description: Including pain rating scale, medication(s)/side effects and non-pharmacologic comfort measures Outcome: Progressing   Problem: Health Behavior/Discharge Planning: Goal: Ability to manage health-related needs will improve Outcome: Progressing   Problem: Clinical Measurements: Goal: Ability to maintain clinical measurements within normal limits will improve Outcome: Progressing Goal: Will remain free from infection Outcome: Progressing   Problem: Activity: Goal: Risk for activity intolerance will decrease Outcome: Progressing   Problem: Nutrition: Goal: Adequate nutrition will be maintained Outcome: Progressing   Problem: Coping: Goal: Level of anxiety will decrease Outcome: Progressing   Problem: Elimination: Goal: Will not experience complications related to bowel motility Outcome: Progressing Goal: Will not experience complications related to urinary retention Outcome: Progressing   Problem: Pain Management: Goal: General experience of comfort will improve Outcome: Progressing   Problem: Safety: Goal: Ability to remain free from injury will improve Outcome: Progressing   Problem: Skin Integrity: Goal: Risk for impaired skin integrity will decrease Outcome: Progressing

## 2023-01-19 NOTE — Plan of Care (Signed)
Attempted to call patient's sister [name] concerning patient's discharge planning, but call went straight to voicemail x2.  [facility] shelter reportedly will take patient after 8 pm, though he will have to sleep in lobby and leave in the AM.  Patient adamant he wants to leave right now and discharge placed.  Follow up appointment scheduled at [facility] 12/27 and patient aware.  Hydrochlorothiazide sent to patient's home pharmacy, as TOC did not have order.  Will attempt to call sister again later today to discuss further.
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
## 2023-01-19 NOTE — Progress Notes (Signed)
DISCHARGE NOTE HOME [name] to be discharged  [facility]  per MD order. Discussed prescriptions and follow up appointments with the patient. Prescriptions given to patient; medication list explained in detail. Patient verbalized understanding.  Skin clean, dry and intact without evidence of skin break down, no evidence of skin tears noted. IV catheter discontinued intact. Site without signs and symptoms of complications. Dressing and pressure applied. Pt denies pain at the site currently. No complaints noted.  Patient free of lines, drains, and wounds.   An After Visit Summary (AVS) was printed and given to the patient. Patient escorted via wheelchair, and discharged to [facility] via [facility]. Per [employer], they cannot take patient in until 8 pm tonight, pt. Is aware and still insisting on leaving. Pt. D/c with all belongings in stable condition  Margarita Grizzle, RN

## 2023-01-20 ENCOUNTER — Other Ambulatory Visit (HOSPITAL_COMMUNITY): Payer: Self-pay

## 2023-01-21 ENCOUNTER — Ambulatory Visit: Payer: Self-pay | Admitting: Family Medicine

## 2023-02-08 ENCOUNTER — Emergency Department (HOSPITAL_COMMUNITY)
Admission: EM | Admit: 2023-02-08 | Discharge: 2023-02-08 | Disposition: A | Discharge status: Home / Self Care | Payer: Medicare Other | Attending: Emergency Medicine | Admitting: Emergency Medicine

## 2023-02-08 ENCOUNTER — Encounter (HOSPITAL_COMMUNITY): Payer: Self-pay | Admitting: Emergency Medicine

## 2023-02-08 ENCOUNTER — Other Ambulatory Visit: Payer: Self-pay

## 2023-02-08 DIAGNOSIS — F209 Schizophrenia, unspecified: Secondary | ICD-10-CM | POA: Diagnosis present

## 2023-02-08 DIAGNOSIS — Z653 Problems related to other legal circumstances: Secondary | ICD-10-CM | POA: Insufficient documentation

## 2023-02-08 DIAGNOSIS — Z7189 Other specified counseling: Secondary | ICD-10-CM | POA: Insufficient documentation

## 2023-02-08 DIAGNOSIS — Z5900 Homelessness unspecified: Secondary | ICD-10-CM | POA: Insufficient documentation

## 2023-02-08 DIAGNOSIS — Z79899 Other long term (current) drug therapy: Secondary | ICD-10-CM | POA: Insufficient documentation

## 2023-02-08 DIAGNOSIS — Z7689 Persons encountering health services in other specified circumstances: Secondary | ICD-10-CM

## 2023-02-08 LAB — CBC WITH DIFFERENTIAL/PLATELET
Abs Immature Granulocytes: 0.02 10*3/uL (ref 0.00–0.07)
Basophils Absolute: 0 10*3/uL (ref 0.0–0.1)
Basophils Relative: 1 %
Eosinophils Absolute: 0 10*3/uL (ref 0.0–0.5)
Eosinophils Relative: 0 %
HCT: 41 % (ref 39.0–52.0)
Hemoglobin: 13.5 g/dL (ref 13.0–17.0)
Immature Granulocytes: 0 %
Lymphocytes Relative: 17 %
Lymphs Abs: 0.8 10*3/uL (ref 0.7–4.0)
MCH: 29.2 pg (ref 26.0–34.0)
MCHC: 32.9 g/dL (ref 30.0–36.0)
MCV: 88.6 fL (ref 80.0–100.0)
Monocytes Absolute: 0.2 10*3/uL (ref 0.1–1.0)
Monocytes Relative: 5 %
Neutro Abs: 3.7 10*3/uL (ref 1.7–7.7)
Neutrophils Relative %: 77 %
Platelets: 167 10*3/uL (ref 150–400)
RBC: 4.63 MIL/uL (ref 4.22–5.81)
RDW: 14.2 % (ref 11.5–15.5)
WBC: 4.9 10*3/uL (ref 4.0–10.5)
nRBC: 0 % (ref 0.0–0.2)

## 2023-02-08 LAB — COMPREHENSIVE METABOLIC PANEL
ALT: 11 U/L (ref 0–44)
AST: 17 U/L (ref 15–41)
Albumin: 3.4 g/dL — ABNORMAL LOW (ref 3.5–5.0)
Alkaline Phosphatase: 97 U/L (ref 38–126)
Anion gap: 3 — ABNORMAL LOW (ref 5–15)
BUN: 9 mg/dL (ref 8–23)
CO2: 21 mmol/L — ABNORMAL LOW (ref 22–32)
Calcium: 8.7 mg/dL — ABNORMAL LOW (ref 8.9–10.3)
Chloride: 111 mmol/L (ref 98–111)
Creatinine, Ser: 0.88 mg/dL (ref 0.61–1.24)
GFR, Estimated: >60 mL/min (ref >60)
Glucose, Bld: 101 mg/dL — ABNORMAL HIGH (ref 70–99)
Potassium: 3.2 mmol/L — ABNORMAL LOW (ref 3.5–5.1)
Sodium: 135 mmol/L (ref 135–145)
Total Bilirubin: 1 mg/dL (ref 0.0–1.2)
Total Protein: 8 g/dL (ref 6.5–8.1)

## 2023-02-08 LAB — RAPID URINE DRUG SCREEN, HOSP PERFORMED
Amphetamines: NOT DETECTED
Barbiturates: NOT DETECTED
Benzodiazepines: NOT DETECTED
Cocaine: NOT DETECTED
Opiates: NOT DETECTED
Tetrahydrocannabinol: NOT DETECTED

## 2023-02-08 LAB — ETHANOL: Alcohol, Ethyl (B): <10 mg/dL (ref <10)
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
## 2023-02-18 ENCOUNTER — Encounter: Payer: Self-pay | Admitting: Student

## 2023-02-18 ENCOUNTER — Ambulatory Visit: Payer: Medicare Other | Admitting: Student

## 2023-02-18 VITALS — BP 121/98 | HR 84 | Ht 68.0 in | Wt 176.2 lb

## 2023-02-18 DIAGNOSIS — Z609 Problem related to social environment, unspecified: Secondary | ICD-10-CM

## 2023-02-18 DIAGNOSIS — F250 Schizoaffective disorder, bipolar type: Secondary | ICD-10-CM

## 2023-02-18 MED ORDER — HALOPERIDOL 10 MG PO TABS: 10.0000 mg | ORAL_TABLET | Freq: Every day | ORAL | 4 refills | Status: DC

## 2023-02-18 MED ORDER — TAMSULOSIN HCL 0.4 MG PO CAPS: 0.4000 mg | ORAL_CAPSULE | Freq: Every day | ORAL | 4 refills | Status: DC

## 2023-02-18 NOTE — Assessment & Plan Note (Addendum)
Patient comes in for social issues.  Patient requesting social worker, as he needs help with obtaining his VA/Social Security benefits.  Patient also has check for $10,000, that he would like evaluated, and possibly converting to a debit card.  Patient appreciates that he lives with his sister currently, and has been taking his meds regularly.  Patient agrees for Child psychotherapist to contact sister, to get a hold of him.  Will place care management order for social worker. - Care management for social worker

## 2023-02-18 NOTE — Patient Instructions (Addendum)
It was great to see you! Thank you for allowing me to participate in your care!  I recommend that you always bring your medications to each appointment as this makes it easy to ensure we are on the correct medications and helps [place] not miss when refills are needed.  Our plans for today:  - Child psychotherapist We are going to have a Quarry manager you. They will likely call your sister, to get a hold of you.   - Medications  I'm sending a refill of your medications to [facility]   Address:  [address] [place]   Phone:  [phone]  - Depression  Go to [facility], so they can adjust your medicine for your depression.    Address: [address] [place] [address]  Phone:  [phone]  Take care and seek immediate care sooner if you develop any concerns.   Dr. Bess Kinds, MD [facility] Medicine

## 2023-02-18 NOTE — Progress Notes (Signed)
  SUBJECTIVE:   CHIEF COMPLAINT / HPI:   ED F/u? -1/14 went to ED looking for case manager, was unable to speak with one and told to come back -12/12 admission for AMS r/o, started on long acting haldol. F/u qtc, hydrochlorothiazide, echo for systolic murmur?  Today: Looking for SW and med refill.   Need's SW for issu w/ Check. Received a check for 10K, needs help negotiating with company about check, as it's expired. Is also no longer receiving his social security benefits. Is trying to get a debit card for VA benefits and money from "[employer]" who sent the 10 K check.   Depression Notes he is severely depressed but denies any SI, says he believes in jesus and the holy spirit, and that keeps him going. He has no plans to harm himself or end his life.  PERTINENT  PMH / PSH:    OBJECTIVE:  BP (!) 121/98   Pulse 84   Ht 5\' 8"  (1.727 m)   Wt 176 lb 3.2 oz (79.9 kg)   SpO2 100%   BMI 26.79 kg/m  Physical Exam Psychiatric:        Attention and Perception: Attention normal.        Mood and Affect: Mood and affect normal. Mood is not anxious, depressed or elated. Affect is not labile, blunt, flat, angry, tearful or inappropriate.        Speech: Speech normal. Speech is not rapid and pressured, delayed, slurred or tangential.        Behavior: Behavior normal. Behavior is not agitated, slowed, aggressive or withdrawn. Behavior is cooperative.        Thought Content: Thought content does not include suicidal ideation. Thought content does not include suicidal plan.      ASSESSMENT/PLAN:   Assessment & Plan Social problem Patient comes in for social issues.  Patient requesting social worker, as he needs help with obtaining his VA/Social Security benefits.  Patient also has check for $10,000, that he would like evaluated, and possibly converting to a debit card.  Patient appreciates that he lives with his sister currently, and has been taking his meds regularly.  Patient agrees for Arts development officer to contact sister, to get a hold of him.  Will place care management order for social worker. - Care management for social worker Schizoaffective disorder, bipolar type [facility]) Patient comes in noting that he is in severe depression.  The patient denies any active or passive suicidal ideation.  Patient says he believes in [place], and feels encouraged and that he does not have plans to harm himself or end his life.  Given patient's complex psychiatric history, will direct towards the [facility].  Resources given for patient to go to [facility]. -Follow-up at [facility] No follow-ups on file. Bess Kinds, MD 02/18/2023, 1:53 PM PGY-3, [facility] Medicine

## 2023-02-18 NOTE — Assessment & Plan Note (Addendum)
Patient comes in noting that he is in severe depression.  The patient denies any active or passive suicidal ideation.  Patient says he believes in [place], and feels encouraged and that he does not have plans to harm himself or end his life.  Given patient's complex psychiatric history, will direct towards the [facility].  Resources given for patient to go to [facility]. -Follow-up at [facility]

## 2023-02-21 ENCOUNTER — Telehealth: Payer: Self-pay | Admitting: *Deleted

## 2023-02-21 NOTE — Progress Notes (Signed)
Complex Care Management Note  Care Guide Note 02/21/2023 Name: [name] MRN: [id number] DOB: October 04, 1940  [name] is a 83 y.o. year old male who sees Bess Kinds, MD for primary care. I reached out to Baldwin Crown by phone today to offer complex care management services.  [name] was given information about Complex Care Management services today including:   The Complex Care Management services include support from the care team which includes your Nurse Coordinator, Clinical Social Worker, or Pharmacist.  The Complex Care Management team is here to help remove barriers to the health concerns and goals most important to you. Complex Care Management services are voluntary, and the patient may decline or stop services at any time by request to their care team member.   Complex Care Management Consent Status: Patient agreed to services and verbal consent obtained.   Follow up plan:  Telephone appointment with complex care management team member scheduled for:  1/29  Encounter Outcome:  Patient Scheduled  Gwenevere Ghazi  [facility]  [facility], [facility] Guide  Direct Dial: [phone]  Fax [phone]

## 2023-02-23 ENCOUNTER — Telehealth: Payer: Self-pay | Admitting: Student

## 2023-02-23 ENCOUNTER — Ambulatory Visit: Payer: No Typology Code available for payment source | Admitting: Licensed Clinical Social Worker

## 2023-02-23 NOTE — Patient Outreach (Signed)
  Care Coordination   Initial Visit Note   02/23/2023 Name: [name] MRN: [id number] DOB: [birth year]  [name] is a 83 y.o. year old male who sees Bess Kinds, MD for primary care. I spoke with  Baldwin Crown and sister [name] by phone today.  What matters to the patients health and wellness today?  Pt requested assistance with medication cost, PCA and transportation services. Mr. Intriago currently resides with sister [name]. According to Ms. McQuire pt has access to transportation through family. Mr. Encinas has active VA medical benefits according to sister and would like [facility] be [facility] with [facility] through mail order.  I encourage Mr. Denardo and Ms. McQuire to seek services with audiology due to pt severe hearing impairment.    Goals Addressed             This Visit's Progress    Care Coordination       Activities and task to complete in order to accomplish goals.   Complete SCAT application LCSW A. Kainoah Bartosiewicz compted application  Continue with compliance of taking medication prescribed by Doctor  Referral place with pharmacy to assist to medication cost concerns.  Collaborate with PCP and VA regarding concerns with hearing and pertinent medical questions.  Contact [facility] regarding assistance with PCA services [phone].          SDOH assessments and interventions completed:  Yes  SDOH Interventions Today    Flowsheet Row Most Recent Value  SDOH Interventions   Food Insecurity Interventions Intervention Not Indicated  Housing Interventions --  [Per pt sister Ms. McQuire pt has a safe place to stay in her home.]  Transportation Interventions Patient Resources (Friends/Family), SCAT (Specialized Community Area Transporation)  [per pt sister Ms. McQuire she transports pt to appts. LCSW A. Harlan Vinal submitted application for SCAT services to help pt transport locally to medical appts.]   Utilities Interventions Intervention Not Indicated        Care Coordination Interventions:  Yes, provided  Interventions Today    Flowsheet Row Most Recent Value  Education Interventions   Education Provided Provided Education  Provided Verbal Education On Other  [Encouraged sister to consider home health to assist with caring for Mr. Stroot w tasks bathing and light housekeeping. Encouraged sister to contact [facility] [phone].]  Mental Health Interventions   Mental Health Discussed/Reviewed Mental Health Discussed, Mental Health Reviewed, Depression  [Pt reports depressed mood, and unable to take meds due to financial constriants. Referral placed with pharmacy team member to further assist pt.]  Pharmacy Interventions   Pharmacy Dicussed/Reviewed Affording Medications, Pharmacy Topics Reviewed  [Pt reports unable to afford meds. Pt sister verfied pt has active VA benefits. Medassist application sent to address on file and prescribing physican contacted via epic requeesting prescription sent to [facility] pharmacy.]       Follow up plan: Follow up call scheduled for 03/09/2023    Encounter Outcome:  Patient Visit Completed   Gwyndolyn Saxon MSW, LCSW Licensed Clinical Social Worker  [facility], Population Health Direct Dial: [phone]  Fax: [phone]

## 2023-02-23 NOTE — Patient Instructions (Signed)
Visit Information  Thank you for taking time to visit with me today. Please don't hesitate to contact me if I can be of assistance to you.   Following are the goals we discussed today:   Goals Addressed             This Visit's Progress    Care Coordination       Activities and task to complete in order to accomplish goals.   Complete SCAT application LCSW A. Kyler Lerette compted application  Continue with compliance of taking medication prescribed by Doctor  Referral place with pharmacy to assist to medication cost concerns.  Collaborate with PCP and VA regarding concerns with hearing and pertinent medical questions.  Contact [facility] regarding assistance with PCA services [phone].          Our next appointment is by telephone on 03/09/2023 at 10am  Please call the care guide team at [phone] if you need to cancel or reschedule your appointment.   If you are experiencing a Mental Health or Behavioral Health Crisis or need someone to talk to, please call the Suicide and Crisis Lifeline: 988 go to [facility] Urgent [facility] [address] [place] [phone]) call 911   Patient verbalizes understanding of instructions and care plan provided today and agrees to view in MyChart. Active MyChart status and patient understanding of how to access instructions and care plan via MyChart confirmed with patient.     Telephone follow up appointment with care management team member scheduled for:03/09/2023  Gwyndolyn Saxon MSW, LCSW Licensed Clinical Social Worker  [facility], Population Health Direct Dial: [phone]  Fax: [phone]

## 2023-02-23 NOTE — Telephone Encounter (Signed)
Good Afternoon Dr. Barbaraann Faster.Marland KitchenMarland KitchenMarland KitchenI received a referral for to assist Mr. Gibbons. I could not assist Mr. Branam with financial matters concerning instruments of payment. However I spoke with Mr. Lembo and his sister Biagio Borg. There was prescription sent 02/18/2023, I called [facility] pharmacy to inquire about cost. The patient is unable to pick up the medication due to financial constraints. I was informed by the pharmacy rep via phone at [facility] the prescribing physician will have to re submit the medication to be filled at [facility] pharmacy in [place] and request the medication to mailed to the verified address on file. Hopefully this should help with receiving his medication at low or no cost to Mr. Whobrey. Please let me know should you have any questions. .....Marland KitchenMarland KitchenThank you Gwyndolyn Saxon MSW LCSW

## 2023-02-25 ENCOUNTER — Telehealth: Payer: Self-pay | Admitting: Pharmacist

## 2023-02-25 NOTE — Telephone Encounter (Signed)
-----   Message from [employer] W sent at 02/24/2023 12:00 PM EST ----- Hello,  I received a message stating patient needs assistance with Medication Cost.  Thank you Penne Lash , RMA     [facility]  [facility], [facility] Guide  Direct Dial: [phone]  Website: [email or web address] [image]

## 2023-02-25 NOTE — Telephone Encounter (Signed)
Attempted to contact patient for follow-up of request for medication assistance.   No answer X 2   Patient med list is short and should be relatively low cost.   Patient is meeting with Gwyndolyn Saxon, LCSW on 2/12 TWO thoughts at that time or sooner if patient reaches out.  - Medicaid enrollment potential vs. LIS enrollment - Use of [facility] for support.    Total time with patient call and documentation of interaction: 12 minutes.  Follow-up phone call planned: 2/13 if patient fails to keep LCSW appointment.

## 2023-02-26 ENCOUNTER — Other Ambulatory Visit: Payer: Self-pay

## 2023-02-26 ENCOUNTER — Encounter (HOSPITAL_COMMUNITY): Payer: Self-pay

## 2023-02-26 ENCOUNTER — Emergency Department (HOSPITAL_COMMUNITY): Payer: No Typology Code available for payment source

## 2023-02-26 ENCOUNTER — Emergency Department (HOSPITAL_COMMUNITY)
Admission: EM | Admit: 2023-02-26 | Discharge: 2023-02-26 | Disposition: A | Discharge status: Home / Self Care | Payer: No Typology Code available for payment source | Attending: Emergency Medicine | Admitting: Emergency Medicine

## 2023-02-26 DIAGNOSIS — R55 Syncope and collapse: Secondary | ICD-10-CM

## 2023-02-26 DIAGNOSIS — Z5900 Homelessness unspecified: Secondary | ICD-10-CM | POA: Diagnosis not present

## 2023-02-26 DIAGNOSIS — R451 Restlessness and agitation: Secondary | ICD-10-CM

## 2023-02-26 LAB — CBC
HCT: 45.2 % (ref 39.0–52.0)
Hemoglobin: 14.4 g/dL (ref 13.0–17.0)
MCH: 28.7 pg (ref 26.0–34.0)
MCHC: 31.9 g/dL (ref 30.0–36.0)
MCV: 90 fL (ref 80.0–100.0)
Platelets: 254 10*3/uL (ref 150–400)
RBC: 5.02 MIL/uL (ref 4.22–5.81)
RDW: 14.3 % (ref 11.5–15.5)
WBC: 5.3 10*3/uL (ref 4.0–10.5)
nRBC: 0 % (ref 0.0–0.2)

## 2023-02-26 LAB — BASIC METABOLIC PANEL
Anion gap: 10 (ref 5–15)
BUN: 11 mg/dL (ref 8–23)
CO2: 24 mmol/L (ref 22–32)
Calcium: 9.4 mg/dL (ref 8.9–10.3)
Chloride: 105 mmol/L (ref 98–111)
Creatinine, Ser: 1.1 mg/dL (ref 0.61–1.24)
GFR, Estimated: >60 mL/min (ref >60)
Glucose, Bld: 96 mg/dL (ref 70–99)
Potassium: 4.2 mmol/L (ref 3.5–5.1)
Sodium: 139 mmol/L (ref 135–145)

## 2023-02-26 LAB — CBG MONITORING, ED: Glucose-Capillary: 100 mg/dL — ABNORMAL HIGH (ref 70–99)

## 2023-02-26 MED ORDER — LORAZEPAM 2 MG/ML IJ SOLN
1.0000 mg | Freq: Once | INTRAMUSCULAR | Status: AC
  Administered 2023-02-26: 1 mg via INTRAMUSCULAR
  Filled 2023-02-26: qty 1

## 2023-02-26 MED ORDER — HALOPERIDOL LACTATE 5 MG/ML IJ SOLN
5.0000 mg | Freq: Once | INTRAMUSCULAR | Status: AC
Start: 2023-02-26 — End: 2023-02-26
  Administered 2023-02-26: 5 mg via INTRAMUSCULAR
  Filled 2023-02-26: qty 1

## 2023-02-26 NOTE — ED Notes (Signed)
Pt walking and wandering around lobby,

## 2023-02-26 NOTE — ED Provider Notes (Signed)
[facility] AT [facility] Provider Note   CSN: [id number] Arrival date & time: 02/26/23  1201     History  Chief Complaint  Patient presents with   Loss of Consciousness   [name] is a 83 y.o. male with past medical history significant for schizoaffective disorder, homelessness, altered mental status who presents with complaints of "blackout spells" for the the last 6 months intermittently as well as urinary urgency.  He denies any pain.  On attempted initial evaluation patient is yelling in the hallway about false arrest, and appropriate arrest, will not discuss any possible medical problem.  He has been seen previously for similar.  Last note in December of last year reported recent homelessness.  He has been previously prescribed Haldol, he cannot tell me whether or not he is taking it.   Loss of Consciousness      Home Medications Prior to Admission medications   Medication Sig Start Date End Date Taking? Authorizing Provider  haloperidol (HALDOL) 10 MG tablet Take 1 tablet (10 mg total) by mouth at bedtime. 02/18/23   Bess Kinds, MD  hydrochlorothiazide (HYDRODIURIL) 12.5 MG tablet Take 1 tablet (12.5 mg total) by mouth daily. 01/19/23   Shitarev, Dimitry, MD  tamsulosin (FLOMAX) 0.4 MG CAPS capsule Take 1 capsule (0.4 mg total) by mouth daily. 02/18/23   Bess Kinds, MD      Allergies    Patient has no known allergies.    Review of Systems   Review of Systems  Cardiovascular:  Positive for syncope.  All other systems reviewed and are negative.   Physical Exam Updated Vital Signs BP 126/87   Pulse 80   Temp 97.6 F (36.4 C) (Axillary)   Resp 18   SpO2 100%  Physical Exam Vitals and nursing note reviewed.  Constitutional:      General: He is not in acute distress.    Appearance: Normal appearance.     Comments: Disheveled, foul-smelling  HENT:     Head: Normocephalic and atraumatic.  Eyes:     General:         Right eye: No discharge.        Left eye: No discharge.  Cardiovascular:     Rate and Rhythm: Normal rate and regular rhythm.     Heart sounds: No murmur heard.    No friction rub. No gallop.  Pulmonary:     Effort: Pulmonary effort is normal.     Breath sounds: Normal breath sounds.  Abdominal:     General: Bowel sounds are normal.     Palpations: Abdomen is soft.  Skin:    General: Skin is warm and dry.     Capillary Refill: Capillary refill takes less than 2 seconds.  Neurological:     Mental Status: He is alert.     Comments: Confused, responding to internal stimuli, yelling, not responding to request to moved to his room and have a calm conversation  Psychiatric:     Comments: Aggressive and yelling behavior, not easily directable, denies SI, HI     ED Results / Procedures / Treatments   Labs (all labs ordered are listed, but only abnormal results are displayed) Labs Reviewed  CBG MONITORING, ED - Abnormal; Notable for the following components:      Result Value   Glucose-Capillary 100 (*)    All other components within normal limits  BASIC METABOLIC PANEL  CBC  URINALYSIS, ROUTINE W REFLEX MICROSCOPIC  EKG EKG Interpretation Date/Time:  Saturday February 26 2023 12:52:33 EST Ventricular Rate:  82 PR Interval:  136 QRS Duration:  70 QT Interval:  348 QTC Calculation: 406 R Axis:   -10  Text Interpretation: Sinus rhythm with Premature atrial complexes with Abberant conduction Nonspecific ST and T wave abnormality new frequent PAC's When compared with ECG of 06-Jan-2023 11:15, PREVIOUS ECG IS PRESENT Confirmed by Gwyneth Sprout ([id number]) on 02/26/2023 4:37:19 PM  Radiology CT Head Wo Contrast Result Date: 02/26/2023 CLINICAL DATA:  Mental status change, unknown cause EXAM: CT HEAD WITHOUT CONTRAST TECHNIQUE: Contiguous axial images were obtained from the base of the skull through the vertex without intravenous contrast. RADIATION DOSE REDUCTION: This exam was  performed according to the departmental dose-optimization program which includes automated exposure control, adjustment of the mA and/or kV according to patient size and/or use of iterative reconstruction technique. COMPARISON:  01/06/2023 FINDINGS: Brain: No evidence of acute infarction, hemorrhage, hydrocephalus, extra-axial collection or mass lesion/mass effect. Patchy low-density changes within the periventricular and subcortical white matter most compatible with chronic microvascular ischemic change. Mild diffuse cerebral volume loss. Vascular: No hyperdense vessel or unexpected calcification. Skull: Normal. Negative for fracture or focal lesion. Sinuses/Orbits: No acute finding. Other: None. IMPRESSION: 1. No acute intracranial findings. 2. 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 Electronically Signed   By: Duanne Guess D.O.   On: 02/26/2023 13:24    Procedures Procedures    Medications Ordered in ED Medications  haloperidol lactate (HALDOL) injection 5 mg (5 mg Intramuscular Given 02/26/23 1659)  LORazepam (ATIVAN) injection 1 mg (1 mg Intramuscular Given 02/26/23 1700)    ED Course/ Medical Decision Making/ A&P                                 Medical Decision Making Amount and/or Complexity of Data Reviewed Labs: ordered.  Risk Prescription drug management.   This patient is a 83 y.o. male  who presents to the ED for concern of syncope, urinary urgency, psychiatric issue.   Differential diagnoses prior to evaluation: The emergent differential diagnosis includes, but is not limited to,  CVA, ACS, arrhythmia, vasovagal syncope, orthostatic hypotension, sepsis, hypoglycemia, electrolyte disturbance, respiratory failure, symptomatic anemia, dehydration, heat injury, polypharmacy, malignancy, anxiety/panic attack, UTI, pyelonephritis, nephrolithiasis, STI, decompensated schizophrenia, schizoaffective disorder, versus other psychosis.. This is not an exhaustive  differential.   Past Medical History / Co-morbidities / Social History: Schizoaffective disorder, hepatitis C, homelessness  Additional history: Chart reviewed. Pertinent results include: Reviewed lab work, imaging from recent previous emergency department visits  Physical Exam: Physical exam performed. The pertinent findings include:  Confused, responding to internal stimuli, yelling, not responding to request to moved to his room and have a calm conversation   Aggressive and yelling behavior, not easily directable, denies SI, HI   Lab Tests/Imaging studies: I personally interpreted labs/imaging and the pertinent results include: CBC unremarkable, CBC unremarkable, BMP unremarkable.  CT head without contrast independently interpreted by myself with no evidence of acute intracranial abnormality.  Patient has been unable to provide [place] a UA.. I agree with the radiologist interpretation.  Cardiac monitoring: EKG obtained and interpreted by myself and attending physician which shows: Normal sinus rhythm, no prolonged QT.   Medications: I ordered medication including Haldol, Ativan for agitation in the emergency department.  I have reviewed the patients home medicines and have made adjustments as needed.   Disposition: After consideration of  the diagnostic results and the patients response to treatment, I feel that on reassessment patient with no complaints, psychiatric outburst has improved, he would like to go home, I think that he is stable for discharge, I suspect that his complaints are related to his poorly controlled psychosis unremarkable..   emergency department workup does not suggest an emergent condition requiring admission or immediate intervention beyond what has been performed at this time. The plan is: as above. The patient is safe for discharge and has been instructed to return immediately for worsening symptoms, change in symptoms or any other concerns.  Final Clinical  Impression(s) / ED Diagnoses Final diagnoses:  Near syncope  Agitation    Rx / DC Orders ED Discharge Orders     None         [place] 02/26/23 1913    Gwyneth Sprout, MD 02/28/23 0006

## 2023-02-26 NOTE — ED Notes (Signed)
 Patient is resting comfortably.

## 2023-02-26 NOTE — ED Provider Triage Note (Signed)
Emergency Medicine Provider Triage Evaluation Note  [name] , a 83 y.o. male  was evaluated in triage.  Pt complains of multiple problems. States he has been having blackout spells for the last 6 months.  States he has been dizzy and 'going blind' for the past 6 months. States he has fallen and hit his head as well. Also notes he is having problems with his prostate, notes he is having urinary urgency.  No dysuria.  Review of Systems  Positive:  Negative:   Physical Exam  BP 121/83   Pulse 84   Temp 97.9 F (36.6 C)   Resp 12   SpO2 98%  Gen:   Awake, no distress   Resp:  Normal effort  MSK:   Moves extremities without difficulty  Other:    Medical Decision Making  Medically screening exam initiated at 12:49 PM.  Appropriate orders placed.  ALESSIO BOGAN was informed that the remainder of the evaluation will be completed by another provider, this initial triage assessment does not replace that evaluation, and the importance of remaining in the ED until their evaluation is complete.     Silva Bandy, PA-C 02/26/23 1251

## 2023-02-26 NOTE — ED Triage Notes (Signed)
Pt c.o "black out spells" for the past 6 months. Pt also c.o "urgency problem". Pt denies pain.

## 2023-02-27 ENCOUNTER — Inpatient Hospital Stay (HOSPITAL_COMMUNITY)
Admission: EM | Admit: 2023-02-27 | Discharge: 2023-03-10 | DRG: 682 | Disposition: A | Discharge status: Other Acute Inpatient Hospital | Payer: No Typology Code available for payment source | Attending: Family Medicine | Admitting: Family Medicine

## 2023-02-27 ENCOUNTER — Other Ambulatory Visit: Payer: Self-pay

## 2023-02-27 ENCOUNTER — Emergency Department (HOSPITAL_COMMUNITY)
Admission: EM | Admit: 2023-02-27 | Discharge: 2023-02-27 | Discharge status: Against Medical Advice | Payer: No Typology Code available for payment source

## 2023-02-27 ENCOUNTER — Encounter (HOSPITAL_COMMUNITY): Payer: Self-pay

## 2023-02-27 DIAGNOSIS — N400 Enlarged prostate without lower urinary tract symptoms: Secondary | ICD-10-CM | POA: Diagnosis present

## 2023-02-27 DIAGNOSIS — Z91148 Patient's other noncompliance with medication regimen for other reason: Secondary | ICD-10-CM

## 2023-02-27 DIAGNOSIS — N179 Acute kidney failure, unspecified: Principal | ICD-10-CM | POA: Diagnosis present

## 2023-02-27 DIAGNOSIS — I110 Hypertensive heart disease with heart failure: Secondary | ICD-10-CM | POA: Diagnosis present

## 2023-02-27 DIAGNOSIS — R270 Ataxia, unspecified: Secondary | ICD-10-CM

## 2023-02-27 DIAGNOSIS — Z79899 Other long term (current) drug therapy: Secondary | ICD-10-CM

## 2023-02-27 DIAGNOSIS — Z87891 Personal history of nicotine dependence: Secondary | ICD-10-CM

## 2023-02-27 DIAGNOSIS — B9620 Unspecified Escherichia coli [E. coli] as the cause of diseases classified elsewhere: Secondary | ICD-10-CM | POA: Diagnosis present

## 2023-02-27 DIAGNOSIS — Z5900 Homelessness unspecified: Secondary | ICD-10-CM

## 2023-02-27 DIAGNOSIS — E43 Unspecified severe protein-calorie malnutrition: Secondary | ICD-10-CM | POA: Insufficient documentation

## 2023-02-27 DIAGNOSIS — R011 Cardiac murmur, unspecified: Secondary | ICD-10-CM | POA: Diagnosis present

## 2023-02-27 DIAGNOSIS — N3000 Acute cystitis without hematuria: Secondary | ICD-10-CM

## 2023-02-27 DIAGNOSIS — I350 Nonrheumatic aortic (valve) stenosis: Secondary | ICD-10-CM | POA: Diagnosis present

## 2023-02-27 DIAGNOSIS — R000 Tachycardia, unspecified: Secondary | ICD-10-CM | POA: Insufficient documentation

## 2023-02-27 DIAGNOSIS — R079 Chest pain, unspecified: Secondary | ICD-10-CM

## 2023-02-27 DIAGNOSIS — I471 Supraventricular tachycardia, unspecified: Secondary | ICD-10-CM | POA: Diagnosis not present

## 2023-02-27 DIAGNOSIS — R8290 Unspecified abnormal findings in urine: Secondary | ICD-10-CM | POA: Diagnosis present

## 2023-02-27 DIAGNOSIS — E875 Hyperkalemia: Secondary | ICD-10-CM | POA: Diagnosis present

## 2023-02-27 DIAGNOSIS — R451 Restlessness and agitation: Secondary | ICD-10-CM

## 2023-02-27 DIAGNOSIS — Z9152 Personal history of nonsuicidal self-harm: Secondary | ICD-10-CM

## 2023-02-27 DIAGNOSIS — I4510 Unspecified right bundle-branch block: Secondary | ICD-10-CM | POA: Diagnosis present

## 2023-02-27 DIAGNOSIS — N390 Urinary tract infection, site not specified: Secondary | ICD-10-CM | POA: Diagnosis present

## 2023-02-27 DIAGNOSIS — F250 Schizoaffective disorder, bipolar type: Secondary | ICD-10-CM | POA: Diagnosis present

## 2023-02-27 DIAGNOSIS — I5031 Acute diastolic (congestive) heart failure: Secondary | ICD-10-CM | POA: Diagnosis present

## 2023-02-27 DIAGNOSIS — R1310 Dysphagia, unspecified: Secondary | ICD-10-CM

## 2023-02-27 DIAGNOSIS — N433 Hydrocele, unspecified: Secondary | ICD-10-CM | POA: Diagnosis present

## 2023-02-27 DIAGNOSIS — F209 Schizophrenia, unspecified: Secondary | ICD-10-CM | POA: Diagnosis present

## 2023-02-27 DIAGNOSIS — H9190 Unspecified hearing loss, unspecified ear: Secondary | ICD-10-CM | POA: Diagnosis present

## 2023-02-27 DIAGNOSIS — F0390 Unspecified dementia without behavioral disturbance: Secondary | ICD-10-CM | POA: Diagnosis present

## 2023-02-27 HISTORY — DX: Acute kidney failure, unspecified: N17.9

## 2023-02-27 LAB — BASIC METABOLIC PANEL
Anion gap: 12 (ref 5–15)
Anion gap: 10 (ref 5–15)
BUN: 19 mg/dL (ref 8–23)
BUN: 18 mg/dL (ref 8–23)
CO2: 22 mmol/L (ref 22–32)
CO2: 24 mmol/L (ref 22–32)
Calcium: 9.4 mg/dL (ref 8.9–10.3)
Calcium: 9.4 mg/dL (ref 8.9–10.3)
Chloride: 104 mmol/L (ref 98–111)
Chloride: 105 mmol/L (ref 98–111)
Creatinine, Ser: 1.32 mg/dL — ABNORMAL HIGH (ref 0.61–1.24)
Creatinine, Ser: 1.33 mg/dL — ABNORMAL HIGH (ref 0.61–1.24)
GFR, Estimated: 55 mL/min — ABNORMAL LOW (ref >60)
GFR, Estimated: 54 mL/min — ABNORMAL LOW (ref >60)
Glucose, Bld: 80 mg/dL (ref 70–99)
Glucose, Bld: 83 mg/dL (ref 70–99)
Potassium: 5.6 mmol/L — ABNORMAL HIGH (ref 3.5–5.1)
Potassium: 4.5 mmol/L (ref 3.5–5.1)
Sodium: 138 mmol/L (ref 135–145)
Sodium: 139 mmol/L (ref 135–145)

## 2023-02-27 LAB — CBC
HCT: 46 % (ref 39.0–52.0)
Hemoglobin: 14.9 g/dL (ref 13.0–17.0)
MCH: 29 pg (ref 26.0–34.0)
MCHC: 32.4 g/dL (ref 30.0–36.0)
MCV: 89.5 fL (ref 80.0–100.0)
Platelets: 237 10*3/uL (ref 150–400)
RBC: 5.14 MIL/uL (ref 4.22–5.81)
RDW: 14.4 % (ref 11.5–15.5)
WBC: 5.6 10*3/uL (ref 4.0–10.5)
nRBC: 0 % (ref 0.0–0.2)

## 2023-02-27 LAB — RAPID URINE DRUG SCREEN, HOSP PERFORMED
Amphetamines: NOT DETECTED
Barbiturates: NOT DETECTED
Benzodiazepines: POSITIVE — AB
Cocaine: NOT DETECTED
Opiates: NOT DETECTED
Tetrahydrocannabinol: NOT DETECTED

## 2023-02-27 LAB — URINALYSIS, ROUTINE W REFLEX MICROSCOPIC
Bilirubin Urine: NEGATIVE
Glucose, UA: NEGATIVE mg/dL
Hgb urine dipstick: NEGATIVE
Ketones, ur: 20 mg/dL — AB
Nitrite: POSITIVE — AB
Protein, ur: 100 mg/dL — AB
Specific Gravity, Urine: 1.021 (ref 1.005–1.030)
WBC, UA: >50 WBC/hpf (ref 0–5)
pH: 6 (ref 5.0–8.0)

## 2023-02-27 LAB — TROPONIN I (HIGH SENSITIVITY)
Troponin I (High Sensitivity): 2 ng/L (ref <18)
Troponin I (High Sensitivity): 3 ng/L (ref <18)

## 2023-02-27 LAB — TSH: TSH: 1.526 u[IU]/mL (ref 0.350–4.500)

## 2023-02-27 LAB — CBG MONITORING, ED: Glucose-Capillary: 95 mg/dL (ref 70–99)

## 2023-02-27 MED ORDER — CEFADROXIL 500 MG PO CAPS
500.0000 mg | ORAL_CAPSULE | Freq: Two times a day (BID) | ORAL | Status: AC
  Administered 2023-02-27 – 2023-03-04 (×10): 500 mg via ORAL
  Filled 2023-02-27 (×11): qty 1

## 2023-02-27 MED ORDER — ACETAMINOPHEN 650 MG RE SUPP: 650.0000 mg | Freq: Four times a day (QID) | RECTAL | Status: DC | PRN

## 2023-02-27 MED ORDER — TAMSULOSIN HCL 0.4 MG PO CAPS
0.4000 mg | ORAL_CAPSULE | Freq: Every day | ORAL | Status: DC
  Administered 2023-02-28 – 2023-03-10 (×8): 0.4 mg via ORAL
  Filled 2023-02-27 (×10): qty 1

## 2023-02-27 MED ORDER — HALOPERIDOL 5 MG PO TABS
10.0000 mg | ORAL_TABLET | Freq: Every day | ORAL | Status: DC
  Administered 2023-02-27 – 2023-02-28 (×2): 10 mg via ORAL
  Filled 2023-02-27 (×3): qty 2

## 2023-02-27 MED ORDER — SODIUM CHLORIDE 0.9 % IV SOLN
1.0000 g | Freq: Once | INTRAVENOUS | Status: AC
  Administered 2023-02-27: 1 g via INTRAVENOUS
  Filled 2023-02-27: qty 10

## 2023-02-27 MED ORDER — ACETAMINOPHEN 325 MG PO TABS: 650.0000 mg | ORAL_TABLET | Freq: Four times a day (QID) | ORAL | Status: DC | PRN

## 2023-02-27 MED ORDER — ENOXAPARIN SODIUM 40 MG/0.4ML IJ SOSY
40.0000 mg | PREFILLED_SYRINGE | Freq: Every day | INTRAMUSCULAR | Status: DC
  Administered 2023-02-27 – 2023-03-09 (×10): 40 mg via SUBCUTANEOUS
  Filled 2023-02-27 (×10): qty 0.4

## 2023-02-27 MED ORDER — SODIUM CHLORIDE 0.9 % IV BOLUS
1000.0000 mL | Freq: Once | INTRAVENOUS | Status: AC
  Administered 2023-02-27: 1000 mL via INTRAVENOUS

## 2023-02-27 NOTE — ED Triage Notes (Signed)
Pt returns today for continuous complaints of "black out spells" and hearing problems.

## 2023-02-27 NOTE — Care Management (Addendum)
Transition of Care [facility]) - Emergency Department Mini Assessment   Patient Details  Name: [name] MRN: [id number] Date of Birth: 1940/09/08  Transition of Care [facility] CM/SW Contact:    Lockie Pares, RN Phone Number: 02/27/2023, 10:11 AM   Clinical Narrative:  Asked to speak to [facility]. Patient is walking with a shuffling gait. Is speaking about court cases and being wrongfully accussed. He states he needs a police report so he can get housing. He says he was living with his sister, but there was no peace there. He has a backpack and warm clothing on. He stated he has been found in field before and worked in a [employer] in [place] but doesn't know how he got the job. He stated he walked all the way from [place] to [place] and kept getting arrested but did not do anything wrong.  He has checked back in, and spoke to police here on site.  Called [name] his sister with his permission , left message [phone] Recalled [name], forwarded to voicemail.  Left confidential message Homeless resources added to AVS 1500 [name] called back She states patient can come stay with her., She states she has set him up with the [name] in March and April. He has progressively gotten worse with his memory  She can be texted when he is discharged and pick him up/   ED Mini Assessment: What brought you to the Emergency Department? : Black out spells  Barriers to Discharge: Homeless with medical needs  Barrier interventions: Called sister left message          Patient Contact and Communications  Sister [name]      ,                 Admission diagnosis:  medical clearance Patient Active Problem List   Diagnosis Date Noted   Social problem 02/18/2023   Urinary urgency 01/17/2023   Murmur, cardiac 01/16/2023   UGI bleed 02/06/2013   Schizophrenia, chronic condition with acute exacerbation (HCC) 02/06/2013   Microcytic anemia 05/02/2012   GI bleed 05/02/2012   Acute renal failure (HCC) 05/02/2012    Schizoaffective disorder, bipolar type (HCC) 06/14/2011   Hepatitis C 06/13/2011   PCP:  Bess Kinds, MD Pharmacy:   [facility] [phone] - [place], [place] - [address] AT [facility] OF [address] [place] [address] Phone: [phone] Fax: [phone]  [id number] - [address] ([place]), [place] - [address] [place] [address] Phone: [phone] Fax: [phone]  [facility] [address] [place] [address] Phone: [phone] Fax: [phone]
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
## 2023-02-27 NOTE — Assessment & Plan Note (Signed)
A&O x4. Per sister, patient has likely been consistent with home PO Haldol 10 mg nightly.  Patient reports last taking this medication 4 days ago. - Restart PO Haldol 10 mg nightly.

## 2023-02-27 NOTE — ED Notes (Addendum)
 NA

## 2023-02-27 NOTE — ED Provider Triage Note (Signed)
Emergency Medicine Provider Triage Evaluation Note  [name] , a 83 y.o. male  was evaluated in triage.  Pt complains of loss of consciousness.  Was seen yesterday for the same complaint.  Says that he was sitting on a bench and blacked out before coming here.  Review of Systems  Positive: Loss of consciousness Negative: Chest pain, SOB  Physical Exam  BP (!) 135/114   Pulse 97   Temp 98.1 F (36.7 C)   Resp 15   SpO2 98%  Gen:   Awake, no distress   Resp:  Normal effort  MSK:   Moves extremities without difficulty  Other:    Medical Decision Making  Medically screening exam initiated at 10:43 AM.  Appropriate orders placed.  [name] was informed that the remainder of the evaluation will be completed by another provider, this initial triage assessment does not replace that evaluation, and the importance of remaining in the ED until their evaluation is complete.    Adrian Marion, PA-C 02/27/23 1046

## 2023-02-27 NOTE — Assessment & Plan Note (Addendum)
Cr: 1.3 ? 1.3 on admit 2/2.  Baseline ~ 1.1.  Patient has BPH baseline. Likely prerenal but possible obstructive component. No suprapubic fullness.  - Admit to Med Surg FMTS w/ attending Dr. Manson Passey.  - S/p IV 1L NaCl 0.9%. - Order bladder scan x1 overnight. - Restart home Flomax 0.4 mg nightly. - Repeat BMP AM 2/3. - Encourage p.o. intake.

## 2023-02-27 NOTE — ED Provider Notes (Signed)
[facility] EMERGENCY DEPARTMENT AT [facility] Provider Note  CSN: [id number] Arrival date & time: 02/27/23 1517  Chief Complaint(s) Loss of Consciousness  HPI [name] is a 83 y.o. male history of, hepatitis C presenting to the emergency department with "blacking out".  Is very difficult to get a history from patient.  Triage note reports the patient was complaining of "blackout spells".  The patient tells me he is here for "my vision, my hearing, my kidneys, my liver, my hepatitis C, I need a doctor who will manage me".  When I ask him directly about these "blacking out spells" the patient veers into another train of conversation.  He starts talking about how he has been arrested multiple times.  Apparently the first time he checked in this morning he said that he had absolutely no medical problems.  It is hard to even complete a review of systems as the patient is really not answering questions as they are asked.  History limited due to altered mental status   Past Medical History Past Medical History:  Diagnosis Date   AMS (altered mental status) 01/06/2023   Anemia 08/31/2012   Benign hypertrophy of prostate    Hepatitis C    Hypokalemia 02/06/2013   Schizophrenia (HCC)    Swelling of lower extremity 01/13/2023   Urinary tract infection    UTI (urinary tract infection) 06/13/2011   IMO SNOMED Dx Update Oct 2024     Patient Active Problem List   Diagnosis Date Noted   AKI (acute kidney injury) (HCC) 02/27/2023   Social problem 02/18/2023   Urinary urgency 01/17/2023   Murmur, cardiac 01/16/2023   UGI bleed 02/06/2013   Schizophrenia, chronic condition with acute exacerbation (HCC) 02/06/2013   Microcytic anemia 05/02/2012   GI bleed 05/02/2012   Acute renal failure (HCC) 05/02/2012   Schizoaffective disorder, bipolar type (HCC) 06/14/2011   Hepatitis C 06/13/2011   Home Medication(s) Prior to Admission medications   Medication Sig Start Date End Date  Taking? Authorizing Provider  haloperidol (HALDOL) 10 MG tablet Take 1 tablet (10 mg total) by mouth at bedtime. 02/18/23  Yes Sowell, Apolinar Junes, MD  hydrochlorothiazide (HYDRODIURIL) 12.5 MG tablet Take 1 tablet (12.5 mg total) by mouth daily. 01/19/23  Yes Shitarev, Dimitry, MD  tamsulosin (FLOMAX) 0.4 MG CAPS capsule Take 1 capsule (0.4 mg total) by mouth daily. 02/18/23  Yes Bess Kinds, MD                                                                                                                                    Past Surgical History Past Surgical History:  Procedure Laterality Date   KNEE SURGERY     right   Family History History reviewed. No pertinent family history.  Social History Social History   Tobacco Use   Smoking status: Former    Current packs/day: 0.00    Average packs/day: 1  pack/day for 20.0 years (20.0 ttl pk-yrs)    Types: Cigarettes    Start date: 06/11/1955    Quit date: 06/11/1975    Years since quitting: 47.7   Smokeless tobacco: Never  Vaping Use   Vaping status: Never Used  Substance Use Topics   Alcohol use: No   Drug use: No   Allergies Patient has no known allergies.  Review of Systems Review of Systems  Unable to perform ROS: Mental status change    Physical Exam Vital Signs  I have reviewed the triage vital signs BP (!) 151/75   Pulse (!) 58   Temp 98.5 F (36.9 C)   Resp 16   SpO2 100%  Physical Exam Vitals and nursing note reviewed.  Constitutional:      General: He is not in acute distress.    Appearance: Normal appearance.  HENT:     Mouth/Throat:     Mouth: Mucous membranes are moist.  Eyes:     Conjunctiva/sclera: Conjunctivae normal.  Cardiovascular:     Rate and Rhythm: Normal rate and regular rhythm.  Pulmonary:     Effort: Pulmonary effort is normal. No respiratory distress.     Breath sounds: Normal breath sounds.  Abdominal:     General: Abdomen is flat.     Palpations: Abdomen is soft.     Tenderness:  There is no abdominal tenderness.  Musculoskeletal:     Right lower leg: No edema.     Left lower leg: No edema.  Skin:    General: Skin is warm and dry.     Capillary Refill: Capillary refill takes less than 2 seconds.  Neurological:     Mental Status: He is alert.     Comments: Follows commands, answer some orientation questions, moves all 4 extremities equally.  Psychiatric:     Comments: Extremely disorganized and irrational thought process but specifically denies any hallucinations, suicidal or homicidal ideation     ED Results and Treatments Labs (all labs ordered are listed, but only abnormal results are displayed) Labs Reviewed  URINALYSIS, ROUTINE W REFLEX MICROSCOPIC - Abnormal; Notable for the following components:      Result Value   Color, Urine AMBER (*)    APPearance CLOUDY (*)    Ketones, ur 20 (*)    Protein, ur 100 (*)    Nitrite POSITIVE (*)    Leukocytes,Ua LARGE (*)    Bacteria, UA MANY (*)    All other components within normal limits  RAPID URINE DRUG SCREEN, HOSP PERFORMED - Abnormal; Notable for the following components:   Benzodiazepines POSITIVE (*)    All other components within normal limits  BASIC METABOLIC PANEL - Abnormal; Notable for the following components:   Potassium 5.6 (*)    Creatinine, Ser 1.32 (*)    GFR, Estimated 55 (*)    All other components within normal limits  BASIC METABOLIC PANEL - Abnormal; Notable for the following components:   Creatinine, Ser 1.33 (*)    GFR, Estimated 54 (*)    All other components within normal limits  URINE CULTURE  CBC  TSH  VITAMIN B12  RPR  CBG MONITORING, ED  TROPONIN I (HIGH SENSITIVITY)  TROPONIN I (HIGH SENSITIVITY)  Radiology No results found.  Pertinent labs & imaging results that were available during my care of the patient were reviewed by me and considered in  my medical decision making (see MDM for details).  Medications Ordered in ED Medications  sodium chloride 0.9 % bolus 1,000 mL (1,000 mLs Intravenous New Bag/Given 02/27/23 1948)  cefTRIAXone (ROCEPHIN) 1 g in sodium chloride 0.9 % 100 mL IVPB (1 g Intravenous New Bag/Given 02/27/23 1940)                                                                                                                                     Procedures Procedures  (including critical care time)  Medical Decision Making / ED Course   MDM:  83 year old male presenting to the emergency department with "blacking out spells.  Patient seems extremely disorganized.  It is very difficult to get much history from the patient.  Is unclear whether this is his baseline given his underlying schizophrenia or any acute process.  On review of chart he has had previous presentations .  The patient is not really able to tell me any information about these "blacking out spells.  Laboratory workup obtained notable for AKI.  Initial labs showed hyperkalemia, repeat lab without any intervention in the interim showed resolution, suspect this was due to underlying hemolysis.  Urinalysis obtained does show findings concerning for urinary infection.  Patient follows with the family medicine teaching service, given urinary infection possibly contributing to his altered mental status, discussed with them will admit the patient for further management.   Considered other causes such as intracranial process, patient actually had a CT scan yesterday which did not show any intracranial bleeding, tumor or other process.  I do not think he needs a repeat CT scan in such a short interval.  His behavior was the same yesterday.      Additional history obtained: - -External records from outside source obtained and reviewed including: Chart review including previous notes, labs, imaging, consultation notes including prior results    Lab Tests: -I  ordered, reviewed, and interpreted labs.   The pertinent results include:   Labs Reviewed  URINALYSIS, ROUTINE W REFLEX MICROSCOPIC - Abnormal; Notable for the following components:      Result Value   Color, Urine AMBER (*)    APPearance CLOUDY (*)    Ketones, ur 20 (*)    Protein, ur 100 (*)    Nitrite POSITIVE (*)    Leukocytes,Ua LARGE (*)    Bacteria, UA MANY (*)    All other components within normal limits  RAPID URINE DRUG SCREEN, HOSP PERFORMED - Abnormal; Notable for the following components:   Benzodiazepines POSITIVE (*)    All other components within normal limits  BASIC METABOLIC PANEL - Abnormal; Notable for the following components:   Potassium 5.6 (*)    Creatinine, Ser 1.32 (*)    GFR, Estimated 55 (*)  All other components within normal limits  BASIC METABOLIC PANEL - Abnormal; Notable for the following components:   Creatinine, Ser 1.33 (*)    GFR, Estimated 54 (*)    All other components within normal limits  URINE CULTURE  CBC  TSH  VITAMIN B12  RPR  CBG MONITORING, ED  TROPONIN I (HIGH SENSITIVITY)  TROPONIN I (HIGH SENSITIVITY)    Notable for UTI, AKI   Medicines ordered and prescription drug management: Meds ordered this encounter  Medications   sodium chloride 0.9 % bolus 1,000 mL   cefTRIAXone (ROCEPHIN) 1 g in sodium chloride 0.9 % 100 mL IVPB    Antibiotic Indication::   UTI    -I have reviewed the patients home medicines and have made adjustments as needed   Consultations Obtained: I requested consultation with the family medicine team ,  and discussed lab and imaging findings as well as pertinent plan - they recommend: admission   Cardiac Monitoring: The patient was maintained on a cardiac monitor.  I personally viewed and interpreted the cardiac monitored which showed an underlying rhythm of: NSR  Social Determinants of Health:  Diagnosis or treatment significantly limited by social determinants of health:  homelessness   Reevaluation: After the interventions noted above, I reevaluated the patient and found that their symptoms have improved  Co morbidities that complicate the patient evaluation  Past Medical History:  Diagnosis Date   AMS (altered mental status) 01/06/2023   Anemia 08/31/2012   Benign hypertrophy of prostate    Hepatitis C    Hypokalemia 02/06/2013   Schizophrenia (HCC)    Swelling of lower extremity 01/13/2023   Urinary tract infection    UTI (urinary tract infection) 06/13/2011   IMO SNOMED Dx Update Oct 2024        Dispostion: Disposition decision including need for hospitalization was considered, and patient admitted to the hospital.    Final Clinical Impression(s) / ED Diagnoses Final diagnoses:  AKI (acute kidney injury) (HCC)     This chart was dictated using voice recognition software.  Despite best efforts to proofread,  errors can occur which can change the documentation meaning.    Lonell Grandchild, MD 02/27/23 [id number]

## 2023-02-27 NOTE — Discharge Instructions (Addendum)
Dear Baldwin Crown,  Thank you for letting us participate in your care. You were hospitalized for acute kidney injury and urinary tract infection. You were treated for this with fluids and antibiotics.   We also treated you with the assistance of psychiatry to help control your mood and behaviors. They recommended you taking Haldol twice daily.  You had some chest pain and tachycardia during your hospitalization. We started you on a medication called Diltiazem to help control your heart rate.   POST-HOSPITAL & CARE INSTRUCTIONS Please follow up with your PCP and psychiatry and follow their recommendations. Go to your follow up appointments (listed below)   DOCTOR'S APPOINTMENT   No future appointments.  Take care and be well!  Family Medicine Teaching Service Inpatient Team [place]  [facility]  [address] [place], [place] [address] [phone]        DAY CENTERS Interactive Resource Center [facility]) Monday - Friday 8am - 3pm          Sat & Sun 8am - 2pm [address] [place], [place] [address]   [phone]     [email or web address] IRC offers among other critical resources: showers, laundry, barbershop, phone bank, mailroom, computer lab, medical clinic, gardens and a bike maintenance area.   AREA SHELTERS  [facility] [employer]  (Men & women) [address] [place] [phone]  [facility] (Men/women/families) [address] [place] [phone]   [facility] (Families with children) [phone] [address]  [place] [phone]   [facility] (Domestic Violence Shelter) [address]  [place] [phone]   [facility] (Children ages 67-17) [address]  [place] [phone]   [facility]   (Women & children) [address] [place] [phone]   [facility] (Women/substance abuse) [address]  [place] [phone]    [facility] (Men) [address]  [place] [phone]   [facility] (Men) [address]  [place] [phone]  [employer] (Women) [phone] [address]  [place] [phone]   [facility] (Single women & women with children) [address]  [place] Allegra [phone]  [facility] (Men/women/families) [address] [phone]    [facility]   (Domestic Violence shelter) [address]  [place] [phone]   [facility] (Men & women) [address]  [place] [phone]  Angelina Pih Min (Men) [address]  [employer] [phone]   [facility] (Men) [address]  [place] [phone]   Holiday representative (Single women & families) [address]  [place] [phone]  Crisis [facility]. (Men/women & families) [address]  [place] [phone]    If you are at risk of losing your housing (throughout [facility]) call the Housing Hotline at [phone]. You may also contact 2-1-1, a FREE service of the [place] that provides information about many resources including housing. Dial 211, or visit online at PooledIncome.pl. [facility]:  House of [place], contact Janelle Floor [phone] (men only)                          Huey Romans in [place]:  90 day homeless program for women and men;                             contact  Cleatrice Burke [phone]  [facility]:  [facility]:  men/women/children [phone]  [facility]:  Shelter in [place], [place] [phone]                       [facility] in [place], [place] [phone]   [facility]:  Crisis Council for Abused Women, [phone]; (women and children)  [facility]:  [employer], men/women/children; [phone]                           [facility] in [place], [phone]; substance abuse halfway  house for men              [facility]; 4 bedroom house in [facility] for homeless women, contact Jenne Campus [phone]               [facility] in [place], [phone] (women and children)               Friend to Friend, for abused women and children, 24 hour crisis line, [phone], Jamison Oka  [facility], halfway house for women, [place], [phone]  [facility]:  [facility], [phone]; open Mon-Thurs from YBO17 - March 15 when temp is below 32 degrees                              [facility]; Alwyn Pea [place], [phone]; cell [phone]; open 24/7  [facility]:  Outreach for [place] - Rev Ladona Ridgel [phone]  [place]:  transitional housing for women and children; Arminda Resides [phone]   PCP- Primary care [facility] [phone] they have a pharmacy and financial counsellors social workers   [place]  [facility] [employer] Address: [id number] [place], [place] [address] Phone Number: [phone] Hours of Operation: Residents of [place] can come to obtain food Monday through Friday from 8:30am until 3:30pm. Photo ID and Social Security cards required for all residents of a household. Can come six times a year  [facility] Address: [address] [place], [place] [address] Phone Number: [phone] Hours of Operation: Operates Tuesday-Friday 10:00 a.m. to 1 p.m. Requirements: Referral from DSS needed. May come 6 times a year, 30 days apart. Photo ID and SS required for all residents of household.  [facility] Address: [address] [place], [place] [address] Phone Number: [phone] Hours of Operation: Food pantry is open on the last Saturday of each month from 10:00 am - 12:00 noon. No appointment needed. No qualifications.  [facility] Address: [address] [place], [place]  [address] Phone Number: [phone]. [phone] Hours of Operation: Must make reservations to pick up food on Saturdays. Sign ups for Saturday pick up beginning at 8:30 a.m. on Monday morning.  [facility]ick[facility] Address: [address] [place], [place] [address] Phone Number: [phone] Hours of Operation: If you need food, bring proper identification such as a driver's license to receive a bag of food once a month. Requirements: Can come once every 30 days with referral DSS, Holiday representative, Mental health etc. Each referral good for six visits. Photo ID required. *1st visit no referral required.  [facility] Address: [address] [place], [place] [address] Phone Number: [phone]  [place] [facility] Address: [address] [place], [place] [address] Phone Number: [phone]  Hours of Operation:  You can register at [email or web address] for free groceries  [facility] Address: [address] [place], [place] [address] Phone Number: [phone] Hours of Operation: Drive through giveaway, first come first served. Every 3rd Saturday 11AM - 1PM  [facility] [address] Address: [address] [place] [address] Phone Number: [phone]   [place]  [facility] HAND FOOD PANTRY Address: [address] [place], [place] [address] Phone Number: [phone] Hours of Operation: Once a month every 3rd Saturday  [facility] Address: [address] [place], [place] [address] Phone Number: [phone] Hours of Operation: Distribution happens from 9:00-10:00 a.m. every Saturday.     [facility] Address: [address] [place], [place] [address] Phone Number: [phone] Hours of Operation: ONCE a week for the community food distribution held every Tuesday, Wednesday and Thursday from 11 a.m. - 2:00 p.m. Food is available on a first come, first serve basis and varies week to week. No appointment necessary  for drive thru pick up.  [facility] Address: [address] [place], [place] [address] Phone Number: [phone] Hours of Operation: Open every 3rd Thursday 9:30 a.m. - 11:00 a.m.  [facility] Address: [address] [place], [place] [address] Phone Number: [phone] Hours of Operation: Please call for hours, directions, and questions  [facility] [place] [facility] Address: [address] [place], [place]  [address] Phone Number: [phone] Website: https://www.Hollyguns.co.za Food Finder app: https://findfood.ghpfa.org  [employer] Address: [address] [place], [place] [address] Phone Number: [phone] Hours of Operation: Contact Bree Harpe. Enrolled Substance Abuse Clients Only  [facility] Address: [address] [place], [address]  Phone Number: [phone] Hours of Operation: Contact Clyde Lundborg. Food pantry open the 3rd Saturday of each month from 9 a.m. -12 p.m. only  [place] [facility] Address: [address] [place], [place] [address] Phone Number: [phone] Hours of Operation: Contact Juanda Crumble. Emergency food bank open on Saturdays by appointment only  [facility] Address: [address] [place], [place] [address] Phone Number: [phone] Hours of Operation: No specific contact person; Anyone can help  [facility] Address: [address] [place], [place] [address] Phone Number: [phone] Hours of Operation: Contact Carney Bern. Agency gives out a bag of food every Thursday from 2-4 p.m. only, and also provides a community meal every Thursday between 5-6 p.m. Other services provided include rent/mortgage and utility assistance, women's winter shelter, thrift store, and senior adult activities.  [facility] [place] Address: [address] [place], [place] [address] Phone Number: [phone] Hours of Operation: The  Emergency Food Assistance Program provides individuals and families with a generous supply of food including meat, fresh vegetables, and nonperishable items. The food box contains five days' worth of food, and each family or individual can receive a box once per month. M, W, Th, Fr 11am-2pm, walk-ins welcome.  [facility] Address: [address] [place], [place] [address]  Phone Number: [phone] Hours of Operation: Contact Asia Scarlette [place]. Tuesdays and Thursdays from 11am - 3pm by appointment only   [employer] Address: [address] [place], [place] [address]/[address], [place], [place] [address] Phone Number: [phone] [phone] Hours of operation: 8:00AM-5:00PM Online Application: [email or web address]

## 2023-02-27 NOTE — Hospital Course (Addendum)
[name] is a 83 y.o.male with a history of past medical history of schizoaffective disorder, bipolar type noncompliant on home meds, HTN, hepatitis C, hearing loss, BPH and housing insecurity presenting with dizzy spells, gait ataxia, and disorganized thinking [facility] at [facility]. His hospital course is detailed below:  UTI Increased urgency, UA positive for leukocytes and nitrites and he was given a dose of ceftriaxone in the ED.  Urine culture positive for E Coli and he transitioned to Cefadroxil 500 mg BID (2/2-2/7) and completed the course. Repeat UA was dirty with leukocytes and bacteria. Patient was not prescribed any further antibiotics after completing course prior to discharge.   Schizoaffective Disorder, Bipolar Type Presented to the ED tangential and disorganized, likely secondary to medication non-compliance in the setting of running out of meds.  He was restarted back on Haldol 10 mg at bedtime, with agitation meds ordered as needed.  Psychiatry was consulted and adjusted medications throughout admission.  Patient became intermittently agitated during admission, requiring IM Haldol and IV Ativan.  Patient was given Haldol 10 mg BID. Patient was amenable with Haldol decanoate prior to discharge. [facility] to [facility] for further psychiatric treatment.   Murmur - Mild aortic stenosis Systolic murmur noted in RUSB. Echo demonstrated EF 60-65%, moderate LVH, mild MVR, and moderate calcification of the aortic valve and mild AVR and stenosis.  He has significant scrotal edema and some LE edema while sitting.  Given Lasix 20 mg prn to help with fluid overload.  His electrolytes were monitored with BMP and electrolytes were replete as needed. Repeat echo in 2-3 years to monitor valvular disease.  Chest Pain/SVT Pt complained of intermittent chest pain on 2/10 and 2/11.  Trops were negative and peaked at 8. EKG was suspicious for SVT with HRs into the 170s.   He was given IV Diltiazem 10 mg x1 and vitals stabilized. Patient was discharged on Diltiazem 180 mg daily.   AKI Cr elevated to 1.32 on arrival improved after 1L NS bolus on admission to 1.01.  PO hydration was encouraged.  The patient's Cr was monitored and was back at his baseline prior to discharge.   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  US Scrotum for 07/15/2014 noted bilateral hydrocele L>R.  Denies pain.  2/5 US Scrotum consistent w/ complex L hydrocele.  PCP Follow-up Recommendations: Repeat Echo in 2-3 years d/t aortic valve stenosis. Consider Urology Referral for scrotal edema  Follow-up compliance with psychiatric medications after discharge.

## 2023-02-28 ENCOUNTER — Observation Stay (HOSPITAL_BASED_OUTPATIENT_CLINIC_OR_DEPARTMENT_OTHER): Payer: No Typology Code available for payment source

## 2023-02-28 ENCOUNTER — Other Ambulatory Visit: Payer: Self-pay

## 2023-02-28 DIAGNOSIS — R270 Ataxia, unspecified: Secondary | ICD-10-CM

## 2023-02-28 DIAGNOSIS — E43 Unspecified severe protein-calorie malnutrition: Secondary | ICD-10-CM | POA: Insufficient documentation

## 2023-02-28 DIAGNOSIS — I340 Nonrheumatic mitral (valve) insufficiency: Secondary | ICD-10-CM | POA: Diagnosis not present

## 2023-02-28 DIAGNOSIS — N179 Acute kidney failure, unspecified: Principal | ICD-10-CM

## 2023-02-28 DIAGNOSIS — I350 Nonrheumatic aortic (valve) stenosis: Secondary | ICD-10-CM

## 2023-02-28 DIAGNOSIS — N3000 Acute cystitis without hematuria: Secondary | ICD-10-CM

## 2023-02-28 HISTORY — DX: Acute cystitis without hematuria: N30.00

## 2023-02-28 LAB — BASIC METABOLIC PANEL
Anion gap: 9 (ref 5–15)
BUN: 16 mg/dL (ref 8–23)
CO2: 20 mmol/L — ABNORMAL LOW (ref 22–32)
Calcium: 8.6 mg/dL — ABNORMAL LOW (ref 8.9–10.3)
Chloride: 108 mmol/L (ref 98–111)
Creatinine, Ser: 1.01 mg/dL (ref 0.61–1.24)
GFR, Estimated: >60 mL/min (ref >60)
Glucose, Bld: 86 mg/dL (ref 70–99)
Potassium: 4.4 mmol/L (ref 3.5–5.1)
Sodium: 137 mmol/L (ref 135–145)

## 2023-02-28 LAB — ECHOCARDIOGRAM COMPLETE
AR max vel: 1.79 cm2
AV Area VTI: 1.72 cm2
AV Area mean vel: 1.81 cm2
AV Mean grad: 13 mm[Hg]
AV Peak grad: 21.7 mm[Hg]
Ao pk vel: 2.33 m/s
Area-P 1/2: 1.94 cm2
Calc EF: 67.3 %
Height: 68 in
MV M vel: 5.78 m/s
MV Peak grad: 133.6 mm[Hg]
MV VTI: 2.28 cm2
P 1/2 time: 575 ms
Radius: 0.5 cm
S' Lateral: 2.2 cm
Single Plane A2C EF: 65.9 %
Single Plane A4C EF: 67.3 %
Weight: 2821.89 [oz_av]

## 2023-02-28 LAB — RPR: RPR Ser Ql: NONREACTIVE

## 2023-02-28 LAB — VITAMIN B12: Vitamin B-12: 1942 pg/mL — ABNORMAL HIGH (ref 180–914)

## 2023-02-28 MED ORDER — ENSURE ENLIVE PO LIQD
237.0000 mL | Freq: Two times a day (BID) | ORAL | Status: DC
  Administered 2023-02-28 – 2023-03-10 (×15): 237 mL via ORAL

## 2023-02-28 MED ORDER — THIAMINE MONONITRATE 100 MG PO TABS
100.0000 mg | ORAL_TABLET | Freq: Every day | ORAL | Status: DC
Start: 2023-02-28 — End: 2023-03-10
  Administered 2023-02-28 – 2023-03-10 (×8): 100 mg via ORAL
  Filled 2023-02-28 (×10): qty 1

## 2023-02-28 MED ORDER — ADULT MULTIVITAMIN W/MINERALS CH
1.0000 | ORAL_TABLET | Freq: Every day | ORAL | Status: DC
  Administered 2023-02-28 – 2023-03-10 (×8): 1 via ORAL
  Filled 2023-02-28 (×10): qty 1

## 2023-02-28 MED ORDER — HALOPERIDOL 1 MG PO TABS
5.0000 mg | ORAL_TABLET | Freq: Three times a day (TID) | ORAL | Status: DC | PRN
  Filled 2023-02-28: qty 5

## 2023-02-28 NOTE — Progress Notes (Signed)
Initial Nutrition Assessment  DOCUMENTATION CODES:   Severe malnutrition in context of social or environmental circumstances  INTERVENTION:  Continue with current diet. Add assistance with meals to ensure patient is receiving foods he will eat.  Continue with Ensure Plus High Protein po BID, each supplement provides 350 kcal and 20 grams of protein. Implement Multivitamin/minerals   NUTRITION DIAGNOSIS:   Severe Malnutrition related to social / environmental circumstances as evidenced by moderate fat depletion, severe muscle depletion.    GOAL:   Patient will meet greater than or equal to 90% of their needs    MONITOR:   PO intake, Supplement acceptance  REASON FOR ASSESSMENT:   Consult Assessment of nutrition requirement/status  ASSESSMENT:  83 y.o. M, Presented to ED with complaints of dizzy spells, gait ataxia, disorganized thinking.  Admitted with AKI. PMH: schizophrenia, Hep C, UTI, Hypokalemia, AMS. Noted housing insecurities, noted to be very religious.  Will limit or exclude foods due to religious beliefs.   Patient sleeping and non responsive to RD touch or voice. Unable to obtain good nutritional history.  Review of weights patient weight trending stable. Meal tray in room with 75-100% of both trays consumed.   Admit weight: 80kg Weight history:  02/28/23 80 kg  02/18/23 79.9 kg  02/08/23 76 kg  01/06/23 76 kg  02/26/22 75 kg    Average Meal Intake: No current data  Nutritionally Relevant Medications: Scheduled Meds:  feeding supplement  237 mL Oral BID BM   haloperidol  10 mg Oral QHS   thiamine  100 mg Oral Daily    Labs Reviewed    NUTRITION - FOCUSED PHYSICAL EXAM:  Flowsheet Row Most Recent Value  Orbital Region Moderate depletion  Upper Arm Region Moderate depletion  Thoracic and Lumbar Region Moderate depletion  Buccal Region Moderate depletion  Temple Region Severe depletion  Clavicle Bone Region Severe depletion  Clavicle and  Acromion Bone Region Severe depletion  Scapular Bone Region Unable to assess  Anterior Thigh Region No depletion  Posterior Calf Region No depletion  Edema (RD Assessment) None  Hair Reviewed  Eyes Unable to assess  Mouth Reviewed  Skin Reviewed  Nails Reviewed       Diet Order:   Diet Order             Diet regular Room service appropriate? Yes; Fluid consistency: Thin  Diet effective now                   EDUCATION NEEDS:   Not appropriate for education at this time  Skin:  Skin Assessment: Reviewed RN Assessment  Last BM:  PTA  Height:   Ht Readings from Last 1 Encounters:  02/28/23 5\' 8"  (1.727 m)    Weight:   Wt Readings from Last 1 Encounters:  02/28/23 80 kg    Ideal Body Weight:     BMI:  Body mass index is 26.82 kg/m.  Estimated Nutritional Needs:   Kcal:  2100-2450 kcal  Protein:  95-120 g  Fluid:  27ml/kcal    Jamelle Haring RDN, LDN Clinical Dietitian   If unable to reach, please contact "RD Inpatient" secure chat group between 8 am-4 pm daily"

## 2023-02-28 NOTE — Assessment & Plan Note (Addendum)
UA positive for leukocytes and nitrites. S/p CTX in ED. Endorses increased urinary frequency.  - Cefadroxil 500 mg BID (2/2-2/7) - Follow up urine culture

## 2023-02-28 NOTE — Telephone Encounter (Signed)
 Reviewed and agree with Dr Macky Lower plan.

## 2023-02-28 NOTE — Evaluation (Signed)
Physical Therapy Evaluation Patient Details Name: [name] MRN: [id number] DOB: [birth year] Today's Date: 02/28/2023  History of Present Illness  Pt is an 83 y/o M presenting to ED on 2/2 with "blackout spells" and urinary urgency, admitted for AKI. PMH includes schizoaffective disorder, homelessness, AMS, bipolar, HTN, hep C, BPH   Clinical Impression  Admitted with above diagnosis. During evaluation, patient was tangential and an unclear historian;at times living with his sister, and at times in shelters/homeless camps; it also sounds like prior to admission he was able to walk, and did not use an assistive device; presents to PT with gait unsteadiness, stiff and slighltly forward posture with gait, and decrease step height; is at a higher risk of fall as the possibility of tripping, and toe catching is increased with a shuffling gait pattern; able to stand from the bed with min studying assist, and walk in the hallways with min handheld assist; per chart review, it sounds like it is possible for him to DC home with his sister; worth considering home health PT follow up; will follow acutely        If plan is discharge home, recommend the following: Assistance with cooking/housework;Supervision due to cognitive status   Can travel by private vehicle        Equipment Recommendations None recommended by PT  Recommendations for Other Services       Functional Status Assessment Patient has had a recent decline in their functional status and/or demonstrates limited ability to make significant improvements in function in a reasonable and predictable amount of time     Precautions / Restrictions Precautions Precautions: Fall Restrictions Weight Bearing Restrictions Per Provider Order: No      Mobility  Bed Mobility Overal bed mobility: Independent                  Transfers Overall transfer level: Needs assistance Equipment used: 1 person hand held assist Transfers: Sit  to/from Stand Sit to Stand: Contact guard assist                Ambulation/Gait Ambulation/Gait assistance: Min assist Gait Distance (Feet): 75 Feet Assistive device: 1 person hand held assist Gait Pattern/deviations: Step-through pattern, Decreased step length - right, Decreased step length - left       General Gait Details: short steps, inefficient gait, but no overt loss of balance  Stairs            Wheelchair Mobility     Tilt Bed    Modified Rankin (Stroke Patients Only)       Balance Overall balance assessment: Needs assistance Sitting-balance support: Feet supported Sitting balance-Leahy Scale: Good     Standing balance support: During functional activity, Reliant on assistive device for balance Standing balance-Leahy Scale: Fair                               Pertinent Vitals/Pain      Home Living Family/patient expects to be discharged to:: Shelter/Homeless                   Additional Comments: per chart review was living with sister, pt unable to clarify    Prior Function               Mobility Comments: reports he had a cane and crutches, did not specify if he used them ADLs Comments: unsure     Extremity/Trunk Assessment   Upper  Extremity Assessment Upper Extremity Assessment: Defer to OT evaluation    Lower Extremity Assessment Lower Extremity Assessment: Overall WFL for tasks assessed    Cervical / Trunk Assessment Cervical / Trunk Assessment: Normal  Communication   Communication Communication: Hearing impairment  Cognition Arousal: Alert Behavior During Therapy: WFL for tasks assessed/performed Overall Cognitive Status: Difficult to assess                                 General Comments: pt HOH, A & O x4, answers some PLOF questions but is tangential and continues to talk about living in [place] and being incarcerated        General Comments General comments (skin integrity,  edema, etc.): NAD on room air    Exercises     Assessment/Plan    PT Assessment Patient needs continued PT services  PT Problem List Decreased strength;Decreased activity tolerance;Decreased balance       PT Treatment Interventions DME instruction;Gait training;Stair training;Functional mobility training;Therapeutic activities;Therapeutic exercise;Balance training;Patient/family education    PT Goals (Current goals can be found in the Care Plan section)  Acute Rehab PT Goals Patient Stated Goal: Expresses a need to get t the courthouse PT Goal Formulation: Patient unable to participate in goal setting Time For Goal Achievement: 03/14/23 Potential to Achieve Goals: Good    Frequency Min 1X/week     Co-evaluation               AM-PAC PT "6 Clicks" Mobility  Outcome Measure Help needed turning from your back to your side while in a flat bed without using bedrails?: None Help needed moving from lying on your back to sitting on the side of a flat bed without using bedrails?: None Help needed moving to and from a bed to a chair (including a wheelchair)?: None Help needed standing up from a chair using your arms (e.g., wheelchair or bedside chair)?: A Little Help needed to walk in hospital room?: A Little Help needed climbing 3-5 steps with a railing? : A Little 6 Click Score: 21    End of Session Equipment Utilized During Treatment: Gait belt Activity Tolerance: Patient tolerated treatment well Patient left: in bed;with call bell/phone within reach;with bed alarm set Nurse Communication: Mobility status PT Visit Diagnosis: Unsteadiness on feet (R26.81)    Time: 1610-9604 PT Time Calculation (min) (ACUTE ONLY): 8 min   Charges:   PT Evaluation $PT Eval Low Complexity: 1 Low   PT General Charges $$ ACUTE PT VISIT: 1 Visit         Van Clines, PT  Acute Rehabilitation Services Office [phone] Secure Chat welcomed   Levi Aland 02/28/2023, 2:25 PM

## 2023-02-28 NOTE — Evaluation (Signed)
Occupational Therapy Evaluation Patient Details Name: [name] MRN: [id number] DOB: 1940/10/15 Today's Date: 02/28/2023   History of Present Illness Pt is an 83 y/o M presenting to ED on 2/2 with "blackout spells" and urinary urgency, admitted for AKI. PMH includes schizoaffective disorder, homelessness, AMS, bipolar, HTN, hep C, BPH   Clinical Impression   Pt questionable historian, per chart review was living with his sister, pt states he "has a cane and crutches" but unsure if he was using them. Pt A & Ox4, but needs frequent redirection to ADL/mobility tasks during session as pt tangential regarding prior events. Pt needing up to min A for ADLs, ind with bed mobility and CGA for transfers/hallway ambulation with 1 person HHA. Pt presenting with impairments listed below, will follow acutely. Recommend HHOT at d/c pending progression.      If plan is discharge home, recommend the following: A little help with walking and/or transfers;A little help with bathing/dressing/bathroom;Direct supervision/assist for medications management;Assist for transportation;Direct supervision/assist for financial management;Help with stairs or ramp for entrance;Assistance with cooking/housework    Functional Status Assessment  Patient has had a recent decline in their functional status and demonstrates the ability to make significant improvements in function in a reasonable and predictable amount of time.  Equipment Recommendations  None recommended by OT    Recommendations for Other Services PT consult     Precautions / Restrictions Precautions Precautions: Fall Restrictions Weight Bearing Restrictions Per Provider Order: No      Mobility Bed Mobility Overal bed mobility: Independent                  Transfers Overall transfer level: Needs assistance Equipment used: 1 person hand held assist Transfers: Sit to/from Stand Sit to Stand: Contact guard assist                   Balance Overall balance assessment: Needs assistance Sitting-balance support: Feet supported Sitting balance-Leahy Scale: Good     Standing balance support: During functional activity, Reliant on assistive device for balance Standing balance-Leahy Scale: Fair                             ADL either performed or assessed with clinical judgement   ADL Overall ADL's : Needs assistance/impaired Eating/Feeding: Set up;Sitting   Grooming: Set up;Standing   Upper Body Bathing: Minimal assistance;Standing   Lower Body Bathing: Minimal assistance;Sit to/from stand   Upper Body Dressing : Minimal assistance;Standing   Lower Body Dressing: Minimal assistance;Sitting/lateral leans   Toilet Transfer: Contact guard assist;Ambulation;Regular Toilet   Toileting- Clothing Manipulation and Hygiene: Contact guard assist       Functional mobility during ADLs: Contact guard assist       Vision   Additional Comments: 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" pt able to read text on TV screen and functionally navigate environment     Perception Perception: Not tested       Praxis Praxis: Not tested       Pertinent Vitals/Pain Pain Assessment Pain Assessment: No/denies pain     Extremity/Trunk Assessment Upper Extremity Assessment Upper Extremity Assessment: Overall WFL for tasks assessed   Lower Extremity Assessment Lower Extremity Assessment: Defer to PT evaluation   Cervical / Trunk Assessment Cervical / Trunk Assessment: Normal   Communication Communication Communication: Hearing impairment   Cognition Arousal: Alert Behavior During Therapy: WFL for tasks assessed/performed Overall Cognitive Status: Difficult to assess  General Comments: pt HOH, A & O x4, answers some PLOF questions but is tangential and continues to talk about living in [place] and being incarcerated     General Comments  VSS on RA    Exercises      Shoulder Instructions      Home Living Family/patient expects to be discharged to:: Shelter/Homeless                                 Additional Comments: per chart review was living with sister, pt unable to clarify      Prior Functioning/Environment               Mobility Comments: reports he had a cane and crutches, did not specify if he used them ADLs Comments: unsure        OT Problem List: Decreased range of motion;Decreased strength;Decreased activity tolerance;Impaired balance (sitting and/or standing);Decreased cognition;Decreased safety awareness;Impaired vision/perception      OT Treatment/Interventions: Self-care/ADL training;Therapeutic exercise;Energy conservation;DME and/or AE instruction;Therapeutic activities;Patient/family education;Balance training    OT Goals(Current goals can be found in the care plan section) Acute Rehab OT Goals Patient Stated Goal: none stated OT Goal Formulation: With patient Time For Goal Achievement: 03/14/23 Potential to Achieve Goals: Good ADL Goals Pt Will Perform Upper Body Dressing: Independently;sitting Pt Will Perform Lower Body Dressing: Independently;sitting/lateral leans;sit to/from stand Pt Will Transfer to Toilet: Independently;ambulating;regular height toilet Pt Will Perform Tub/Shower Transfer: Tub transfer;Shower transfer;Independently;ambulating Additional ADL Goal #1: pt will follow 3 step command with min cues in prep for ADLs  OT Frequency: Min 1X/week    Co-evaluation              AM-PAC OT "6 Clicks" Daily Activity     Outcome Measure Help from another person eating meals?: None Help from another person taking care of personal grooming?: A Little Help from another person toileting, which includes using toliet, bedpan, or urinal?: A Little Help from another person bathing (including washing, rinsing, drying)?: A Little Help from another person to put on and taking off regular upper  body clothing?: A Little Help from another person to put on and taking off regular lower body clothing?: A Little 6 Click Score: 19   End of Session Equipment Utilized During Treatment: Gait belt Nurse Communication: Mobility status  Activity Tolerance: Patient tolerated treatment well Patient left: in bed;with call bell/phone within reach;with bed alarm set  OT Visit Diagnosis: Unsteadiness on feet (R26.81);Other abnormalities of gait and mobility (R26.89);Muscle weakness (generalized) (M62.81)                Time: 1610-9604 OT Time Calculation (min): 21 min Charges:  OT General Charges $OT Visit: 1 Visit OT Evaluation $OT Eval Low Complexity: 1 Low  [name] K, OTD, OTR/L SecureChat Preferred Acute Rehab [phone]   Montrez Marietta K Koonce 02/28/2023, 12:00 PM

## 2023-02-28 NOTE — Plan of Care (Signed)
   Problem: Education: Goal: Knowledge of General Education information will improve Description: Including pain rating scale, medication(s)/side effects and non-pharmacologic comfort measures Outcome: Not Progressing   Problem: Health Behavior/Discharge Planning: Goal: Ability to manage health-related needs will improve Outcome: Not Progressing

## 2023-02-28 NOTE — Assessment & Plan Note (Addendum)
Systolic murmur noted in RUSB. Will assess further with an Echo. - Echo

## 2023-02-28 NOTE — Assessment & Plan Note (Addendum)
Cr elevated to 1.32 on arrival and has improved to 1.01 w/ 1L NS bolus. - Continue to monitor with BMP - Encourage PO

## 2023-02-28 NOTE — Progress Notes (Addendum)
   Daily Progress Note Intern Pager: [phone]  Patient name: [name] Medical record number: [id number] Date of birth: August 06, 1940 Age: 83 y.o. Gender: male  Primary Care Provider: No primary care provider on file. Consultants: None Code Status: Full Code  Pt Overview and Major Events to Date:  2/2: Admitted  Assessment and Plan: [name] is a 83 y.o. male with a past medical history of schizoaffective disorder, bipolar type treated with Haldol 10 at bedtime, HTN, hepatitis C, hearing loss, BPH and housing insecurity presenting with dizzy spells, gait ataxia, and disorganized thinking and admitted for AKI and UTI.  Assessment & Plan AKI (acute kidney injury) (HCC) Cr elevated to 1.32 on arrival and has improved to 1.01 w/ 1L NS bolus. - Continue to monitor with BMP - Encourage PO Acute cystitis without hematuria UA positive for leukocytes and nitrites. S/p CTX in ED. Endorses increased urinary frequency.  - Cefadroxil 500 mg BID (2/2-2/7) - Follow up urine culture  Schizoaffective disorder, bipolar type (HCC) AAOx4 but otherwise has tangential speech and describes be psychoanalyzed. Quite disorganized this AM.  - Psych consulted, appreciate recommendations - Haldol 5 mg q8 PRN for agitation Ataxia Upon admission patient had difficulty rising from a seated position. CT head w/o contrast negative. B12 elevated 1,942.  - Thiamine level - Thiamine 100 mg daily - If changes/worsens will consider MRI wo contrast Murmur Systolic murmur noted in RUSB. Will assess further with an Echo. - Echo  Chronic and Stable Problems:  BPH: Restart home Flomax 0.4 mg nightly HTN: Hypertensive to 151/75, has ranged from SBPs 120-130 over the last 24 hours.  Holding home HCTZ in setting of AKI and dizzy spells. Hyperkalemia: 5.6, improved to 4.4. . EKG abnormality: Atrial flutter vs NSR with significant PACs.  Repeat EKG a.m. 2/3.  FEN/GI: Regular diet PPx: Lovenox Dispo: Home to  sister  Subjective:  Patient was tangential this morning and discussing being psychoanalyzed. He has a difficult time hearing and is unable to answer all my questions/follow my commands.    Objective: Temp:  [97.5 F (36.4 C)-98.5 F (36.9 C)] 97.8 F (36.6 C) (02/03 1247) Pulse Rate:  [52-78] 78 (02/03 1247) Resp:  [15-18] 18 (02/03 1247) BP: (122-157)/(74-99) 125/93 (02/03 1247) SpO2:  [96 %-100 %] 96 % (02/03 1247) Weight:  [80 kg] 80 kg (02/03 0127) Physical Exam: General: AAOx4 in NAD Cardiovascular: RRR. 2/6 systolic murmur in RUSB. Respiratory: CTAB. Normal WOB on RA.  Abdomen: Soft, non-tender, non-distended. Normoactive bowel sounds. Extremities: No BLE edema. Neuro: Unable to perform d/t cooperation  Laboratory: Most recent CBC Lab Results  Component Value Date   WBC 5.6 02/27/2023   HGB 14.9 02/27/2023   HCT 46.0 02/27/2023   MCV 89.5 02/27/2023   PLT 237 02/27/2023   Most recent BMP    Latest Ref Rng & Units 02/28/2023    1:58 AM  BMP  Glucose 70 - 99 mg/dL 86   BUN 8 - 23 mg/dL 16   Creatinine 2.13 - 1.24 mg/dL 0.86   Sodium 578 - 469 mmol/L 137   Potassium 3.5 - 5.1 mmol/L 4.4   Chloride 98 - 111 mmol/L 108   CO2 22 - 32 mmol/L 20   Calcium 8.9 - 10.3 mg/dL 8.6    Imaging/Diagnostic Tests: No new imaging.  Adrian Curling, DO 02/28/2023, 2:29 PM  PGY-1, [facility] FPTS Intern pager: [phone], text pages welcome Secure chat group [facility] Teaching Service

## 2023-02-28 NOTE — TOC CM/SW Note (Signed)
[facility] notified  [phone]

## 2023-02-28 NOTE — Consult Note (Signed)
Brief Psychiatry Consult Note  Briefly, consulted for worsening psychosis in this pt with questionable compliance with haldol and UTI. Fairly well known to c/s service - saw several times in December w/ similar presentation, ultimately dc after receiving LAI. Unclear if he got 2nd dose or has had LAI since 12/24 (does not appear to have gotten repeat dose 12/31).   - agree w/ primary - restarting oral haldol probably best option at this time. Also important to treat UTI.  - as c/s noted OK to see pt tomorrow, will see pt tomorrow - will have another day of abx and haldol at that point.   Please reach out with questions.   [name] A [name]

## 2023-02-28 NOTE — TOC Progression Note (Signed)
Transition of Care [facility]) - Progression Note    Patient Details  Name: NICKOLAOS BRALLIER MRN: [id number] Date of Birth: 07/28/1940  Transition of Care [facility]) CM/SW Contact  Marliss Coots, LCSW Phone Number: 02/28/2023, 1:21 PM  Clinical Narrative:     1:21 PM Added food assistance resources to AVS.    Barriers to Discharge: Continued Medical Work up, Homeless with medical needs  Expected Discharge Plan and Services       Living arrangements for the past 2 months: Single Family Home, Homeless Shelter                                       Social Determinants of Health (SDOH) Interventions SDOH Screenings   Food Insecurity: Food Insecurity Present (02/28/2023)  Housing: High Risk (02/28/2023)  Transportation Needs: Patient Unable To Answer (02/28/2023)  Utilities: Not At Risk (02/28/2023)  Social Connections: Patient Declined (02/28/2023)  Tobacco Use: Medium Risk (02/27/2023)    Readmission Risk Interventions     No data to display

## 2023-02-28 NOTE — Assessment & Plan Note (Addendum)
AAOx4 but otherwise has tangential speech and describes be psychoanalyzed. Quite disorganized this AM.  - Psych consulted, appreciate recommendations - Haldol 5 mg q8 PRN for agitation

## 2023-02-28 NOTE — Care Management Obs Status (Signed)
MEDICARE OBSERVATION STATUS NOTIFICATION   Patient Details  Name: VALERIA KRISKO MRN: [id number] Date of Birth: 01-Sep-1940   Medicare Observation Status Notification Given:  Yes    Harriet Masson, RN 02/28/2023, 3:04 PM

## 2023-02-28 NOTE — Assessment & Plan Note (Signed)
UDS: Positive nitrites, positive leukocytes.  Patient incontinent of urine on exam, malodorous.  No hematuria. No suprapubic tenderness.  - Ucx ordered. - S/p IV ceftriaxone 1 g. - Start cefadroxil 500 mg twice daily.

## 2023-02-28 NOTE — TOC Initial Note (Addendum)
Transition of Care [facility]) - Initial/Assessment Note    Patient Details  Name: [name] MRN: [id number] Date of Birth: 1940/03/04  Transition of Care [facility]) CM/SW Contact:    Harriet Masson, RN Phone Number: 02/28/2023, 3:11 PM  Clinical Narrative:                  Spoke to patient's sister, Corrie Dandy, regarding transition needs.  Corrie Dandy states patient sometimes lives with her but not all the time. Corrie Dandy states patient leaves her guestroom like a Biomedical scientist.  Corrie Dandy is unsure if she will allow patient to return to her home. Address on file is [facility]. Patient stays in homeless camp and shelters when not at her home.  Patient is followed at [facility]. Corrie Dandy is requesting a bath aide if patient returns to live with her. Left VM with [facility] [name]. [phone] ext [phone] Due to patient's psych issues home health can not be arranged.   Expected Discharge Plan: Home w Home Health Services Barriers to Discharge: Continued Medical Work up, Homeless with medical needs   Patient Goals and CMS Choice            Expected Discharge Plan and Services       Living arrangements for the past 2 months: Single Family Home, Homeless Shelter                                      Prior Living Arrangements/Services Living arrangements for the past 2 months: Single Family Home, Homeless Shelter Lives with:: Siblings, Facility Resident Patient language and need for interpreter reviewed:: Yes        Need for Family Participation in Patient Care: Yes (Comment) Care giver support system in place?: Yes (comment) Current home services: DME (walker) Criminal Activity/Legal Involvement Pertinent to Current Situation/Hospitalization: No - Comment as needed  Activities of Daily Living   ADL Screening (condition at time of admission) Independently performs ADLs?: Yes (appropriate for developmental age) Is the patient deaf or have difficulty hearing?: Yes Does the patient have difficulty seeing,  even when wearing glasses/contacts?: No Does the patient have difficulty concentrating, remembering, or making decisions?: Yes  Permission Sought/Granted Permission sought to share information with : Family Supports Permission granted to share information with : No (Contact information on chart)  Share Information with NAME: [name]     Permission granted to share info w Relationship: Sister  Permission granted to share info w Contact Information: [phone]  Emotional Assessment   Attitude/Demeanor/Rapport: Unable to Assess Affect (typically observed): Unable to Assess Orientation: : Oriented to Self, Oriented to Place Alcohol / Substance Use: Not Applicable Psych Involvement: Yes (comment)  Admission diagnosis:  Schizoaffective disorder, bipolar type (HCC) [F25.0] Ataxia [R27.0] Acute cystitis without hematuria [N30.00] AKI (acute kidney injury) (HCC) [N17.9] Patient Active Problem List   Diagnosis Date Noted   Acute cystitis without hematuria 02/28/2023   Ataxia 02/28/2023   AKI (acute kidney injury) (HCC) 02/27/2023   Social problem 02/18/2023   Urinary urgency 01/17/2023   Murmur 01/16/2023   UGI bleed 02/06/2013   Schizophrenia, chronic condition with acute exacerbation (HCC) 02/06/2013   Microcytic anemia 05/02/2012   GI bleed 05/02/2012   Acute renal failure (HCC) 05/02/2012   Schizoaffective disorder, bipolar type (HCC) 06/14/2011   Hepatitis C 06/13/2011   PCP:  No primary care provider on file. Pharmacy:   [facility] [phone] Gin[place] Otto, [place] -  [address] AT [facility] [address] [place] [address] Phone: [phone] Fax: [phone]  [id number] - [address] [place]), [place] - [address] [place] [address] Phone: [phone] Fax: [phone]  [facility] [address] [place] [address] Phone: [phone]  Fax: [phone]     Social Drivers of Health (SDOH) Social History: SDOH Screenings   Food Insecurity: Food Insecurity Present (02/28/2023)  Housing: High Risk (02/28/2023)  Transportation Needs: Patient Unable To Answer (02/28/2023)  Utilities: Not At Risk (02/28/2023)  Social Connections: Patient Declined (02/28/2023)  Tobacco Use: Medium Risk (02/27/2023)   SDOH Interventions: Food Insecurity Interventions: [facility] Provided, Inpatient TOC Housing Interventions: Community Resources Provided   Readmission Risk Interventions     No data to display

## 2023-02-28 NOTE — Progress Notes (Signed)
Chaplain responded to a consult to visit Pt. Pt was difficult to follow, since mentioned having problems ranging from hearing, to sight. Chaplain found it difficult to talk with Pt, since Pt was constantly stating he needed assistance because he has been arrested in [place] multiple time. 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 Pt also stated he has no children and parents are both deceased. Pt states he is homeless. Pt also started praying for restoration of his health. Pt stated he wanted to find a solution for his deafness. Chaplain briefly prayed with Pt in a loud voice. Pt remained quiet for a moment and then stated he needed assistance for his leg. Chaplain asked Pt to remain in bed. Chaplain couldn't have an opportunity to have a stable conversation with Pt.  Oneida Alar Chaplain Resident

## 2023-02-28 NOTE — Assessment & Plan Note (Signed)
On initial exam, patient had difficulty rising from a seated position.  CT head without contrast WNL. - B12, RPR ordered. - PT/OT - RD consult

## 2023-02-28 NOTE — TOC CM/SW Note (Signed)
1352 Left VM for patient's sister, Corrie Dandy, @ [phone] requesting return call to complete [facility] letter for observation status.

## 2023-02-28 NOTE — TOC CM/SW Note (Addendum)
Left VM for patient's sister, Corrie Dandy, @ [phone] requesting return call.

## 2023-02-28 NOTE — Assessment & Plan Note (Addendum)
Upon admission patient had difficulty rising from a seated position. CT head w/o contrast negative. B12 elevated 1,942.  - Thiamine level - Thiamine 100 mg daily - If changes/worsens will consider MRI wo contrast

## 2023-03-01 ENCOUNTER — Encounter (HOSPITAL_COMMUNITY): Payer: Self-pay | Admitting: Family Medicine

## 2023-03-01 DIAGNOSIS — I110 Hypertensive heart disease with heart failure: Secondary | ICD-10-CM | POA: Diagnosis present

## 2023-03-01 DIAGNOSIS — Z9152 Personal history of nonsuicidal self-harm: Secondary | ICD-10-CM | POA: Diagnosis not present

## 2023-03-01 DIAGNOSIS — E875 Hyperkalemia: Secondary | ICD-10-CM | POA: Diagnosis present

## 2023-03-01 DIAGNOSIS — N400 Enlarged prostate without lower urinary tract symptoms: Secondary | ICD-10-CM | POA: Diagnosis present

## 2023-03-01 DIAGNOSIS — Z91148 Patient's other noncompliance with medication regimen for other reason: Secondary | ICD-10-CM | POA: Diagnosis not present

## 2023-03-01 DIAGNOSIS — F0390 Unspecified dementia without behavioral disturbance: Secondary | ICD-10-CM | POA: Diagnosis present

## 2023-03-01 DIAGNOSIS — R0789 Other chest pain: Secondary | ICD-10-CM | POA: Diagnosis not present

## 2023-03-01 DIAGNOSIS — I471 Supraventricular tachycardia, unspecified: Secondary | ICD-10-CM | POA: Diagnosis not present

## 2023-03-01 DIAGNOSIS — R079 Chest pain, unspecified: Secondary | ICD-10-CM | POA: Diagnosis not present

## 2023-03-01 DIAGNOSIS — Z5900 Homelessness unspecified: Secondary | ICD-10-CM | POA: Diagnosis not present

## 2023-03-01 DIAGNOSIS — I5031 Acute diastolic (congestive) heart failure: Secondary | ICD-10-CM | POA: Diagnosis present

## 2023-03-01 DIAGNOSIS — N433 Hydrocele, unspecified: Secondary | ICD-10-CM | POA: Diagnosis present

## 2023-03-01 DIAGNOSIS — B9620 Unspecified Escherichia coli [E. coli] as the cause of diseases classified elsewhere: Secondary | ICD-10-CM | POA: Diagnosis present

## 2023-03-01 DIAGNOSIS — R451 Restlessness and agitation: Secondary | ICD-10-CM | POA: Diagnosis not present

## 2023-03-01 DIAGNOSIS — R1310 Dysphagia, unspecified: Secondary | ICD-10-CM | POA: Diagnosis present

## 2023-03-01 DIAGNOSIS — F259 Schizoaffective disorder, unspecified: Secondary | ICD-10-CM | POA: Diagnosis not present

## 2023-03-01 DIAGNOSIS — F209 Schizophrenia, unspecified: Secondary | ICD-10-CM | POA: Diagnosis not present

## 2023-03-01 DIAGNOSIS — Z87891 Personal history of nicotine dependence: Secondary | ICD-10-CM | POA: Diagnosis not present

## 2023-03-01 DIAGNOSIS — R000 Tachycardia, unspecified: Secondary | ICD-10-CM | POA: Diagnosis not present

## 2023-03-01 DIAGNOSIS — N3000 Acute cystitis without hematuria: Secondary | ICD-10-CM

## 2023-03-01 DIAGNOSIS — N179 Acute kidney failure, unspecified: Principal | ICD-10-CM | POA: Diagnosis present

## 2023-03-01 DIAGNOSIS — I350 Nonrheumatic aortic (valve) stenosis: Secondary | ICD-10-CM | POA: Diagnosis present

## 2023-03-01 DIAGNOSIS — E43 Unspecified severe protein-calorie malnutrition: Secondary | ICD-10-CM | POA: Diagnosis present

## 2023-03-01 DIAGNOSIS — F200 Paranoid schizophrenia: Secondary | ICD-10-CM | POA: Diagnosis not present

## 2023-03-01 DIAGNOSIS — I4510 Unspecified right bundle-branch block: Secondary | ICD-10-CM | POA: Diagnosis present

## 2023-03-01 DIAGNOSIS — Z79899 Other long term (current) drug therapy: Secondary | ICD-10-CM | POA: Diagnosis not present

## 2023-03-01 DIAGNOSIS — H9190 Unspecified hearing loss, unspecified ear: Secondary | ICD-10-CM | POA: Diagnosis present

## 2023-03-01 DIAGNOSIS — F250 Schizoaffective disorder, bipolar type: Secondary | ICD-10-CM | POA: Diagnosis present

## 2023-03-01 HISTORY — DX: Dysphagia, unspecified: R13.10

## 2023-03-01 LAB — BASIC METABOLIC PANEL
Anion gap: 8 (ref 5–15)
BUN: 11 mg/dL (ref 8–23)
CO2: 24 mmol/L (ref 22–32)
Calcium: 9.2 mg/dL (ref 8.9–10.3)
Chloride: 106 mmol/L (ref 98–111)
Creatinine, Ser: 0.97 mg/dL (ref 0.61–1.24)
GFR, Estimated: >60 mL/min (ref >60)
Glucose, Bld: 102 mg/dL — ABNORMAL HIGH (ref 70–99)
Potassium: 3.8 mmol/L (ref 3.5–5.1)
Sodium: 138 mmol/L (ref 135–145)

## 2023-03-01 MED ORDER — AMLODIPINE BESYLATE 5 MG PO TABS
5.0000 mg | ORAL_TABLET | Freq: Every day | ORAL | Status: DC
  Administered 2023-03-01 – 2023-03-04 (×4): 5 mg via ORAL
  Filled 2023-03-01 (×7): qty 1

## 2023-03-01 MED ORDER — HALOPERIDOL 5 MG PO TABS
15.0000 mg | ORAL_TABLET | Freq: Every day | ORAL | Status: DC
  Administered 2023-03-01 – 2023-03-03 (×3): 15 mg via ORAL
  Filled 2023-03-01 (×4): qty 3

## 2023-03-01 MED ORDER — FUROSEMIDE 10 MG/ML IJ SOLN
20.0000 mg | Freq: Once | INTRAMUSCULAR | Status: AC
  Administered 2023-03-01: 20 mg via INTRAVENOUS
  Filled 2023-03-01: qty 4

## 2023-03-01 MED ORDER — FUROSEMIDE 40 MG PO TABS
20.0000 mg | ORAL_TABLET | Freq: Every day | ORAL | Status: AC
  Administered 2023-03-02 – 2023-03-04 (×3): 20 mg via ORAL
  Filled 2023-03-01 (×3): qty 1

## 2023-03-01 NOTE — Progress Notes (Addendum)
 Mobility Specialist Progress Note:   03/01/23 1223  Mobility  Activity Ambulated with assistance in hallway  Level of Assistance Contact guard assist, steadying assist  Assistive Device None  Distance Ambulated (ft) 80 ft  Activity Response Tolerated well  Mobility Referral Yes  Mobility visit 1 Mobility  Mobility Specialist Start Time (ACUTE ONLY) 1200  Mobility Specialist Stop Time (ACUTE ONLY) 1210  Mobility Specialist Time Calculation (min) (ACUTE ONLY) 10 min   Pt received in bed, agreeable to mobility. SB to stand. CG during ambulation. Pt c/o slight leg weakness upon standing, otherwise asx throughout. No unsteadiness or LOB present. Pt left in bed with call bell in reach and all needs met.   Brown Husband  Mobility Specialist Please contact via [employer] office at [phone]

## 2023-03-01 NOTE — Plan of Care (Signed)

## 2023-03-01 NOTE — Assessment & Plan Note (Signed)
Upon admission patient had difficulty rising from a seated position. CT head w/o contrast negative. B12 elevated 1,942.  - Thiamine level pending  - Thiamine 100 mg daily - If changes/worsens will consider MRI wo contrast

## 2023-03-01 NOTE — Assessment & Plan Note (Signed)
 UA positive for leukocytes and nitrites. S/p CTX in ED. Endorses increased urinary urgency. Denies any burning or hematuria. Urine culture positive for E Coli and will continue on antibiotics.   - Continue Cefadroxil  500 mg BID (2/2-2/7), awaiting susceptibilities  - Urine culture positive w/ 100,000 E Coli

## 2023-03-01 NOTE — Assessment & Plan Note (Signed)
Nursing reported patient had difficulty with swallowing and choking episode overnight. Patient had breakfast this morning during evaluation.  - SLP consult ordered

## 2023-03-01 NOTE — Assessment & Plan Note (Signed)
 AAOx4 but otherwise has tangential speech and continues to be disorganized. Hyper-religious.   - Psych c/s: recs increasing haldol  15 mg at bedtime and recommend transitioning back to LAI, recommend inpatient psych once medically clear, please IVC if attempts to leave hospital.  - Haldol  5 mg q8 PRN for agitation

## 2023-03-01 NOTE — Evaluation (Signed)
 Clinical/Bedside Swallow Evaluation Patient Details  Name: [name] MRN: [id number] Date of Birth: 07/01/1940  Today's Date: 03/01/2023 Time: SLP Start Time (ACUTE ONLY): 1240 SLP Stop Time (ACUTE ONLY): 1254 SLP Time Calculation (min) (ACUTE ONLY): 14 min  Past Medical History:  Past Medical History:  Diagnosis Date   AKI (acute kidney injury) (HCC) 02/27/2023   AMS (altered mental status) 01/06/2023   Anemia 08/31/2012   Benign hypertrophy of prostate    Hepatitis C    Hypokalemia 02/06/2013   Schizophrenia (HCC)    Swelling of lower extremity 01/13/2023   Urinary tract infection    UTI (urinary tract infection) 06/13/2011   IMO SNOMED Dx Update Oct 2024     Past Surgical History:  Past Surgical History:  Procedure Laterality Date   KNEE SURGERY     right   HPI:  Pt is an 83 y/o M presenting to ED on 2/2 with blackout spells and urinary urgency, admitted for AKI. Swallow eval was ordered on 2/4 due to nursing report of choking during dinner the night before. CXR clear on admission. PMH includes schizoaffective disorder, homelessness, AMS, bipolar, HTN, hep C, BPH    Assessment / Plan / Recommendation  Clinical Impression  Pt's oropharyngeal function appears to be [facility], but nursing describes an episode last night in which he choked on turkey. Pt says he remembers this but is a limited historian and attributes his difficulty to the odor being put on the food which causes him to regurgitate. Question if he may have an esophageal component but no overt symptoms of dysphagia are observed today. SLP will f/u at least briefly given concerns from nursing staff but will leave current diet in place. If additional difficulty is observed with solids, nursing can place on mechanical soft diet pending SLP reassessment.  SLP Visit Diagnosis: Dysphagia, unspecified (R13.10)    Aspiration Risk  Mild aspiration risk    Diet Recommendation Regular;Thin liquid    Liquid Administration  via: Cup;Straw Medication Administration: Whole meds with liquid Supervision: Patient able to self feed;Intermittent supervision to cue for compensatory strategies Compensations: Minimize environmental distractions;Slow rate;Small sips/bites Postural Changes: Seated upright at 90 degrees;Remain upright for at least 30 minutes after po intake    Other  Recommendations Oral Care Recommendations: Oral care BID    Recommendations for follow up therapy are one component of a multi-disciplinary discharge planning process, led by the attending physician.  Recommendations may be updated based on patient status, additional functional criteria and insurance authorization.  Follow up Recommendations No SLP follow up      Assistance Recommended at Discharge    Functional Status Assessment Patient has had a recent decline in their functional status and demonstrates the ability to make significant improvements in function in a reasonable and predictable amount of time.  Frequency and Duration min 2x/week  1 week       Prognosis Prognosis for improved oropharyngeal function: Good Barriers to Reach Goals: Cognitive deficits      Swallow Study   General HPI: Pt is an 83 y/o M presenting to ED on 2/2 with blackout spells and urinary urgency, admitted for AKI. Swallow eval was ordered on 2/4 due to nursing report of choking during dinner the night before. CXR clear on admission. PMH includes schizoaffective disorder, homelessness, AMS, bipolar, HTN, hep C, BPH Type of Study: Bedside Swallow Evaluation Previous Swallow Assessment: none in chart Diet Prior to this Study: Regular;Thin liquids (Level 0) Temperature Spikes Noted: No Respiratory Status:  Room air History of Recent Intubation: No Behavior/Cognition: Alert;Cooperative;Distractible;Requires cueing Oral Cavity Assessment: Within Functional Limits Oral Care Completed by SLP: No Oral Cavity - Dentition: Poor condition;Missing  dentition Vision: Functional for self-feeding Self-Feeding Abilities: Able to feed self Patient Positioning: Other (comment) (standing) Baseline Vocal Quality: Normal Volitional Cough: Strong Volitional Swallow: Able to elicit    Oral/Motor/Sensory Function Overall Oral Motor/Sensory Function: Within functional limits   Ice Chips Ice chips: Not tested   Thin Liquid Thin Liquid: Within functional limits Presentation: Self Fed;Straw    Nectar Thick Nectar Thick Liquid: Not tested   Honey Thick Honey Thick Liquid: Not tested   Puree Puree: Within functional limits Presentation: Self Fed;Spoon   Solid     Solid: Within functional limits Presentation: Self Fed      Leita SAILOR., M.A. CCC-SLP Acute Rehabilitation Services Office [phone]  Secure chat preferred  03/01/2023,2:05 PM

## 2023-03-01 NOTE — Assessment & Plan Note (Deleted)
UA positive for leukocytes and nitrites. S/p CTX in ED. Endorses increased urinary frequency.  - Cefadroxil 500 mg BID (2/2-2/7) - Urine culture pending, follow-up results

## 2023-03-01 NOTE — Progress Notes (Addendum)
 FMTS Attending Daily Note: Suzann Daring, MD  Team Pager [phone] Pager [phone]  I have seen and examined this patient, and reviewed their chart. I have discussed this patient with the resident. I agree with the resident's findings, assessment and care plan.  In addition to below   Elevated right-sided heart pressures, likely due to some left hypertrophy and underlying lung disease.  He has significant scrotal edema this will start Lasix .  This is going to be a challenge for him if he is managing his own medications as an outpatient as it places him at significant risk for getting dehydrated we will see how he does over the next few days with that here.  Complaints of dysphagia, speech therapy to see.  EKG did not show atrial fibrillation but just showed normal sinus rhythm with some premature complexes.  Disposition: Psychiatry recommending inpatient psychiatric care after this hospital stay.  He is approaching medical stability for this.       Daily Progress Note Intern Pager: [phone]  Patient name: [name] Medical record number: [id number] Date of birth: 01/31/1940 Age: 83 y.o. Gender: male  Primary Care Provider: [facility] Consultants: Psychiatry  Code Status: Full code   Pt Overview and Major Events to Date:  2/2: Admitted 2/3: Psych consult placed  2/4 : Psych recs increasing Haldol  15 mg at bedtime   Assessment and Plan:  [name] is a 83 y.o. male with a past medical history of schizoaffective disorder, bipolar type treated with Haldol  10 at bedtime, HTN, hepatitis C, hearing loss, BPH and housing insecurity presenting with dizzy spells, gait ataxia, and disorganized thinking and admitted for AKI and UTI.  Assessment & Plan Urinary tract infection UA positive for leukocytes and nitrites. S/p CTX in ED. Endorses increased urinary urgency. Denies any burning or hematuria. Urine culture positive for E Coli and will continue on antibiotics.   -  Continue Cefadroxil  500 mg BID (2/2-2/7), awaiting susceptibilities  - Urine culture positive w/ 100,000 E Coli  Schizoaffective disorder, bipolar type (HCC) AAOx4 but otherwise has tangential speech and continues to be disorganized. Hyper-religious.   - Psych c/s: recs increasing haldol  15 mg at bedtime and recommend transitioning back to LAI, recommend inpatient psych once medically clear, please IVC if attempts to leave hospital.  - Haldol  5 mg q8 PRN for agitation Dysphagia Nursing reported patient had difficulty with swallowing and choking episode overnight. Patient had breakfast this morning during evaluation.  - SLP consult ordered   Ataxia Upon admission patient had difficulty rising from a seated position. CT head w/o contrast negative. B12 elevated 1,942.  - Thiamine  level pending  - Thiamine  100 mg daily - If changes/worsens will consider MRI wo contrast Murmur Systolic murmur noted in RUSB. ECHO demonstrated EF 60-65%, with mild valvular disease. Patient appears fluid overloaded on exam, with swollen testicles. Will diuresis and reassess volume status.  - Lasix  20 mg x1 given fluid overload  - BMP and Mag   AKI (acute kidney injury) (HCC) (Resolved: 03/01/2023) Cr elevated to 1.32 on arrival improved after 1L NS bolus on admission. Cr 0.97 this morning and patient tolerating PO - Continue to monitor with BMP - Encourage PO Chronic and Stable Problems:  BPH: Restarted home Flomax  0.4 mg nightly HTN: Hypertensive to 150s last 24 hours. Will start Amlodipine  5 mg to optimize BP control.  Hyperkalemia: (resolved on 2/3). K 3.8. CTM BMP  . EKG abnormality: Atrial flutter vs NSR with significant PACs (2/3)  FEN/GI: Regular diet PPx: Lovenox  Dispo: Inpatient psychiatric hospitalization, once medically cleared, Please IVC if attempts to leave  Subjective:  Patient disorganized and tangential during conversation. States urgency with urination. Shows me notebook to communicate  thoughts.   Objective: Temp:  [97.6 F (36.4 C)-98.3 F (36.8 C)] 98 F (36.7 C) (02/04 0802) Pulse Rate:  [66-87] 71 (02/04 0802) Resp:  [18] 18 (02/04 0802) BP: (125-151)/(83-93) 143/85 (02/04 0802) SpO2:  [96 %-100 %] 98 % (02/04 0802) Physical Exam: General: AAOx4 in NAD Cardiovascular: RRR. 2/6 systolic murmur in RUSB. Respiratory: CTAB. Normal WOB on RA.  Abdomen: Soft, non-tender, non-distended. Normoactive bowel sounds. Extremities: No BLE edema.  Laboratory: Most recent CBC Lab Results  Component Value Date   WBC 5.6 02/27/2023   HGB 14.9 02/27/2023   HCT 46.0 02/27/2023   MCV 89.5 02/27/2023   PLT 237 02/27/2023   Most recent BMP    Latest Ref Rng & Units 02/28/2023    1:58 AM  BMP  Glucose 70 - 99 mg/dL 86   BUN 8 - 23 mg/dL 16   Creatinine 9.38 - 1.24 mg/dL 8.98   Sodium 864 - 854 mmol/L 137   Potassium 3.5 - 5.1 mmol/L 4.4   Chloride 98 - 111 mmol/L 108   CO2 22 - 32 mmol/L 20   Calcium  8.9 - 10.3 mg/dL 8.6    Other pertinent labs   RPR negative   Urine Culture 100,000 E Coli   Imaging/Diagnostic Tests: No new imaging   Lenard Calin, MD 03/01/2023, 8:07 AM  PGY-1, [facility], Psychiatry Residency FPTS Intern pager: [phone], text pages welcome Secure chat group [facility] Teaching Service

## 2023-03-01 NOTE — Assessment & Plan Note (Signed)
Systolic murmur noted in RUSB. ECHO demonstrated EF 60-65%, with mild valvular disease. Patient appears fluid overloaded on exam, with swollen testicles. Will diuresis and reassess volume status.  - Lasix 20 mg x1 given fluid overload  - BMP and Mag

## 2023-03-01 NOTE — Progress Notes (Signed)
 Physical Therapy Treatment Patient Details Name: [name] MRN: [id number] DOB: 03-24-1940 Today's Date: 03/01/2023   History of Present Illness Pt is an 83 y/o M presenting to ED on 2/2 with blackout spells and urinary urgency, admitted for AKI. PMH includes schizoaffective disorder, homelessness, AMS, bipolar, HTN, hep C, BPH    PT Comments  Pt received sitting EOB and perseverating on wanting a billing statement from the hospital. Difficult to redirect throughout treatment. Pt ambulated without AD and with a trial of SPC. Pt was more steady with use of cane. He did sometimes need cueing to keep using it instead of holding it off the floor but kept it down 75% of time. Pt ambulated 200' with CGA.  Worked on standing balance activities in room. PT will continue to follow.      If plan is discharge home, recommend the following: Assistance with cooking/housework;Supervision due to cognitive status   Can travel by private vehicle        Equipment Recommendations  Cane    Recommendations for Other Services       Precautions / Restrictions Precautions Precautions: Fall Restrictions Weight Bearing Restrictions Per Provider Order: No Other Position/Activity Restrictions: very HOH     Mobility  Bed Mobility Overal bed mobility: Independent             General bed mobility comments: pt up in room when PT arrived    Transfers Overall transfer level: Needs assistance Equipment used: None Transfers: Sit to/from Stand Sit to Stand: Contact guard assist           General transfer comment: CGA for safety. Pt with attentions deficits and decreased awareness of safety. Has dropped items all over floor and stepping over and around them    Ambulation/Gait Ambulation/Gait assistance: Contact guard assist Gait Distance (Feet): 200 Feet Assistive device: Straight cane Gait Pattern/deviations: Step-through pattern, Decreased step length - right, Decreased step length -  left Gait velocity: decreased Gait velocity interpretation: <1.8 ft/sec, indicate of risk for recurrent falls   General Gait Details: pt steadier with use of SPC though he sometimes picks up off floor and holds it while walking. He then got sidetracked by the memory of a cane that he used to have that was stolen.   Stairs             Wheelchair Mobility     Tilt Bed    Modified Rankin (Stroke Patients Only)       Balance Overall balance assessment: Needs assistance Sitting-balance support: Feet supported Sitting balance-Leahy Scale: Good     Standing balance support: During functional activity, Reliant on assistive device for balance Standing balance-Leahy Scale: Fair Standing balance comment: limitations evident with standing reaching activities                            Cognition Arousal: Alert Behavior During Therapy: WFL for tasks assessed/performed Overall Cognitive Status: Difficult to assess                                 General Comments: unsure of how far off baseline pt is. Tangential speech throughout session        Exercises      General Comments General comments (skin integrity, edema, etc.): VSS on RA      Pertinent Vitals/Pain Pain Assessment Pain Assessment: Faces Faces Pain Scale: No hurt  Home Living                          Prior Function            PT Goals (current goals can now be found in the care plan section) Acute Rehab PT Goals Patient Stated Goal: get a billing statement, get a cane and a w/c PT Goal Formulation: Patient unable to participate in goal setting Time For Goal Achievement: 03/14/23 Potential to Achieve Goals: Good Progress towards PT goals: Progressing toward goals    Frequency    Min 1X/week      PT Plan      Co-evaluation              AM-PAC PT 6 Clicks Mobility   Outcome Measure  Help needed turning from your back to your side while in a  flat bed without using bedrails?: None Help needed moving from lying on your back to sitting on the side of a flat bed without using bedrails?: None Help needed moving to and from a bed to a chair (including a wheelchair)?: None Help needed standing up from a chair using your arms (e.g., wheelchair or bedside chair)?: A Little Help needed to walk in hospital room?: A Little Help needed climbing 3-5 steps with a railing? : A Little 6 Click Score: 21    End of Session Equipment Utilized During Treatment: Gait belt Activity Tolerance: Patient tolerated treatment well Patient left: with call bell/phone within reach;in chair Nurse Communication: Mobility status PT Visit Diagnosis: Unsteadiness on feet (R26.81)     Time: 1531-1600 PT Time Calculation (min) (ACUTE ONLY): 29 min  Charges:    $Gait Training: 23-37 mins PT General Charges $$ ACUTE PT VISIT: 1 Visit                     [name], PT  Acute Rehab Services Secure chat preferred Office [phone]    [name] 03/01/2023, 4:15 PM

## 2023-03-01 NOTE — Assessment & Plan Note (Signed)
Cr elevated to 1.32 on arrival improved after 1L NS bolus on admission. Cr 0.97 this morning and patient tolerating PO - Continue to monitor with BMP - Encourage PO

## 2023-03-01 NOTE — Consult Note (Signed)
 [facility] Psychiatric Consult Initial  Patient Name: .[name]  MRN: [id number]  DOB: February 19, 1940  Consult Order details:  Orders (From admission, onward)     Start     Ordered   02/28/23 1139  IP CONSULT TO PSYCHIATRY       Comments: He can be seen tomorrow  Ordering Provider: Lenard Calin, MD  Provider:  (Not yet assigned)  Question Answer  Location [facility]  Reason for Consult? 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 male admitted: Medically for 02/27/2023  9:59 AM for acute kidney injury, altered mental status. He carries the psychiatric diagnoses of schizophrenia and has a past medical history of protein-calorie malnutrition, neurosyphilis, hearing loss, hepatitis C, anemia, BPH, and repeated urinary tract infections in a male.   His current presentation of altered mental status is most consistent with schizoaffective disorder with medication non-adherence. He does have several other medical issues that could be contributing to his  He meets criteria for schizophrenia based on historical chart data, current disorganization.  Current outpatient psychotropic medications include haldol  10 mg QHS and historically he has had a fair response to these medications. He was not compliant with medications prior to admission as evidenced by current disorganization and patient report. On initial examination, patient is disorganized, including hyperreligiosity,  experiencing gloom, a pulling sensation on the inside of my face that is pulling me down. Please see plan below for detailed recommendations.   Diagnoses:  Active Hospital  problems: Principal Problem:   AKI (acute kidney injury) (HCC) Active Problems:   Schizoaffective disorder, bipolar type (HCC)   Murmur   Acute cystitis without hematuria   Ataxia   Protein-calorie malnutrition, severe    Plan   ## Psychiatric Medication Recommendations:  -- Increase haloperidol  from 10 mg nightly to 15 mg nightly -- Once patient stabilizes on a dose of haloperidol , recommend transitioning him back to haloperidol  decanoate.  ## Medical Decision Making Capacity: Not specifically addressed in this encounter  ## Further Work-up:  While pt on Qtc prolonging medications, please monitor & replete K+ to 4 and Mg2+ to 2 -- most recent EKG on 2/3 had QtC of 407 -- Pertinent labwork reviewed earlier this admission includes: TSH (WNL), B12 (1940-H), UA (consistent with UTI), improving GFR   ## Disposition:-- We recommend inpatient psychiatric hospitalization when medically cleared. Patient is under voluntary admission status at this time; please IVC if attempts to leave hospital. Patient has services through the [facility] department, and might be appropriate for hospitalization and housing assistance through the [facility] office.  ## Behavioral / Environmental: -Patient would benefit from more frequent contact with medical team to delineate plan of care and allow for clarification questions, which will help alleviate anxiety regarding treatment. If possible, try to check back in with the pt in the afternoon.    ## Safety and Observation Level:  - Based on my clinical evaluation, I estimate the patient to be at low risk of self harm in the current setting. - At this time, we recommend  routine. This decision is based on  my review of the chart including patient's history and current presentation, interview of the patient, mental status examination, and consideration of suicide risk including evaluating suicidal ideation, plan, intent, suicidal or self-harm behaviors,  risk factors, and protective factors. This judgment is based on our ability to directly address suicide risk, implement suicide prevention strategies, and develop a safety plan while the patient is in the clinical setting. Please contact our team if there is a concern that risk level has changed.  CSSR Risk Category:C-SSRS RISK CATEGORY: No Risk  Suicide Risk Assessment: Patient has following modifiable risk factors for suicide: under treated depression , recklessness, and medication noncompliance, which we are addressing by restarting medication, recommending inpatient psychiatric stay once medically stabilized. Patient has following non-modifiable or demographic risk factors for suicide: male gender, history of self harm behavior, and psychiatric hospitalization Patient has the following protective factors against suicide: Supportive friends, Cultural, spiritual, or religious beliefs that discourage suicide, and Frustration tolerance  Thank you for this consult request. Recommendations have been communicated to the primary team.  We will follow at this time.   Lynwood Morene Lavone Delsie, MD       History of Present Illness  Relevant Aspects of [facility] Course:  Admitted on 02/27/2023 for feeling faint, AMS; found to have AKI and acute cystitis without hematuria.   Patient Report:  Met patient bedside, first alone and then with attending physician Dr Harrison. Pt was agreeable to interviews and cooperative. Patient had pressured speech and often went on hyper religious tangents. Pt was focused on a few topics: one, his urinary tract infections. He kept insisting that a doctor at the other hospital had promised to put him to sleep and take out the infections. He wants us  to find that doctor. He has had gradual declines of his hearing and vision. He would like those fixed. He also mentions that he wants his Hepatitis C treatment completed. He believes it was interrupted when he was  over at the [facility].   He was challenging to redirect, but was generally pleasant. He reported history of significant depression that made it hard for him to do much of anything. He denied SI/HI/AVH.   Psych ROS:  Depression: Reports decreased sleep,  Anxiety:  Denies significant symptoms of worry except regarding his various medical problems. Mania (lifetime and current): Patient denies a history of mania. Unable to remember how much he's been sleeping lately.  Psychosis: (lifetime and current): Patient reports that he has been psychotic before. Could not provide further details.  Collateral information:  Jhonny Essie Shuck (Sister) [phone] ([place]) on 03/01/2023 at 12:10 pm. No answer.  Review of Systems  Constitutional:  Negative for chills, fever, malaise/fatigue and weight loss.  HENT:  Positive for hearing loss.   Eyes:  Positive for blurred vision.  Respiratory:  Negative for cough.   Cardiovascular:  Negative for chest pain.  Neurological:  Positive for headaches. Negative for tingling.  Psychiatric/Behavioral:  Positive for depression. Negative for hallucinations, memory loss, substance abuse and suicidal ideas. The patient is nervous/anxious. The patient does not have insomnia.      Psychiatric and Social History  Psychiatric History:  Information collected from chart  Prev Dx/Sx: schizophrenia vs schizoaffective disorder, bipolar type Current Psych Provider: [facility] Home Meds (current): haloperidol  10 mg at bedtime (non compliant) and haloperidol  decanoate, most recent injection 01/18/23 Previous Med Trials: ziprasidone , risperidone , ativan , zolplidem Therapy: not currently  Prior Psych Hospitalization: last 12/2022, possible others at [name]  hospital. Prior Self Harm:  Prior Violence: reports false imprisonment for armed robbery in past  Family Psych History: Patient not able to confirm Family Hx suicide: Patient not able to  confirm  Social History:  Developmental Hx: Patient not able to confirm Educational Hx: Patient not able to confirm Occupational Hx: Patient not able to confirm Legal Hx: wrongful arrest for armed robbery Not recent. Not pending. Living Situation: homeless, but leaves thing at his sister's house Spiritual Hx: Emergency Planning/management Officer Hx: [employer] during [place] [employer] Access to weapons/lethal means: denies   Substance History Alcohol: does not drink, pt too disorganized to complete review of substances. Tobacco: 06/11/1955 - 06/11/1975; Smoked an average of 1 pack/day for 20.0 years  Illicit drugs: per chart, this does not appear to be a significant problem for him. Prescription drug abuse: denies Rehab hx: denies  Exam Findings  Physical Exam: Pt is disheveled, wearing a hospital gown with minimal concern for modesty. He is transport planner. He has numerous scars on his body and pen marks are present from the two pens clutched in his left hand. He is not agitated, but he does speak very loudly as a result of his hearing impairment. He makes minimal eye contact.  Vital Signs:  Temp:  [97.6 F (36.4 C)-98.3 F (36.8 C)] 97.6 F (36.4 C) (02/04 0515) Pulse Rate:  [65-87] 66 (02/04 0515) Resp:  [18] 18 (02/04 0515) BP: (125-151)/(83-93) 151/83 (02/04 0515) SpO2:  [96 %-100 %] 100 % (02/04 0515) Blood pressure (!) 151/83, pulse 66, temperature 97.6 F (36.4 C), temperature source Oral, resp. rate 18, height 5' 8 (1.727 m), weight 80 kg, SpO2 100%. Body mass index is 26.82 kg/m.  Physical Exam Vitals and nursing note reviewed.  Constitutional:      General: He is not in acute distress.    Appearance: He is ill-appearing.  HENT:     Head: Normocephalic and atraumatic.  Skin:    General: Skin is warm and dry.  Psychiatric:        Attention and Perception: Perception normal. He is inattentive.        Mood and Affect: Mood is depressed. Affect is inappropriate.        Speech: Speech is rapid and  pressured and tangential.        Behavior: Behavior is hyperactive. Behavior is cooperative.        Thought Content: Thought content is paranoid and delusional. Thought content does not include homicidal or suicidal ideation.        Cognition and Memory: Cognition is impaired. Memory is impaired. He exhibits impaired recent memory.        Judgment: Judgment is impulsive.     Mental Status Exam: General Appearance: Disheveled and minimal concern for modesty  Orientation:  Other:  Place and person, not day or situation  Memory:  Immediate;   Fair Recent;   Poor Remote;   Fair  Concentration:  Concentration: Poor and Attention Span: Fair  Recall:  Poor  Attention  Fair  Eye Contact:  Minimal  Speech:  Pressured  Language:  Fair Pt uses a number of words incorrectly,   Volume:  Increased (hard of hearing)  Mood: Before you came in, the gloom was really bad.  Affect:  Congruent and Depressed  Thought Process:  Disorganized  Thought Content:  Illogical, Rumination, and Tangential  Suicidal Thoughts:  No  Homicidal Thoughts:  No  Judgement:  Impaired  Insight:  Present  Psychomotor Activity:  Normal  Akathisia:  No  Fund of Knowledge:  Poor      Assets:  Manufacturing Engineer  Cognition:  Impaired,  Moderate  ADL's:  Impaired  AIMS (if indicated):        Other History   These have been pulled in through the EMR, reviewed, and updated if appropriate.  Family History:  The patient's family history is not on file.  Medical History: Past Medical History:  Diagnosis Date  . AMS (altered mental status) 01/06/2023  . Anemia 08/31/2012  . Benign hypertrophy of prostate   . Hepatitis C   . Hypokalemia 02/06/2013  . Schizophrenia (HCC)   . Swelling of lower extremity 01/13/2023  . Urinary tract infection   . UTI (urinary tract infection) 06/13/2011   IMO SNOMED Dx Update Oct 2024      Surgical History: Past Surgical History:   Procedure Laterality Date  . KNEE SURGERY     right     Medications:   Current Facility-Administered Medications:  .  acetaminophen  (TYLENOL ) tablet 650 mg, 650 mg, Oral, Q6H PRN **OR** acetaminophen  (TYLENOL ) suppository 650 mg, 650 mg, Rectal, Q6H PRN, Elicia Hamlet, MD .  cefadroxil  (DURICEF) capsule 500 mg, 500 mg, Oral, BID, Rollene Katz, MD, 500 mg at 02/28/23 2123 .  enoxaparin  (LOVENOX ) injection 40 mg, 40 mg, Subcutaneous, Daily, Zheng, Jacky, MD, 40 mg at 02/28/23 2125 .  feeding supplement (ENSURE ENLIVE / ENSURE PLUS) liquid 237 mL, 237 mL, Oral, BID BM, Delores Suzann HERO, MD, 237 mL at 02/28/23 1424 .  haloperidol  (HALDOL ) tablet 10 mg, 10 mg, Oral, QHS, Zheng, Jacky, MD, 10 mg at 02/28/23 2123 .  haloperidol  (HALDOL ) tablet 5 mg, 5 mg, Oral, Q8H PRN, Lenard Calin, MD .  multivitamin with minerals tablet 1 tablet, 1 tablet, Oral, Daily, Delores Suzann HERO, MD, 1 tablet at 02/28/23 1537 .  tamsulosin  (FLOMAX ) capsule 0.4 mg, 0.4 mg, Oral, Daily, Zheng, Jacky, MD, 0.4 mg at 02/28/23 9166 .  thiamine  (VITAMIN B1) tablet 100 mg, 100 mg, Oral, Daily, Lenard Calin, MD, 100 mg at 02/28/23 1210  Allergies: No Known Allergies  Lynwood Katz Lavone Delsie, MD

## 2023-03-02 ENCOUNTER — Inpatient Hospital Stay (HOSPITAL_COMMUNITY): Payer: No Typology Code available for payment source

## 2023-03-02 DIAGNOSIS — N3000 Acute cystitis without hematuria: Secondary | ICD-10-CM | POA: Diagnosis not present

## 2023-03-02 DIAGNOSIS — N433 Hydrocele, unspecified: Secondary | ICD-10-CM | POA: Diagnosis present

## 2023-03-02 DIAGNOSIS — F250 Schizoaffective disorder, bipolar type: Secondary | ICD-10-CM

## 2023-03-02 DIAGNOSIS — F259 Schizoaffective disorder, unspecified: Secondary | ICD-10-CM

## 2023-03-02 LAB — BASIC METABOLIC PANEL
Anion gap: 8 (ref 5–15)
BUN: 10 mg/dL (ref 8–23)
CO2: 24 mmol/L (ref 22–32)
Calcium: 9.1 mg/dL (ref 8.9–10.3)
Chloride: 104 mmol/L (ref 98–111)
Creatinine, Ser: 0.96 mg/dL (ref 0.61–1.24)
GFR, Estimated: >60 mL/min (ref >60)
Glucose, Bld: 94 mg/dL (ref 70–99)
Potassium: 3.6 mmol/L (ref 3.5–5.1)
Sodium: 136 mmol/L (ref 135–145)

## 2023-03-02 LAB — MAGNESIUM: Magnesium: 1.8 mg/dL (ref 1.7–2.4)

## 2023-03-02 LAB — URINE CULTURE: Culture: >=100000 — AB

## 2023-03-02 MED ORDER — MAGNESIUM SULFATE IN D5W 1-5 GM/100ML-% IV SOLN
1.0000 g | Freq: Once | INTRAVENOUS | Status: AC
  Administered 2023-03-02: 1 g via INTRAVENOUS
  Filled 2023-03-02 (×2): qty 100

## 2023-03-02 MED ORDER — POTASSIUM CHLORIDE CRYS ER 20 MEQ PO TBCR
40.0000 meq | EXTENDED_RELEASE_TABLET | Freq: Once | ORAL | Status: AC
  Administered 2023-03-02: 40 meq via ORAL
  Filled 2023-03-02: qty 2

## 2023-03-02 NOTE — Assessment & Plan Note (Signed)
 UA positive for leukocytes and nitrites. S/p CTX in ED. Endorses increased urinary urgency. Denies any burning or hematuria. Urine culture positive for E Coli and will continue on antibiotics.   - Continue Cefadroxil  500 mg BID (2/2-2/7), awaiting susceptibilities  - Urine culture positive w/ 100,000 E Coli

## 2023-03-02 NOTE — Consult Note (Signed)
 [facility] Psychiatric Consult Initial  Patient Name: .BRADLY SANGIOVANNI  MRN: [id number]  DOB: 1940/11/27  Consult Order details:  Orders (From admission, onward)     Start     Ordered   02/28/23 1139  IP CONSULT TO PSYCHIATRY       Comments: He can be seen tomorrow  Ordering Provider: Lenard Calin, MD  Provider:  (Not yet assigned)  Question Answer  Location [facility]  Reason for Consult? 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 male admitted: Medically for 02/27/2023  9:59 AM for acute kidney injury, altered mental status. He carries the psychiatric diagnoses of schizophrenia and has a past medical history of protein-calorie malnutrition, neurosyphilis, hearing loss, hepatitis C, anemia, BPH, and repeated urinary tract infections in a male.   His current presentation of altered mental status is most consistent with schizoaffective disorder with medication non-adherence. He meets criteria for schizophrenia based on historical chart data, current disorganization.  Current outpatient psychotropic medications include haldol  10 mg QHS and historically he has had a fair response to these medications. He was not compliant with medications prior to admission as evidenced by current disorganization and patient report. We have increased his nightly dosage while inpatient to haldol  15 mg QHS. Please see plan below for detailed recommendations.   Diagnoses:  Active Hospital problems: Active Problems:   Urinary tract infection   Schizoaffective disorder, bipolar type (HCC)   Murmur   Acute cystitis without hematuria   Ataxia   Protein-calorie  malnutrition, severe   Dysphagia   AKI (acute kidney injury) (HCC)    Plan   ## Psychiatric Medication Recommendations:  -- Continue haloperidol  15 mg nightly -- Once patient stabilizes on a dose of haloperidol , recommend transitioning him back to haloperidol  decanoate.  ## Medical Decision Making Capacity: Not specifically addressed in this encounter  ## Further Work-up:  While pt on Qtc prolonging medications, please monitor & replete K+ to 4 and Mg2+ to 2 -- most recent EKG on 2/3 had QtC of 407 -- Pertinent labwork reviewed earlier this admission includes: TSH (WNL), B12 (1940-H), UA (consistent with UTI), improving GFR   ## Disposition:-- We recommend inpatient psychiatric hospitalization when medically cleared. Patient is under voluntary admission status at this time; please IVC if attempts to leave hospital. Patient has services through the [facility] department, and might be appropriate for hospitalization and housing assistance through the [facility] office.  ## Behavioral / Environmental: -Patient would benefit from more frequent contact with medical team to delineate plan of care and allow for clarification questions, which will help alleviate anxiety regarding treatment. If possible, try to check back in with the pt in the afternoon.    ## Safety and Observation Level:  - Based on my clinical evaluation, I estimate the patient to be at low risk of self harm in the current setting. - At this time, we recommend  routine. This decision is based on my review of the chart including patient's history and current presentation, interview of the patient, mental status examination, and consideration of suicide risk including evaluating  suicidal ideation, plan, intent, suicidal or self-harm behaviors, risk factors, and protective factors. This judgment is based on our ability to directly address suicide risk, implement suicide prevention strategies, and develop a safety plan while  the patient is in the clinical setting. Please contact our team if there is a concern that risk level has changed.  CSSR Risk Category:C-SSRS RISK CATEGORY: No Risk  Suicide Risk Assessment: Patient has following modifiable risk factors for suicide: under treated depression , recklessness, and medication noncompliance, which we are addressing by restarting medication, recommending inpatient psychiatric stay once medically stabilized. Patient has following non-modifiable or demographic risk factors for suicide: male gender, history of self harm behavior, and psychiatric hospitalization Patient has the following protective factors against suicide: Supportive friends, Cultural, spiritual, or religious beliefs that discourage suicide, and Frustration tolerance  Thank you for this consult request. Recommendations have been communicated to the primary team.  We will follow at this time.   Lynwood Morene Lavone Delsie, MD       History of Present Illness  Relevant Aspects of [facility] Course:  Admitted on 02/27/2023 for feeling faint, AMS; found to have AKI and acute cystitis without hematuria.   Patient Report:  Met patient bedside this AM. Patient was alert and oriented to person, place, interviewer, and situation. Not oriented to date. Patient was more linear and more coherent through most of the interview, however he did begin to become more circumstantial towards the end of reviewing his list (see media). Patient mentioned that the medicine is helping him be more clear in his thinking, but he did articulate that sometimes he has trouble understanding what people are saying when they make long sentences. Requests that we keep sentences simpler and shorter. In exchange, he said that he will let us  know if he does not understand.  Very pleasant. He reported history of significant depression that made it hard for him to do much of anything. He denied SI/HI/AVH.   Psych ROS:  Depression:  Reports good sleep,  Anxiety:  Denies significant symptoms of worry except regarding his various medical problems. Mania (lifetime and current): Patient denies a history of mania. Unable to remember how much he's been sleeping lately.  Psychosis: (lifetime and current): Patient reports that he has been psychotic before. Could not provide further details.  Collateral information:  Jhonny Essie Shuck (Sister) [phone] ([place]) on 03/01/2023 at 12:10 pm. No answer.  Review of Systems  Constitutional:  Negative for chills, fever, malaise/fatigue and weight loss.  HENT:  Positive for hearing loss.   Eyes:  Positive for blurred vision.  Respiratory:  Negative for cough.   Cardiovascular:  Negative for chest pain.  Neurological:  Positive for headaches. Negative for tingling.  Psychiatric/Behavioral:  Positive for depression. Negative for hallucinations, memory loss, substance abuse and suicidal ideas. The patient is nervous/anxious. The patient does not have insomnia.      Psychiatric and Social History  Psychiatric History:  Information collected from chart  Prev Dx/Sx: schizophrenia vs schizoaffective disorder, bipolar type Current Psych Provider: [facility] Home Meds (current): haloperidol  10 mg at bedtime (non compliant) and haloperidol  decanoate, most recent injection 01/18/23 Previous Med Trials: ziprasidone , risperidone , ativan , zolplidem Therapy: not currently  Prior Psych Hospitalization: last 12/2022, possible others at [facility] hospital. Prior Self Harm:  Prior Violence: reports false imprisonment for armed robbery in past  Family Psych History: Patient not able to confirm Family Hx suicide: Patient not able to confirm  Social History:  Developmental Hx: Patient  not able to confirm Educational Hx: Patient not able to confirm Occupational Hx: Patient not able to confirm Legal Hx: wrongful arrest for armed robbery Not recent. Not pending. Living Situation:  homeless, but leaves thing at his sister's house Spiritual Hx: Emergency Planning/management Officer Hx: [employer] during [place] [employer] Access to weapons/lethal means: denies   Substance History Alcohol: does not drink, pt too disorganized to complete review of substances. Tobacco: 06/11/1955 - 06/11/1975; Smoked an average of 1 pack/day for 20.0 years  Illicit drugs: per chart, this does not appear to be a significant problem for him. Prescription drug abuse: denies Rehab hx: denies  Exam Findings  Physical Exam: Pt is disheveled, wearing a hospital gown, but he kept adjusting the gown today to keep himself covered appropriately. He is unshaven, but was cleaner. He has numerous scars on his body and pen marks are present from the two pens clutched in his left hand. He is not agitated, but he does speak very loudly as a result of his hearing impairment. Today (2/5), he made much better eye contact.  Vital Signs:  Temp:  [97.5 F (36.4 C)-98 F (36.7 C)] 98 F (36.7 C) (02/05 0814) Pulse Rate:  [57-70] 70 (02/05 0814) Resp:  [18-19] 18 (02/05 0814) BP: (124-130)/(70-87) 130/87 (02/05 0814) SpO2:  [99 %-100 %] 99 % (02/05 0814) Blood pressure 130/87, pulse 70, temperature 98 F (36.7 C), resp. rate 18, height 5' 8 (1.727 m), weight 80 kg, SpO2 99%. Body mass index is 26.82 kg/m.  Physical Exam Vitals and nursing note reviewed.  Constitutional:      General: He is not in acute distress.    Appearance: He is ill-appearing.  HENT:     Head: Normocephalic and atraumatic.  Skin:    General: Skin is warm and dry.  Psychiatric:        Attention and Perception: Perception normal. He is inattentive.        Mood and Affect: Mood is depressed. Affect is inappropriate.        Speech: Speech is rapid and pressured and tangential.        Behavior: Behavior is hyperactive. Behavior is cooperative.        Thought Content: Thought content is paranoid and delusional. Thought content does not include homicidal or suicidal  ideation.        Cognition and Memory: Cognition is impaired. Memory is impaired. He exhibits impaired recent memory.        Judgment: Judgment is impulsive.     Mental Status Exam: General Appearance: Disheveled  Orientation:  Other:  Place and person, not day or situation  Memory:  Immediate;   Fair Recent;   Poor Remote;   Fair  Concentration:  Concentration: Fair and Attention Span: Fair  Recall:  Poor  Attention  Fair  Eye Contact:  Good  Speech:  Normal Rate  Language:  Fair Pt uses a number of words incorrectly  Volume:  Increased (hard of hearing)  Mood: I am doing better. I am getting myself right.  Affect:  Congruent and Depressed  Thought Process:  Goal Directed and Descriptions of Associations: Loose (see media tab, he has a list of things he has researched that includes many items)  Thought Content:  Illogical and Tangential  Suicidal Thoughts:  No  Homicidal Thoughts:  No  Judgement:  Fair  Insight:  Present  Psychomotor Activity:  Normal  Akathisia:  No  Fund of Knowledge:  Poor      Assets:  Communication Skills Resilience Vocational/Educational  Cognition:  Impaired,  Moderate  ADL's:  Impaired  AIMS (if indicated):        Other History   These have been pulled in through the EMR, reviewed, and updated if appropriate.  Family History:  The patient's family history is not on file.  Medical History: Past Medical History:  Diagnosis Date  . AKI (acute kidney injury) (HCC) 02/27/2023  . AMS (altered mental status) 01/06/2023  . Anemia 08/31/2012  . Benign hypertrophy of prostate   . Hepatitis C   . Hypokalemia 02/06/2013  . Schizophrenia (HCC)   . Swelling of lower extremity 01/13/2023  . Urinary tract infection   . UTI (urinary tract infection) 06/13/2011   IMO SNOMED Dx Update Oct 2024      Surgical History: Past Surgical History:  Procedure Laterality Date  . KNEE SURGERY     right     Medications:   Current  Facility-Administered Medications:  .  acetaminophen  (TYLENOL ) tablet 650 mg, 650 mg, Oral, Q6H PRN **OR** acetaminophen  (TYLENOL ) suppository 650 mg, 650 mg, Rectal, Q6H PRN, Elicia Hamlet, MD .  amLODipine  (NORVASC ) tablet 5 mg, 5 mg, Oral, Daily, Miller, Samantha, DO, 5 mg at 03/01/23 1409 .  cefadroxil  (DURICEF) capsule 500 mg, 500 mg, Oral, BID, Cleotilde Lukes, DO, 500 mg at 03/01/23 2203 .  enoxaparin  (LOVENOX ) injection 40 mg, 40 mg, Subcutaneous, Daily, Zheng, Jacky, MD, 40 mg at 03/01/23 2204 .  feeding supplement (ENSURE ENLIVE / ENSURE PLUS) liquid 237 mL, 237 mL, Oral, BID BM, Delores Suzann HERO, MD, 237 mL at 03/01/23 1409 .  furosemide  (LASIX ) tablet 20 mg, 20 mg, Oral, Daily, Delores Suzann HERO, MD .  haloperidol  (HALDOL ) tablet 15 mg, 15 mg, Oral, QHS, Delsie Lynwood Morene Lavone, MD, 15 mg at 03/01/23 2203 .  haloperidol  (HALDOL ) tablet 5 mg, 5 mg, Oral, Q8H PRN, Lenard Calin, MD .  multivitamin with minerals tablet 1 tablet, 1 tablet, Oral, Daily, Delores Suzann HERO, MD, 1 tablet at 03/01/23 9182 .  tamsulosin  (FLOMAX ) capsule 0.4 mg, 0.4 mg, Oral, Daily, Zheng, Jacky, MD, 0.4 mg at 03/01/23 9182 .  thiamine  (VITAMIN B1) tablet 100 mg, 100 mg, Oral, Daily, Lenard Calin, MD, 100 mg at 03/01/23 9182  Allergies: No Known Allergies  Lynwood Morene Lavone Delsie, MD

## 2023-03-02 NOTE — Progress Notes (Signed)
 Daily Progress Note Intern Pager: [phone]  Patient name: [name] Medical record number: [id number] Date of birth: 1940-04-17 Age: 83 y.o. Gender: male  Primary Care Provider: [facility] Consultants: Psychiatry Code Status: Full Code   Pt Overview and Major Events to Date:  2/2: Admitted 2/3: Psych consult placed  2/4 : Psych recs increased Haldol  15 mg at bedtime  2/5 : US  Scrotum    Assessment and Plan:   [name] is a 83 y.o. male with a past medical history of schizoaffective disorder, bipolar type noncompliant on home meds, HTN, hepatitis C, hearing loss, BPH and housing insecurity presenting with dizzy spells, gait ataxia, and disorganized thinking and admitted for AKI (now resolved) and UTI.   Assessment & Plan Urinary tract infection UA positive for leukocytes and nitrites. S/p CTX in ED. Endorses increased urinary urgency. Denies any burning or hematuria. Urine culture positive for E Coli and will continue on antibiotics.   - Continue Cefadroxil  500 mg BID (2/2-2/7), awaiting susceptibilities  - Urine culture positive w/ 100,000 E Coli  Schizoaffective disorder, bipolar type (HCC) AAOx4 , improved linearity, however tangential speech and continues to be somewhat disorganized. Hyper-religious.   - Psych c/s: recs continue haldol  15 mg at bedtime and recommend transitioning back to LAI, recommend inpatient psych once medically clear, please IVC if attempts to leave hospital.  - [facility] consult for inpatient psych placement  - Psych working on potential [facility] placement in [place]  - Haldol  5 mg q8 PRN for agitation Murmur Systolic murmur noted in RUSB. ECHO demonstrated EF 60-65%, with mild valvular disease.Elevated right-sided heart pressures, likely due to some left hypertrophy and underlying lung disease. He has significant scrotal edema this and will continue diuresis with lasix . - Continue Lasix  20 mg  - BMP WNL Cr. 0.96, K 3.6 and Na 136 - Mag  1.8  - CTM with BMP and Mg - Replace lytes as needed   Dysphagia Nursing reported patient had difficulty with swallowing early during admission. SLP evaluated, continue with current diet in place. Patient eating well without issues this morning   Ataxia Upon admission patient had difficulty rising from a seated position. CT head w/o contrast negative. B12 elevated 1,942.  - Thiamine  level pending  - Thiamine  100 mg daily - If changes/worsens will consider MRI wo contrast Hydrocele Patient has a chronic history of Hydrocele, Per EMR patient been complaining about scrotal edema for several years. US  Scrotum for 07/15/2014 noted bilateral hydrocele L>R.  - Repeat US  Scrotum today   Chronic and Stable Problems:  BPH: Continue home Flomax  0.4 mg nightly HTN: Normotensive and improved after starting Amlodipine . Continue Amlodipine  5 mg to optimize BP control.  Hyperkalemia: (resolved on 2/3). K 3.6. CTM BMP while on lasix . Will give 40 meq of kclor to maintain potassium at goal  EKG abnormality: Atrial flutter vs NSR with significant PACs (2/3)  FEN/GI: Regular diet PPx: Lovenox  Dispo: Inpatient psychiatric hospitalization, once medically cleared, Please IVC if attempts to leave   Subjective:  Patient more linear upon questioning this morning, however still disorganized and tangential. Moments of hype religiosity.   Objective: Temp:  [97.5 F (36.4 C)-98 F (36.7 C)] 97.5 F (36.4 C) (02/05 0548) Pulse Rate:  [57-71] 57 (02/05 0548) Resp:  [18-19] 18 (02/05 0548) BP: (124-143)/(70-85) 128/76 (02/05 0548) SpO2:  [98 %-100 %] 100 % (02/05 0548) Physical Exam: General: AAOx4 in NAD Cardiovascular: RRR. 2/6 systolic murmur in RUSB. Respiratory: CTAB. Normal  WOB on RA.  Abdomen: Soft, non-tender, non-distended. Normoactive bowel sounds. Extremities: No BLE edema.  Laboratory: Most recent CBC Lab Results  Component Value Date   WBC 5.6 02/27/2023   HGB 14.9 02/27/2023   HCT  46.0 02/27/2023   MCV 89.5 02/27/2023   PLT 237 02/27/2023   Most recent BMP    Latest Ref Rng & Units 03/02/2023    4:55 AM  BMP  Glucose 70 - 99 mg/dL 94   BUN 8 - 23 mg/dL 10   Creatinine 9.38 - 1.24 mg/dL 9.03   Sodium 864 - 854 mmol/L 136   Potassium 3.5 - 5.1 mmol/L 3.6   Chloride 98 - 111 mmol/L 104   CO2 22 - 32 mmol/L 24   Calcium  8.9 - 10.3 mg/dL 9.1     Other pertinent labs  RPR negative   Urine Culture 100,000 E Coli    Imaging/Diagnostic Tests: No new imaging  Lenard Calin, MD 03/02/2023, 7:59 AM  PGY-1, [facility] FPTS Intern pager: [phone], text pages welcome Secure chat group [facility] Teaching Service

## 2023-03-02 NOTE — TOC Progression Note (Signed)
 Transition of Care [facility]) - Progression Note    Patient Details  Name: [name] MRN: [id number] Date of Birth: 1940/11/23  [facility] CM/SW Contact  Roxie KANDICE Stain, RN Phone Number: 03/02/2023, 8:35 AM  Clinical Narrative:    This RNCM was notified by April with [facility] that patient is followed by Dr. Cesario and SW is Kaitlyn [phone] ext [phone].    Expected Discharge Plan: Psychiatric Hospital Barriers to Discharge: Psych Bed not available  Expected Discharge Plan and Services In-house Referral: Clinical Social Work     Living arrangements for the past 2 months: Single Family Home, Homeless Shelter                                       Social Determinants of Health (SDOH) Interventions SDOH Screenings   Food Insecurity: Food Insecurity Present (02/28/2023)  Housing: High Risk (02/28/2023)  Transportation Needs: Patient Unable To Answer (02/28/2023)  Utilities: Not At Risk (02/28/2023)  Social Connections: Patient Declined (02/28/2023)  Tobacco Use: Medium Risk (02/27/2023)    Readmission Risk Interventions     No data to display

## 2023-03-02 NOTE — Plan of Care (Signed)
  Problem: Health Behavior/Discharge Planning: Goal: Ability to manage health-related needs will improve Outcome: Progressing   Problem: Clinical Measurements: Goal: Ability to maintain clinical measurements within normal limits will improve Outcome: Progressing Goal: Will remain free from infection Outcome: Progressing Goal: Cardiovascular complication will be avoided Outcome: Progressing   Problem: Activity: Goal: Risk for activity intolerance will decrease Outcome: Progressing   Problem: Nutrition: Goal: Adequate nutrition will be maintained Outcome: Progressing   

## 2023-03-02 NOTE — Assessment & Plan Note (Addendum)
 Systolic murmur noted in RUSB. ECHO demonstrated EF 60-65%, with mild valvular disease.Elevated right-sided heart pressures, likely due to some left hypertrophy and underlying lung disease. He has significant scrotal edema this and will continue diuresis with lasix . - Continue Lasix  20 mg  - BMP WNL Cr. 0.96, K 3.6 and Na 136 - Mag 1.8  - CTM with BMP and Mg - Replace lytes as needed

## 2023-03-02 NOTE — Plan of Care (Signed)
   Problem: Education: Goal: Knowledge of General Education information will improve Description: Including pain rating scale, medication(s)/side effects and non-pharmacologic comfort measures Outcome: Not Progressing   Problem: Health Behavior/Discharge Planning: Goal: Ability to manage health-related needs will improve Outcome: Not Progressing

## 2023-03-02 NOTE — Assessment & Plan Note (Signed)
Upon admission patient had difficulty rising from a seated position. CT head w/o contrast negative. B12 elevated 1,942.  - Thiamine level pending  - Thiamine 100 mg daily - If changes/worsens will consider MRI wo contrast

## 2023-03-02 NOTE — TOC Progression Note (Signed)
 Transition of Care [facility]) - Progression Note    Patient Details  Name: [name] MRN: [id number] Date of Birth: 12-04-1940  Transition of Care [facility]) CM/SW Contact  Lauraine FORBES Saa, LCSW Phone Number: 03/02/2023, 8:28 AM  Clinical Narrative:     8:28 AM Per chart review, patient voluntarily committed and is expected to transfer to [facility] IP Psychiatry unit.  Expected Discharge Plan: Psychiatric Hospital Barriers to Discharge: Psych Bed not available  Expected Discharge Plan and Services In-house Referral: Clinical Social Work     Living arrangements for the past 2 months: Single Family Home, Homeless Shelter                                       Social Determinants of Health (SDOH) Interventions SDOH Screenings   Food Insecurity: Food Insecurity Present (02/28/2023)  Housing: High Risk (02/28/2023)  Transportation Needs: Patient Unable To Answer (02/28/2023)  Utilities: Not At Risk (02/28/2023)  Social Connections: Patient Declined (02/28/2023)  Tobacco Use: Medium Risk (02/27/2023)    Readmission Risk Interventions     No data to display

## 2023-03-02 NOTE — Assessment & Plan Note (Signed)
 Patient has a chronic history of Hydrocele, Per EMR patient been complaining about scrotal edema for several years. US  Scrotum for 07/15/2014 noted bilateral hydrocele L>R.  - Repeat US  Scrotum today   Chronic and Stable Problems:  BPH: Continue home Flomax  0.4 mg nightly HTN: Normotensive and improved after starting Amlodipine . Continue Amlodipine  5 mg to optimize BP control.  Hyperkalemia: (resolved on 2/3). K 3.6. CTM BMP while on lasix . Will give 40 meq of kclor to maintain potassium at goal  EKG abnormality: Atrial flutter vs NSR with significant PACs (2/3)  FEN/GI: Regular diet PPx: Lovenox  Dispo: Inpatient psychiatric hospitalization, once medically cleared, Please IVC if attempts to leave

## 2023-03-02 NOTE — Progress Notes (Signed)
 Mobility Specialist Progress Note:   03/02/23 1106  Mobility  Activity Ambulated with assistance in hallway  Level of Assistance Contact guard assist, steadying assist  Assistive Device None  Distance Ambulated (ft) 120 ft  Activity Response Tolerated well  Mobility Referral Yes  Mobility visit 1 Mobility  Mobility Specialist Start Time (ACUTE ONLY) 1035  Mobility Specialist Stop Time (ACUTE ONLY) 1045  Mobility Specialist Time Calculation (min) (ACUTE ONLY) 10 min   Pt received in bed, agreeable to mobility. C/o slight chest pain during ambulation, otherwise asx throughout. Pt displayed improved gait steadiness compared to yesterday's walk. Pt returned to bed with call bell in reach and all needs met.   Brown Husband  Mobility Specialist Please contact via [employer] office at [phone]

## 2023-03-02 NOTE — Assessment & Plan Note (Addendum)
 AAOx4 , improved linearity, however tangential speech and continues to be somewhat disorganized. Hyper-religious.   - Psych c/s: recs continue haldol  15 mg at bedtime and recommend transitioning back to LAI, recommend inpatient psych once medically clear, please IVC if attempts to leave hospital.  - [facility] consult for inpatient psych placement  - Psych working on potential VA placement in [place]  - Haldol  5 mg q8 PRN for agitation

## 2023-03-02 NOTE — Assessment & Plan Note (Addendum)
 Nursing reported patient had difficulty with swallowing early during admission. SLP evaluated, continue with current diet in place. Patient eating well without issues this morning

## 2023-03-03 DIAGNOSIS — N179 Acute kidney failure, unspecified: Secondary | ICD-10-CM | POA: Diagnosis not present

## 2023-03-03 DIAGNOSIS — F250 Schizoaffective disorder, bipolar type: Secondary | ICD-10-CM | POA: Diagnosis not present

## 2023-03-03 LAB — BASIC METABOLIC PANEL
Anion gap: 10 (ref 5–15)
BUN: 14 mg/dL (ref 8–23)
CO2: 22 mmol/L (ref 22–32)
Calcium: 9.5 mg/dL (ref 8.9–10.3)
Chloride: 105 mmol/L (ref 98–111)
Creatinine, Ser: 1.08 mg/dL (ref 0.61–1.24)
GFR, Estimated: >60 mL/min (ref >60)
Glucose, Bld: 90 mg/dL (ref 70–99)
Potassium: 4.1 mmol/L (ref 3.5–5.1)
Sodium: 137 mmol/L (ref 135–145)

## 2023-03-03 LAB — VITAMIN B1: Vitamin B1 (Thiamine): 101.8 nmol/L (ref 66.5–200.0)

## 2023-03-03 LAB — MAGNESIUM: Magnesium: 2 mg/dL (ref 1.7–2.4)

## 2023-03-03 NOTE — Assessment & Plan Note (Signed)
 Upon admission patient had difficulty rising from a seated position. CT head w/o contrast negative. B12 elevated 1,942.  - Thiamine level pending  - Thiamine 100 mg daily - If changes/worsens will consider MRI wo contrast

## 2023-03-03 NOTE — Assessment & Plan Note (Addendum)
 Systolic murmur noted in RUSB. ECHO demonstrated EF 60-65%, with mild valvular disease.Elevated right-sided heart pressures, likely due to some left hypertrophy and underlying lung disease. He has significant scrotal edema and some lower extremity edema while sitting. Will continue diuresis with lasix  today, given improvement in lower extremity edema and discontinue tomorrow. - Lasix  20 mg today  - BMP WNL Cr. 1.08, K 4.0 - Mag 2.0  - CTM with BMP and Mg, while diuresing  - Replace lytes as needed

## 2023-03-03 NOTE — Plan of Care (Signed)
 Patient alert/oriented X3. Patient does not recall the reason why he came into the hospital. Patient compliant with medication administration and hospital security was called to search the room after MD found bullets on the floor. Security did not find any other contraband and bullets confiscated by security. Patient was up in chair for a few hours. Patient has no complaints at this time, VSS,   Problem: Education: Goal: Knowledge of General Education information will improve Description: Including pain rating scale, medication(s)/side effects and non-pharmacologic comfort measures Outcome: Progressing   Problem: Health Behavior/Discharge Planning: Goal: Ability to manage health-related needs will improve Outcome: Progressing   Problem: Clinical Measurements: Goal: Ability to maintain clinical measurements within normal limits will improve Outcome: Progressing   Problem: Clinical Measurements: Goal: Will remain free from infection Outcome: Progressing   Problem: Clinical Measurements: Goal: Diagnostic test results will improve Outcome: Progressing   Problem: Clinical Measurements: Goal: Respiratory complications will improve Outcome: Progressing   Problem: Clinical Measurements: Goal: Cardiovascular complication will be avoided Outcome: Progressing   Problem: Activity: Goal: Risk for activity intolerance will decrease Outcome: Progressing   Problem: Nutrition: Goal: Adequate nutrition will be maintained Outcome: Progressing   Problem: Coping: Goal: Level of anxiety will decrease Outcome: Progressing   Problem: Elimination: Goal: Will not experience complications related to bowel motility Outcome: Progressing   Problem: Elimination: Goal: Will not experience complications related to urinary retention Outcome: Progressing   Problem: Pain Managment: Goal: General experience of comfort will improve and/or be controlled Outcome: Progressing   Problem: Safety: Goal:  Ability to remain free from injury will improve Outcome: Progressing   Problem: Skin Integrity: Goal: Risk for impaired skin integrity will decrease Outcome: Progressing

## 2023-03-03 NOTE — Plan of Care (Signed)

## 2023-03-03 NOTE — Consult Note (Signed)
 [facility] Follow-up  Patient Name: .[name]  MRN: [id number]  DOB: [birth year]  Consult Order details:  Orders (From admission, onward)     Start     Ordered   02/28/23 1139  IP CONSULT TO PSYCHIATRY       Comments: He can be seen tomorrow  Ordering Provider: Lenard Calin, MD  Provider:  (Not yet assigned)  Question Answer  Location [facility]  Reason for Consult? 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 male admitted: Medically for 02/27/2023  9:59 AM for acute kidney injury, altered mental status. He carries the psychiatric diagnoses of schizophrenia and has a past medical history of protein-calorie malnutrition, neurosyphilis, hearing loss, hepatitis C, anemia, BPH, and repeated urinary tract infections in a male.   His current presentation of altered mental status is most consistent with schizoaffective disorder with medication non-adherence. He meets criteria for schizophrenia based on historical chart data, current disorganization.  Current outpatient psychotropic medications include haldol  10 mg QHS and historically he has had a fair response to these medications. He was not compliant with medications prior to admission as evidenced by current disorganization and patient report. We have increased his nightly dosage while inpatient to haldol  15 mg QHS. Please see plan below for detailed recommendations.   Diagnoses:  Active Hospital problems: Active Problems:   Urinary tract infection   Schizoaffective disorder, bipolar type (HCC)   Murmur   Acute cystitis without hematuria   Ataxia   Protein-calorie  malnutrition, severe   Dysphagia   AKI (acute kidney injury) (HCC)   Hydrocele    Plan   ## Psychiatric Medication Recommendations:  -- Continue haloperidol  15 mg nightly -- Once patient stabilizes on a dose of haloperidol , recommend transitioning him back to haloperidol  decanoate.  ## Medical Decision Making Capacity: Not specifically addressed in this encounter  ## Further Work-up:  While pt on Qtc prolonging medications, please monitor & replete K+ to 4 and Mg2+ to 2 -- most recent EKG on 2/3 had QtC of 407 -- Pertinent labwork reviewed earlier this admission includes: TSH (WNL), B12 (1942-H), UA (consistent with UTI), improving GFR   ## Disposition:-- We recommend inpatient psychiatric hospitalization when medically cleared. Patient is under voluntary admission status at this time; please IVC if attempts to leave hospital. Patient was presented at the [facility]. He will be reviewed by their staff for admission. He said that he would prefer the [facility] hospital in [place] or [place], but they have no beds.   ## Behavioral / Environmental: -Patient would benefit from more frequent contact with medical team to delineate plan of care and allow for clarification questions, which will help alleviate anxiety regarding treatment. If possible, try to check back in with the pt in the afternoon.    ## Safety and Observation Level:  - Based on my clinical evaluation, I estimate the patient to be at low risk of self harm in the current setting. - At this time, we recommend  routine. This decision is based on my review of the chart including patient's history  and current presentation, interview of the patient, mental status examination, and consideration of suicide risk including evaluating suicidal ideation, plan, intent, suicidal or self-harm behaviors, risk factors, and protective factors. This judgment is based on our ability to directly address suicide risk, implement suicide prevention  strategies, and develop a safety plan while the patient is in the clinical setting. Please contact our team if there is a concern that risk level has changed.  CSSR Risk Category:C-SSRS RISK CATEGORY: No Risk  Suicide Risk Assessment: Patient has following modifiable risk factors for suicide: under treated depression , recklessness, and medication noncompliance, which we are addressing by restarting medication, recommending inpatient psychiatric stay once medically stabilized. Patient has following non-modifiable or demographic risk factors for suicide: male gender, history of self harm behavior, and psychiatric hospitalization Patient has the following protective factors against suicide: Supportive friends, Cultural, spiritual, or religious beliefs that discourage suicide, and Frustration tolerance  Thank you for this consult request. Recommendations have been communicated to the primary team.  We will follow at this time.   Lynwood Morene Lavone Delsie, MD       History of Present Illness  Relevant Aspects of [facility] Course:  Admitted on 02/27/2023 for feeling faint, AMS; found to have AKI and acute cystitis without hematuria.   Patient Report:  Met patient bedside this AM. Patient was alert and oriented to person, place, interviewer, and situation. Not oriented to date.  When the notewriter arrived, the patient was being interviewed by Speech Language Pathology prior to attempting a swallow study. Joined to facilitate communication, which appeared to be going poorly due to patient disorientation.  When walking around the room, noticed three live .22 bullets on the ground in the patient's room.   Brought these bullets to the nursing station and notified security. Security came and conducted a thorough search of the patient's belongings. No other contraband found.  Patient was paranoid and guarded with respect to the bullets: I would like to hear a good explanation for why YOU  found them on my floor. Explain why you found them, I'd like to hear your account of why you think they're there.  Patient voiced that he had found two bullets placed by someone else at his sister's house shortly before noticing the lamp bearing the face of jesus christ. Then he reported that someone placed a third bullet in his room at home. Patient tried to imply that the notewriter had planted the bullets. Fortunately speech language pathology was present and can verify the events as above.  He denied SI/HI/AVH.   Psych ROS:  Depression: Reports good sleep,  Anxiety: Denies significant symptoms of worry except regarding his various medical problems. Mania (lifetime and current): Patient denies a history of mania. Unable to remember how much he's been sleeping lately.  Psychosis: (lifetime and current): Patient reports that he has been psychotic before. Could not provide further details.  Collateral information:  Jhonny Essie Shuck (Sister) [phone] ([place]) on 03/01/2023 at 12:10 pm. No answer.  Review of Systems  Constitutional:  Negative for chills, fever, malaise/fatigue and weight loss.  HENT:  Positive for hearing loss.   Eyes:  Positive for blurred vision.  Respiratory:  Negative for cough.   Cardiovascular:  Negative for chest pain.  Neurological:  Positive for headaches. Negative for tingling.  Psychiatric/Behavioral:  Positive for depression. Negative for hallucinations, memory loss, substance abuse and suicidal ideas. The patient is nervous/anxious. The patient does not have insomnia.      Psychiatric and  Social History  Psychiatric History:  Information collected from chart  Prev Dx/Sx: schizophrenia vs schizoaffective disorder, bipolar type Current Psych Provider: [facility] Home Meds (current): haloperidol  10 mg at bedtime (non compliant) and haloperidol  decanoate, most recent injection 01/18/23 Previous Med Trials: ziprasidone , risperidone , ativan ,  zolplidem Therapy: not currently  Prior Psych Hospitalization: last 12/2022, possible others at [facility]. Prior Self Harm:  Prior Violence: reports false imprisonment for armed robbery in past  Family Psych History: Patient not able to confirm Family Hx suicide: Patient not able to confirm  Social History:  Developmental Hx: Patient not able to confirm Educational Hx: Patient not able to confirm Occupational Hx: Patient not able to confirm Legal Hx: wrongful arrest for armed robbery Not recent. Not pending. Living Situation: homeless, but leaves thing at his sister's house Spiritual Hx: Emergency Planning/management Officer Hx: [employer] during [place] [employer] Access to weapons/lethal means: denies   Substance History Alcohol: does not drink, pt too disorganized to complete review of substances. Tobacco: 06/11/1955 - 06/11/1975; Smoked an average of 1 pack/day for 20.0 years  Illicit drugs: per chart, this does not appear to be a significant problem for him. Prescription drug abuse: denies Rehab hx: denies  Exam Findings  Physical Exam: Pt is disheveled, wearing a hospital gown, but he kept adjusting the gown today to keep himself covered appropriately. He is unshaven, but was cleaner. He has numerous scars on his body and pen marks are present from the two pens clutched in his left hand. He is not agitated, but he does speak very loudly as a result of his hearing impairment. Today (2/6), he was a bit more disorganized and paranoid than previous days, particularly after the discovery of the bullets.  Vital Signs:  Temp:  [97.7 F (36.5 C)-98 F (36.7 C)] 98 F (36.7 C) (02/06 0700) Pulse Rate:  [63-82] 82 (02/06 0700) Resp:  [17-18] 17 (02/06 0700) BP: (101-138)/(64-78) 117/76 (02/06 0700) SpO2:  [95 %-100 %] 95 % (02/06 0700) Blood pressure 117/76, pulse 82, temperature 98 F (36.7 C), temperature source Oral, resp. rate 17, height 5' 8 (1.727 m), weight 80 kg, SpO2 95%. Body mass index is 26.82  kg/m.  Physical Exam Vitals and nursing note reviewed.  Constitutional:      General: He is not in acute distress.    Appearance: He is ill-appearing.  HENT:     Head: Normocephalic and atraumatic.  Skin:    General: Skin is warm and dry.  Psychiatric:        Attention and Perception: Perception normal. He is inattentive.        Mood and Affect: Mood is depressed. Affect is inappropriate.        Speech: Speech is rapid and pressured and tangential.        Behavior: Behavior is hyperactive. Behavior is cooperative.        Thought Content: Thought content is paranoid and delusional. Thought content does not include homicidal or suicidal ideation.        Cognition and Memory: Cognition is impaired. Memory is impaired. He exhibits impaired recent memory.        Judgment: Judgment is impulsive.     Mental Status Exam: General Appearance: Disheveled  Orientation:  Other:  Place and person, not day or situation  Memory:  Immediate;   Fair Recent;   Poor Remote;   Fair  Concentration:  Concentration: Fair and Attention Span: Fair  Recall:  Poor  Attention  Fair  Eye Contact:  Good  Speech:  Normal Rate  Language:  Fair Pt uses a number of words incorrectly  Volume:  Increased (hard of hearing)  Mood: Worried about them bullets  Affect:  Congruent  Thought Process:  Disorganized   Thought Content:  Illogical and Tangential  Suicidal Thoughts:  No  Homicidal Thoughts:  No  Judgement:  Poor  Insight:  Lacking  Psychomotor Activity:  Normal  Akathisia:  No  Fund of Knowledge:  Poor      Assets:  Manufacturing Systems Engineer Resilience Vocational/Educational  Cognition:  Impaired,  Moderate  ADL's:  Impaired  AIMS (if indicated):        Other History   These have been pulled in through the EMR, reviewed, and updated if appropriate.  Family History:  The patient's family history is not on file.  Medical History: Past Medical History:  Diagnosis Date  . AKI (acute kidney  injury) (HCC) 02/27/2023  . AMS (altered mental status) 01/06/2023  . Anemia 08/31/2012  . Benign hypertrophy of prostate   . Hepatitis C   . Hypokalemia 02/06/2013  . Schizophrenia (HCC)   . Swelling of lower extremity 01/13/2023  . Urinary tract infection   . UTI (urinary tract infection) 06/13/2011   IMO SNOMED Dx Update Oct 2024      Surgical History: Past Surgical History:  Procedure Laterality Date  . KNEE SURGERY     right     Medications:   Current Facility-Administered Medications:  .  acetaminophen  (TYLENOL ) tablet 650 mg, 650 mg, Oral, Q6H PRN **OR** acetaminophen  (TYLENOL ) suppository 650 mg, 650 mg, Rectal, Q6H PRN, Elicia Hamlet, MD .  amLODipine  (NORVASC ) tablet 5 mg, 5 mg, Oral, Daily, Cleotilde Lukes, DO, 5 mg at 03/03/23 9070 .  cefadroxil  (DURICEF) capsule 500 mg, 500 mg, Oral, BID, Miller, Samantha, DO, 500 mg at 03/03/23 9070 .  enoxaparin  (LOVENOX ) injection 40 mg, 40 mg, Subcutaneous, Daily, Zheng, Jacky, MD, 40 mg at 03/02/23 2127 .  feeding supplement (ENSURE ENLIVE / ENSURE PLUS) liquid 237 mL, 237 mL, Oral, BID BM, Delores Suzann HERO, MD, 237 mL at 03/03/23 0936 .  furosemide  (LASIX ) tablet 20 mg, 20 mg, Oral, Daily, Delores Suzann HERO, MD, 20 mg at 03/03/23 9070 .  haloperidol  (HALDOL ) tablet 15 mg, 15 mg, Oral, QHS, Delsie Lynwood Morene Lavone, MD, 15 mg at 03/02/23 2125 .  haloperidol  (HALDOL ) tablet 5 mg, 5 mg, Oral, Q8H PRN, Lenard Calin, MD .  multivitamin with minerals tablet 1 tablet, 1 tablet, Oral, Daily, Delores Suzann HERO, MD, 1 tablet at 03/03/23 9070 .  tamsulosin  (FLOMAX ) capsule 0.4 mg, 0.4 mg, Oral, Daily, Zheng, Jacky, MD, 0.4 mg at 03/03/23 9070 .  thiamine  (VITAMIN B1) tablet 100 mg, 100 mg, Oral, Daily, Lenard Calin, MD, 100 mg at 03/03/23 9070  Allergies: No Known Allergies  Lynwood Morene Lavone Delsie, MD

## 2023-03-03 NOTE — Assessment & Plan Note (Signed)
 UA positive for leukocytes and nitrites. S/p CTX in ED. Endorses increased urinary urgency. Denies any burning or hematuria. Urine culture positive for E Coli and will continue on antibiotics.   - Continue Cefadroxil  500 mg BID (2/2-2/7), will complete course tomorrow  - Urine culture positive w/ 100,000 E Coli

## 2023-03-03 NOTE — Assessment & Plan Note (Addendum)
 AAOx4 , improved linearity, however tangential speech and continues to be somewhat disorganized. Patient ruminating about nursing staff attempting to take temperature and is disgruntled.    - Psych c/s: recs continue haldol  15 mg at bedtime and recommend transitioning back to LAI, recommend inpatient psych once medically clear, please IVC if attempts to leave hospital.  - [facility] consult for inpatient psych placement  - Psych working on potential [facility] placement in [place]  - Haldol  5 mg q8 PRN for agitation

## 2023-03-03 NOTE — Assessment & Plan Note (Signed)
 Nursing reported patient had difficulty with swallowing early during admission. SLP evaluated, continue with current diet in place. Patient continues to eat well and without difficulty

## 2023-03-03 NOTE — Progress Notes (Signed)
 Mobility Specialist Progress Note:   03/03/23 1329  Mobility  Activity Ambulated with assistance in hallway  Level of Assistance Contact guard assist, steadying assist  Assistive Device None  Distance Ambulated (ft) 150 ft  Activity Response Tolerated well  Mobility Referral Yes  Mobility visit 1 Mobility  Mobility Specialist Start Time (ACUTE ONLY) 1111  Mobility Specialist Stop Time (ACUTE ONLY) 1122  Mobility Specialist Time Calculation (min) (ACUTE ONLY) 11 min   Pt received in bed, agreeable to mobility. Pt denied any discomfort during ambulation, asx throughout. Pt returned to bed with call bell in reach and all needs met.   Brown Husband  Mobility Specialist Please contact via [employer] office at [phone]

## 2023-03-03 NOTE — Progress Notes (Addendum)
 Daily Progress Note Intern Pager: [phone]  Patient name: [name] Medical record number: [id number] Date of birth: Apr 28, 1940 Age: 83 y.o. Gender: male  Primary Care Provider: [facility] Consultants: Psychiatry  Code Status: Full Code   Pt Overview and Major Events to Date:  2/2: Admitted 2/3: Psych consult placed  2/4 : Psych recs increased Haldol  15 mg at bedtime  2/5 : US  Scrotum w/ L complex Hydrocele, chronic issue    Assessment and Plan:   [name] is a 83 y.o. male with a past medical history of schizoaffective disorder, bipolar type noncompliant on home meds, HTN, hepatitis C, hearing loss, BPH and housing insecurity presenting with dizzy spells, gait ataxia, and disorganized thinking and admitted for AKI (now resolved) and UTI.  Patient is medically stable for discharge and awaiting psych bed availability.  Assessment & Plan Urinary tract infection UA positive for leukocytes and nitrites. S/p CTX in ED. Endorses increased urinary urgency. Denies any burning or hematuria. Urine culture positive for E Coli and will continue on antibiotics.   - Continue Cefadroxil  500 mg BID (2/2-2/7), will complete course tomorrow  - Urine culture positive w/ 100,000 E Coli  Schizoaffective disorder, bipolar type (HCC) AAOx4 , improved linearity, however tangential speech and continues to be somewhat disorganized. Patient ruminating about nursing staff attempting to take temperature and is disgruntled.    - Psych c/s: recs continue haldol  15 mg at bedtime and recommend transitioning back to LAI, recommend inpatient psych once medically clear, please IVC if attempts to leave hospital.  - [facility] consult for inpatient psych placement  - Psych working on potential [facility] placement in [place]  - Haldol  5 mg q8 PRN for agitation Murmur Systolic murmur noted in RUSB. ECHO demonstrated EF 60-65%, with mild valvular disease.Elevated right-sided heart pressures, likely due to  some left hypertrophy and underlying lung disease. He has significant scrotal edema and some lower extremity edema while sitting. Will continue diuresis with lasix  today, given improvement in lower extremity edema and discontinue tomorrow. - Lasix  20 mg today  - BMP WNL Cr. 1.08, K 4.0 - Mag 2.0  - CTM with BMP and Mg, while diuresing  - Replace lytes as needed   Dysphagia Nursing reported patient had difficulty with swallowing early during admission. SLP evaluated, continue with current diet in place. Patient continues to eat well and without difficulty Ataxia Upon admission patient had difficulty rising from a seated position. CT head w/o contrast negative. B12 elevated 1,942.  - Thiamine  level pending  - Thiamine  100 mg daily - If changes/worsens will consider MRI wo contrast Hydrocele Patient has a chronic history of Hydrocele, Per EMR patient been complaining about scrotal edema for several years. US  Scrotum for 07/15/2014 noted bilateral hydrocele L>R. Denies pain.  - 2/5 US  Scrotum consistent w/ complex L hydrocele  - CTM   Chronic and Stable Problems:  BPH: Continue home Flomax  0.4 mg nightly HTN: Normotensive and improved after starting Amlodipine . Continue Amlodipine  5 mg to optimize BP control.  Hyperkalemia: (resolved on 2/3). K 4.0. CTM BMP while on lasix . EKG abnormality: Atrial flutter vs NSR with significant PACs (2/3)  FEN/GI: Regular diet PPx: Lovenox  Dispo: Inpatient psychiatric hospitalization, once medically cleared, Please IVC if attempts to leave   Subjective:  Patient complains of urgency. Still disorganized. Bullets found in his room per psychiatry, pictures in chart media.   Objective: Temp:  [97.7 F (36.5 C)-98 F (36.7 C)] 97.9 F (36.6 C) (02/06  0454) Pulse Rate:  [63-81] 81 (02/06 0454) Resp:  [18] 18 (02/06 0454) BP: (101-138)/(64-87) 138/78 (02/06 0454) SpO2:  [99 %-100 %] 100 % (02/06 0454)  Physical Exam: General: AAOx4 in  NAD Cardiovascular: RRR. 2/6 systolic murmur in RUSB. Respiratory: CTAB. Normal WOB on RA.  Abdomen: Soft, non-tender, non-distended. Normoactive bowel sounds. GU: Scrotal swelling L > R  Extremities: Trace LE edema    Laboratory: Most recent CBC Lab Results  Component Value Date   WBC 5.6 02/27/2023   HGB 14.9 02/27/2023   HCT 46.0 02/27/2023   MCV 89.5 02/27/2023   PLT 237 02/27/2023   Most recent BMP    Latest Ref Rng & Units 03/03/2023    4:25 AM  BMP  Glucose 70 - 99 mg/dL 90   BUN 8 - 23 mg/dL 14   Creatinine 9.38 - 1.24 mg/dL 8.91   Sodium 864 - 854 mmol/L 137   Potassium 3.5 - 5.1 mmol/L 4.1   Chloride 98 - 111 mmol/L 105   CO2 22 - 32 mmol/L 22   Calcium  8.9 - 10.3 mg/dL 9.5    Other pertinent labs   2/2 Urine Culture 100,000 E Coli  Mag 2.0    Imaging/Diagnostic Tests: US  of Scrotum   Radiologist Impression: 1. Complex left hydrocele, unchanged. 2. Nonvisualization of the left testicle and epididymis, as before  Lenard Calin, MD 03/03/2023, 7:18 AM  PGY-1, [facility] FPTS Intern pager: [phone], text pages welcome Secure chat group [facility] Teaching Service

## 2023-03-03 NOTE — Assessment & Plan Note (Signed)
 Patient has a chronic history of Hydrocele, Per EMR patient been complaining about scrotal edema for several years. US  Scrotum for 07/15/2014 noted bilateral hydrocele L>R. Denies pain.  - 2/5 US  Scrotum consistent w/ complex L hydrocele  - CTM   Chronic and Stable Problems:  BPH: Continue home Flomax  0.4 mg nightly HTN: Normotensive and improved after starting Amlodipine . Continue Amlodipine  5 mg to optimize BP control.  Hyperkalemia: (resolved on 2/3). K 4.0. CTM BMP while on lasix . EKG abnormality: Atrial flutter vs NSR with significant PACs (2/3)  FEN/GI: Regular diet PPx: Lovenox  Dispo: Inpatient psychiatric hospitalization, once medically cleared, Please IVC if attempts to leave
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
## 2023-03-03 NOTE — TOC Progression Note (Addendum)
 Transition of Care [facility]) - Progression Note    Patient Details  Name: [name] MRN: [id number] Date of Birth: 02-Mar-1940  Transition of Care [facility]) CM/SW Contact  Lauraine FORBES Saa, LCSW Phone Number: 03/03/2023, 9:13 AM  Clinical Narrative:     9:13 AM CSW called [facility] who informed that they do not currently have inpatient psychiatry beds at this time.  3:05 PM [facility] consulted for admission review by Psychiatry Resident.  Expected Discharge Plan: Psychiatric Hospital Barriers to Discharge: Psych Bed not available  Expected Discharge Plan and Services In-house Referral: Clinical Social Work     Living arrangements for the past 2 months: Single Family Home, Homeless Shelter                                       Social Determinants of Health (SDOH) Interventions SDOH Screenings   Food Insecurity: Food Insecurity Present (02/28/2023)  Housing: High Risk (02/28/2023)  Transportation Needs: Patient Unable To Answer (02/28/2023)  Utilities: Not At Risk (02/28/2023)  Social Connections: Patient Declined (02/28/2023)  Tobacco Use: Medium Risk (02/27/2023)    Readmission Risk Interventions     No data to display

## 2023-03-04 ENCOUNTER — Encounter (HOSPITAL_COMMUNITY): Payer: Self-pay | Admitting: Family Medicine

## 2023-03-04 DIAGNOSIS — N179 Acute kidney failure, unspecified: Secondary | ICD-10-CM | POA: Diagnosis not present

## 2023-03-04 DIAGNOSIS — F250 Schizoaffective disorder, bipolar type: Secondary | ICD-10-CM | POA: Diagnosis not present

## 2023-03-04 LAB — BASIC METABOLIC PANEL
Anion gap: 9 (ref 5–15)
BUN: 22 mg/dL (ref 8–23)
CO2: 23 mmol/L (ref 22–32)
Calcium: 9.2 mg/dL (ref 8.9–10.3)
Chloride: 105 mmol/L (ref 98–111)
Creatinine, Ser: 1.2 mg/dL (ref 0.61–1.24)
GFR, Estimated: >60 mL/min (ref >60)
Glucose, Bld: 98 mg/dL (ref 70–99)
Potassium: 4.2 mmol/L (ref 3.5–5.1)
Sodium: 137 mmol/L (ref 135–145)

## 2023-03-04 LAB — MAGNESIUM: Magnesium: 1.8 mg/dL (ref 1.7–2.4)

## 2023-03-04 MED ORDER — CAPSAICIN 0.075 % EX CREA
TOPICAL_CREAM | Freq: Two times a day (BID) | CUTANEOUS | Status: DC
  Filled 2023-03-04: qty 57

## 2023-03-04 MED ORDER — HALOPERIDOL 5 MG PO TABS
20.0000 mg | ORAL_TABLET | Freq: Every day | ORAL | Status: DC
  Administered 2023-03-04: 20 mg via ORAL
  Filled 2023-03-04 (×2): qty 4

## 2023-03-04 MED ORDER — MAGNESIUM SULFATE 2 GM/50ML IV SOLN
2.0000 g | Freq: Once | INTRAVENOUS | Status: DC
  Filled 2023-03-04: qty 50

## 2023-03-04 NOTE — Assessment & Plan Note (Signed)
 UA positive for leukocytes and nitrites. S/p CTX in ED. Endorses increased urinary frequency.  - Cefadroxil 500 mg BID (2/2-2/7) - Follow up urine culture
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
## 2023-03-04 NOTE — Progress Notes (Addendum)
 Mobility Specialist Progress Note:   03/04/23 1228  Mobility  Activity Ambulated with assistance in hallway  Level of Assistance Contact guard assist, steadying assist  Assistive Device None  Distance Ambulated (ft) 160 ft  Activity Response Tolerated well  Mobility Referral Yes  Mobility visit 1 Mobility  Mobility Specialist Start Time (ACUTE ONLY) 1120  Mobility Specialist Stop Time (ACUTE ONLY) 1130  Mobility Specialist Time Calculation (min) (ACUTE ONLY) 10 min   Pt received in chair, agreeable to mobility. C/o R leg pain during ambulation. Pt continuously mentioned message therapy throughout session needing MaxA VC for redirection. Pt left in chair with call bell in reach and all needs met.  Brown Husband  Mobility Specialist Please contact via [employer] office at [phone]

## 2023-03-04 NOTE — Progress Notes (Signed)
 Nutrition Follow-up  DOCUMENTATION CODES:   Severe malnutrition in context of social or environmental circumstances  INTERVENTION:  Continue with current diet. Continue with Ensure Plus High Protein po BID, each supplement provides 350 kcal and 20 grams of protein. Implement Multivitamin/minerals   NUTRITION DIAGNOSIS:   Severe Malnutrition related to social / environmental circumstances as evidenced by moderate fat depletion, severe muscle depletion.  Ongoing with intervention in place  GOAL:   Patient will meet greater than or equal to 90% of their needs    MONITOR:   PO intake, Supplement acceptance  REASON FOR ASSESSMENT:   Consult Assessment of nutrition requirement/status  ASSESSMENT:   83 y.o. M, Presented to ED with complaints of dizzy spells, gait ataxia, disorganized thinking.  Admitted with AKI. PMH: schizophrenia, Hep C, UTI, Hypokalemia, AMS.  Patient not wanting to talk to RD on this day.  Breakfast tray at bedside with 100% of it consumed. RN reports 100% of all his meals are being consumed.    NUTRITION - FOCUSED PHYSICAL EXAM:  Flowsheet Row Most Recent Value  Orbital Region Moderate depletion  Upper Arm Region Moderate depletion  Thoracic and Lumbar Region Moderate depletion  Buccal Region Moderate depletion  Temple Region Severe depletion  Clavicle Bone Region Severe depletion  Clavicle and Acromion Bone Region Severe depletion  Scapular Bone Region Unable to assess  Anterior Thigh Region No depletion  Posterior Calf Region No depletion  Edema (RD Assessment) None  Hair Reviewed  Eyes Unable to assess  Mouth Reviewed  Skin Reviewed  Nails Reviewed       Diet Order:   Diet Order             Diet regular Room service appropriate? Yes; Fluid consistency: Thin  Diet effective now                   EDUCATION NEEDS:   Not appropriate for education at this time  Skin:  Skin Assessment: Reviewed RN Assessment  Last BM:   PTA  Height:   Ht Readings from Last 1 Encounters:  02/28/23 5' 8 (1.727 m)    Weight:   Wt Readings from Last 1 Encounters:  02/28/23 80 kg    Ideal Body Weight:     BMI:  Body mass index is 26.82 kg/m.  Estimated Nutritional Needs:   Kcal:  2100-2450 kcal  Protein:  95-120 g  Fluid:  57ml/kcal    Jenna Pew RDN, LDN Clinical Dietitian   If unable to reach, please contact RD Inpatient secure chat group between 8 am-4 pm daily

## 2023-03-04 NOTE — Assessment & Plan Note (Signed)
 Nursing reported patient had difficulty with swallowing early during admission. SLP evaluated, continue with current diet in place. Patient continues to eat well and without difficulty

## 2023-03-04 NOTE — Progress Notes (Signed)
 PT Cancellation Note  Patient Details Name: [name] MRN: [id number] DOB: 08/25/1940   Cancelled Treatment:    Reason Eval/Treat Not Completed: Fatigue/lethargy limiting ability to participate, pt asleep at this time, has worked with OT and walked 16ft with mobility specialist this afternoon. RN requested to let patient rest at this time. Will continue to follow and progress PT tx as tolerated.   Izetta Call, PT, DPT   Acute Rehabilitation Department Office [phone] Secure Chat Communication Preferred   Izetta JULIANNA Call 03/04/2023, 2:20 PM

## 2023-03-04 NOTE — Assessment & Plan Note (Addendum)
 Calm and cooperative on exam today, no complaints. - Psych c/s: recs increase haldol  20 mg at bedtime and recommend transitioning back to [facility], recommend inpatient psych once medically clear, please IVC if attempts to leave hospital.  - [facility] consult for inpatient psych placement  - [facility] working on potential [facility] placement in [place]  - Haldol  5 mg q8 PRN for agitation -repeat EKG today and tomorrow due to haldol  increase -AM BMP and Mag

## 2023-03-04 NOTE — Assessment & Plan Note (Signed)
 Will complete course of cefadroxil  this morning. - Continue Cefadroxil  500 mg BID (2/2-2/7), will complete course today

## 2023-03-04 NOTE — Assessment & Plan Note (Signed)
 Thiamine  level returned this a.m., WNL.  Will continue with supplementation.  Suspect ataxia is related to multimodal causes. - Thiamine  100 mg daily - If changes/worsens will consider MRI wo contrast

## 2023-03-04 NOTE — TOC Progression Note (Signed)
 Transition of Care [facility]) - Progression Note    Patient Details  Name: [name] MRN: [id number] Date of Birth: 04/22/1940  [facility] CM/SW Contact  Lauraine FORBES Saa, LCSW Phone Number: 03/04/2023, 1:47 PM  Clinical Narrative:     1:47 PM Per [facility] admissions, RN patient is currently being evaluated for IP Psychiatry admission.   Expected Discharge Plan: Psychiatric Hospital Barriers to Discharge: Psych Bed not available  Expected Discharge Plan and Services In-house Referral: Clinical Social Work     Living arrangements for the past 2 months: Single Family Home, Homeless Shelter                                       Social Determinants of Health (SDOH) Interventions SDOH Screenings   Food Insecurity: Food Insecurity Present (02/28/2023)  Housing: High Risk (02/28/2023)  Transportation Needs: Patient Unable To Answer (02/28/2023)  Utilities: Not At Risk (02/28/2023)  Social Connections: Patient Declined (02/28/2023)  Tobacco Use: Medium Risk (02/27/2023)    Readmission Risk Interventions     No data to display

## 2023-03-04 NOTE — Assessment & Plan Note (Addendum)
 Stable. Labs WNL. Consistent with known aortic stenosis seen on ECHO - Replace lytes as needed

## 2023-03-04 NOTE — Plan of Care (Signed)

## 2023-03-04 NOTE — Progress Notes (Signed)
 Daily Progress Note Intern Pager: [phone]  Patient name: [name] Medical record number: [id number] Date of birth: 12/03/1940 Age: 83 y.o. Gender: male  Primary Care Provider: [facility] Consultants: Psychiatry Code Status: Full  Pt Overview and Major Events to Date:  2/2: Admitted to [facility] TS 2/3: Psych consult placed 2/4: Psych recs creased Haldol  to 15 mg at bedtime 2/5: Ultrasound scrotum with left complex hydrocele, chronic issue  Assessment and Plan:  Mr. [name] is an 83 year old male with a past medical history of schizoaffective disorder, bipolar type noncompliant on home meds, hypertension, hep C, hearing loss, BPH and housing insecurity presenting with dizzy spells, gait ataxia and disorganized thinking and admitted for AKI and UTI which have now resolved.  Patient is medically stable for discharge and awaiting psych bed availability. Assessment & Plan Urinary tract infection (Resolved: 03/04/2023) Will complete course of cefadroxil  this morning. - Continue Cefadroxil  500 mg BID (2/2-2/7), will complete course today Schizoaffective disorder, bipolar type (HCC) Calm and cooperative on exam today, no complaints. - Psych c/s: recs increase haldol  20 mg at bedtime and recommend transitioning back to [facility], recommend inpatient psych once medically clear, please IVC if attempts to leave hospital.  - [facility] consult for inpatient psych placement  - Psych working on potential [facility] placement in [place]  - Haldol  5 mg q8 PRN for agitation -repeat EKG today and tomorrow due to haldol  increase -AM BMP and Mag Murmur Stable.  Labs WNL. Consistent with known aortic stenosis seen on ECHO - Replace lytes as needed  Dysphagia (Resolved: 03/04/2023) Nursing reported patient had difficulty with swallowing early during admission. SLP evaluated, continue with current diet in place. Patient continues to eat well and without difficulty Ataxia Thiamine  level returned this a.m.,  WNL.  Will continue with supplementation.  Suspect ataxia is related to multimodal causes. - Thiamine  100 mg daily - If changes/worsens will consider MRI wo contrast Acute cystitis without hematuria (Resolved: 03/04/2023) UA positive for leukocytes and nitrites. S/p CTX in ED. Endorses increased urinary frequency.  - Cefadroxil  500 mg BID (2/2-2/7) - Follow up urine culture   Chronic and Stable Problems:  Hydrocele: Confirmed continued presence with US  scrotum on 2/5, no changes from prior study BPH: Continue home Flomax  0.4 mg nightly Hypertension: Continue amlodipine  5 mg  FEN/GI: Regular diet PPx: Lovenox  Dispo: Inpatient psychiatric care   pending bed availability .   Subjective:  Calm and cooperative on exam this morning.  Did not discuss delusions with this practitioner.  Objective: Temp:  [97.5 F (36.4 C)-97.8 F (36.6 C)] 97.8 F (36.6 C) (02/07 0700) Pulse Rate:  [71-89] 89 (02/07 0700) Resp:  [17-18] 17 (02/07 0700) BP: (107-137)/(58-108) 123/108 (02/07 0700) SpO2:  [97 %-100 %] 97 % (02/07 0700) Physical Exam: General: Early appearing gentleman, no distress Cardiovascular: 3 of 6 systolic murmur.  RRR Respiratory: CTAB, no increased work of breathing Extremities: 2+ pulses bilateral upper and lower extremities, no pitting edema noted in the bilateral lower extremity.  Laboratory: Most recent CBC Lab Results  Component Value Date   WBC 5.6 02/27/2023   HGB 14.9 02/27/2023   HCT 46.0 02/27/2023   MCV 89.5 02/27/2023   PLT 237 02/27/2023   Most recent BMP    Latest Ref Rng & Units 03/04/2023    1:13 AM  BMP  Glucose 70 - 99 mg/dL 98   BUN 8 - 23 mg/dL 22   Creatinine 9.38 - 1.24 mg/dL 8.79   Sodium 864 -  145 mmol/L 137   Potassium 3.5 - 5.1 mmol/L 4.2   Chloride 98 - 111 mmol/L 105   CO2 22 - 32 mmol/L 23   Calcium  8.9 - 10.3 mg/dL 9.2    Adrian Lukes, DO 03/04/2023, 7:28 AM  PGY-1, [facility] FPTS Intern pager: [phone], text  pages welcome Secure chat group [facility] Teaching Service

## 2023-03-04 NOTE — Consult Note (Signed)
 [name] Follow-up  Patient Name: .[name]  MRN: [id number]  DOB: [birth year]  Consult Order details:  Orders (From admission, onward)     Start     Ordered   02/28/23 1139  IP CONSULT TO PSYCHIATRY       Comments: He can be seen tomorrow  Ordering Provider: Lenard Calin, MD  Provider:  (Not yet assigned)  Question Answer  Location [facility]  Reason for Consult? 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 male admitted: Medically for 02/27/2023  9:59 AM for acute kidney injury, altered mental status. He carries the psychiatric diagnoses of schizophrenia and has a past medical history of protein-calorie malnutrition, neurosyphilis, hearing loss, hepatitis C, anemia, BPH, and repeated urinary tract infections in a male.   His current presentation of altered mental status is most consistent with schizoaffective disorder with medication non-adherence. He meets criteria for schizophrenia based on historical chart data, current disorganization.  Current outpatient psychotropic medications include haldol  10 mg QHS and historically he has had a fair response to these medications. He was not compliant with medications prior to admission as evidenced by current disorganization and patient report. We have increased his nightly dosage while inpatient to haldol  15 mg QHS. Please see plan below for detailed recommendations.   Diagnoses:  Active Hospital problems: Active Problems:   Schizoaffective disorder, bipolar type (HCC)   Murmur   Ataxia   Protein-calorie malnutrition, severe   AKI (acute kidney injury) (HCC)   Hydrocele     Plan   ## Psychiatric Medication Recommendations:  -- Increase haloperdiol to 20 mg daily -- Start capsaicin  topical ointment for knee pain BID -- Once patient stabilizes on a dose of haloperidol , recommend transitioning him back to haloperidol  decanoate.  ## Medical Decision Making Capacity: Not specifically addressed in this encounter  ## Further Work-up:  While pt on Qtc prolonging medications, please monitor & replete K+ to 4 and Mg2+ to 2 -- most recent EKG on 2/7 had QtC of 420 -- Pertinent labwork reviewed earlier this admission includes: TSH (WNL), B12 (1942-H), UA (consistent with UTI), improving GFR   ## Disposition:-- We recommend inpatient psychiatric hospitalization when medically cleared. Patient is under voluntary admission status at this time; please IVC if attempts to leave hospital. Patient was presented at the [facility]. He will be reviewed by their staff for admission. He said that he would prefer the [facility] hospital in [place] or [place], but they have no beds.   ## Behavioral / Environmental: -Patient would benefit from more frequent contact with medical team to delineate plan of care and allow for clarification questions, which will help alleviate anxiety regarding treatment. If possible, try to check back in with the pt in the afternoon.    ## Safety and Observation Level:  - Based on my clinical evaluation, I estimate the patient to be at low risk of self harm in the current setting. - At this time, we recommend  routine. This decision is based on my review of the chart including patient's history and current presentation, interview  of the patient, mental status examination, and consideration of suicide risk including evaluating suicidal ideation, plan, intent, suicidal or self-harm behaviors, risk factors, and protective factors. This judgment is based on our ability to directly address suicide risk, implement suicide prevention strategies, and develop a safety  plan while the patient is in the clinical setting. Please contact our team if there is a concern that risk level has changed.  CSSR Risk Category:C-SSRS RISK CATEGORY: No Risk  Suicide Risk Assessment: Patient has following modifiable risk factors for suicide: under treated depression , recklessness, and medication noncompliance, which we are addressing by restarting medication, recommending inpatient psychiatric stay once medically stabilized. Patient has following non-modifiable or demographic risk factors for suicide: male gender, history of self harm behavior, and psychiatric hospitalization Patient has the following protective factors against suicide: Supportive friends, Cultural, spiritual, or religious beliefs that discourage suicide, and Frustration tolerance  Thank you for this consult request. Recommendations have been communicated to the primary team.  We will follow at this time.   Lynwood Morene Lavone Delsie, MD       History of Present Illness  Relevant Aspects of Hospital Course:  Admitted on 02/27/2023 for feeling faint, AMS; found to have AKI and acute cystitis without hematuria.   Patient Report:  Met patient bedside this AM. Patient was alert and oriented to person, place, interviewer, and situation. Not oriented to date. Patient was irritable that we keep asking him similar questions day to day that we should have the answer to. Told him that we know the answers to these questions already, but we're checking about the mental state of our patients, so we have to reassess you every day.  Patient was angry that no one was providing physical therapy on his knee, and repeatedly asked the provider and the nurse to provide him with rub downs. Firmly notified patient that it is not what the psychiatry team or nursing staff does. Patient would not comply with the COVID nasal swab required for admission to psychiatric facilities. Attempted to add-on the lab to previous blood  work.   He denied SI/HI/AVH.   Psych ROS:  Depression: Reports good sleep,  Anxiety: Denies significant symptoms of worry except regarding his various medical problems. Mania (lifetime and current): Patient denies a history of mania. Unable to remember how much he's been sleeping lately.  Psychosis: (lifetime and current): Patient reports that he has been psychotic before. Could not provide further details.  Collateral information:  Jhonny Essie Shuck (Sister) [phone] ([place]) on 03/01/2023 at 12:10 pm. No answer.  Review of Systems  Constitutional:  Negative for chills, fever, malaise/fatigue and weight loss.  HENT:  Positive for hearing loss.   Eyes:  Positive for blurred vision.  Respiratory:  Negative for cough.   Cardiovascular:  Negative for chest pain.  Musculoskeletal:  Positive for joint pain.  Neurological:  Positive for headaches. Negative for tingling.  Psychiatric/Behavioral:  Positive for depression. Negative for hallucinations, memory loss, substance abuse and suicidal ideas. The patient is nervous/anxious. The patient does not have insomnia.      Psychiatric and Social History  Psychiatric History:  Information collected from chart  Prev Dx/Sx: schizophrenia vs schizoaffective disorder, bipolar type Current Psych Provider: [facility] Home Meds (current): haloperidol  10 mg at bedtime (non compliant) and haloperidol  decanoate, most recent injection 01/18/23 Previous Med Trials: ziprasidone , risperidone , ativan , zolplidem Therapy: not currently  Prior Psych Hospitalization: last 12/2022, possible others at [facility] hospital. Prior Self Harm:  Prior Violence: reports  false imprisonment for armed robbery in past  Family Psych History: Patient not able to confirm Family Hx suicide: Patient not able to confirm  Social History:  Developmental Hx: Patient not able to confirm Educational Hx: Patient not able to confirm Occupational Hx: Patient not able to  confirm Legal Hx: wrongful arrest for armed robbery Not recent. Not pending. Living Situation: homeless, but leaves thing at his sister's house Spiritual Hx: Emergency Planning/management Officer Hx: [employer] during [place] [employer] Access to weapons/lethal means: denies   Substance History Alcohol: does not drink, pt too disorganized to complete review of substances. Tobacco: 06/11/1955 - 06/11/1975; Smoked an average of 1 pack/day for 20.0 years  Illicit drugs: per chart, this does not appear to be a significant problem for him. Prescription drug abuse: denies Rehab hx: denies  Exam Findings  Physical Exam: Pt is disheveled, wearing a hospital gown, but he kept adjusting the gown today to keep himself covered appropriately. He is unshaven, but was cleaner. He has numerous scars on his body and pen marks are present from the two pens clutched in his left hand. He is not agitated, but he does speak very loudly as a result of his hearing impairment. Today (2/7), patient was irritable and frustrated. He had met with several other physicians prior to his interview with the notewriter, and was irritated that he got many of the same questions. Patient complaining of wanting physical therapy for his stiff knee. Told him that was outside of the area where I work.  Vital Signs:  Temp:  [97.5 F (36.4 C)-97.8 F (36.6 C)] 97.8 F (36.6 C) (02/07 0700) Pulse Rate:  [71-89] 89 (02/07 0700) Resp:  [17-18] 17 (02/07 0700) BP: (107-137)/(58-108) 123/108 (02/07 0700) SpO2:  [97 %-100 %] 97 % (02/07 0700) Blood pressure (!) 123/108, pulse 89, temperature 97.8 F (36.6 C), temperature source Oral, resp. rate 17, height 5' 8 (1.727 m), weight 80 kg, SpO2 97%. Body mass index is 26.82 kg/m.  Physical Exam Vitals and nursing note reviewed.  Constitutional:      General: He is not in acute distress.    Appearance: He is ill-appearing.  HENT:     Head: Normocephalic and atraumatic.  Skin:    General: Skin is warm and dry.   Psychiatric:        Attention and Perception: Perception normal. He is inattentive.        Mood and Affect: Mood is depressed. Affect is inappropriate.        Speech: Speech is rapid and pressured and tangential.        Behavior: Behavior is hyperactive. Behavior is cooperative.        Thought Content: Thought content is paranoid and delusional. Thought content does not include homicidal or suicidal ideation.        Cognition and Memory: Cognition is impaired. Memory is impaired. He exhibits impaired recent memory.        Judgment: Judgment is impulsive.     Mental Status Exam: General Appearance: Disheveled  Orientation:  Other:  Place and person, not day or situation  Memory:  Immediate;   Fair Recent;   Poor Remote;   Fair  Concentration:  Concentration: Fair and Attention Span: Fair  Recall:  Poor  Attention  Fair  Eye Contact:  Good  Speech:  Normal Rate  Language:  Fair Pt uses a number of words incorrectly  Volume:  Increased (hard of hearing)  Mood: I am frustrated with the care I am getting.  You want me to do these tests and answer these questions from you, but you won't give me a rub-down.  Affect:  Congruent and irritable  Thought Process:  Disorganized   Thought Content:  Illogical and Tangential  Suicidal Thoughts:  No  Homicidal Thoughts:  No  Judgement:  Poor  Insight:  Lacking  Psychomotor Activity:  Normal  Akathisia:  No  Fund of Knowledge:  Poor      Assets:  Manufacturing Systems Engineer Resilience Vocational/Educational  Cognition:  Impaired,  Moderate  ADL's:  Impaired  AIMS (if indicated):        Other History   These have been pulled in through the EMR, reviewed, and updated if appropriate.  Family History:  The patient's family history is not on file.  Medical History: Past Medical History:  Diagnosis Date  . Acute cystitis without hematuria 02/28/2023  . AKI (acute kidney injury) (HCC) 02/27/2023  . AMS (altered mental status) 01/06/2023  .  Anemia 08/31/2012  . Benign hypertrophy of prostate   . Dysphagia 03/01/2023  . Hepatitis C   . Hypokalemia 02/06/2013  . Schizophrenia (HCC)   . Swelling of lower extremity 01/13/2023  . Urinary tract infection   . Urinary tract infection 06/13/2011   IMO SNOMED Dx Update Oct 2024    . UTI (urinary tract infection) 06/13/2011   IMO SNOMED Dx Update Oct 2024      Surgical History: Past Surgical History:  Procedure Laterality Date  . KNEE SURGERY     right     Medications:   Current Facility-Administered Medications:  .  acetaminophen  (TYLENOL ) tablet 650 mg, 650 mg, Oral, Q6H PRN **OR** acetaminophen  (TYLENOL ) suppository 650 mg, 650 mg, Rectal, Q6H PRN, Zheng, Jacky, MD .  amLODipine  (NORVASC ) tablet 5 mg, 5 mg, Oral, Daily, Cleotilde, Samantha, DO, 5 mg at 03/04/23 0919 .  enoxaparin  (LOVENOX ) injection 40 mg, 40 mg, Subcutaneous, Daily, Zheng, Jacky, MD, 40 mg at 03/03/23 2217 .  feeding supplement (ENSURE ENLIVE / ENSURE PLUS) liquid 237 mL, 237 mL, Oral, BID BM, Delores Suzann HERO, MD, 237 mL at 03/04/23 9078 .  haloperidol  (HALDOL ) tablet 15 mg, 15 mg, Oral, QHS, Lezli Danek, Lynwood Morene Deems, MD, 15 mg at 03/03/23 2217 .  haloperidol  (HALDOL ) tablet 5 mg, 5 mg, Oral, Q8H PRN, Lenard Calin, MD .  multivitamin with minerals tablet 1 tablet, 1 tablet, Oral, Daily, Delores Suzann HERO, MD, 1 tablet at 03/04/23 9080 .  tamsulosin  (FLOMAX ) capsule 0.4 mg, 0.4 mg, Oral, Daily, Zheng, Jacky, MD, 0.4 mg at 03/04/23 9080 .  thiamine  (VITAMIN B1) tablet 100 mg, 100 mg, Oral, Daily, Lenard Calin, MD, 100 mg at 03/04/23 9081  Allergies: No Known Allergies  Lynwood Morene Deems Delsie, MD

## 2023-03-04 NOTE — Plan of Care (Signed)
 Patient alert/oriented X3. Patient cannot recall the reason why he was admitted into the hospital. Patient refused IV magnesium , covid respiratory swab, and prn haldol  for agitation. Patient was up in chair for a few hours and ate all of his meals. VSS, no complaints at this time.  Problem: Education: Goal: Knowledge of General Education information will improve Description: Including pain rating scale, medication(s)/side effects and non-pharmacologic comfort measures Outcome: Progressing   Problem: Health Behavior/Discharge Planning: Goal: Ability to manage health-related needs will improve Outcome: Progressing   Problem: Clinical Measurements: Goal: Ability to maintain clinical measurements within normal limits will improve Outcome: Progressing   Problem: Clinical Measurements: Goal: Will remain free from infection Outcome: Progressing   Problem: Clinical Measurements: Goal: Diagnostic test results will improve Outcome: Progressing   Problem: Clinical Measurements: Goal: Respiratory complications will improve Outcome: Progressing   Problem: Clinical Measurements: Goal: Cardiovascular complication will be avoided Outcome: Progressing   Problem: Activity: Goal: Risk for activity intolerance will decrease Outcome: Progressing   Problem: Nutrition: Goal: Adequate nutrition will be maintained Outcome: Progressing   Problem: Coping: Goal: Level of anxiety will decrease Outcome: Progressing   Problem: Elimination: Goal: Will not experience complications related to bowel motility Outcome: Progressing   Problem: Elimination: Goal: Will not experience complications related to urinary retention Outcome: Progressing   Problem: Pain Managment: Goal: General experience of comfort will improve and/or be controlled Outcome: Progressing   Problem: Safety: Goal: Ability to remain free from injury will improve Outcome: Progressing   Problem: Skin Integrity: Goal: Risk for  impaired skin integrity will decrease Outcome: Progressing

## 2023-03-05 DIAGNOSIS — F250 Schizoaffective disorder, bipolar type: Secondary | ICD-10-CM | POA: Diagnosis not present

## 2023-03-05 DIAGNOSIS — N179 Acute kidney failure, unspecified: Secondary | ICD-10-CM | POA: Diagnosis not present

## 2023-03-05 LAB — BASIC METABOLIC PANEL
Anion gap: 11 (ref 5–15)
BUN: 22 mg/dL (ref 8–23)
CO2: 22 mmol/L (ref 22–32)
Calcium: 9.8 mg/dL (ref 8.9–10.3)
Chloride: 104 mmol/L (ref 98–111)
Creatinine, Ser: 0.97 mg/dL (ref 0.61–1.24)
GFR, Estimated: >60 mL/min (ref >60)
Glucose, Bld: 97 mg/dL (ref 70–99)
Potassium: 4.1 mmol/L (ref 3.5–5.1)
Sodium: 137 mmol/L (ref 135–145)

## 2023-03-05 LAB — MAGNESIUM: Magnesium: 2 mg/dL (ref 1.7–2.4)

## 2023-03-05 MED ORDER — HALOPERIDOL 5 MG PO TABS
10.0000 mg | ORAL_TABLET | Freq: Two times a day (BID) | ORAL | Status: DC
  Administered 2023-03-05 – 2023-03-10 (×7): 10 mg via ORAL
  Filled 2023-03-05 (×2): qty 2
  Filled 2023-03-05: qty 10
  Filled 2023-03-05 (×3): qty 2
  Filled 2023-03-05: qty 10
  Filled 2023-03-05: qty 2
  Filled 2023-03-05: qty 10
  Filled 2023-03-05: qty 2

## 2023-03-05 NOTE — Assessment & Plan Note (Addendum)
 Likely prerenal, resolved as of 2/2. Cr decreased 1.20 --> 0.97 overnight. No new concerns.  - Continue to monitor.

## 2023-03-05 NOTE — Progress Notes (Signed)
 Daily Progress Note Intern Pager: [phone]  Patient name: [name] Medical record number: [id number] Date of birth: [birth year] Age: 82 y.o. Gender: male  Primary Care Provider: Clinic, [place] Consultants: Psychiatry Code Status: Full  Pt Overview and Major Events to Date:  2/2: Admitted to [facility] TS 2/3: Psych consult placed 2/4: Psych recs creased Haldol  to 15 mg at bedtime 2/5: Ultrasound scrotum with left complex hydrocele, chronic issue 2/7: Psych recs: increase Haldol  15 mg --> 20 mg at bedtime.   Assessment and Plan:  Mr. Meir is an 83 year old male with a past medical history of schizoaffective disorder, bipolar type noncompliant on home meds, hypertension, hep C, hearing loss, BPH and housing insecurity presenting with dizzy spells, gait ataxia and disorganized thinking and admitted for AKI and UTI which have now resolved.  Patient is medically stable for discharge and awaiting psych bed availability. Assessment & Plan Schizoaffective disorder, bipolar type (HCC) No notable changes from yesterday. No EPS noted on exam, denies tremor. Refused PRN PO Haldol  5 mg yesterday for agitation. Awaiting placement for inpatient psych [facility], no beds available at the [facility]) potentially complicated by patient's refusal to undergo COVID nares swab. Yesterday: Qtc 420. Magnesium  improved over last 24h 1.8 --> 2.0 despite refusal of IV magnesium . Potassium remains within goal range 4.1. No other medication refusals.   - Continue Haldol  20 mg at bedtime. - Continue PRN Haldol  5 mg every eight hours for agitation.  - Keep K > 4.0 and Mg > 2.0 per Psych recs.  - AM BMP/Mag ordered for 2/9.  - Repeat EKG 2/8.  Protein-calorie malnutrition, severe RD following. Patient refused to engage with them yesterday.  - Continue Ensure TID.  - Continue multivitamin. AKI (acute kidney injury) (HCC) Likely prerenal, resolved as of 2/2. Cr decreased 1.20 --> 0.97 overnight. No new concerns.  -  Continue to monitor.   Chronic and Stable Problems:  Hydrocele: Confirmed continued presence with US  scrotum on 2/5, no changes from prior study BPH: Continue home Flomax  0.4 mg nightly.  Hypertension: Continue amlodipine  5 mg Aortic stenosis: Found on recent Echo. Stable.  Dysphagia: some trouble swallowing earlier in stay, now eating without incident. ] UTI: +E. Coli. Finished cefadroxil  500 mg BID course (2/2-2/7).  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 Thiamine  WNL. Continue PO thiamine . Consider MRI without contrast if worsens.  Hydrocele: Chronic issue. Given PO lasix  20 mg x3 on 2/5.   FEN/GI: Regular diet PPx: Lovenox  Dispo: Inpatient psychiatric care   pending bed availability .   Subjective:  Calm and cooperative on exam this morning.  Responded to clinical research associate in 1 or 2 word sentences. No medication side effects.  Patient said that he was weak and drowsy and, unchanged from yesterday.  No new concerns.  Patient was incontinent of urine overnight soiling his bed.  Was found sleeping in a chair instead.  Reached out to nursing staff concerning this.  Otherwise no new complaints.  Denied tremor.  Patient had extensive thought blocking but was largely amenable to interview.  Did not discuss delusional content.  Informed patient that his blood work looked good. Objective: Temp:  [97.5 F (36.4 C)-98.4 F (36.9 C)] 98.4 F (36.9 C) (02/07 2043) Pulse Rate:  [71-89] 71 (02/07 2043) Resp:  [17-18] 18 (02/07 2043) BP: (123-137)/(58-108) 133/79 (02/07 2043) SpO2:  [97 %-100 %] 98 % (02/07 2043) Physical Exam: General: Tired appearing gentleman, oriented to self, place. No distress, ambulates and stooped posture without assistance from chair to bed  Cardiovascular: 3 of 6 systolic murmur.  RRR Respiratory: CTAB, no increased work of breathing MSK: Full range of motion.  No EPS noted.  No tremor.  Laboratory: Most recent CBC Lab Results  Component Value Date   WBC 5.6 02/27/2023   HGB 14.9  02/27/2023   HCT 46.0 02/27/2023   MCV 89.5 02/27/2023   PLT 237 02/27/2023   Most recent BMP    Latest Ref Rng & Units 03/04/2023    1:13 AM  BMP  Glucose 70 - 99 mg/dL 98   BUN 8 - 23 mg/dL 22   Creatinine 9.38 - 1.24 mg/dL 8.79   Sodium 864 - 854 mmol/L 137   Potassium 3.5 - 5.1 mmol/L 4.2   Chloride 98 - 111 mmol/L 105   CO2 22 - 32 mmol/L 23   Calcium  8.9 - 10.3 mg/dL 9.2    Rollene Katz, MD 03/05/2023, 1:51 AM  PGY-1, [facility] FPTS Intern pager: [phone], text pages welcome Secure chat group [facility] Teaching Service

## 2023-03-05 NOTE — Consult Note (Signed)
  Psychiatric Consult Follow-up  Patient Name: .[name]  MRN: [id number]  DOB: January 11, 1941  Consult Order details:  Orders (From admission, onward)     Start     Ordered   02/28/23 1139  IP CONSULT TO PSYCHIATRY       Comments: He can be seen tomorrow  Ordering Provider: Lenard Calin, MD  Provider:  (Not yet assigned)  Question Answer  Location [facility]  Reason for Consult? 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 male admitted: Medically for 02/27/2023  9:59 AM for acute kidney injury, altered mental status. He carries the psychiatric diagnoses of schizophrenia and has a past medical history of protein-calorie malnutrition, neurosyphilis, hearing loss, hepatitis C, anemia, BPH, and repeated urinary tract infections in a male.   His current presentation of altered mental status is most consistent with schizoaffective disorder with medication non-adherence. He meets criteria for schizophrenia based on historical chart data, current disorganization.  Current outpatient psychotropic medications include haldol  10 mg QHS and historically he has had a fair response to these medications. He was not compliant with medications prior to admission as evidenced by current disorganization and patient report. We have increased his nightly dosage while inpatient to haldol  15 mg QHS. Please see plan below for detailed recommendations.   03/05/23: Patient seen face to face in bis hospital room. He is awake, alert but disoriented due to ongoing psychosis. He is tangential, illogical and very disorganized. Patient does not know where he is and could  not participate in any meaningful conversations. Patient reports ongoing auditory hallucinations and paranoia, and appears internally preoccupied, looking on the wall and talking to himself as if responding to internal stimuli. On examination, he appears dishevel, malodorous with very poor hygiene. Haldol  medication would be adjusted as seen in plan below.   Diagnoses:  Active Hospital problems: Active Problems:   Schizoaffective disorder, bipolar type (HCC)   Murmur   Ataxia   Protein-calorie malnutrition, severe   AKI (acute kidney injury) (HCC)   Hydrocele    Plan   ## Psychiatric Medication Recommendations:  -- Change haloperidol  to 10 mg at bedtime and 10 mg every morning  -- Start capsaicin  topical ointment for knee pain BID -- Once patient stabilizes on a dose of haloperidol , recommend transitioning him back to haloperidol  decanoate.  ## Medical Decision Making Capacity: Not specifically addressed in this encounter  ## Further Work-up:  While pt on Qtc prolonging medications, please monitor & replete K+ to 4 and Mg2+ to 2 -- most recent EKG on 2/7 had QtC of 420 -- Pertinent labwork reviewed earlier this admission includes: TSH (WNL), B12 (1942-H), UA (consistent with UTI), improving GFR   ## Disposition:-- We recommend inpatient psychiatric hospitalization when medically cleared. Patient is under voluntary admission status at this time; please IVC if attempts to leave hospital. Patient was presented at the [facility] and [facility]. He will be reviewed by their staff for admission. He said that he would prefer the [facility] hospital in [place] or [employer], but they have no beds.   ## Behavioral / Environmental: -  Patient would benefit from more frequent contact with medical team to delineate plan of care and allow for clarification questions, which will help alleviate anxiety regarding treatment. If possible, try to check back in with the pt in the afternoon.    ## Safety and Observation Level:   - Based on my clinical evaluation, I estimate the patient to be at moderate risk of self harm and harm to others in the current setting due to ongoing psychotic behavior - At this time, we recommend  routine. This decision is based on my review of the chart including patient's history and current presentation, interview of the patient, mental status examination, and consideration of suicide risk including evaluating suicidal ideation, plan, intent, suicidal or self-harm behaviors, risk factors, and protective factors. This judgment is based on our ability to directly address suicide risk, implement suicide prevention strategies, and develop a safety plan while the patient is in the clinical setting. Please contact our team if there is a concern that risk level has changed.  CSSR Risk Category:C-SSRS RISK CATEGORY: No Risk  Suicide Risk Assessment: Patient has following modifiable risk factors for suicide: under treated depression , recklessness, and medication noncompliance, which we are addressing by restarting medication, recommending inpatient psychiatric stay once medically stabilized. Patient has following non-modifiable or demographic risk factors for suicide: male gender, history of self harm behavior, and psychiatric hospitalization Patient has the following protective factors against suicide: Supportive friends, Cultural, spiritual, or religious beliefs that discourage suicide, and Frustration tolerance  Thank you for this consult request. Recommendations have been communicated to the primary team.  We will follow at this time.   Adrian DELENA Donath, MD       History of Present Illness  Relevant Aspects of Hospital Course:  Admitted on 02/27/2023 for feeling faint, AMS; found to have AKI and acute cystitis without hematuria.   Patient Report:  Met patient bedside this AM. Patient was alert and oriented to person, place, interviewer, and situation. Not oriented to date. Patient was irritable  that we keep asking him similar questions day to day that we should have the answer to. Told him that we know the answers to these questions already, but we're checking about the mental state of our patients, so we have to reassess you every day.  Patient was angry that no one was providing physical therapy on his knee, and repeatedly asked the provider and the nurse to provide him with rub downs. Firmly notified patient that it is not what the psychiatry team or nursing staff does. Patient would not comply with the COVID nasal swab required for admission to psychiatric facilities. Attempted to add-on the lab to previous blood work.   He denied SI/HI/AVH.   Psych ROS:  Depression: Reports good sleep,  Anxiety: Denies significant symptoms of worry except regarding his various medical problems. Mania (lifetime and current): Patient denies a history of mania. Unable to remember how much he's been sleeping lately.  Psychosis: (lifetime and current): Patient reports that he has been psychotic before. Could not provide further details.  Collateral information:  [name] (Sister) [phone] ([place]) on 03/01/2023 at 12:10 pm. No answer.  Review of Systems  Constitutional:  Negative for chills, fever, malaise/fatigue and weight loss.  HENT:  Positive for hearing loss.   Eyes:  Positive for blurred vision.  Respiratory:  Negative for cough.   Cardiovascular:  Negative for chest pain.  Musculoskeletal:  Positive for joint pain.  Neurological:  Positive for headaches. Negative for  tingling.  Psychiatric/Behavioral:  Positive for depression. Negative for hallucinations, memory loss, substance abuse and suicidal ideas. The patient is nervous/anxious. The patient does not have insomnia.      Psychiatric and Social History  Psychiatric History:  Information collected from chart  Prev Dx/Sx: schizophrenia vs schizoaffective disorder, bipolar type Current Psych Provider: [facility]  Medicine Home Meds (current): haloperidol  10 mg at bedtime (non compliant) and haloperidol  decanoate, most recent injection 01/18/23 Previous Med Trials: ziprasidone , risperidone , ativan , zolplidem Therapy: not currently  Prior Psych Hospitalization: last 12/2022, possible others at [facility] hospital. Prior Self Harm:  Prior Violence: reports false imprisonment for armed robbery in past  Family Psych History: Patient not able to confirm Family Hx suicide: Patient not able to confirm  Social History:  Developmental Hx: Patient not able to confirm Educational Hx: Patient not able to confirm Occupational Hx: Patient not able to confirm Legal Hx: wrongful arrest for armed robbery Not recent. Not pending. Living Situation: homeless, but leaves thing at his sister's house Spiritual Hx: Emergency Planning/management Officer Hx: [employer] during [place] [employer] Access to weapons/lethal means: denies   Substance History Alcohol: does not drink, pt too disorganized to complete review of substances. Tobacco: 06/11/1955 - 06/11/1975; Smoked an average of 1 pack/day for 20.0 years  Illicit drugs: per chart, this does not appear to be a significant problem for him. Prescription drug abuse: denies Rehab hx: denies  Exam Findings  Physical Exam: Pt is disheveled, wearing a hospital gown, but he kept adjusting the gown today to keep himself covered appropriately. He is unshaven, but was cleaner. He has numerous scars on his body and pen marks are present from the two pens clutched in his left hand. He is not agitated, but he does speak very loudly as a result of his hearing impairment. Today (2/7), patient was irritable and frustrated. He had met with several other physicians prior to his interview with the notewriter, and was irritated that he got many of the same questions. Patient complaining of wanting physical therapy for his stiff knee. Told him that was outside of the area where I work.  Vital Signs:  Temp:  [97.7 F (36.5  C)-98.4 F (36.9 C)] 98 F (36.7 C) (02/08 0726) Pulse Rate:  [71-90] 90 (02/08 0726) Resp:  [16-18] 16 (02/08 0726) BP: (109-133)/(70-96) 109/70 (02/08 0726) SpO2:  [98 %-100 %] 100 % (02/08 0726) Blood pressure 109/70, pulse 90, temperature 98 F (36.7 C), temperature source Oral, resp. rate 16, height 5' 8 (1.727 m), weight 80 kg, SpO2 100%. Body mass index is 26.82 kg/m.  Physical Exam Vitals and nursing note reviewed.  Constitutional:      General: He is not in acute distress.    Appearance: He is ill-appearing.  HENT:     Head: Normocephalic and atraumatic.  Skin:    General: Skin is warm and dry.  Psychiatric:        Attention and Perception: Perception normal. He is inattentive.        Mood and Affect: Mood is depressed. Affect is inappropriate.        Speech: Speech is rapid and pressured and tangential.        Behavior: Behavior is hyperactive. Behavior is cooperative.        Thought Content: Thought content is paranoid and delusional. Thought content does not include homicidal or suicidal ideation.        Cognition and Memory: Cognition is impaired. Memory is impaired. He exhibits impaired recent memory.  Judgment: Judgment is impulsive.     Mental Status Exam: General Appearance: Disheveled  Orientation:  Other:  Place and person, not day or situation  Memory:  Immediate;   Fair Recent;   Poor Remote;   Fair  Concentration:  Concentration: Fair and Attention Span: Fair  Recall:  Poor  Attention  Fair  Eye Contact:  Good  Speech:  Normal Rate  Language:  Fair Pt uses a number of words incorrectly  Volume:  Increased (hard of hearing)  Mood: I am frustrated with the care I am getting. You want me to do these tests and answer these questions from you, but you won't give me a rub-down.  Affect:  Congruent and irritable  Thought Process:  Disorganized   Thought Content:  Illogical and Tangential  Suicidal Thoughts:  No  Homicidal Thoughts:  No   Judgement:  Poor  Insight:  Lacking  Psychomotor Activity:  Normal  Akathisia:  No  Fund of Knowledge:  Poor      Assets:  Manufacturing Systems Engineer Resilience Vocational/Educational  Cognition:  Impaired,  Moderate  ADL's:  Impaired  AIMS (if indicated):        Other History   These have been pulled in through the EMR, reviewed, and updated if appropriate.  Family History:  The patient's family history is not on file.  Medical History: Past Medical History:  Diagnosis Date  . Acute cystitis without hematuria 02/28/2023  . AKI (acute kidney injury) (HCC) 02/27/2023  . AMS (altered mental status) 01/06/2023  . Anemia 08/31/2012  . Benign hypertrophy of prostate   . Dysphagia 03/01/2023  . Hepatitis C   . Hypokalemia 02/06/2013  . Schizophrenia (HCC)   . Swelling of lower extremity 01/13/2023  . Urinary tract infection   . Urinary tract infection 06/13/2011   IMO SNOMED Dx Update Oct 2024    . UTI (urinary tract infection) 06/13/2011   IMO SNOMED Dx Update Oct 2024      Surgical History: Past Surgical History:  Procedure Laterality Date  . KNEE SURGERY     right     Medications:   Current Facility-Administered Medications:  .  acetaminophen  (TYLENOL ) tablet 650 mg, 650 mg, Oral, Q6H PRN **OR** acetaminophen  (TYLENOL ) suppository 650 mg, 650 mg, Rectal, Q6H PRN, Elicia Hamlet, MD .  amLODipine  (NORVASC ) tablet 5 mg, 5 mg, Oral, Daily, Cleotilde, Samantha, DO, 5 mg at 03/04/23 0919 .  enoxaparin  (LOVENOX ) injection 40 mg, 40 mg, Subcutaneous, Daily, Zheng, Jacky, MD, 40 mg at 03/04/23 2125 .  feeding supplement (ENSURE ENLIVE / ENSURE PLUS) liquid 237 mL, 237 mL, Oral, BID BM, Delores Suzann HERO, MD, 237 mL at 03/04/23 1614 .  haloperidol  (HALDOL ) tablet 20 mg, 20 mg, Oral, QHS, Wise, Lynwood Morene Deems, MD, 20 mg at 03/04/23 2125 .  haloperidol  (HALDOL ) tablet 5 mg, 5 mg, Oral, Q8H PRN, Lenard Calin, MD .  magnesium  sulfate IVPB 2 g 50 mL, 2 g, Intravenous, Once,  Theophilus Pagan, MD .  multivitamin with minerals tablet 1 tablet, 1 tablet, Oral, Daily, Delores Suzann HERO, MD, 1 tablet at 03/04/23 0919 .  tamsulosin  (FLOMAX ) capsule 0.4 mg, 0.4 mg, Oral, Daily, Zheng, Jacky, MD, 0.4 mg at 03/04/23 0919 .  thiamine  (VITAMIN B1) tablet 100 mg, 100 mg, Oral, Daily, Lenard Calin, MD, 100 mg at 03/04/23 9081  Allergies: No Known Allergies  Chyler Creely A Jovon Streetman, MD

## 2023-03-05 NOTE — Assessment & Plan Note (Addendum)
 No notable changes from yesterday. No EPS noted on exam, denies tremor. Refused PRN PO Haldol  5 mg yesterday for agitation. Awaiting placement for inpatient psych [facility], no beds available at the [facility]) potentially complicated by patient's refusal to undergo COVID nares swab. Yesterday: Qtc 420. Magnesium  improved over last 24h 1.8 --> 2.0 despite refusal of IV magnesium . Potassium remains within goal range 4.1. No other medication refusals.   - Continue Haldol  20 mg at bedtime. - Continue PRN Haldol  5 mg every eight hours for agitation.  - Keep K > 4.0 and Mg > 2.0 per Psych recs.  - AM BMP/Mag ordered for 2/9.  - Repeat EKG 2/8.

## 2023-03-05 NOTE — Assessment & Plan Note (Deleted)
 Given

## 2023-03-05 NOTE — Plan of Care (Signed)

## 2023-03-05 NOTE — Plan of Care (Signed)
 Patient alert/oriented X3. Patient does not recall the reason why he came to the hospital. Patient shows signs of impulsivity/agitation. Telesitter in room to monitor patient from eloping. Patient noncompliant with all medications. Patient dressed in his normal clothes sitting in chair next to door and can be reoriented. VSS.   Problem: Education: Goal: Knowledge of General Education information will improve Description: Including pain rating scale, medication(s)/side effects and non-pharmacologic comfort measures 03/05/2023 1605 by Acquanetta Ned, RN Outcome: Progressing 03/05/2023 1605 by Acquanetta Ned, RN Outcome: Not Progressing   Problem: Health Behavior/Discharge Planning: Goal: Ability to manage health-related needs will improve 03/05/2023 1605 by Acquanetta Ned, RN Outcome: Progressing 03/05/2023 1605 by Acquanetta Ned, RN Outcome: Not Progressing   Problem: Clinical Measurements: Goal: Ability to maintain clinical measurements within normal limits will improve 03/05/2023 1605 by Acquanetta Ned, RN Outcome: Progressing 03/05/2023 1605 by Acquanetta Ned, RN Outcome: Not Progressing   Problem: Clinical Measurements: Goal: Will remain free from infection 03/05/2023 1605 by Acquanetta Ned, RN Outcome: Progressing 03/05/2023 1605 by Acquanetta Ned, RN Outcome: Not Progressing   Problem: Clinical Measurements: Goal: Diagnostic test results will improve 03/05/2023 1605 by Acquanetta Ned, RN Outcome: Progressing 03/05/2023 1605 by Acquanetta Ned, RN Outcome: Not Progressing   Problem: Clinical Measurements: Goal: Respiratory complications will improve 03/05/2023 1605 by Acquanetta Ned, RN Outcome: Progressing 03/05/2023 1605 by Acquanetta Ned, RN Outcome: Not Progressing   Problem: Clinical Measurements: Goal: Cardiovascular complication will be avoided 03/05/2023 1605 by Acquanetta Ned, RN Outcome: Progressing 03/05/2023 1605 by Acquanetta Ned, RN Outcome: Not Progressing   Problem:  Activity: Goal: Risk for activity intolerance will decrease 03/05/2023 1605 by Acquanetta Ned, RN Outcome: Progressing 03/05/2023 1605 by Acquanetta Ned, RN Outcome: Not Progressing   Problem: Nutrition: Goal: Adequate nutrition will be maintained 03/05/2023 1605 by Acquanetta Ned, RN Outcome: Progressing 03/05/2023 1605 by Acquanetta Ned, RN Outcome: Not Progressing   Problem: Coping: Goal: Level of anxiety will decrease 03/05/2023 1605 by Acquanetta Ned, RN Outcome: Progressing 03/05/2023 1605 by Acquanetta Ned, RN Outcome: Not Progressing   Problem: Elimination: Goal: Will not experience complications related to bowel motility 03/05/2023 1605 by Acquanetta Ned, RN Outcome: Progressing 03/05/2023 1605 by Acquanetta Ned, RN Outcome: Not Progressing   Problem: Elimination: Goal: Will not experience complications related to urinary retention 03/05/2023 1605 by Acquanetta Ned, RN Outcome: Progressing 03/05/2023 1605 by Acquanetta Ned, RN Outcome: Not Progressing   Problem: Pain Managment: Goal: General experience of comfort will improve and/or be controlled 03/05/2023 1605 by Acquanetta Ned, RN Outcome: Progressing 03/05/2023 1605 by Acquanetta Ned, RN Outcome: Not Progressing   Problem: Safety: Goal: Ability to remain free from injury will improve 03/05/2023 1605 by Acquanetta Ned, RN Outcome: Progressing 03/05/2023 1605 by Acquanetta Ned, RN Outcome: Not Progressing   Problem: Skin Integrity: Goal: Risk for impaired skin integrity will decrease 03/05/2023 1605 by Acquanetta Ned, RN Outcome: Progressing 03/05/2023 1605 by Acquanetta Ned, RN Outcome: Not Progressing

## 2023-03-05 NOTE — Assessment & Plan Note (Addendum)
 RD following. Patient refused to engage with them yesterday.  - Continue Ensure TID.  - Continue multivitamin.

## 2023-03-06 DIAGNOSIS — N179 Acute kidney failure, unspecified: Secondary | ICD-10-CM | POA: Diagnosis not present

## 2023-03-06 DIAGNOSIS — F250 Schizoaffective disorder, bipolar type: Secondary | ICD-10-CM | POA: Diagnosis not present

## 2023-03-06 LAB — BASIC METABOLIC PANEL
Anion gap: 7 (ref 5–15)
BUN: 24 mg/dL — ABNORMAL HIGH (ref 8–23)
CO2: 23 mmol/L (ref 22–32)
Calcium: 9.5 mg/dL (ref 8.9–10.3)
Chloride: 106 mmol/L (ref 98–111)
Creatinine, Ser: 1.06 mg/dL (ref 0.61–1.24)
GFR, Estimated: >60 mL/min (ref >60)
Glucose, Bld: 94 mg/dL (ref 70–99)
Potassium: 4.2 mmol/L (ref 3.5–5.1)
Sodium: 136 mmol/L (ref 135–145)

## 2023-03-06 LAB — MAGNESIUM: Magnesium: 2.1 mg/dL (ref 1.7–2.4)

## 2023-03-06 NOTE — Progress Notes (Addendum)
 Patient, room and hallway with strong odor of urine.  Patient is incontinent and is saturating his pants and shirt with urine.  He may also be urinating on the floor.  Patient had urine saturated pants and shirts stacked up on his end table.    Nurse, Tech and Housekeeper picked up room, wiped down bed, changed linen, wiped down all surfaces, mopped floor, removed dirty linen, bagged up urine soaked clothing and placed air tight plastic bag.    Patient agrivated, yelling, pacing back and forth in room.  Nurse stayed with tech in room as patient's aggravation escalated.  Tech indicated she did not feel safe alone in room.   Patient refused to take meds. He insisted we call the police.  He said he needed to talk to the police because he was  falsely accused of robbery.

## 2023-03-06 NOTE — Consult Note (Signed)
 [facility] Follow-up  Patient Name: .[name]  MRN: [id number]  DOB: January 27, 1940  Consult Order details:  Orders (From admission, onward)     Start     Ordered   02/28/23 1139  IP CONSULT TO PSYCHIATRY       Comments: He can be seen tomorrow  Ordering Provider: Lenard Calin, MD  Provider:  (Not yet assigned)  Question Answer  Location [facility]  Reason for Consult? 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 male admitted: Medically for 02/27/2023  9:59 AM for acute kidney injury, altered mental status. He carries the psychiatric diagnoses of schizophrenia and has a past medical history of protein-calorie malnutrition, neurosyphilis, hearing loss, hepatitis C, anemia, BPH, and repeated urinary tract infections in a male.   His current presentation of altered mental status is most consistent with schizoaffective disorder with medication non-adherence. He meets criteria for schizophrenia based on historical chart data, current disorganization.  Current outpatient psychotropic medications include haldol  10 mg QHS and historically he has had a fair response to these medications. He was not compliant with medications prior to admission as evidenced by current disorganization and patient report. We have increased his nightly dosage while inpatient to haldol  15 mg QHS. Please see plan below for detailed recommendations.   03/05/23: Patient seen face to face in bis hospital room. He is awake, alert but disoriented due to ongoing psychosis. He is tangential, illogical and very disorganized. Patient does not know where he is and could  not participate in any meaningful conversations. Patient reports ongoing auditory hallucinations and paranoia, and appears internally preoccupied, looking on the wall and talking to himself as if responding to internal stimuli. On examination, he appears dishevel, malodorous with very poor hygiene. Haldol  medication would be adjusted as seen in plan below.   03/06/23: On reassessment today, patient found siting up in chair in his room. He was awake, alert but remains disoriented. He appeared disheveled, poorly groomed and malodorous. He was paranoid and grossly disorganized. He presented agitated and unwilling to engage in conversation by asking me to call the police. Our conversation was mainly about being accused of robbing someone. Patient was visibly upset while going through some papers. He ultimately responded well to redirection and was offered reassurance and support. Will continue on Haldol  as adjusted yesterday to manage daytime agitation. Still pending Covid test which is required for inpatient psychiatric admission.   Diagnoses:  Active Hospital problems: Active Problems:   Schizoaffective disorder, bipolar type (HCC)   Murmur   Ataxia   Protein-calorie malnutrition, severe   Hydrocele    Plan   ## Psychiatric Medication Recommendations:  -- Change haloperidol  to 10 mg at bedtime and 10 mg every morning  -- Start capsaicin  topical ointment for [name] pain BID -- Once patient stabilizes on a dose of haloperidol , recommend transitioning him back to haloperidol  decanoate.  ## Medical Decision Making Capacity: Not specifically addressed in this encounter  ## Further Work-up:  While pt on Qtc prolonging medications, please monitor & replete K+ to 4 and Mg2+ to 2 --  most recent EKG on 2/7 had QtC of 420 -- Pertinent labwork reviewed earlier this admission includes: TSH (WNL), B12 (1940-H), UA (consistent with UTI), improving GFR   ## Disposition:-- We recommend inpatient psychiatric  hospitalization when medically cleared. Patient is under voluntary admission status at this time; please IVC if attempts to leave hospital. Patient was presented at the [facility]. He will be reviewed by their staff for admission. He said that he would prefer the [facility] hospital in [place] or [place], but they have no beds.   ## Behavioral / Environmental: -Patient would benefit from more frequent contact with medical team to delineate plan of care and allow for clarification questions, which will help alleviate anxiety regarding treatment. If possible, try to check back in with the pt in the afternoon.    ## Safety and Observation Level:  - Based on my clinical evaluation, I estimate the patient to be at moderate risk of self harm and harm to others in the current setting due to ongoing psychotic behavior - At this time, we recommend  routine. This decision is based on my review of the chart including patient's history and current presentation, interview of the patient, mental status examination, and consideration of suicide risk including evaluating suicidal ideation, plan, intent, suicidal or self-harm behaviors, risk factors, and protective factors. This judgment is based on our ability to directly address suicide risk, implement suicide prevention strategies, and develop a safety plan while the patient is in the clinical setting. Please contact our team if there is a concern that risk level has changed.  CSSR Risk Category:C-SSRS RISK CATEGORY: No Risk  Suicide Risk Assessment: Patient has following modifiable risk factors for suicide: under treated depression , recklessness, and medication noncompliance, which we are addressing by restarting medication, recommending inpatient psychiatric stay once medically stabilized. Patient has following non-modifiable or demographic risk factors for suicide: male gender, history of self harm behavior, and psychiatric hospitalization Patient has the following  protective factors against suicide: Supportive friends, Cultural, spiritual, or religious beliefs that discourage suicide, and Frustration tolerance  Thank you for this consult request. Recommendations have been communicated to the primary team.  We will follow at this time.   Ryah Cribb, NP       History of Present Illness  Relevant Aspects of Hospital Course:  Admitted on 02/27/2023 for feeling faint, AMS; found to have AKI and acute cystitis without hematuria.   Patient Report:  Met patient bedside this AM, he was found sitting up in watching TV. Patient quickly got up from chair and asked the interviewer to call the police. Patient stated that he has been accused of robbing someone and he needed to alert the police. He was visibly upset and unable to stay calm for this interview.  Also denied AVH but presented with disorganized thought process. He was preoccupied with the thought of being accused of stealing which made it diffult to enage him in logical conversation. He ultimately responded to verbal de-escalation and sat down on the chair.    Psych ROS:  Depression: Reports good sleep,  Anxiety: Denies significant symptoms of worry except regarding his various medical problems. Mania (lifetime and current): Patient denies a history of mania. Unable to remember how much he's been sleeping lately.  Psychosis: (lifetime and current): Patient reports that he has been psychotic before. Could not provide further details.  Collateral information:  Jhonny Essie Shuck (Sister) [phone] ([place]) on 03/01/2023 at 12:10 pm. No answer. Second attempt to obtain collateral  information from patient's sister Essie Shuck (Sister) [phone] [facility]) on 03/06/2023 at 2:35 pm resulted to no answer.  Review of Systems  Constitutional:  Negative for chills, fever, malaise/fatigue and weight loss.  HENT:  Positive for hearing loss.   Eyes:  Positive for blurred vision.  Respiratory:  Negative  for cough.   Cardiovascular:  Negative for chest pain.  Musculoskeletal:  Positive for joint pain.  Neurological:  Negative for headaches.  Psychiatric/Behavioral:  Positive for depression. Negative for hallucinations, memory loss, substance abuse and suicidal ideas. The patient is nervous/anxious. The patient does not have insomnia.      Psychiatric and Social History  Psychiatric History:  Information collected from chart  Prev Dx/Sx: schizophrenia vs schizoaffective disorder, bipolar type Current Psych Provider: [facility] Home Meds (current): haloperidol  10 mg at bedtime (non compliant) and haloperidol  decanoate, most recent injection 01/18/23 Previous Med Trials: ziprasidone , risperidone , ativan , zolplidem Therapy: not currently  Prior Psych Hospitalization: last 12/2022, possible others at [facility] hospital. Prior Self Harm:  Prior Violence: reports false imprisonment for armed robbery in past  Family Psych History: Patient not able to confirm Family Hx suicide: Patient not able to confirm  Social History:  Developmental Hx: Patient not able to confirm Educational Hx: Patient not able to confirm Occupational Hx: Patient not able to confirm Legal Hx: wrongful arrest for armed robbery Not recent. Not pending. Living Situation: homeless, but leaves thing at his sister's house Spiritual Hx: Emergency Planning/management Officer Hx: [employer] during [place] [employer] Access to weapons/lethal means: denies   Substance History Alcohol: does not drink, pt too disorganized to complete review of substances. Tobacco: 06/11/1955 - 06/11/1975; Smoked an average of 1 pack/day for 20.0 years  Illicit drugs: per chart, this does not appear to be a significant problem for him. Prescription drug abuse: denies Rehab hx: denies  Exam Findings  Physical Exam: Pt is disheveled, wearing a hospital gown, but he kept adjusting the gown today to keep himself covered appropriately. He is unshaven, but was cleaner. He has  numerous scars on his body and pen marks are present from the two pens clutched in his left hand. He is not agitated, but he does speak very loudly as a result of his hearing impairment. Today (2/7), patient was irritable and frustrated. He had met with several other physicians prior to his interview with the notewriter, and was irritated that he got many of the same questions. Patient complaining of wanting physical therapy for his stiff [name]. Told him that was outside of the area where I work.  Vital Signs:  Temp:  [97.5 F (36.4 C)-98.9 F (37.2 C)] 97.5 F (36.4 C) (02/09 0727) Pulse Rate:  [79-103] 103 (02/09 0727) Resp:  [16-18] 16 (02/09 0727) BP: (121-133)/(69-93) 125/69 (02/09 0727) SpO2:  [98 %-100 %] 98 % (02/09 0727) Blood pressure 125/69, pulse (!) 103, temperature (!) 97.5 F (36.4 C), temperature source Oral, resp. rate 16, height 5' 8 (1.727 m), weight 80 kg, SpO2 98%. Body mass index is 26.82 kg/m.  Physical Exam Vitals and nursing note reviewed.  Constitutional:      General: He is not in acute distress.    Appearance: He is ill-appearing.  HENT:     Head: Normocephalic and atraumatic.  Skin:    General: Skin is warm and dry.  Psychiatric:        Attention and Perception: Perception normal. He is inattentive.        Mood and Affect: Mood is depressed. Affect is inappropriate.  Speech: Speech is rapid and pressured and tangential.        Behavior: Behavior is hyperactive. Behavior is cooperative.        Thought Content: Thought content is paranoid and delusional. Thought content does not include homicidal or suicidal ideation.        Cognition and Memory: Cognition is impaired. Memory is impaired. He exhibits impaired recent memory.        Judgment: Judgment is impulsive.     Mental Status Exam: General Appearance: Disheveled, poorly groomed, dressed in layers of clothes.   Orientation:  Other:  Place and person, not day or situation  Memory:  Immediate;    Fair Recent;   Poor Remote;   Fair  Concentration:  Concentration: Poor and Attention Span: Poor  Recall:  Poor  Attention  Fair  Eye Contact:  Good  Speech:  Normal Rate  Language:  Fair  Volume:  Increased (hard of hearing)  Mood: Get the police people in here, I didn't rob anyone.   Affect:  Congruent and irritable  Thought Process:  Disorganized   Thought Content:  Illogical and Tangential  Suicidal Thoughts:   unable to obtain   Homicidal Thoughts:   unable to obtain   Judgement:  Poor  Insight:  Lacking  Psychomotor Activity:  Restlessness  Akathisia:  No  Fund of Knowledge:  Poor      Assets:  Manufacturing Systems Engineer Resilience Vocational/Educational  Cognition:  Impaired,  Moderate  ADL's:  Impaired  AIMS (if indicated):        Other History   These have been pulled in through the EMR, reviewed, and updated if appropriate.  Family History:  The patient's family history is not on file.  Medical History: Past Medical History:  Diagnosis Date  . Acute cystitis without hematuria 02/28/2023  . AKI (acute kidney injury) (HCC) 02/27/2023  . AMS (altered mental status) 01/06/2023  . Anemia 08/31/2012  . Benign hypertrophy of prostate   . Dysphagia 03/01/2023  . Hepatitis C   . Hypokalemia 02/06/2013  . Schizophrenia (HCC)   . Swelling of lower extremity 01/13/2023  . Urinary tract infection   . Urinary tract infection 06/13/2011   IMO SNOMED Dx Update Oct 2024    . UTI (urinary tract infection) 06/13/2011   IMO SNOMED Dx Update Oct 2024      Surgical History: Past Surgical History:  Procedure Laterality Date  . [name] SURGERY     right     Medications:   Current Facility-Administered Medications:  .  acetaminophen  (TYLENOL ) tablet 650 mg, 650 mg, Oral, Q6H PRN **OR** acetaminophen  (TYLENOL ) suppository 650 mg, 650 mg, Rectal, Q6H PRN, Elicia Hamlet, MD .  amLODipine  (NORVASC ) tablet 5 mg, 5 mg, Oral, Daily, Cleotilde, Samantha, DO, 5 mg at 03/04/23  0919 .  enoxaparin  (LOVENOX ) injection 40 mg, 40 mg, Subcutaneous, Daily, Zheng, Jacky, MD, 40 mg at 03/05/23 2146 .  feeding supplement (ENSURE ENLIVE / ENSURE PLUS) liquid 237 mL, 237 mL, Oral, BID BM, Delores Suzann HERO, MD, 237 mL at 03/04/23 1614 .  haloperidol  (HALDOL ) tablet 10 mg, 10 mg, Oral, BID, Akintayo, Musa A, MD, 10 mg at 03/05/23 2146 .  haloperidol  (HALDOL ) tablet 5 mg, 5 mg, Oral, Q8H PRN, Lenard Calin, MD .  magnesium  sulfate IVPB 2 g 50 mL, 2 g, Intravenous, Once, Theophilus Pagan, MD .  multivitamin with minerals tablet 1 tablet, 1 tablet, Oral, Daily, Delores Suzann HERO, MD, 1 tablet at 03/04/23 0919 .  tamsulosin  (  FLOMAX ) capsule 0.4 mg, 0.4 mg, Oral, Daily, Zheng, Jacky, MD, 0.4 mg at 03/04/23 0919 .  thiamine  (VITAMIN B1) tablet 100 mg, 100 mg, Oral, Daily, Lenard Calin, MD, 100 mg at 03/04/23 9081  Allergies: No Known Allergies  Ofelia Podolski, NP

## 2023-03-06 NOTE — Progress Notes (Signed)
     Daily Progress Note Intern Pager: [phone]  Patient name: [name] Medical record number: [id number] Date of birth: November 17, 1940 Age: 83 y.o. Gender: male  Primary Care Provider: [facility] Consultants: Psychiatry Code Status: Full  Pt Overview and Major Events to Date:  2/2: Admitted to FMTS 2/3: Psych consult placed 2/4: Psych recs creased Haldol  to 15 mg at bedtime 2/5: Ultrasound scrotum with left complex hydrocele, chronic issue 2/7: Psych recs: increase Haldol  15 mg --> 20 mg at bedtime.   Assessment and Plan:  [name] is an 83 year old male with a past medical history of schizoaffective disorder, bipolar type noncompliant on home meds, hypertension, hep C, hearing loss, BPH and housing insecurity presenting with dizzy spells, gait ataxia and disorganized thinking and admitted for AKI and UTI which have now resolved.  Patient is medically stable for discharge and awaiting psych bed availability.  Assessment & Plan Schizoaffective disorder, bipolar type (HCC) Calm and cooperative on exam today, no complaints. - Psych c/s: recs decrease haldol  to 10 mg at bedtime and 10 mg in am. recommend inpatient psych once bed available, please IVC if attempts to leave hospital.  - K > 4 and Mg > 2 - TOC consult for inpatient psych placement  - Psych working on potential [facility] placement in [place]  - Haldol  5 mg q8 PRN for agitation -AM BMP and Mag Protein-calorie malnutrition, severe RD following. - Continue Ensure TID.  - Continue multivitamin   Chronic and Stable Issues: Hydrocele: Confirmed continued presence with US  scrotum on 2/5, no changes from prior study, Given PO lasix  20 mg x 3 on 2/5 BPH: Continue home Flomax  0.4 mg nightly.  Hypertension: Continue amlodipine  5 mg Aortic stenosis: Found on recent Echo. Stable.  Dysphagia: some trouble swallowing earlier in stay, now eating without incident. ] UTI: +E. Coli. Finished cefadroxil  500 mg BID course  (2/2-2/7).  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 Thiamine  WNL. Continue PO thiamine . Consider MRI without contrast if worsens.   FEN/GI: Regular Diet PPx: Lovenox  Dispo: Inpatient psychiatric care pending bed availability  .    Subjective:  No complaints this morning, talking about police charges he's recieved  Objective: Temp:  [98 F (36.7 C)-98.2 F (36.8 C)] 98 F (36.7 C) (02/08 2007) Pulse Rate:  [71-90] 79 (02/08 2007) Resp:  [16-18] 18 (02/08 2007) BP: (109-133)/(70-79) 133/74 (02/08 2007) SpO2:  [99 %-100 %] 100 % (02/08 2007) Physical Exam: General: NAD, conversant, disoriented Cardiovascular: RRR, NRMG Respiratory: CTABL, normal WOB Abdomen: Soft, NTTP, non-distended Extremities: moving all extremities, no edema  Laboratory: Most recent CBC Lab Results  Component Value Date   WBC 5.6 02/27/2023   HGB 14.9 02/27/2023   HCT 46.0 02/27/2023   MCV 89.5 02/27/2023   PLT 237 02/27/2023   Most recent BMP    Latest Ref Rng & Units 03/05/2023    3:37 AM  BMP  Glucose 70 - 99 mg/dL 97   BUN 8 - 23 mg/dL 22   Creatinine 9.38 - 1.24 mg/dL 9.02   Sodium 864 - 854 mmol/L 137   Potassium 3.5 - 5.1 mmol/L 4.1   Chloride 98 - 111 mmol/L 104   CO2 22 - 32 mmol/L 22   Calcium  8.9 - 10.3 mg/dL 9.8      Jennelle Riis, MD 03/06/2023, 12:04 AM  PGY-3, [facility] FPTS Intern pager: [phone], text pages welcome Secure chat group [facility] Teaching Service

## 2023-03-06 NOTE — Assessment & Plan Note (Addendum)
 RD following. - Continue Ensure TID.  - Continue multivitamin

## 2023-03-06 NOTE — Plan of Care (Signed)
  Problem: Clinical Measurements: Goal: Will remain free from infection Outcome: Progressing Goal: Diagnostic test results will improve Outcome: Progressing Goal: Respiratory complications will improve Outcome: Progressing Goal: Cardiovascular complication will be avoided Outcome: Progressing   Problem: Activity: Goal: Risk for activity intolerance will decrease Outcome: Progressing   Problem: Nutrition: Goal: Adequate nutrition will be maintained Outcome: Progressing   Problem: Elimination: Goal: Will not experience complications related to bowel motility Outcome: Progressing Goal: Will not experience complications related to urinary retention Outcome: Progressing   Problem: Safety: Goal: Ability to remain free from injury will improve Outcome: Progressing   Problem: Skin Integrity: Goal: Risk for impaired skin integrity will decrease Outcome: Progressing

## 2023-03-06 NOTE — Plan of Care (Signed)

## 2023-03-06 NOTE — Assessment & Plan Note (Addendum)
 Calm and cooperative on exam today, no complaints. - Psych c/s: recs decrease haldol  to 10 mg at bedtime and 10 mg in am. recommend inpatient psych once bed available, please IVC if attempts to leave hospital.  - K > 4 and Mg > 2 - TOC consult for inpatient psych placement  - Psych working on potential [facility] placement in [place]  - Haldol  5 mg q8 PRN for agitation -AM BMP and Mag

## 2023-03-06 NOTE — Progress Notes (Signed)
 Mobility Specialist: Progress Note   03/06/23 1242  Mobility  Activity Ambulated with assistance in hallway  Level of Assistance Standby assist, set-up cues, supervision of patient - no hands on  Assistive Device None  Distance Ambulated (ft) 150 ft  Activity Response Tolerated well  Mobility Referral Yes  Mobility visit 1 Mobility  Mobility Specialist Start Time (ACUTE ONLY) 1200  Mobility Specialist Stop Time (ACUTE ONLY) 1210  Mobility Specialist Time Calculation (min) (ACUTE ONLY) 10 min    Pt was agreeable to mobility session - received in chair. SV throughout. No complaints of pain. Returned to room without fault. Left ambulating in room with all needs met, call bell in reach.   Ileana Lute Mobility Specialist Please contact via SecureChat or Rehab office at [phone]

## 2023-03-07 DIAGNOSIS — N179 Acute kidney failure, unspecified: Secondary | ICD-10-CM | POA: Diagnosis not present

## 2023-03-07 DIAGNOSIS — R451 Restlessness and agitation: Secondary | ICD-10-CM | POA: Diagnosis not present

## 2023-03-07 DIAGNOSIS — F250 Schizoaffective disorder, bipolar type: Secondary | ICD-10-CM | POA: Diagnosis not present

## 2023-03-07 DIAGNOSIS — R0789 Other chest pain: Secondary | ICD-10-CM | POA: Diagnosis not present

## 2023-03-07 DIAGNOSIS — R079 Chest pain, unspecified: Secondary | ICD-10-CM

## 2023-03-07 LAB — TROPONIN I (HIGH SENSITIVITY): Troponin I (High Sensitivity): 3 ng/L (ref <18)

## 2023-03-07 MED ORDER — LORAZEPAM 2 MG/ML IJ SOLN
2.0000 mg | Freq: Once | INTRAMUSCULAR | Status: AC
  Administered 2023-03-07: 2 mg via INTRAMUSCULAR
  Filled 2023-03-07: qty 1

## 2023-03-07 MED ORDER — LORAZEPAM 2 MG/ML IJ SOLN: 1.0000 mg | Freq: Once | INTRAMUSCULAR | Status: DC

## 2023-03-07 MED ORDER — HALOPERIDOL LACTATE 5 MG/ML IJ SOLN: 5.0000 mg | Freq: Three times a day (TID) | INTRAMUSCULAR | Status: DC | PRN

## 2023-03-07 MED ORDER — HALOPERIDOL LACTATE 5 MG/ML IJ SOLN
5.0000 mg | Freq: Once | INTRAMUSCULAR | Status: DC
  Filled 2023-03-07: qty 1

## 2023-03-07 MED ORDER — LORAZEPAM 1 MG PO TABS: 2.0000 mg | ORAL_TABLET | Freq: Once | ORAL | Status: AC

## 2023-03-07 MED ORDER — HALOPERIDOL LACTATE 5 MG/ML IJ SOLN
20.0000 mg | Freq: Once | INTRAMUSCULAR | Status: AC
  Administered 2023-03-07: 20 mg via INTRAMUSCULAR
  Filled 2023-03-07 (×2): qty 4

## 2023-03-07 MED ORDER — HALOPERIDOL 1 MG PO TABS
5.0000 mg | ORAL_TABLET | Freq: Three times a day (TID) | ORAL | Status: DC | PRN
  Administered 2023-03-09: 5 mg via ORAL
  Filled 2023-03-07: qty 5

## 2023-03-07 NOTE — Assessment & Plan Note (Signed)
 Stable. Labs WNL. Consistent with known aortic stenosis seen on ECHO - Replace lytes as needed

## 2023-03-07 NOTE — Progress Notes (Addendum)
 PT Cancellation Note  Patient Details Name: [name] MRN: [id number] DOB: [birth year]  Addendum: Patient being IVC'd  Cancelled Treatment:    Reason Eval/Treat Not Completed: Medical issues which prohibited therapy Patient reporting chest pain this am. Being worked up for this. He is also very confused, thinks he is in jail. Ambulated independently up to nursing desk. Will re-attempt when appropriate.   Chrystie Hagwood 03/07/2023, 9:56 AM

## 2023-03-07 NOTE — Assessment & Plan Note (Signed)
 RD following. - Continue Ensure TID.  - Continue multivitamin

## 2023-03-07 NOTE — TOC Progression Note (Addendum)
 Transition of Care [facility]) - Progression Note    Patient Details  Name: [name] MRN: [id number] Date of Birth: April 11, 1940  [facility] CM/SW Contact  Juliane Och, LCSW Phone Number: 03/07/2023, 9:05 AM  Clinical Narrative:     9:06 AM [facility] IP Psychiatry unit is currently full and informed CSW/medical team that patient is ineligible for adult unit. CSW submitted referrals to IP Psychiatry units in [place].  10:18 AM [facility] called CSW informing that they do not have bed availability at this time.  12:52 PM Per IP Psychiatry request, CSW submitted IVC (Envelope [id number]).  Expected Discharge Plan: Psychiatric Hospital Barriers to Discharge: Psych Bed not available  Expected Discharge Plan and Services In-house Referral: Clinical Social Work     Living arrangements for the past 2 months: Single Family Home, Homeless Shelter                                       Social Determinants of Health (SDOH) Interventions SDOH Screenings   Food Insecurity: Food Insecurity Present (02/28/2023)  Housing: High Risk (02/28/2023)  Transportation Needs: Patient Unable To Answer (02/28/2023)  Utilities: Not At Risk (02/28/2023)  Social Connections: Patient Declined (02/28/2023)  Tobacco Use: Medium Risk (02/27/2023)    Readmission Risk Interventions     No data to display

## 2023-03-07 NOTE — Assessment & Plan Note (Signed)
 Pt reported chest pain overnight and this morning. Denies shortness of breath, diaphoresis, dyspnea or radiating pain. EKG below. Patient unwilling to participate in lab draws and further attempted evaluation this morning. Will re-engage this afternoon. Per monitor patient pulse 92 later this morning.  - EKG Tachycardia HR 165, Wide QRS w/ RBBB. Qtc prolonged 510 ms - Trops

## 2023-03-07 NOTE — Plan of Care (Signed)
  Problem: Clinical Measurements: Goal: Will remain free from infection Outcome: Progressing Goal: Respiratory complications will improve Outcome: Progressing Goal: Cardiovascular complication will be avoided Outcome: Progressing   Problem: Nutrition: Goal: Adequate nutrition will be maintained Outcome: Progressing   Problem: Elimination: Goal: Will not experience complications related to bowel motility Outcome: Progressing Goal: Will not experience complications related to urinary retention Outcome: Progressing   Problem: Skin Integrity: Goal: Risk for impaired skin integrity will decrease Outcome: Progressing

## 2023-03-07 NOTE — Progress Notes (Signed)
 Daily Progress Note Intern Pager: [phone]  Patient name: [name] Medical record number: [id number] Date of birth: [birth year] Age: 83 y.o. Gender: male  Primary Care Provider: Clinic, [facility] Consultants: Psychiatry Code Status: Full  Pt Overview and Major Events to Date:  2/2: Admitted to FMTS 2/3: Psych consult placed 2/4: Psych recs creased Haldol  to 15 mg at bedtime 2/5: Ultrasound scrotum with left complex hydrocele, chronic issue 2/7: Psych recs: increase Haldol  15 mg --> 20 mg at bedtime.  2/8: Psych recs: Haldol  10 mg BID   Assessment and Plan:  [name] is an 83 year old male with a past medical history of schizoaffective disorder, bipolar type noncompliant on home meds, hypertension, hep C, hearing loss, BPH and housing insecurity presenting with dizzy spells, gait ataxia and disorganized thinking and admitted for AKI and UTI which have now resolved.  Patient is medically stable for discharge and awaiting psych bed availability.  Assessment & Plan Schizoaffective disorder, bipolar type (HCC) Patient decompensating more agitated, disorganized, paranoid and delusional on exam this morning. Suspects, police brought him into facility for robbing someone. Patient refused scheduled haldol  and again this morning. Patient given IM Haldol  and Ativan  to help with agitation. Psych following and adjusting medications.  - Psych following, appreciate recs  - Patient given 20 mg IG Haldol  x1 and Ativan  2 mg IM x1  - Initiated IVC on 03/07/2023, recommend inpatient psych once bed available - K > 4 and Mg > 2 - TOC consult for inpatient psych placement  -AM BMP and Mag Chest pain Pt reported chest pain overnight and this morning. Denies shortness of breath, diaphoresis, dyspnea or radiating pain. EKG below. Patient unwilling to participate in lab draws and further attempted evaluation this morning. Will re-engage this afternoon. Per monitor patient pulse 92 later this  morning.  - EKG Tachycardia HR 165, Wide QRS w/ RBBB. Qtc prolonged 510 ms - Trops  Murmur Stable.  Labs WNL. Consistent with known aortic stenosis seen on ECHO - Replace lytes as needed  Protein-calorie malnutrition, severe RD following. - Continue Ensure TID.  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 US  Scrotum for 07/15/2014 noted bilateral hydrocele L>R. Denies pain.  - 2/5 US  Scrotum consistent w/ complex L hydrocele  - CTM   Chronic and Stable Problems:  BPH: Continue home Flomax  0.4 mg nightly HTN: Blood pressure 130-140s this morning, continue Amlodipine  5 mg to optimize BP control.  Hyperkalemia: (resolved on 2/3)  FEN/GI: Regular diet PPx: Lovenox  Dispo: Inpatient psychiatric hospitalization, once medically cleared, Please IVC if attempts to leave    Chronic and Stable Issues: Hydrocele: Confirmed continued presence with US  scrotum on 2/5, no changes from prior study, Given PO lasix  20 mg x 3 on 2/5 BPH: Continue home Flomax  0.4 mg nightly.  Hypertension: Continue amlodipine  5 mg Aortic stenosis: Found on recent Echo. Stable.  Dysphagia: some trouble swallowing earlier in stay, now eating without incident. ] UTI: +E. Coli. Finished cefadroxil  500 mg BID course (2/2-2/7).  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 Thiamine  WNL. Continue PO thiamine . Consider MRI without contrast if worsens.   FEN/GI: Regular Diet PPx: Lovenox  Dispo: Inpatient psychiatric care pending bed availability  .    Subjective:  Patient complaining of chest pain. Denies shortness of breath and dyspnea. Also discusses being here due to police and accusations of robbing someone.   Objective: Temp:  [97.2 F (36.2 C)-97.8 F (  36.6 C)] 97.2 F (36.2 C) (02/10 1358) Pulse Rate:  [78-92] 92 (02/10 1358) Resp:  [18] 18 (02/10 1358) BP: (100-143)/(75-87) 100/75 (02/10 1358) SpO2:  [100 %] 100 %  (02/10 1358) Physical Exam: General: NAD, agitated, non ill appearing, Cardiovascular: Tachycardic, 2/6 systolic murmur in RUSB   Respiratory: CTABL, normal WOB Abdomen: Soft, NTTP, non-distended Extremities: moving all extremities, no edema  Laboratory: Most recent CBC Lab Results  Component Value Date   WBC 5.6 02/27/2023   HGB 14.9 02/27/2023   HCT 46.0 02/27/2023   MCV 89.5 02/27/2023   PLT 237 02/27/2023   Most recent BMP    Latest Ref Rng & Units 03/06/2023    1:05 AM  BMP  Glucose 70 - 99 mg/dL 94   BUN 8 - 23 mg/dL 24   Creatinine 3.08 - 1.24 mg/dL 6.57   Sodium 846 - 962 mmol/L 136   Potassium 3.5 - 5.1 mmol/L 4.2   Chloride 98 - 111 mmol/L 106   CO2 22 - 32 mmol/L 23   Calcium  8.9 - 10.3 mg/dL 9.5    EKG: Tachycardia HR 165, Wide QRS w/ RBBB. Qtc prolonged 510 ms   Brayton Calin, MD 03/07/2023, 2:03 PM  PGY-1, [facility] FPTS Intern pager: [phone], text pages welcome Secure chat group [facility] Teaching Service

## 2023-03-07 NOTE — Consult Note (Addendum)
I have independently evaluated the patient during a face-to-face assessment on 2/11. I reviewed the patient's chart, and I participated in key portions of the service. I discussed the case with the [employer], and I agree with the assessment and plan of care as documented in the House Officer's note.   Have made light edits to note below. Agree with bulk of note although disagree with implication he met NEFM criteria on this date of service.  Through benefit of retrospectoscope, have seen that pt assented to PM haldol dose 2/10 after getting IM dose for agitation.   Margaretha Seeds, MD    [facility] Psychiatric Consult Follow-up  Patient Name: .[name]  MRN: [id number]  DOB: 26-Jun-1940  Consult Order details:  Orders (From admission, onward)     Start     Ordered   02/28/23 1139  IP CONSULT TO PSYCHIATRY       Comments: He can be seen tomorrow  Ordering Provider: Peterson Ao, MD  Provider:  (Not yet assigned)  Question Answer  Location [facility]  Reason for Consult? 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 male admitted: Medically for 02/27/2023  9:59 AM for acute kidney injury, altered mental status. He carries the psychiatric diagnoses of schizophrenia and has a past medical history of protein-calorie malnutrition, neurosyphilis, hearing loss, hepatitis C, anemia, BPH, and repeated urinary tract infections in a male.   His current presentation of altered mental status is most consistent with schizoaffective disorder with medication non-adherence. He meets criteria for schizophrenia based on historical chart data, current  disorganization.  Current outpatient psychotropic medications include haldol 10 mg QHS and historically he has had a fair response to these medications. He was not compliant with medications prior to admission as evidenced by current disorganization and patient report. We have increased his nightly dosage while inpatient to haldol 15 mg QHS. Please see plan below for detailed recommendations.   Patient would not consent to taking scheduled haldol over the weekend. Pt decompensated between his presentation on Friday and this morning.  Diagnoses:  Active Hospital problems: Active Problems:   Schizoaffective disorder, bipolar type (HCC)   Murmur   Protein-calorie malnutrition, severe   Hydrocele   Chest pain   Agitation    Plan   ## Psychiatric Medication Recommendations:  -- One time order of Haldol 20 mg IM.  -- Change haloperidol to 10 mg at bedtime and 10 mg every morning  -- Once patient stabilizes on a dose of haloperidol, recommend transitioning him back to haloperidol decanoate.  ## Medical Decision Making Capacity:  Patient currently lacks capacity due to altered mental status  ## Further Work-up:  While pt on Qtc prolonging medications, please monitor & replete K+ to 4 and Mg2+ to 2 -- most recent EKG on 2/7 had QtC of 420 -- Pertinent labwork reviewed earlier this admission includes: TSH (WNL), B12 (1942-H), UA (consistent with UTI), improving GFR   ## Disposition:-- We recommend inpatient psychiatric hospitalization after medical hospitalization. Patient has been involuntarily committed on 03/07/2023.  Patient [facility]. He  will be reviewed by their staff for admission. He said that he would prefer the [facility] hospital in [place] or [place], but they have no beds.   ## Behavioral / Environmental: -Patient would benefit from more frequent contact with medical team to delineate plan of care and allow for clarification questions, which will help alleviate  anxiety regarding treatment. If possible, try to check back in with the pt in the afternoon.    ## Safety and Observation Level:  - Based on my clinical evaluation, I estimate the patient to be at moderate risk of self harm and harm to others in the current setting due to ongoing psychotic behavior - At this time, we recommend  routine. This decision is based on my review of the chart including patient's history and current presentation, interview of the patient, mental status examination, and consideration of suicide risk including evaluating suicidal ideation, plan, intent, suicidal or self-harm behaviors, risk factors, and protective factors. This judgment is based on our ability to directly address suicide risk, implement suicide prevention strategies, and develop a safety plan while the patient is in the clinical setting. Please contact our team if there is a concern that risk level has changed.  CSSR Risk Category:C-SSRS RISK CATEGORY: No Risk  Suicide Risk Assessment: Patient has following modifiable risk factors for suicide: under treated depression , recklessness, and medication noncompliance, which we are addressing by restarting medication, recommending inpatient psychiatric stay once medically stabilized. Patient has following non-modifiable or demographic risk factors for suicide: male gender, history of self harm behavior, and psychiatric hospitalization Patient has the following protective factors against suicide: Supportive friends, Cultural, spiritual, or religious beliefs that discourage suicide, and Frustration tolerance  Thank you for this consult request. Recommendations have been communicated to the primary team.  We will follow at this time.   Margaretmary Dys, MD       History of Present Illness  Relevant Aspects of Hospital Course:  Admitted on 02/27/2023 for feeling faint, AMS; found to have AKI and acute cystitis without hematuria.   Patient Report:   Reviewed medical administration, patient refused his haldol all weekend. Met patient bedside this AM with attending physician Dr. Gasper Sells.  Patient was oriented to the name of the notewriter, but was otherwise disoriented to place and date and situation.  Patient was perseverating that we were keeping him here because he had robbed someone.  Patient was difficult to redirect and reorient.  No acute worries for an encephalopathic process, however the patient's refusals over the weekend to take his Haldol seem to have led to a worsening of his psychiatric condition.  Told the patient that he could take his medication either by mouth or with a shot, patient refused and became increasingly paranoid and agitated.    At this time, decided that it would be required to involuntarily commit patient Pt IVC'd due to agitation, psychosis, and threats to leave. Do not feel he meets criteria for NEFM at this time - as best as I can tell he was not confronted on skipped haldol doses over the weekend.   Psych ROS:  Depression: Reports an improvement in his mood, this is believed by his affect which is increasingly agitated. Anxiety: Denies significant symptoms of worry  Mania (lifetime and current): Patient denies a history of mania. Unable to remember how much he's been sleeping lately.  Psychosis: (lifetime and current): Patient appears of currently psychotic.  Thinking is disorganized, he is hallucinating that people are broadcasting negative  images into his head, he is increasingly paranoid about police and legal charges.  Patient is increasingly difficult to redirect.  Collateral information:  Alona Bene (Sister) [phone] (Mobile) on 03/01/2023 at 12:10 pm. No answer. Second attempt to obtain collateral information from patient's sister [name] (Sister) [phone] [facility]) on 03/06/2023 at 2:35 pm resulted to no answer.  Review of Systems  Constitutional:  Negative for chills, fever,  malaise/fatigue and weight loss.  HENT:  Positive for hearing loss.   Eyes:  Positive for blurred vision.  Respiratory:  Negative for cough.   Cardiovascular:  Negative for chest pain.  Neurological:  Negative for headaches.  Psychiatric/Behavioral:  Positive for depression, hallucinations and memory loss. Negative for substance abuse and suicidal ideas. The patient is nervous/anxious. The patient does not have insomnia.      Psychiatric and Social History  Psychiatric History:  Information collected from chart  Prev Dx/Sx: schizophrenia vs schizoaffective disorder, bipolar type Current Psych Provider: [facility] Home Meds (current): haloperidol 10 mg at bedtime (non compliant) and haloperidol decanoate, most recent injection 01/18/23 Previous Med Trials: ziprasidone, risperidone, ativan, zolplidem Therapy: not currently  Prior Psych Hospitalization: last 12/2022, possible others at [facility]. Prior Self Harm:  Prior Violence: reports false imprisonment for armed robbery in past  Family Psych History: Patient not able to confirm Family Hx suicide: Patient not able to confirm  Social History:  Developmental Hx: Patient not able to confirm Educational Hx: Patient not able to confirm Occupational Hx: Patient not able to confirm Legal Hx: "wrongful arrest for armed robbery" Not recent. Not pending. Living Situation: homeless, but leaves thing at his sister's house Spiritual Hx: Emergency planning/management officer Hx: [employer] during [place] war Access to weapons/lethal means: denies   Substance History Alcohol: does not drink, pt too disorganized to complete review of substances. Tobacco: 06/11/1955 - 06/11/1975; Smoked an average of 1 pack/day for 20.0 years  Illicit drugs: per chart, this does not appear to be a significant problem for him. Prescription drug abuse: denies Rehab hx: denies  Exam Findings  Physical Exam: Pt is disheveled, malodorous, and his eyes are constantly looking  for threats. He is Transport planner. He has numerous scars on his body and pen marks are present from the two pens clutched in his left hand. He is not agitated, but he does speak very loudly as a result of his hearing impairment.   Vital Signs:  Temp:  [97.2 F (36.2 C)-97.8 F (36.6 C)] 97.2 F (36.2 C) (02/10 1358) Pulse Rate:  [78-92] 92 (02/10 1358) Resp:  [18] 18 (02/10 1358) BP: (100-143)/(75-87) 100/75 (02/10 1358) SpO2:  [100 %] 100 % (02/10 1358) Blood pressure 100/75, pulse 92, temperature (!) 97.2 F (36.2 C), resp. rate 18, height 5\' 8"  (1.727 m), weight 80 kg, SpO2 100%. Body mass index is 26.82 kg/m.  Physical Exam Vitals and nursing note reviewed.  Constitutional:      General: He is not in acute distress.    Appearance: He is ill-appearing.  HENT:     Head: Normocephalic and atraumatic.  Skin:    General: Skin is warm and dry.  Neurological:     Mental Status: He is disoriented.  Psychiatric:        Attention and Perception: Perception normal. He is inattentive.        Mood and Affect: Mood is depressed. Affect is inappropriate.        Speech: Speech is rapid and pressured and tangential.  Behavior: Behavior is uncooperative, agitated and hyperactive.        Thought Content: Thought content is paranoid and delusional. Thought content does not include homicidal or suicidal ideation.        Cognition and Memory: Cognition is impaired. Memory is impaired. He exhibits impaired recent memory.        Judgment: Judgment is impulsive.     Mental Status Exam: General Appearance: Disheveled, poorly groomed, dressed in layers of clothes.   Orientation:  Other:  Physician and person, not day, place, or situation  Memory:  Immediate;   Poor Recent;   Fair Remote;   Poor  Concentration:  Concentration: Poor and Attention Span: Poor  Recall:  Poor  Attention  Fair  Eye Contact:  Good (staring)  Speech:  Normal Rate  Language:  Fair  Volume:  Increased (hard of  hearing)  Mood: "I am frustrated that y'all have me in here for a robbery I didn't commit"  Affect:  Congruent and irritable  Thought Process:  Disorganized   Thought Content:  Illogical and Tangential  Suicidal Thoughts:   unable to obtain   Homicidal Thoughts:   unable to obtain   Judgment:  Poor  Insight:  Lacking  Psychomotor Activity:  Restlessness  Akathisia:  No  Fund of Knowledge:  Poor      Assets:  Manufacturing systems engineer Resilience Vocational/Educational  Cognition:  Impaired,  Moderate  ADL's:  Impaired  AIMS (if indicated):        Other History   These have been pulled in through the EMR, reviewed, and updated if appropriate.  Family History:  The patient's family history is not on file.  Medical History: Past Medical History:  Diagnosis Date   Acute cystitis without hematuria 02/28/2023   AKI (acute kidney injury) (HCC) 02/27/2023   AMS (altered mental status) 01/06/2023   Anemia 08/31/2012   Benign hypertrophy of prostate    Dysphagia 03/01/2023   Hepatitis C    Hypokalemia 02/06/2013   Schizophrenia (HCC)    Swelling of lower extremity 01/13/2023   Urinary tract infection    Urinary tract infection 06/13/2011   IMO SNOMED Dx Update Oct 2024     UTI (urinary tract infection) 06/13/2011   IMO SNOMED Dx Update Oct 2024      Surgical History: Past Surgical History:  Procedure Laterality Date   KNEE SURGERY     right     Medications:   Current Facility-Administered Medications:    acetaminophen (TYLENOL) tablet 650 mg, 650 mg, Oral, Q6H PRN **OR** acetaminophen (TYLENOL) suppository 650 mg, 650 mg, Rectal, Q6H PRN, Lincoln Brigham, MD   amLODipine (NORVASC) tablet 5 mg, 5 mg, Oral, Daily, Hyacinth Meeker, Samantha, DO, 5 mg at 03/04/23 0919   enoxaparin (LOVENOX) injection 40 mg, 40 mg, Subcutaneous, Daily, Lincoln Brigham, MD, 40 mg at 03/05/23 2146   feeding supplement (ENSURE ENLIVE / ENSURE PLUS) liquid 237 mL, 237 mL, Oral, BID BM, Westley Chandler, MD, 237  mL at 03/04/23 1614   haloperidol (HALDOL) tablet 10 mg, 10 mg, Oral, BID, Akintayo, Musa A, MD, 10 mg at 03/05/23 2146   haloperidol (HALDOL) tablet 5 mg, 5 mg, Oral, Q8H PRN **OR** haloperidol lactate (HALDOL) injection 5 mg, 5 mg, Intramuscular, Q8H PRN, Fatima Blank, Adriana, DO   magnesium sulfate IVPB 2 g 50 mL, 2 g, Intravenous, Once, Elberta Fortis, MD   multivitamin with minerals tablet 1 tablet, 1 tablet, Oral, Daily, Westley Chandler, MD, 1 tablet at 03/04/23 [phone]  tamsulosin (FLOMAX) capsule 0.4 mg, 0.4 mg, Oral, Daily, Lincoln Brigham, MD, 0.4 mg at 03/04/23 0919   thiamine (VITAMIN B1) tablet 100 mg, 100 mg, Oral, Daily, Peterson Ao, MD, 100 mg at 03/04/23 4098  Allergies: No Known Allergies  Margaretmary Dys, MD

## 2023-03-07 NOTE — Assessment & Plan Note (Signed)
 Patient has a chronic history of Hydrocele, Per EMR patient been complaining about scrotal edema for several years. US  Scrotum for 07/15/2014 noted bilateral hydrocele L>R. Denies pain.  - 2/5 US  Scrotum consistent w/ complex L hydrocele  - CTM   Chronic and Stable Problems:  BPH: Continue home Flomax  0.4 mg nightly HTN: Blood pressure 130-140s this morning, continue Amlodipine  5 mg to optimize BP control.  Hyperkalemia: (resolved on 2/3)  FEN/GI: Regular diet PPx: Lovenox  Dispo: Inpatient psychiatric hospitalization, once medically cleared, Please IVC if attempts to leave

## 2023-03-07 NOTE — Progress Notes (Signed)
 Patient not following commands. Non compliant, refusing meds,frequent out of room at nurses station yelling at staff about being wrongly accused of robbery and being held against his will.  He thinks he is in jail.  He is demanding to be discharged.    Yelling and agitation escalating.  Contacted MD who ordered IM haldol .  Patient refused medication.  Security contacted and assisted  controlling patient while charge nurse administered shot.  MD working in [employer]. Patient currently sitting in chair in room.  Tele sitter has been calling when patient leaves room.

## 2023-03-07 NOTE — Progress Notes (Signed)
 Patient transfer to 2W.  Called report to nurse.  Questions answered.  Packed up patient belongings, wheeled patient in bed to room 2W-04.  Tech moved [employer] to new room.  Called Tele Sitter to inform them of transfer.

## 2023-03-07 NOTE — Assessment & Plan Note (Signed)
 Patient decompensating more agitated, disorganized, paranoid and delusional on exam this morning. Suspects, police brought him into facility for robbing someone. Patient refused scheduled haldol  and again this morning. Patient given IM Haldol  and Ativan  to help with agitation. Psych following and adjusting medications.  - Psych following, appreciate recs  - Patient given 20 mg IG Haldol  x1 and Ativan  2 mg IM x1  - Initiated IVC on 03/07/2023, recommend inpatient psych once bed available - K > 4 and Mg > 2 - TOC consult for inpatient psych placement  -AM BMP and Mag

## 2023-03-08 DIAGNOSIS — R000 Tachycardia, unspecified: Secondary | ICD-10-CM

## 2023-03-08 DIAGNOSIS — F250 Schizoaffective disorder, bipolar type: Secondary | ICD-10-CM | POA: Diagnosis not present

## 2023-03-08 DIAGNOSIS — R079 Chest pain, unspecified: Secondary | ICD-10-CM | POA: Diagnosis not present

## 2023-03-08 DIAGNOSIS — R451 Restlessness and agitation: Secondary | ICD-10-CM | POA: Diagnosis not present

## 2023-03-08 DIAGNOSIS — N179 Acute kidney failure, unspecified: Secondary | ICD-10-CM | POA: Diagnosis not present

## 2023-03-08 LAB — TROPONIN I (HIGH SENSITIVITY)
Troponin I (High Sensitivity): 3 ng/L (ref <18)
Troponin I (High Sensitivity): 4 ng/L (ref <18)

## 2023-03-08 MED ORDER — DILTIAZEM HCL 30 MG PO TABS
30.0000 mg | ORAL_TABLET | Freq: Once | ORAL | Status: DC
Start: 2023-03-08 — End: 2023-03-08

## 2023-03-08 MED ORDER — DILTIAZEM HCL 25 MG/5ML IV SOLN
10.0000 mg | INTRAVENOUS | Status: AC
  Administered 2023-03-08: 10 mg via INTRAVENOUS
  Filled 2023-03-08: qty 5

## 2023-03-08 MED ORDER — DILTIAZEM HCL-DEXTROSE 125-5 MG/125ML-% IV SOLN (PREMIX)
5.0000 mg/h | INTRAVENOUS | Status: DC
  Filled 2023-03-08: qty 125

## 2023-03-08 NOTE — Progress Notes (Signed)
Daily Progress Note Intern Pager: [phone]  Patient name: [name] Medical record number: [id number] Date of birth: March 23, 1940 Age: 83 y.o. Gender: male  Primary Care Provider: [facility], [facility] Consultants: Psychiatry Code Status: Full Code   Pt Overview and Major Events to Date:  2/2: Admitted to FMTS 2/3: Psych consult placed 2/4: Psych recs creased Haldol to 15 mg at bedtime 2/5: Ultrasound scrotum with left complex hydrocele, chronic issue 2/7: Psych recs: increase Haldol 15 mg --> 20 mg at bedtime.  2/8: Psych recs: Haldol 10 mg qAM and 10 mg at bedtime  2/10: IVC intiated   Assessment and Plan:  [name] is an 83 year old male with a past medical history of schizoaffective disorder, bipolar type noncompliant on home meds, hypertension, hep C, hearing loss, BPH and housing insecurity presenting with dizzy spells, gait ataxia and disorganized thinking and admitted for AKI and UTI which have now resolved.  Patient with intermittent chest pain and SVT yesterday and this morning. Given IV meds for rate control and considering PO option. Awaiting psych bed availability, pending medical stability.   Assessment & Plan Schizoaffective disorder, bipolar type (HCC) Patient more linear and organized with questioning this morning. Agitation improving.  - Psych following, appreciate recs  - Haldol 10 mg qAM and 10 mg at bedtime, Haldol 5 mg PO or IM for agitation q8h as needed  - IVC 2/10 needs renewal on 2/17, recommend inpatient psych once bed available - 1:1 sitter in place  - K > 4 and Mg > 2 - BMP  - TOC consult for inpatient psych placement  Chest pain Pt reported intermittent chest pain this morning. Denies shortness of breath, diaphoresis, dyspnea or radiating pain. Tachy to 170s. EKG with sinus rhythm w/ PAC. Trops x2 peaked at 4 - IV Diltiazem 10 mg IV x1 - can consider diltiazem drip or PO diltiazem if rates not well controlled  - can consider cardiology  consult if worsening symptoms  Murmur Stable.  Labs WNL. Consistent with known aortic stenosis seen on ECHO - Replace lytes as needed  - BMP  Protein-calorie malnutrition, severe RD following. - Continue Ensure TID.  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 US Scrotum for 07/15/2014 noted bilateral hydrocele L>R. Denies pain.  - 2/5 US Scrotum consistent w/ complex L hydrocele  - CTM   Chronic and Stable Problems:  BPH: Continue home Flomax 0.4 mg nightly HTN: Blood pressure 100-140s past 24 hours, Discontinued Amlodipine 5 mg and consider PO diltiazem and uptitrate accordingly for rate control  Hyperkalemia: (resolved on 2/3). BPM tomorrow.   FEN/GI: Regular diet PPx: Lovenox Dispo: Awaiting inpatient psych bed availability   Subjective:  Patient with intermittent chest pain since yesterday.     Objective: Temp:  [97.2 F (36.2 C)-98.4 F (36.9 C)] 98.4 F (36.9 C) (02/11 0527) Pulse Rate:  [72-92] 78 (02/11 0527) Resp:  [16-18] 18 (02/11 0527) BP: (100-127)/(73-78) 127/78 (02/11 0527) SpO2:  [96 %-100 %] 96 % (02/11 0527) Physical Exam: General: NAD, cooperative, Cardiovascular: Tachycardic, 2/6 systolic murmur in RUSB   Respiratory: CTABL, normal WOB Abdomen: Soft, NTTP, non-distended Extremities: moving all extremities, no edema  Laboratory: Most recent CBC Lab Results  Component Value Date   WBC 5.6 02/27/2023   HGB 14.9 02/27/2023   HCT 46.0 02/27/2023   MCV 89.5 02/27/2023   PLT 237 02/27/2023   Most recent BMP  Latest Ref Rng & Units 03/06/2023    1:05 AM  BMP  Glucose 70 - 99 mg/dL 94   BUN 8 - 23 mg/dL 24   Creatinine 9.56 - 1.24 mg/dL 3.87   Sodium 564 - 332 mmol/L 136   Potassium 3.5 - 5.1 mmol/L 4.2   Chloride 98 - 111 mmol/L 106   CO2 22 - 32 mmol/L 23   Calcium 8.9 - 10.3 mg/dL 9.5    EKG sinus w/ PAC, aberrant conduction ST  elevation, nonspecific ST and T wave abnormality   Peterson Ao, MD 03/08/2023, 7:11 AM  PGY-1, [facility] FPTS Intern pager: [phone], text pages welcome Secure chat group [facility] Teaching Service

## 2023-03-08 NOTE — Assessment & Plan Note (Addendum)
Pt reported intermittent chest pain this morning. Denies shortness of breath, diaphoresis, dyspnea or radiating pain. Tachy to 170s. EKG with sinus rhythm w/ PAC. Trops x2 peaked at 4 - IV Diltiazem 10 mg IV x1 - can consider diltiazem drip or PO diltiazem if rates not well controlled  - can consider cardiology consult if worsening symptoms

## 2023-03-08 NOTE — Progress Notes (Signed)
Pt CO CP, VS show HR 170s, EKG completed and shows SVT 170-270. Dr notified. RRT called. 10mg  diltiazem given and pt HR went down to 87, maintaining at 80s-110s.

## 2023-03-08 NOTE — Progress Notes (Signed)
OT Cancellation Note  Patient Details Name: [name] MRN: [id number] DOB: October 05, 1940   Cancelled Treatment:    Reason Eval/Treat Not Completed: Medical issues which prohibited therapy (per RN pt with elevated HR, will check back for OT tx as appropriate)  [name], OTD, OTR/L SecureChat Preferred Acute Rehab [phone]   [name] 03/08/2023, 10:45 AM

## 2023-03-08 NOTE — Progress Notes (Addendum)
Physical Therapy Treatment Patient Details Name: [name] MRN: [id number] DOB: 15-Mar-1940 Today's Date: 03/08/2023   History of Present Illness Pt is an 83 y/o M presenting to ED on 2/2 with "blackout spells" and urinary urgency, admitted for AKI. PMH includes schizoaffective disorder, homelessness, AMS, bipolar, HTN, hep C, BPH    PT Comments  Agreeable to work with physical therapy today. Gait training with and without assistive device, shows improved stability with RW but this does result in increased shuffle/festinating tendencies. Agree SPC may be best option and will plan further treat with this device. Needs redirected at times. States LEs feel a little weak while walking. Patient will continue to benefit from skilled physical therapy services to further improve independence with functional mobility.   HR 102 with activity.   If plan is discharge home, recommend the following: Assistance with cooking/housework;Supervision due to cognitive status   Can travel by private vehicle        Nurse, learning disability    Recommendations for Other Services       Precautions / Restrictions Precautions Precautions: Fall Recall of Precautions/Restrictions: Impaired Restrictions Weight Bearing Restrictions Per Provider Order: No Other Position/Activity Restrictions: HOH     Mobility  Bed Mobility               General bed mobility comments: seated in chair beg./end of session    Transfers Overall transfer level: Needs assistance Equipment used: None, Rolling walker (2 wheels) Transfers: Sit to/from Stand Sit to Stand: Contact guard assist           General transfer comment: CGA for safety, with and without RW, RW provides more support. Cues for awareness with alignment of chair prior to sitting.    Ambulation/Gait Ambulation/Gait assistance: Contact guard assist Gait Distance (Feet): 115 Feet Assistive device: Rolling walker (2 wheels), None Gait  Pattern/deviations: Step-through pattern, Decreased step length - right, Decreased step length - left, Drifts right/left, Shuffle Gait velocity: decreased Gait velocity interpretation: <1.8 ft/sec, indicate of risk for recurrent falls   General Gait Details: Furniture walking in room, mild instability, opted for RW use in hallway. Resulted in increased shuffle and trunk flexed. Picks up RW with turns. Cues for safety, use, and awareness with AD. CGA for safety without overt buckling but shows some festinating tendencies.   Stairs             Wheelchair Mobility     Tilt Bed    Modified Rankin (Stroke Patients Only)       Balance Overall balance assessment: Needs assistance Sitting-balance support: Feet supported Sitting balance-Leahy Scale: Good     Standing balance support: During functional activity, No upper extremity supported Standing balance-Leahy Scale: Fair Standing balance comment: more stable with RW.                            Communication Communication Factors Affecting Communication: Hearing impaired  Cognition Arousal: Alert Behavior During Therapy: Flat affect, Agitated   PT - Cognitive impairments: No family/caregiver present to determine baseline, Problem solving, Memory, Sequencing, Safety/Judgement                         Following commands: Impaired Following commands impaired: Follows one step commands inconsistently    Cueing Cueing Techniques: Verbal cues, Gestural cues, Visual cues  Exercises      General Comments General comments (skin integrity, edema, etc.): Needs redirected at  times.      Pertinent Vitals/Pain Pain Assessment Pain Assessment: No/denies pain    Home Living                          Prior Function            PT Goals (current goals can now be found in the care plan section) Acute Rehab PT Goals Patient Stated Goal: none stated PT Goal Formulation: Patient unable to  participate in goal setting Time For Goal Achievement: 03/14/23 Potential to Achieve Goals: Good Progress towards PT goals: Progressing toward goals    Frequency    Min 1X/week      PT Plan      Co-evaluation              AM-PAC PT "6 Clicks" Mobility   Outcome Measure  Help needed turning from your back to your side while in a flat bed without using bedrails?: None Help needed moving from lying on your back to sitting on the side of a flat bed without using bedrails?: None Help needed moving to and from a bed to a chair (including a wheelchair)?: None Help needed standing up from a chair using your arms (e.g., wheelchair or bedside chair)?: A Little Help needed to walk in hospital room?: A Little Help needed climbing 3-5 steps with a railing? : A Little 6 Click Score: 21    End of Session Equipment Utilized During Treatment: Gait belt Activity Tolerance: Patient tolerated treatment well Patient left: with call bell/phone within reach;in chair;with nursing/sitter in room Psychiatrist) Nurse Communication: Mobility status PT Visit Diagnosis: Unsteadiness on feet (R26.81);Other abnormalities of gait and mobility (R26.89);Other symptoms and signs involving the nervous system (R29.898)     Time: 1213-1221 PT Time Calculation (min) (ACUTE ONLY): 8 min  Charges:    $Gait Training: 8-22 mins PT General Charges $$ ACUTE PT VISIT: 1 Visit                     Kathlyn Sacramento, PT, DPT [facility]  Rehabilitation Services Physical Therapist Office: [phone] Website: [email or web address]    Berton Mount 03/08/2023, 1:28 PM

## 2023-03-08 NOTE — Assessment & Plan Note (Addendum)
Patient has a chronic history of Hydrocele, Per EMR patient been complaining about scrotal edema for several years. US Scrotum for 07/15/2014 noted bilateral hydrocele L>R. Denies pain.  - 2/5 US Scrotum consistent w/ complex L hydrocele  - CTM   Chronic and Stable Problems:  BPH: Continue home Flomax 0.4 mg nightly HTN: Blood pressure 100-140s past 24 hours, Discontinued Amlodipine 5 mg and consider PO diltiazem and uptitrate accordingly for rate control  Hyperkalemia: (resolved on 2/3). BPM tomorrow.   FEN/GI: Regular diet PPx: Lovenox Dispo: Awaiting inpatient psych bed availability

## 2023-03-08 NOTE — Assessment & Plan Note (Addendum)
Stable.  Labs WNL. Consistent with known aortic stenosis seen on ECHO - Replace lytes as needed  - BMP

## 2023-03-08 NOTE — Assessment & Plan Note (Addendum)
Patient more linear and organized with questioning this morning. Agitation improving.  - Psych following, appreciate recs  - Haldol 10 mg qAM and 10 mg at bedtime, Haldol 5 mg PO or IM for agitation q8h as needed  - IVC 2/10 needs renewal on 2/17, recommend inpatient psych once bed available - 1:1 sitter in place  - K > 4 and Mg > 2 - BMP  - TOC consult for inpatient psych placement

## 2023-03-08 NOTE — Assessment & Plan Note (Signed)
RD following. - Continue Ensure TID.  - Continue multivitamin
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
## 2023-03-08 NOTE — Plan of Care (Signed)

## 2023-03-08 NOTE — TOC Progression Note (Signed)
Transition of Care [facility]) - Progression Note    Patient Details  Name: [name] MRN: [id number] Date of Birth: 25-Jan-1941  Transition of [facility]) CM/SW Contact  Erin Sons, [place] Phone Number: 03/08/2023, 12:02 PM  Clinical Narrative:     Pt [facility] for psych placement. Pt also faxed out to outside facilities. Awaiting bed offers  Destination  Service Provider Request Status Services Address Phone Fax Patient Preferred  [facility] Pending - Request Sent -- [address] [place] [place] [address] [phone] --  [facility] Pending - Request Sent -- [address], [place] [address] [phone] --  [facility] P[facility] - Request Sent -- [address] [place] [address] [phone] --  [facility] Patient Placement Pending - Request Sent -- [facility], [place] [phone] --  [facility] Pending - Request Rhina Brackett [place] [address] [phone] --  [facility] Pending - Request Sent -- [address] Regino Bellow [place] [address] [phone] --  [facility] Pending - Request Sent -- [address] [place] [address] [phone] --  [facility] Pending - Request Sent -- [address] [place] [address] [phone] --  [facility] Pending - Request Sent -- [address] [place] [address] [phone] --  [facility] Pending - Request Sent -- [address] [place] [address] [phone] --  [facility] Pending - Request Sent -- [address] [place], [place] [address] [phone] --  [facility] Pending - Request Sent -- [address], [place]  [address] [phone] --  [facility] Pending - Request Sent -- [address] [place], [place] [address] [phone] --  [facility] Pending - Request Sent -- [address] [place] [address] [phone] --  [facility] ([facility]) Pending - Request Sent -- [address] [place] [address] [phone] --  [facility] Pending - Request Sent -- [address] [place] [address] [phone] --  [facility] Pending - Request Sent -- [address] [place] [address] [phone] --  [facility] Pending - Request Sent -- [address] [place] [address] [phone] --  [facility] Pending - Request Sent -- [name] [phone] --  [facility] Pending - Request Sent -- [address], [place] [address] [phone] --  [facility] Pending - Request Rudell Cobb [name] [phone] --  [facility] Pending - Request Sent -- [address], [place] [address] [phone] --  [facility] Pending - Request Sent -- [address] [place], [place] [address] [phone] --  [facility] Pending - Request Sent -- [address] [place] [address] [phone] --  [facility] Pending - Request Sent -- [address] [place] [address] [phone] --  [facility] Pending - Request Sent -- [address] [place] [address] [phone] --  [facility] Pending - Request Sent -- [address] [place] [address] [phone] --  [facility] Pending - Request  Sent -- [address] [place] [address] [phone] --  [facility] Pending - Request Sent -- [address], [place] [address] [phone] --  [facility] Pending - Request Sent -- [address] [place] [address] [phone] --  [facility] Pending - Request Sent -- [place] [phone] --  [facility] Pending - Request Sent -- [address] [place]lou [place] [address] [phone] --  [facility] Pending - Request Sent -- [address] [place] [address] [phone] --  [facility] Pending - Request Sent -- [address] Karolee Ohs [place] [address] [phone] --  [facility] Pending - Request Sent -- [address] Karolee Ohs [place] [phone] --  [facility] Pending - Request Sent -- [address], [place] [address] [phone] --  [facility] Pending - Request Sent -- [address] Rachelle Hora [place] [address] [phone] --  [facility] Pending - Request Sent -- [address], [place] [address] [phone] --  [facility] Pending - Request Sent -- [address], [place] [address] [phone] --  [facility] Pending - Request Sent -- [address], [place] [address] [phone] --  [facility] Pending - Request Sent -- [address], [place] [address] [phone] --  [facility] Pending - Request Sent -- [address] [place] [address] [phone] --  [facility] ([facility] Pending - Request Sent -- [address] [place] [address] [phone] --  [facility] Pending - Request Sent -- [address] [place] [address] [phone] --  [facility] Pending - Request Sent -- [address], [place] [address] [phone] --  [facility] Pending - Request Sent -- [address] [place] [address] [phone] --  [facility] Pending - Request Sent -- [place] [phone] --  [facility] Pending - Request Sent -- [address] [place], [place] [address] [phone] --  [facility] Pending - Request Sent -- [address] [place] [address] [phone] --  [facility] Pending - Request Sent -- [address] [place] [address] [phone]     Expected Discharge Plan: Psychiatric Hospital Barriers to Discharge: Psych Bed not available  Expected Discharge Plan and Services In-house Referral: Clinical Social Work     Living arrangements for the past 2 months: Single Family Home, Homeless Shelter                                       Social Determinants of Health (SDOH) Interventions SDOH Screenings   Food Insecurity: Food Insecurity Present (02/28/2023)  Housing: High Risk (02/28/2023)  Transportation Needs: Patient Unable To Answer (02/28/2023)  Utilities: Not At Risk (02/28/2023)  Social Connections: Patient Declined (02/28/2023)  Tobacco Use: Medium Risk (02/27/2023)    Readmission Risk Interventions     No data to display

## 2023-03-08 NOTE — Plan of Care (Addendum)
FMTS Interim Progress Note  S: To bedside after RN message that patient is persistently tachycardic and complaining of chest pain. Pt unable to verbalize clearly to me but denies chest pain when I point to his chest.  O: BP 90/69   Pulse (!) 172   Temp 98.4 F (36.9 C)   Resp 20   Ht 5\' 8"  (1.727 m)   Wt 80 kg   SpO2 100%   BMI 26.82 kg/m    Gen: NAD, laying in bed. Awake and alert but only intermittently responds to questioning CV: Tachycardic, regular rhythm Pulm: No respiratory distress, CTAB, on room air Abd: Soft NTND  A/P:  Tachycardia, ?paroxysmal SVT Chest pain  -EKG -F/u repeat Trop, WNL at 0828 so doubt ACS -IV Diltiazem push then monitor HR. Consider gtt if persistent    See full progress note for remainder of plan   Vonna Drafts, MD 03/08/2023, 10:58 AM PGY-2, [facility] Family Medicine Service pager [phone]

## 2023-03-09 ENCOUNTER — Ambulatory Visit: Payer: Self-pay | Admitting: Licensed Clinical Social Worker

## 2023-03-09 DIAGNOSIS — F250 Schizoaffective disorder, bipolar type: Secondary | ICD-10-CM | POA: Diagnosis not present

## 2023-03-09 DIAGNOSIS — R451 Restlessness and agitation: Secondary | ICD-10-CM | POA: Diagnosis not present

## 2023-03-09 DIAGNOSIS — R000 Tachycardia, unspecified: Secondary | ICD-10-CM | POA: Diagnosis not present

## 2023-03-09 DIAGNOSIS — N179 Acute kidney failure, unspecified: Secondary | ICD-10-CM | POA: Diagnosis not present

## 2023-03-09 LAB — URINALYSIS, ROUTINE W REFLEX MICROSCOPIC
Bilirubin Urine: NEGATIVE
Glucose, UA: NEGATIVE mg/dL
Hgb urine dipstick: NEGATIVE
Ketones, ur: 5 mg/dL — AB
Nitrite: NEGATIVE
Protein, ur: NEGATIVE mg/dL
Specific Gravity, Urine: 1.023 (ref 1.005–1.030)
WBC, UA: >50 WBC/hpf (ref 0–5)
pH: 5 (ref 5.0–8.0)

## 2023-03-09 LAB — BASIC METABOLIC PANEL
Anion gap: 7 (ref 5–15)
BUN: 27 mg/dL — ABNORMAL HIGH (ref 8–23)
CO2: 23 mmol/L (ref 22–32)
Calcium: 9.8 mg/dL (ref 8.9–10.3)
Chloride: 108 mmol/L (ref 98–111)
Creatinine, Ser: 1.18 mg/dL (ref 0.61–1.24)
GFR, Estimated: >60 mL/min (ref >60)
Glucose, Bld: 94 mg/dL (ref 70–99)
Potassium: 4.3 mmol/L (ref 3.5–5.1)
Sodium: 138 mmol/L (ref 135–145)

## 2023-03-09 MED ORDER — ACETAMINOPHEN 325 MG PO TABS: 650.0000 mg | ORAL_TABLET | Freq: Four times a day (QID) | ORAL | Status: DC | PRN

## 2023-03-09 MED ORDER — DILTIAZEM HCL ER COATED BEADS 180 MG PO CP24
180.0000 mg | ORAL_CAPSULE | Freq: Every day | ORAL | Status: DC
  Administered 2023-03-09 – 2023-03-10 (×2): 180 mg via ORAL
  Filled 2023-03-09 (×2): qty 1

## 2023-03-09 MED ORDER — HALOPERIDOL 5 MG PO TABS: 5.0000 mg | ORAL_TABLET | Freq: Three times a day (TID) | ORAL | Status: DC | PRN

## 2023-03-09 MED ORDER — VITAMIN B-1 100 MG PO TABS: 100.0000 mg | ORAL_TABLET | Freq: Every day | ORAL | Status: DC

## 2023-03-09 MED ORDER — HALOPERIDOL LACTATE 5 MG/ML IJ SOLN: 5.0000 mg | Freq: Three times a day (TID) | INTRAMUSCULAR | Status: DC | PRN

## 2023-03-09 MED ORDER — ORAL CARE MOUTH RINSE: 15.0000 mL | OROMUCOSAL | Status: DC | PRN

## 2023-03-09 MED ORDER — ADULT MULTIVITAMIN W/MINERALS CH: 1.0000 | ORAL_TABLET | Freq: Every day | ORAL | Status: DC

## 2023-03-09 MED ORDER — ACETAMINOPHEN 650 MG RE SUPP: 650.0000 mg | Freq: Four times a day (QID) | RECTAL | Status: DC | PRN

## 2023-03-09 MED ORDER — HALOPERIDOL 10 MG PO TABS: 10.0000 mg | ORAL_TABLET | Freq: Two times a day (BID) | ORAL | Status: DC

## 2023-03-09 MED ORDER — ENSURE ENLIVE PO LIQD: 237.0000 mL | Freq: Two times a day (BID) | ORAL | Status: AC

## 2023-03-09 MED ORDER — DILTIAZEM HCL ER COATED BEADS 180 MG PO CP24: 180.0000 mg | ORAL_CAPSULE | Freq: Every day | ORAL | Status: DC

## 2023-03-09 NOTE — Plan of Care (Signed)

## 2023-03-09 NOTE — Progress Notes (Signed)
Occupational Therapy Treatment Patient Details Name: [name] MRN: [id number] DOB: September 30, 1940 Today's Date: 03/09/2023   History of present illness Pt is an 83 y/o M presenting to ED on 2/2 with "blackout spells" and urinary urgency, admitted for AKI. PMH includes schizoaffective disorder, homelessness, AMS, bipolar, HTN, hep C, BPH   OT comments  Pt progressing towards goals able to stand for LB ADL and grooming tasks at sink with up to minA. Pt CGA for transfers with RW, needs cues for safety/redirection due to HOH/cognition. Pt presenting with impairments listed below, will follow acutely to maximize safety/ind with ADLs/functional mobility.      If plan is discharge home, recommend the following:  A little help with walking and/or transfers;A little help with bathing/dressing/bathroom;Direct supervision/assist for medications management;Assist for transportation;Direct supervision/assist for financial management;Help with stairs or ramp for entrance;Assistance with cooking/housework   Equipment Recommendations  None recommended by OT    Recommendations for Other Services PT consult    Precautions / Restrictions Precautions Precautions: Fall Recall of Precautions/Restrictions: Impaired Restrictions Weight Bearing Restrictions Per Provider Order: No Other Position/Activity Restrictions: HOH       Mobility Bed Mobility               General bed mobility comments: seated in chair beg./end of session    Transfers Overall transfer level: Needs assistance Equipment used: None, Rolling walker (2 wheels) Transfers: Sit to/from Stand Sit to Stand: Contact guard assist                 Balance Overall balance assessment: Needs assistance Sitting-balance support: Feet supported Sitting balance-Leahy Scale: Good     Standing balance support: During functional activity, No upper extremity supported Standing balance-Leahy Scale: Fair Standing balance comment:  more stable with RW.                           ADL either performed or assessed with clinical judgement   ADL Overall ADL's : Needs assistance/impaired     Grooming: Standing;Oral care;Contact guard assist Grooming Details (indicate cue type and reason): brushing teeth at sink             Lower Body Dressing: Minimal assistance Lower Body Dressing Details (indicate cue type and reason): pulling up pants from mid thigh level Toilet Transfer: Contact guard assist;Ambulation;Regular Toilet;Rolling walker (2 wheels) Toilet Transfer Details (indicate cue type and reason): simulated via functional mobility         Functional mobility during ADLs: Contact guard assist;Rolling walker (2 wheels)      Extremity/Trunk Assessment Upper Extremity Assessment Upper Extremity Assessment: Generalized weakness   Lower Extremity Assessment Lower Extremity Assessment: Defer to PT evaluation        Vision   Additional Comments: impaired vision but can functionally navigate environment and read larger text   Perception Perception Perception: Not tested   Praxis Praxis Praxis: Not tested   Communication Communication Factors Affecting Communication: Hearing impaired   Cognition Arousal: Alert Behavior During Therapy: Flat affect, Impulsive Cognition: No family/caregiver present to determine baseline             OT - Cognition Comments: pt with HOH, mod cues/repeitiotn for command follow throughout session, paranoid behavior, does not let therapist hold mouthwash bottle so he can place both hands on RW                 Following commands: Impaired Following commands impaired: Follows one step commands  inconsistently      Cueing   Cueing Techniques: Verbal cues, Gestural cues, Visual cues  Exercises      Shoulder Instructions       General Comments VSS    Pertinent Vitals/ Pain       Pain Assessment Pain Assessment: Faces Pain Score: 4  Faces  Pain Scale: Hurts little more Pain Location: R knee Pain Descriptors / Indicators: Aching Pain Intervention(s): Limited activity within patient's tolerance, Monitored during session, Repositioned  Home Living                                          Prior Functioning/Environment              Frequency  Min 1X/week        Progress Toward Goals  OT Goals(current goals can now be found in the care plan section)  Progress towards OT goals: Progressing toward goals  Acute Rehab OT Goals Patient Stated Goal: none stated OT Goal Formulation: With patient Time For Goal Achievement: 03/14/23 Potential to Achieve Goals: Good ADL Goals Pt Will Perform Upper Body Dressing: Independently;sitting Pt Will Perform Lower Body Dressing: Independently;sitting/lateral leans;sit to/from stand Pt Will Transfer to Toilet: Independently;ambulating;regular height toilet Pt Will Perform Tub/Shower Transfer: Tub transfer;Shower transfer;Independently;ambulating Additional ADL Goal #1: pt will follow 3 step command with min cues in prep for ADLs  Plan      Co-evaluation                 AM-PAC OT "6 Clicks" Daily Activity     Outcome Measure   Help from another person eating meals?: None Help from another person taking care of personal grooming?: A Little Help from another person toileting, which includes using toliet, bedpan, or urinal?: A Little Help from another person bathing (including washing, rinsing, drying)?: A Little Help from another person to put on and taking off regular upper body clothing?: A Little Help from another person to put on and taking off regular lower body clothing?: A Little 6 Click Score: 19    End of Session Equipment Utilized During Treatment: Gait belt;Rolling walker (2 wheels)  OT Visit Diagnosis: Unsteadiness on feet (R26.81);Other abnormalities of gait and mobility (R26.89);Muscle weakness (generalized) (M62.81)   Activity  Tolerance Patient tolerated treatment well   Patient Left in chair;with call bell/phone within reach;with nursing/sitter in room   Nurse Communication Mobility status        Time: 2841-3244 OT Time Calculation (min): 15 min  Charges: OT General Charges $OT Visit: 1 Visit OT Treatments $Self Care/Home Management : 8-22 mins  Adrian Fila, OTD, OTR/L SecureChat Preferred Acute Rehab [phone]   [name] 03/09/2023, 11:53 AM

## 2023-03-09 NOTE — Assessment & Plan Note (Addendum)
Pt reported intermittent chest pain yesterday morning and improved after IV Diltiazem 10 mg IV x1. Denies shortness of breath, diaphoresis, dyspnea or radiating pain. HR 77-126. Blood pressure 90s-130s past 24 hours. Trops x2 peaked at 4. Patient been relatively normotensive the past few days, will start on Dilt for rate control.  - Start Diltiazam 180 mg daily

## 2023-03-09 NOTE — Assessment & Plan Note (Addendum)
Patient linear and organized with questioning this morning.  - Psych following, appreciate recs  - Haldol 10 mg qAM and 10 mg at bedtime, Haldol 5 mg PO or IM for agitation q8h as needed  - IVC 2/10 needs renewal on 2/17, patient faxed out for inpatient psych bed, will continue to assess for improvement to determine appropriate dispo - 1:1 sitter in place  - [facility] consult for inpatient psych placement

## 2023-03-09 NOTE — Assessment & Plan Note (Signed)
RD following. - Continue Ensure TID.  - Continue multivitamin

## 2023-03-09 NOTE — Assessment & Plan Note (Signed)
Stable. Labs WNL. Consistent with known aortic stenosis seen on ECHO - Replace lytes as needed

## 2023-03-09 NOTE — Assessment & Plan Note (Addendum)
Patient has a chronic history of Hydrocele, Per EMR patient been complaining about scrotal edema for several years. US Scrotum for 07/15/2014 noted bilateral hydrocele L>R. Denies pain.  - 2/5 US Scrotum consistent w/ complex L hydrocele  - CTM   Chronic and Stable Problems:  BPH: Continue home Flomax 0.4 mg nightly Hyperkalemia: (resolved on 2/3). BPM unremarkable, K 4.3  FEN/GI: Regular diet PPx: Lovenox Dispo: Awaiting inpatient psych bed availability

## 2023-03-09 NOTE — Patient Outreach (Signed)
  Care Coordination   03/09/2023 Name: [name] MRN: [id number] DOB: 1940/03/25   Care Coordination Outreach Attempts:  An unsuccessful telephone outreach was attempted today to offer the patient information about available complex care management services.   Patient currently inpatient at [facility]   Follow Up Plan:  Additional outreach attempts will be made to offer the patient complex care management information and services.   Encounter Outcome:  No Answer   Care Coordination Interventions:  No, not indicated    Gwyndolyn Saxon MSW, LCSW Licensed Clinical Social Worker  [facility], Population Health Direct Dial: [phone]  Fax: [phone]

## 2023-03-09 NOTE — Discharge Summary (Shared)
Family Medicine Teaching [facility] Discharge Summary  Patient name: [name] Medical record number: [id number] Date of birth: [birth year] Age: 83 y.o. Gender: male Date of Admission: 02/27/2023  Date of Discharge: 03/10/23  Admitting Physician: Lincoln Brigham, MD  Primary Care Provider: [facility] Consultants: Psychiatry   Indication for Hospitalization: AKI and UTI  Discharge Diagnoses/Problem List:  Principal Problem for Admission: UTI Other Problems addressed during stay:  Active Problems:   Schizoaffective disorder, bipolar type (HCC)   Schizophrenia, chronic condition with acute exacerbation (HCC)   Murmur   Protein-calorie malnutrition, severe   Hydrocele   Agitation   Tachycardia   Abnormal urinalysis  Brief Hospital Course:  [name] is a 83 y.o.male with a history of past medical history of schizoaffective disorder, bipolar type noncompliant on home meds, HTN, hepatitis C, hearing loss, BPH and housing insecurity presenting with dizzy spells, gait ataxia, and disorganized thinking to the [facility] Medicine Teaching Service at [facility]. His hospital course is detailed below:  UTI Increased urgency, UA positive for leukocytes and nitrites and he was given a dose of ceftriaxone in the ED.  Urine culture positive for E Coli and he transitioned to Cefadroxil 500 mg BID (2/2-2/7) and completed the course. Repeat UA was dirty with leukocytes and bacteria. Patient was not prescribed any further antibiotics after completing course prior to discharge.   Schizoaffective Disorder, Bipolar Type Presented to the ED tangential and disorganized, likely secondary to medication non-compliance in the setting of running out of meds.  He was restarted back on Haldol 10 mg at bedtime, with agitation meds ordered as needed.  Psychiatry was consulted and adjusted medications throughout admission.  Patient became intermittently agitated during admission, requiring IM Haldol and  IV Ativan.  Patient was given Haldol 10 mg BID. Patient was amenable with Haldol decanoate prior to discharge. He was transferred to [facility] for further psychiatric treatment.   Murmur - Mild aortic stenosis Systolic murmur noted in RUSB. Echo demonstrated EF 60-65%, moderate LVH, mild MVR, and moderate calcification of the aortic valve and mild AVR and stenosis.  He has significant scrotal edema and some LE edema while sitting.  Given Lasix 20 mg prn to help with fluid overload.  His electrolytes were monitored with BMP and electrolytes were replete as needed. Repeat echo in 2-3 years to monitor valvular disease.  Chest Pain/SVT Pt complained of intermittent chest pain on 2/10 and 2/11.  Trops were negative and peaked at 8. EKG was suspicious for SVT with HRs into the 170s.  He was given IV Diltiazem 10 mg x1 and vitals stabilized. Patient was discharged on Diltiazem 180 mg daily.   AKI Cr elevated to 1.32 on arrival improved after 1L NS bolus on admission to 1.01.  PO hydration was encouraged.  The patient's Cr was monitored and was back at his baseline prior to discharge.   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  US Scrotum for 07/15/2014 noted bilateral hydrocele L>R.  Denies pain.  2/5 US Scrotum consistent w/ complex L hydrocele.  PCP Follow-up Recommendations: Repeat Echo in 2-3 years d/t aortic valve stenosis. Consider Urology Referral for scrotal edema  Follow-up compliance with psychiatric medications after discharge.  Disposition: Inpatient Psych  Discharge Condition: Stable  Discharge Exam:  Vitals:   03/10/23 1517 03/10/23 1522  BP: (!) 124/56 (!) 124/56  Pulse: 84 84  Resp: 18 18  Temp: 98.8 F (37.1 C) 98.8 F (37.1 C)  SpO2: 100% 100%   Performed by Dr. Birdena Crandall: General: NAD, cooperative, Cardiovascular: Tachycardic, 2/6 systolic murmur in RUSB   Respiratory: CTABL, normal WOB Abdomen: Soft, NTTP, non-distended Extremities: moving  all extremities, no edema  Significant Procedures: None  Significant Labs and Imaging:  No results for input(s): "WBC", "HGB", "HCT", "PLT" in the last 48 hours. Recent Labs  Lab 03/09/23 0616  NA 138  K 4.3  CL 108  CO2 23  GLUCOSE 94  BUN 27*  CREATININE 1.18  CALCIUM 9.8   CT Head w/o contrast and CT Cevical Spine w/o contrast 1. No acute intracranial abnormality. 2. No acute fracture or traumatic malalignment of the cervical spine. 3. 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  Repeat CT Head w/o contrast 1. No acute intracranial findings. 2. 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  US Scrotum 1. Complex left hydrocele, unchanged. 2. Nonvisualization of the left testicle and epididymis, as before.  Results/Tests Pending at Time of Discharge: UA  Discharge Medications:  Allergies as of 03/10/2023   No Known Allergies      Medication List     STOP taking these medications    hydrochlorothiazide 12.5 MG tablet Commonly known as: HYDRODIURIL       TAKE these medications    acetaminophen 325 MG tablet Commonly known as: TYLENOL Take 2 tablets (650 mg total) by mouth every 6 (six) hours as needed for mild pain (pain score 1-3) (or Fever >/= 101).   acetaminophen 650 MG suppository Commonly known as: TYLENOL Place 1 suppository (650 mg total) rectally every 6 (six) hours as needed for mild pain (pain score 1-3) (or Fever >/= 101).   diltiazem 180 MG 24 hr capsule Commonly known as: CARDIZEM CD Take 1 capsule (180 mg total) by mouth daily.   feeding supplement Liqd Take 237 mLs by mouth 2 (two) times daily between meals.   haloperidol 10 MG tablet Commonly known as: HALDOL Take 1 tablet (10 mg total) by mouth 2 (two) times daily. What changed: when to take this   haloperidol decanoate 100 MG/ML injection Commonly known as: HALDOL DECANOATE Inject 1 mL (100 mg total) into the muscle once a week for 7 days.  Pt received 1st IM injection on 2/13, needs to receive 2nd IM injection Start taking on: March 17, 2023   mouth rinse Liqd solution 15 mLs by Mouth Rinse route as needed (for oral care).   multivitamin with minerals Tabs tablet Take 1 tablet by mouth daily.   tamsulosin 0.4 MG Caps capsule Commonly known as: FLOMAX Take 1 capsule (0.4 mg total) by mouth daily.   thiamine 100 MG tablet Commonly known as: Vitamin B-1 Take 1 tablet (100 mg total) by mouth daily.        Discharge Instructions: Please refer to Patient Instructions section of EMR for full details.  Patient was counseled important signs and symptoms that should prompt return to medical care, changes in medications, dietary instructions, activity restrictions, and follow up appointments.   Follow-Up Appointments:  Peterson Ao, MD 03/10/2023, 2:39 PM PGY-1, [facility]   Upper Level Addendum:   I have reviewed the above note, making necessary revisions as appropriate.  I agree with the medical decision making and physical exam as noted above.   Elberta Fortis, DO PGY-2, [facility] Family Medicine Residency

## 2023-03-09 NOTE — Progress Notes (Cosign Needed)
Daily Progress Note Intern Pager: [phone]  Patient name: [name] Medical record number: [id number] Date of birth: [birth year] Age: 83 y.o. Gender: male  Primary Care Provider: [facility] Consultants: Psych Code Status: Full Code   Pt Overview and Major Events to Date:  2/2: Admitted to FMTS 2/3: Psych consult placed 2/4: Psych recs creased Haldol to 15 mg at bedtime 2/5: Ultrasound scrotum with left complex hydrocele, chronic issue 2/7: Psych recs: increase Haldol 15 mg --> 20 mg at bedtime.  2/8: Psych recs: Haldol 10 mg qAM and 10 mg at bedtime  2/10: IVC intiated   Assessment and Plan:  Mr. Reger is an 83 year old male with a past medical history of schizoaffective disorder, bipolar type noncompliant on home meds, hypertension, hep C, hearing loss, BPH and housing insecurity presenting with dizzy spells and disorganized thinking and admitted for AKI and UTI which have now resolved. Awaiting psych bed availability, pending medical stability.   Assessment & Plan Schizoaffective disorder, bipolar type (HCC) Patient linear and organized with questioning this morning.  - Psych following, appreciate recs  - Haldol 10 mg qAM and 10 mg at bedtime, Haldol 5 mg PO or IM for agitation q8h as needed  - IVC 2/10 needs renewal on 2/17, patient faxed out for inpatient psych bed, will continue to assess for improvement to determine appropriate dispo - 1:1 sitter in place  - [facility] consult for inpatient psych placement  Chest pain Pt reported intermittent chest pain yesterday morning and improved after IV Diltiazem 10 mg IV x1. Denies shortness of breath, diaphoresis, dyspnea or radiating pain. HR 77-126. Blood pressure 90s-130s past 24 hours. Trops x2 peaked at 4. Patient been relatively normotensive the past few days, will start on Dilt for rate control.  - Start Diltiazam 180 mg daily   Murmur Stable. Labs WNL. Consistent with known aortic stenosis seen on ECHO -  Replace lytes as needed  Protein-calorie malnutrition, severe RD following. - Continue Ensure TID.  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 US Scrotum for 07/15/2014 noted bilateral hydrocele L>R. Denies pain.  - 2/5 US Scrotum consistent w/ complex L hydrocele  - CTM   Chronic and Stable Problems:  BPH: Continue home Flomax 0.4 mg nightly Hyperkalemia: (resolved on 2/3). BPM unremarkable, K 4.3  FEN/GI: Regular diet PPx: Lovenox Dispo: Awaiting inpatient psych bed availability   Subjective:  Patient denies chest pain this morning. States he's in the hospital for schizophrenia and kidney. Calm, cooperative and able to converse much better today.   Objective: Temp:  [98.1 F (36.7 C)-98.6 F (37 C)] 98.1 F (36.7 C) (02/12 0620) Pulse Rate:  [55-172] 78 (02/12 0620) Resp:  [16-24] 16 (02/12 0620) BP: (90-130)/(65-94) 99/73 (02/12 0620) SpO2:  [98 %-100 %] 100 % (02/12 4132) Physical Exam: General: NAD, cooperative, Cardiovascular: RRR, 2/6 systolic murmur in RUSB   Respiratory: CTABL, normal WOB Abdomen: Soft, NTTP, non-distended Extremities: moving all extremities, no edema  Laboratory: Most recent CBC Lab Results  Component Value Date   WBC 5.6 02/27/2023   HGB 14.9 02/27/2023   HCT 46.0 02/27/2023   MCV 89.5 02/27/2023   PLT 237 02/27/2023   Most recent BMP    Latest Ref Rng & Units 03/06/2023    1:05 AM  BMP  Glucose 70 - 99 mg/dL 94   BUN 8 - 23 mg/dL 24   Creatinine 4.40 -  1.24 mg/dL 8.29   Sodium 562 - 130 mmol/L 136   Potassium 3.5 - 5.1 mmol/L 4.2   Chloride 98 - 111 mmol/L 106   CO2 22 - 32 mmol/L 23   Calcium 8.9 - 10.3 mg/dL 9.5     Imaging/Diagnostic Tests: None   Peterson Ao, MD 03/09/2023, 8:03 AM  PGY-1, [facility] FPTS Intern pager: [phone], text pages welcome Secure chat group [facility] Teaching Service

## 2023-03-09 NOTE — TOC Progression Note (Signed)
Transition of Care [facility]) - Progression Note    Patient Details  Name: [name] MRN: [id number] Date of Birth: 1940/06/16  [facility] CM/SW Contact  Erin Sons, [place] Phone Number: 03/09/2023, 4:40 PM  Clinical Narrative:     Pt has bed at [facility] pending covid swab and UA. CSW called sheriffs office to inquire about transport later tonight and is informed that they will be unable to transport pt tonight due to having too many transports already. CSW called [employer] and informed they will not transport out of county. CSW notified treatment team.   Expected Discharge Plan: Psychiatric Hospital Barriers to Discharge: Psych Bed not available  Expected Discharge Plan and Services In-house Referral: Clinical Social Work     Living arrangements for the past 2 months: Single Family Home, Homeless Shelter                                       Social Determinants of Health (SDOH) Interventions SDOH Screenings   Food Insecurity: Food Insecurity Present (02/28/2023)  Housing: High Risk (02/28/2023)  Transportation Needs: Patient Unable To Answer (02/28/2023)  Utilities: Not At Risk (02/28/2023)  Social Connections: Patient Declined (02/28/2023)  Tobacco Use: Medium Risk (02/27/2023)    Readmission Risk Interventions     No data to display

## 2023-03-09 NOTE — Consult Note (Signed)
[facility] Consult Follow-up  Patient Name: .[name]  MRN: [id number]  DOB: 1940-06-12  Consult Order details:  Orders (From admission, onward)     Start     Ordered   02/28/23 1139  IP CONSULT TO PSYCHIATRY       Comments: He can be seen tomorrow  Ordering Provider: Peterson Ao, MD  Provider:  (Not yet assigned)  Question Answer  [facility]  Reason for Consult? 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 male admitted: Medically for 02/27/2023  9:59 AM for acute kidney injury, altered mental status. He carries the psychiatric diagnoses of schizophrenia and has a past medical history of protein-calorie malnutrition, neurosyphilis, hearing loss, hepatitis C, anemia, BPH, and repeated urinary tract infections in a male.   His current presentation of altered mental status is most consistent with schizoaffective disorder with medication non-adherence. He meets criteria for schizophrenia based on historical chart data, current disorganization.  Current outpatient psychotropic medications include haldol 10 mg QHS and historically he has had a fair response to these medications. He was not compliant with medications prior to admission as evidenced by current disorganization and patient report. We have increased his dosage while inpatient to haldol 10 mg BID.  Despite the patient's continuing disorganization, somewhat reluctant to increase the haloperidol in the presence of his other medical conditions.  Also possible for the urinary tract infection that has returned may be contributing to his mental status.  Please see plan below for  detailed recommendations.   Diagnoses:  Active Hospital problems: Active Problems:   Schizoaffective disorder, bipolar type (HCC)   Schizophrenia, chronic condition with acute exacerbation (HCC)   Murmur   Protein-calorie malnutrition, severe   Hydrocele   Chest pain   Agitation   Tachycardia    Plan   ## Psychiatric Medication Recommendations:  -- Continue haloperidol 10 mg BID for schizophrenia -- Once patient stabilizes on a dose of haloperidol, recommend transitioning him back to haloperidol decanoate.  ## Medical Decision Making Capacity:  Patient currently lacks capacity due to altered mental status  ## Further Work-up:  While pt on Qtc prolonging medications, please monitor & replete K+ to 4 and Mg2+ to 2 -- most recent EKG on 2/11 had QtC of 441 (and SVT) -- Pertinent labwork reviewed earlier this admission includes: TSH (WNL), B12 (1942-H), UA (consistent with UTI), improving GFR   ## Disposition:-- We recommend inpatient psychiatric hospitalization after medical hospitalization. Patient has been involuntarily committed on 03/07/2023.  [facility]. He will be reviewed by their staff for admission. He said that he would prefer the [facility] hospital in [place] or [place], but they have no beds.   ## Behavioral / Environmental: -Patient would benefit from more frequent contact with medical team to delineate plan of care and allow for clarification questions, which will help alleviate anxiety regarding treatment. If possible, try to check back in with the pt in the afternoon.    ## Safety and Observation Level:  - Based on my clinical evaluation, I estimate the patient to be  at moderate risk of self harm and harm to others in the current setting due to ongoing psychotic behavior - At this time, we recommend  routine. This decision is based on my review of the chart including patient's history and current presentation, interview of the patient, mental  status examination, and consideration of suicide risk including evaluating suicidal ideation, plan, intent, suicidal or self-harm behaviors, risk factors, and protective factors. This judgment is based on our ability to directly address suicide risk, implement suicide prevention strategies, and develop a safety plan while the patient is in the clinical setting. Please contact our team if there is a concern that risk level has changed.  CSSR Risk Category:C-SSRS RISK CATEGORY: No Risk  Suicide Risk Assessment: Patient has following modifiable risk factors for suicide: under treated depression , recklessness, and medication noncompliance, which we are addressing by restarting medication, recommending inpatient psychiatric stay once medically stabilized. Patient has following non-modifiable or demographic risk factors for suicide: male gender, history of self harm behavior, and psychiatric hospitalization Patient has the following protective factors against suicide: Supportive friends, Cultural, spiritual, or religious beliefs that discourage suicide, and Frustration tolerance  Thank you for this consult request. Recommendations have been communicated to the primary team.  We will follow at this time.   Margaretmary Dys, MD       History of Present Illness  Relevant Aspects of Hospital Course:  Admitted on 02/27/2023 for feeling faint, AMS; found to have AKI and acute cystitis without hematuria. On 2/11, pt found to have run of SVT with ST depressions on EKG. Serial troponin found to be negative.  Patient Report:  Reviewed medical administration, patient has been taking his haloperidol orally.  Patient twice today both times in the morning second time with attending physician Dr. Margaretha Seeds.  Patient sometimes grows agitated in the presence of women, he reported that he could not understand what they were saying.  Witnessed this firsthand with 3 separate women.  Patient continues  to have difficulty understanding the note writer, however he does understand when her volume is increased and mask is lowered.  Patient grew more agitated and reiterated his previous complaints that he was all here because he had been falsely accused of a robbery, had been forced to plead guilty, was sent to prison in [place] for 15 years."  Patient still seems to have paranoia about masks and reiterated his previous statement that they "interfere with internal healing."  patient was oriented to the name of the notewriter, the location, and the situation, but had waxing and waning level of attention.   Psych ROS:  Depression: Patient unable to focus long enough to answer this question Anxiety: Denies significant symptoms of worry, but patient affect was anxious. Mania (lifetime and current): Patient denies a history of mania. Unable to remember how much he's been sleeping lately.  Psychosis: (lifetime and current): Patient is not actively responding to internal stimuli but grew fairly agitated during the second interview and brought up his recurring delusions as above.  He denied the auditory and visual hallucinations he had reported on previous days.  Collateral information:  Alona Bene (Sister) [phone] (Mobile) on 03/01/2023 at 12:10 pm. No answer. Second attempt to obtain collateral information from patient's sister [name] (Sister) [phone] [facility]) on 03/06/2023 at 2:35 pm resulted to no answer.  Review of Systems  Constitutional:  Negative for chills, fever, malaise/fatigue and weight loss.  HENT:  Positive for hearing loss.   Eyes:  Positive for  blurred vision.  Respiratory:  Negative for cough.   Cardiovascular:  Positive for chest pain.  Neurological:  Negative for headaches.  Psychiatric/Behavioral:  Positive for depression, hallucinations and memory loss. Negative for substance abuse and suicidal ideas. The patient is nervous/anxious. The patient does not have  insomnia.      Psychiatric and Social History  Psychiatric History:  Information collected from chart  Prev Dx/Sx: schizophrenia vs schizoaffective disorder, bipolar type Current Psych Provider: [facility] Home Meds (current): haloperidol 10 mg at bedtime (non compliant) and haloperidol decanoate, most recent injection 01/18/23 Previous Med Trials: ziprasidone, risperidone, ativan, zolplidem Therapy: not currently  Prior Psych Hospitalization: last 12/2022, possible others at [facility] hospital. Prior Self Harm:  Prior Violence: reports false imprisonment for armed robbery in past  Family Psych History: Patient not able to confirm Family Hx suicide: Patient not able to confirm  Social History:  Developmental Hx: Patient not able to confirm Educational Hx: Patient not able to confirm Occupational Hx: Patient not able to confirm Legal Hx: "wrongful arrest for armed robbery" Not recent. Not pending. Living Situation: homeless, but leaves thing at his sister's house Spiritual Hx: Emergency planning/management officer Hx: [employer] during [place] war Access to weapons/lethal means: denies   Substance History Alcohol: does not drink, pt too disorganized to complete review of substances. Tobacco: 06/11/1955 - 06/11/1975; Smoked an average of 1 pack/day for 20.0 years  Illicit drugs: per chart, this does not appear to be a significant problem for him. Prescription drug abuse: denies Rehab hx: denies  Exam Findings  Physical Exam: Pt is disheveled, and still malodorous. He is Transport planner.  He was sitting upright in a recliner and at several points nearly stood up when he grew agitated.  He seemed to think better of it and would sit back down.  Vital Signs:  Temp:  [98.1 F (36.7 C)-98.6 F (37 C)] 98.1 F (36.7 C) (02/12 0620) Pulse Rate:  [55-172] 93 (02/12 0857) Resp:  [16-24] 18 (02/12 0857) BP: (90-130)/(65-94) 116/66 (02/12 0857) SpO2:  [98 %-100 %] 100 % (02/12 0857) Blood pressure 116/66, pulse  93, temperature 98.1 F (36.7 C), temperature source Oral, resp. rate 18, height 5\' 8"  (1.727 m), weight 80 kg, SpO2 100%. Body mass index is 26.82 kg/m.  Physical Exam Vitals and nursing note reviewed.  Constitutional:      General: He is not in acute distress.    Appearance: He is ill-appearing.  HENT:     Head: Normocephalic and atraumatic.  Skin:    General: Skin is warm and dry.  Neurological:     Mental Status: He is disoriented.  Psychiatric:        Attention and Perception: Perception normal. He is inattentive.        Mood and Affect: Mood is depressed. Affect is inappropriate.        Speech: Speech is rapid and pressured and tangential.        Behavior: Behavior is uncooperative, agitated and hyperactive.        Thought Content: Thought content is paranoid and delusional. Thought content does not include homicidal or suicidal ideation.        Cognition and Memory: Cognition is impaired. Memory is impaired. He exhibits impaired recent memory.        Judgment: Judgment is impulsive.     Mental Status Exam: General Appearance: Disheveled, poorly groomed, dressed in layers of clothes.   Orientation:  Other:  Physician and person, not day, place, or situation  Memory:  Immediate;   Poor Recent;   Fair Remote;   Poor  Concentration:  Concentration: Poor and Attention Span: Poor  Recall:  Poor  Attention  Fair  Eye Contact:  Good (staring)  Speech:  Normal Rate  Language:  Fair  Volume:  Increased (hard of hearing)  Mood: "I am frustrated that y'all have me in here for a robbery I didn't commit"  Affect:  Congruent and irritable  Thought Process:  Disorganized   Thought Content:  Illogical and Tangential  Suicidal Thoughts:   unable to obtain   Homicidal Thoughts:   unable to obtain   Judgment:  Poor  Insight:  Lacking  Psychomotor Activity:  Normal  Akathisia:  No  Fund of Knowledge:  Poor      Assets:  Manufacturing systems engineer Resilience Vocational/Educational   Cognition:  Impaired,  Moderate  ADL's:  Impaired  AIMS (if indicated):        Other History   These have been pulled in through the EMR, reviewed, and updated if appropriate.  Family History:  The patient's family history is not on file.  Medical History: Past Medical History:  Diagnosis Date   Acute cystitis without hematuria 02/28/2023   AKI (acute kidney injury) (HCC) 02/27/2023   AMS (altered mental status) 01/06/2023   Anemia 08/31/2012   Benign hypertrophy of prostate    Dysphagia 03/01/2023   Hepatitis C    Hypokalemia 02/06/2013   Schizophrenia (HCC)    Swelling of lower extremity 01/13/2023   Urinary tract infection    Urinary tract infection 06/13/2011   IMO SNOMED Dx Update Oct 2024     UTI (urinary tract infection) 06/13/2011   IMO SNOMED Dx Update Oct 2024      Surgical History: Past Surgical History:  Procedure Laterality Date   KNEE SURGERY     right    Medications:   Current Facility-Administered Medications:    acetaminophen (TYLENOL) tablet 650 mg, 650 mg, Oral, Q6H PRN **OR** acetaminophen (TYLENOL) suppository 650 mg, 650 mg, Rectal, Q6H PRN, Lincoln Brigham, MD   enoxaparin (LOVENOX) injection 40 mg, 40 mg, Subcutaneous, Daily, Lincoln Brigham, MD, 40 mg at 03/08/23 2117   feeding supplement (ENSURE ENLIVE / ENSURE PLUS) liquid 237 mL, 237 mL, Oral, BID BM, Westley Chandler, MD, 237 mL at 03/09/23 0919   haloperidol (HALDOL) tablet 10 mg, 10 mg, Oral, BID, Akintayo, Musa A, MD, 10 mg at 03/09/23 2585   haloperidol (HALDOL) tablet 5 mg, 5 mg, Oral, Q8H PRN **OR** haloperidol lactate (HALDOL) injection 5 mg, 5 mg, Intramuscular, Q8H PRN, Fatima Blank, Adriana, DO   multivitamin with minerals tablet 1 tablet, 1 tablet, Oral, Daily, Westley Chandler, MD, 1 tablet at 03/09/23 0919   Oral care mouth rinse, 15 mL, Mouth Rinse, PRN, Nestor Ramp, MD   tamsulosin [facility]) capsule 0.4 mg, 0.4 mg, Oral, Daily, Lincoln Brigham, MD, 0.4 mg at 03/09/23 2778   thiamine (VITAMIN  B1) tablet 100 mg, 100 mg, Oral, Daily, Peterson Ao, MD, 100 mg at 03/09/23 2423  Allergies: No Known Allergies  Margaretmary Dys, MD

## 2023-03-09 NOTE — Progress Notes (Signed)
Mobility Specialist: Progress Note   03/09/23 1112  Mobility  Activity Ambulated with assistance in hallway  Level of Assistance Standby assist, set-up cues, supervision of patient - no hands on  Assistive Device Front wheel walker  Distance Ambulated (ft) 250 ft  Activity Response Tolerated well  Mobility Referral Yes  Mobility visit 1 Mobility  Mobility Specialist Start Time (ACUTE ONLY) 0859  Mobility Specialist Stop Time (ACUTE ONLY) 0908  Mobility Specialist Time Calculation (min) (ACUTE ONLY) 9 min    Pt was agreeable to mobility session - received in chair. Close SV throughout with RW. C/o BLE pain and weakness. Slight unsteadiness but no overt LOB. Returned to room without fault. Left in chair with all needs met, call bell in reach. Sitter in room.   Maurene Capes Mobility Specialist Please contact via SecureChat or Rehab office at [phone]

## 2023-03-09 NOTE — TOC Progression Note (Addendum)
Transition of Care [facility]) - Progression Note    Patient Details  Name: [name] MRN: [id number] Date of Birth: [birth year]  Transition of [facility]) CM/SW Contact  Erin Sons, [place] Phone Number: 03/09/2023, 10:16 AM  Clinical Narrative:     No [facility] beds at [facility] today. CSW re-sent referrals to outside facilities.   [facility] [address] [place] [place] [address] [phone]  [facility] [address], [place] [address] [phone]  [facility] [address] [place] [address] [phone]  [facility] Patient Placement [facility], [place] [phone]  [facility] [address] [place] [address] [phone]  [facility] Regino Bellow [place] [address] [phone]  [facility] [address], [place] [address] [phone]  [facility] [address] [place] [address] [phone]  [facility] [address] [place] [address] [phone]  [facility] [address], [place] [address] [phone]  [facility] [address] [place], [place] [address] [phone]  [facility] [address], [place] [address] [phone]  [facility] [address] [place], [place] [address] [phone]  [facility] [address] [place] [address] [phone]  [facility] (0) [address], [place] [address] [phone]  [facility] [address] [place] [address] [phone]  [facility] [address] [place], [place] [address] [phone]  [facility] [address] [place] [address] [phone]  [facility] [name] [phone]  [facility] [address], [place] [address] [phone]  [facility] [name] [phone]  [facility] [address] [place] [address] [phone]  [facility] [address] [place], [place] [address] [phone]  [facility] [address] [place] [address] [phone]  [facility] [address] [place] [address] [phone]  [facility] [address], [place] [address] [phone]  [facility] [address] [place] [address] [phone]  [facility] [address], [place] [address] [phone]  [facility] [address], [place] [address] [phone]  [facility] [address] [place] [address] [phone]  [facility] [place] [phone]  [facility] [address], [place] [address] [phone]  [facility] [address] [place] [address] [phone]  [facility] [address] [place] [address] [phone]  [facility] [address] [place] [phone]  [facility] [address] [place] [address] [phone]  [facility] [address], [place] [address] [phone]  [facility] [address] [place] [address] [phone]  [facility] [address], [place] [address] [phone]  [facility] [address] [place] [address] [phone]  [facility] [address] [place], [place] [address] [phone]  [facility] [address], [place] [address] [phone]  [facility] (after hours) [address] [place]  [address] [phone]  [facility] [address] [place] [address] [phone]  [facility] [address], [place] [address] [phone]  [facility] [address] [place] [address] [phone]  [facility] [place] [phone]  [facility] [address], [place] [address] [phone]  [facility] Health 1 medical [place] [address] [phone]  [facility] [address], [place] [address] [phone]    Expected Discharge Plan: Psychiatric Hospital Barriers to Discharge: Psych Bed not available  Expected Discharge Plan and Services In-house Referral: Clinical Social Work     Living arrangements for the past 2 months: Single Family Home, Homeless Shelter                                       Social Determinants of Health (SDOH) Interventions SDOH Screenings   Food Insecurity: Food Insecurity Present (02/28/2023)  Housing: High Risk (02/28/2023)  Transportation Needs: Patient Unable To Answer (02/28/2023)  Utilities: Not At Risk (02/28/2023)  Social Connections: Patient Declined  (02/28/2023)  Tobacco Use: Medium Risk (02/27/2023)    Readmission Risk Interventions     No data to display

## 2023-03-10 ENCOUNTER — Inpatient Hospital Stay: Admit: 2023-03-10 | Payer: Medicare Other | Admitting: Psychiatry

## 2023-03-10 ENCOUNTER — Other Ambulatory Visit: Payer: Self-pay

## 2023-03-10 ENCOUNTER — Inpatient Hospital Stay
Admission: RE | Admit: 2023-03-10 | Discharge: 2023-03-21 | DRG: 885 | Disposition: A | Discharge status: Home / Self Care | Payer: Medicare Other | Source: Intra-hospital | Attending: Psychiatry | Admitting: Psychiatry

## 2023-03-10 ENCOUNTER — Encounter: Payer: Self-pay | Admitting: Psychiatry

## 2023-03-10 DIAGNOSIS — H9190 Unspecified hearing loss, unspecified ear: Secondary | ICD-10-CM | POA: Diagnosis present

## 2023-03-10 DIAGNOSIS — Z87891 Personal history of nicotine dependence: Secondary | ICD-10-CM | POA: Diagnosis not present

## 2023-03-10 DIAGNOSIS — Z79899 Other long term (current) drug therapy: Secondary | ICD-10-CM | POA: Diagnosis not present

## 2023-03-10 DIAGNOSIS — Z91148 Patient's other noncompliance with medication regimen for other reason: Secondary | ICD-10-CM | POA: Diagnosis not present

## 2023-03-10 DIAGNOSIS — F259 Schizoaffective disorder, unspecified: Principal | ICD-10-CM | POA: Diagnosis present

## 2023-03-10 DIAGNOSIS — Z5900 Homelessness unspecified: Secondary | ICD-10-CM

## 2023-03-10 DIAGNOSIS — F209 Schizophrenia, unspecified: Secondary | ICD-10-CM | POA: Diagnosis not present

## 2023-03-10 DIAGNOSIS — N400 Enlarged prostate without lower urinary tract symptoms: Secondary | ICD-10-CM | POA: Diagnosis present

## 2023-03-10 DIAGNOSIS — K5900 Constipation, unspecified: Secondary | ICD-10-CM | POA: Diagnosis present

## 2023-03-10 DIAGNOSIS — R8290 Unspecified abnormal findings in urine: Secondary | ICD-10-CM | POA: Diagnosis present

## 2023-03-10 DIAGNOSIS — N179 Acute kidney failure, unspecified: Secondary | ICD-10-CM | POA: Diagnosis not present

## 2023-03-10 DIAGNOSIS — F200 Paranoid schizophrenia: Secondary | ICD-10-CM | POA: Diagnosis not present

## 2023-03-10 LAB — SARS CORONAVIRUS 2 BY RT PCR: SARS Coronavirus 2 by RT PCR: NEGATIVE

## 2023-03-10 MED ORDER — HALOPERIDOL DECANOATE 100 MG/ML IM SOLN
100.0000 mg | Freq: Once | INTRAMUSCULAR | Status: AC
Start: 2023-03-10 — End: 2023-03-10
  Administered 2023-03-10: 100 mg via INTRAMUSCULAR
  Filled 2023-03-10: qty 1

## 2023-03-10 MED ORDER — HALOPERIDOL 5 MG PO TABS
10.0000 mg | ORAL_TABLET | Freq: Two times a day (BID) | ORAL | Status: DC
  Administered 2023-03-10 – 2023-03-21 (×22): 10 mg via ORAL
  Filled 2023-03-10 (×6): qty 2
  Filled 2023-03-10: qty 1
  Filled 2023-03-10 (×16): qty 2

## 2023-03-10 MED ORDER — HALOPERIDOL 5 MG PO TABS
5.0000 mg | ORAL_TABLET | Freq: Three times a day (TID) | ORAL | Status: DC | PRN
  Filled 2023-03-10: qty 1

## 2023-03-10 MED ORDER — HALOPERIDOL LACTATE 5 MG/ML IJ SOLN: 5.0000 mg | Freq: Three times a day (TID) | INTRAMUSCULAR | Status: DC | PRN

## 2023-03-10 MED ORDER — ORAL CARE MOUTH RINSE
15.0000 mL | OROMUCOSAL | Status: DC | PRN
Start: 2023-03-10 — End: 2023-03-21

## 2023-03-10 MED ORDER — HALOPERIDOL DECANOATE 100 MG/ML IM SOLN: 100.0000 mg | Freq: Once | INTRAMUSCULAR | Status: DC

## 2023-03-10 MED ORDER — ENSURE ENLIVE PO LIQD
237.0000 mL | Freq: Two times a day (BID) | ORAL | Status: DC
  Administered 2023-03-11 – 2023-03-21 (×18): 237 mL via ORAL

## 2023-03-10 MED ORDER — TAMSULOSIN HCL 0.4 MG PO CAPS
0.4000 mg | ORAL_CAPSULE | Freq: Every day | ORAL | Status: DC
  Administered 2023-03-11 – 2023-03-21 (×11): 0.4 mg via ORAL
  Filled 2023-03-10 (×11): qty 1

## 2023-03-10 MED ORDER — HALOPERIDOL DECANOATE 100 MG/ML IM SOLN
100.0000 mg | Freq: Once | INTRAMUSCULAR | Status: DC
  Filled 2023-03-10: qty 1

## 2023-03-10 MED ORDER — ADULT MULTIVITAMIN W/MINERALS CH
1.0000 | ORAL_TABLET | Freq: Every day | ORAL | Status: DC
  Administered 2023-03-11 – 2023-03-21 (×11): 1 via ORAL
  Filled 2023-03-10 (×11): qty 1

## 2023-03-10 MED ORDER — HALOPERIDOL DECANOATE 100 MG/ML IM SOLN: 100.0000 mg | INTRAMUSCULAR | Status: DC

## 2023-03-10 MED ORDER — DILTIAZEM HCL ER COATED BEADS 180 MG PO CP24
180.0000 mg | ORAL_CAPSULE | Freq: Every day | ORAL | Status: DC
  Administered 2023-03-11 – 2023-03-21 (×11): 180 mg via ORAL
  Filled 2023-03-10 (×11): qty 1

## 2023-03-10 MED ORDER — THIAMINE MONONITRATE 100 MG PO TABS
100.0000 mg | ORAL_TABLET | Freq: Every day | ORAL | Status: DC
  Administered 2023-03-11 – 2023-03-21 (×11): 100 mg via ORAL
  Filled 2023-03-10 (×11): qty 1

## 2023-03-10 NOTE — Plan of Care (Signed)
D: Pt alert and oriented x 2. Pt report experiencing anxiety/depression at this time. Pt denies experiencing any pain at this time. Pt denies experiencing any SI/HI, or AVH at this time.   A: Support and encouragement provided. Frequent verbal contact made. Routine safety checks conducted q15 minutes. Pt placed on continuous 1:1 for fall safety.   R: Pt verbally contracts for safety at this time. Plan of care ongoing.  Skin assessment performed upon admission. Pt has a lump on Right shoulder, healed scar on Right leg, Left foot callused, bilat non pitting leg edema, and swollen scrotum.  When asked why he was brought to the hospital the pt became restless and stated he was arrested 2 times for robbery. Later pt stated he didn't know why he was brought here and wanted to leave. Pt advised he could speak to the MD tomorrow in the morning. Pt then stated he was arrested for putting litter in a women's yard and went through 5 court rooms.   Problem: Self-Concept: Goal: Level of anxiety will decrease Outcome: Not Progressing   Problem: Education: Goal: Utilization of techniques to improve thought processes will improve Outcome: Not Progressing

## 2023-03-10 NOTE — Assessment & Plan Note (Deleted)
RD following. - Continue Ensure TID.  - Continue multivitamin

## 2023-03-10 NOTE — Tx Team (Signed)
Initial Treatment Plan 03/10/2023 6:34 PM [name] ZOX:[id number]    PATIENT STRESSORS: Financial difficulties   Health problems     PATIENT STRENGTHS: Motivation for treatment/growth  Religious Affiliation    PATIENT IDENTIFIED PROBLEMS: Ineffective coping skills  Lack of support system  Poor living arrangements                 DISCHARGE CRITERIA:  Ability to meet basic life and health needs Improved stabilization in mood, thinking, and/or behavior Motivation to continue treatment in a less acute level of care  PRELIMINARY DISCHARGE PLAN: Outpatient therapy Placement in alternative living arrangements  PATIENT/FAMILY INVOLVEMENT: This treatment plan has been presented to and reviewed with the patient, [name], and/or family member.  The patient and family have been given the opportunity to ask questions and make suggestions.  Sharin Mons, RN 03/10/2023, 6:34 PM

## 2023-03-10 NOTE — Plan of Care (Signed)
  Problem: Education: Goal: Ability to state activities that reduce stress will improve Outcome: Not Met (add Reason)   Problem: Coping: Goal: Ability to identify and develop effective coping behavior will improve Outcome: Not Met (add Reason)   Problem: Self-Concept: Goal: Ability to identify factors that promote anxiety will improve Outcome: Not Met (add Reason) Goal: Level of anxiety will decrease Outcome: Not Met (add Reason)

## 2023-03-10 NOTE — Progress Notes (Signed)
Physical Therapy Treatment Patient Details Name: [name] MRN: [id number] DOB: [birth year] Today's Date: 03/10/2023   History of Present Illness Pt is an 83 y/o M presenting to ED on 2/2 with "blackout spells" and urinary urgency, admitted for AKI. PMH includes schizoaffective disorder, homelessness, AMS, bipolar, HTN, hep C, BPH    PT Comments  Pt up in chair on arrival, pleasant and agreeable to session with continued progress towards acute goals. Pt demonstrating transfers and gait with grossly CGA progressing to supervision with increased distance and RW for support. Pt demonstrating improved posture, RW proximity and safety throughout session. Pt continues to be limited by LE weakness, decreased activity tolerance, impaired balance/postural reactions and impaired cognition. Pt continues to benefit from skilled PT services to progress toward functional mobility goals.     If plan is discharge home, recommend the following: Assistance with cooking/housework;Supervision due to cognitive status   Can travel by private vehicle        Nurse, learning disability    Recommendations for Other Services       Precautions / Restrictions Precautions Precautions: Fall Recall of Precautions/Restrictions: Impaired Restrictions Other Position/Activity Restrictions: HOH     Mobility  Bed Mobility Overal bed mobility: Independent             General bed mobility comments: seated in chair beg./end of session    Transfers Overall transfer level: Needs assistance Equipment used: None, Rolling walker (2 wheels) Transfers: Sit to/from Stand Sit to Stand: Contact guard assist           General transfer comment: CGA for safety, with and without RW, RW provides more support. Cues for awareness with alignment of chair prior to sitting.    Ambulation/Gait Ambulation/Gait assistance: Contact guard assist, Supervision Gait Distance (Feet): 300 Feet Assistive device: Rolling  walker (2 wheels) Gait Pattern/deviations: Step-through pattern, Decreased step length - right, Decreased step length - left, Drifts right/left, Shuffle Gait velocity: decreased     General Gait Details: low shuffling steps throughout, improved RW proximity and no LOB noted, pt with improved safety awareness with no instances of lifting RW from floor this session   Stairs             Wheelchair Mobility     Tilt Bed    Modified Rankin (Stroke Patients Only)       Balance Overall balance assessment: Needs assistance Sitting-balance support: Feet supported Sitting balance-Leahy Scale: Good     Standing balance support: During functional activity, No upper extremity supported Standing balance-Leahy Scale: Fair Standing balance comment: more stable with RW.                            Communication Communication Communication: Impaired Factors Affecting Communication: Hearing impaired  Cognition Arousal: Alert Behavior During Therapy: Flat affect   PT - Cognitive impairments: No family/caregiver present to determine baseline, Problem solving, Memory, Sequencing, Safety/Judgement                         Following commands: Impaired Following commands impaired: Follows one step commands inconsistently    Cueing Cueing Techniques: Verbal cues, Gestural cues, Visual cues  Exercises      General Comments General comments (skin integrity, edema, etc.): HR up tp 113bpm with activity      Pertinent Vitals/Pain Pain Assessment Pain Assessment: No/denies pain Pain Intervention(s): Monitored during session    Home Living  Prior Function            PT Goals (current goals can now be found in the care plan section) Acute Rehab PT Goals Patient Stated Goal: to transfer today PT Goal Formulation: Patient unable to participate in goal setting Time For Goal Achievement: 03/14/23 Progress towards PT goals:  Progressing toward goals    Frequency    Min 1X/week      PT Plan      Co-evaluation              AM-PAC PT "6 Clicks" Mobility   Outcome Measure  Help needed turning from your back to your side while in a flat bed without using bedrails?: None Help needed moving from lying on your back to sitting on the side of a flat bed without using bedrails?: None Help needed moving to and from a bed to a chair (including a wheelchair)?: None Help needed standing up from a chair using your arms (e.g., wheelchair or bedside chair)?: A Little Help needed to walk in hospital room?: A Little Help needed climbing 3-5 steps with a railing? : A Little 6 Click Score: 21    End of Session Equipment Utilized During Treatment: Gait belt Activity Tolerance: Patient tolerated treatment well Patient left: with call bell/phone within reach;in chair;with nursing/sitter in room Psychiatrist) Nurse Communication: Mobility status PT Visit Diagnosis: Unsteadiness on feet (R26.81);Other abnormalities of gait and mobility (R26.89);Other symptoms and signs involving the nervous system (R29.898)     Time: 1610-9604 PT Time Calculation (min) (ACUTE ONLY): 16 min  Charges:    $Gait Training: 8-22 mins PT General Charges $$ ACUTE PT VISIT: 1 Visit                     Tyaire Odem R. PTA Acute Rehabilitation Services Office: [phone]   Catalina Antigua 03/10/2023, 12:28 PM

## 2023-03-10 NOTE — Assessment & Plan Note (Deleted)
Patient has a chronic history of Hydrocele, Per EMR patient been complaining about scrotal edema for several years. US Scrotum for 07/15/2014 noted bilateral hydrocele L>R. Denies pain.  - 2/5 US Scrotum consistent w/ complex L hydrocele  - CTM   Chronic and Stable Problems:  BPH: Continue home Flomax 0.4 mg nightly Hyperkalemia: (resolved on 2/3). BPM unremarkable, K 4.3  FEN/GI: Regular diet PPx: Lovenox Dispo: Awaiting inpatient psych bed availability

## 2023-03-10 NOTE — Group Note (Signed)
Date:  03/10/2023 Time:  9:58 PM  Group Topic/Focus:  Self Care:   The focus of this group is to help patients understand the importance of self-care in order to improve or restore emotional, physical, spiritual, interpersonal, and financial health.    Participation Level:  Active  Participation Quality:  Appropriate  Affect:  Appropriate  Cognitive:  Appropriate  Insight: Good  Engagement in Group:  Engaged  Modes of Intervention:  Discussion  Additional Comments:    [name] 03/10/2023, 9:58 PM

## 2023-03-10 NOTE — Assessment & Plan Note (Deleted)
Stable. Labs WNL. Consistent with known aortic stenosis seen on ECHO - Replace lytes as needed

## 2023-03-10 NOTE — Consult Note (Signed)
[facility] Consult Follow-up  Patient Name: .[name]  MRN: [id number]  DOB: 1940/09/14  Consult Order details:  Orders (From admission, onward)     Start     Ordered   02/28/23 1139  IP CONSULT TO PSYCHIATRY       Comments: He can be seen tomorrow  Ordering Provider: Peterson Ao, MD  Provider:  (Not yet assigned)  Question Answer  Location [facility]  Reason for Consult? 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 male admitted: Medically for 02/27/2023  9:59 AM for acute kidney injury, altered mental status. He carries the psychiatric diagnoses of schizophrenia and has a past medical history of protein-calorie malnutrition, neurosyphilis, hearing loss, hepatitis C, anemia, BPH, and repeated urinary tract infections in a male.   His current presentation of altered mental status is most consistent with schizoaffective disorder with medication non-adherence. He meets criteria for schizophrenia based on historical chart data, current disorganization.  Current outpatient psychotropic medications include haldol 10 mg QHS and historically he has had a fair response to these medications. He was not compliant with medications prior to admission as evidenced by current disorganization and patient report. We have increased his dosage while inpatient to haldol 10 mg BID. We will dose him with long-acting haloperidol and bridge him with oral pills until his second dose on 03/17/2023. Despite the patient's continuing disorganization, we have been somewhat reluctant to increase the haloperidol in the presence of his other medical conditions.  Please  see plan below for detailed recommendations.   Diagnoses:  Active Hospital problems: Active Problems:   Schizoaffective disorder, bipolar type (HCC)   Schizophrenia, chronic condition with acute exacerbation (HCC)   Murmur   Protein-calorie malnutrition, severe   Hydrocele   Agitation   Tachycardia   Abnormal urinalysis    Plan   ## Psychiatric Medication Recommendations:  -- Continue haloperidol 10 mg BID for schizophrenia -- Start one time dose of haloperidol decanoate IM 100 mg on 2/13  - Followed by 100 mg haloperidol decanoate IM 100 mg on 03/17/2023  ## Medical Decision Making Capacity:  Patient currently lacks capacity due to altered mental status  ## Further Work-up:  While pt on Qtc prolonging medications, please monitor & replete K+ to 4 and Mg2+ to 2 -- most recent EKG on 2/11 had QtC of 441 (and SVT) -- Pertinent labwork reviewed earlier this admission includes: TSH (WNL), B12 (1942-H), UA (consistent with UTI), improving GFR   ## Disposition:-- We recommend inpatient psychiatric hospitalization after medical hospitalization. Patient has been involuntarily committed on 03/07/2023.  Patient is discharging to [facility]. He said that he would prefer the [facility] hospital in [place] or [place], but they have no beds.   ## Behavioral / Environmental: -Patient would benefit from more frequent contact with medical team to delineate plan of care and allow for clarification questions, which will help alleviate anxiety regarding treatment. If possible, try to check back in with the pt in the afternoon.    ## Safety and Observation Level:  - Based on my clinical evaluation, I estimate the patient to  be at moderate risk of self harm and harm to others in the current setting due to ongoing psychotic behavior - At this time, we recommend  routine. This decision is based on my review of the chart including patient's history and current presentation, interview of the patient, mental status  examination, and consideration of suicide risk including evaluating suicidal ideation, plan, intent, suicidal or self-harm behaviors, risk factors, and protective factors. This judgment is based on our ability to directly address suicide risk, implement suicide prevention strategies, and develop a safety plan while the patient is in the clinical setting. Please contact our team if there is a concern that risk level has changed.  CSSR Risk Category:C-SSRS RISK CATEGORY: No Risk  Suicide Risk Assessment: Patient has following modifiable risk factors for suicide: under treated depression , recklessness, and medication noncompliance, which we are addressing by restarting medication, recommending inpatient psychiatric stay once medically stabilized. Patient has following non-modifiable or demographic risk factors for suicide: male gender, history of self harm behavior, and psychiatric hospitalization Patient has the following protective factors against suicide: Supportive friends, Cultural, spiritual, or religious beliefs that discourage suicide, and Frustration tolerance  Thank you for this consult request. Recommendations have been communicated to the primary team.  We will follow at this time.   Margaretmary Dys, MD       History of Present Illness  Relevant Aspects of Hospital Course:  Admitted on 02/27/2023 for feeling faint, AMS; found to have AKI and acute cystitis without hematuria. On 2/11, pt found to have run of SVT with ST depressions on EKG. Serial troponin found to be negative.  Patient Report:  Reviewed medical administration, patient has been taking his haloperidol orally, but has had a history of noncompliance with oral medication including during this hospitalization. Patient was agreeable to the haloperidol decanoate. Patient was oriented to the name of the notewriter, the location, and the situation, though he did get confused and insist that he was being kept here  because of "false charges". Still not oriented to date. Patient was amenable to going to [facility], and was able to communicate (correctly) that [facility] is in [place].   Psych ROS:  Depression: Patient unable to focus long enough to answer this question Anxiety: Denies significant symptoms of worry, but patient affect was anxious. Mania (lifetime and current): Patient denies a history of mania. Unable to remember how much he's been sleeping lately.  Psychosis: (lifetime and current): Patient is not actively responding to internal stimuli but grew fairly agitated during the second interview and brought up his recurring delusions as above.  He denied the auditory and visual hallucinations he had reported on previous days.  Collateral information:  Alona Bene (Sister) [phone] ([place]) on 03/01/2023 at 12:10 pm. No answer. Second attempt to obtain collateral information from patient's sister [name] (Sister) [phone] [facility]) on 03/06/2023 at 2:35 pm resulted to no answer.  Review of Systems  Constitutional:  Negative for chills, fever, malaise/fatigue and weight loss.  HENT:  Positive for hearing loss.   Eyes:  Positive for blurred vision.  Respiratory:  Negative for cough.   Cardiovascular:  Positive for chest pain.  Genitourinary:  Positive for dysuria.  Neurological:  Negative for headaches.  Psychiatric/Behavioral:  Positive for depression, hallucinations and memory loss. Negative for substance abuse and suicidal ideas. The patient is nervous/anxious. The patient does not have insomnia.      Psychiatric and Social History  Psychiatric History:  Information collected from chart  Prev Dx/Sx:  schizophrenia vs schizoaffective disorder, bipolar type Current Psych Provider: [facility] Home Meds (current): haloperidol 10 mg at bedtime (non compliant) and haloperidol decanoate, most recent injection 01/18/23 Previous Med Trials: ziprasidone, risperidone, ativan,  zolplidem Therapy: not currently  Prior Psych Hospitalization: last 12/2022, possible others at [facility]. Prior Self Harm:  Prior Violence: reports false imprisonment for armed robbery in past  Family Psych History: Patient not able to confirm Family Hx suicide: Patient not able to confirm  Social History:  Developmental Hx: Patient not able to confirm Educational Hx: Patient not able to confirm Occupational Hx: Patient not able to confirm Legal Hx: "wrongful arrest for armed robbery" Not recent. Not pending. Living Situation: homeless, but leaves thing at his sister's house Spiritual Hx: Emergency planning/management officer Hx: [employer] during [place] war Access to weapons/lethal means: denies   Substance History Alcohol: does not drink, pt too disorganized to complete review of substances. Tobacco: 06/11/1955 - 06/11/1975; Smoked an average of 1 pack/day for 20.0 years  Illicit drugs: per chart, this does not appear to be a significant problem for him. Prescription drug abuse: denies Rehab hx: denies  Exam Findings  Physical Exam: Pt is disheveled, and still malodorous. He is Transport planner.  He was sitting upright in a recliner and at several points nearly stood up when he grew agitated.  He seemed to think better of it and would sit back down.  Vital Signs:  Temp:  [97.8 F (36.6 C)-98 F (36.7 C)] 97.8 F (36.6 C) (02/12 2157) Pulse Rate:  [52-95] 74 (02/12 2157) Resp:  [18-19] 18 (02/12 2157) BP: (116-129)/(60-67) 117/62 (02/12 2157) SpO2:  [98 %-100 %] 99 % (02/12 2157) Blood pressure 117/62, pulse 74, temperature 97.8 F (36.6 C), temperature source Oral, resp. rate 18, height 5\' 8"  (1.727 m), weight 80 kg, SpO2 99%. Body mass index is 26.82 kg/m.  Physical Exam Vitals and nursing note reviewed.  Constitutional:      General: He is not in acute distress.    Appearance: He is ill-appearing.  HENT:     Head: Normocephalic and atraumatic.  Skin:    General: Skin is warm and dry.   Neurological:     Mental Status: He is disoriented.  Psychiatric:        Attention and Perception: Perception normal. He is inattentive.        Mood and Affect: Mood is depressed. Affect is inappropriate.        Speech: Speech is rapid and pressured and tangential.        Behavior: Behavior is uncooperative, agitated and hyperactive.        Thought Content: Thought content is paranoid and delusional. Thought content does not include homicidal or suicidal ideation.        Cognition and Memory: Cognition is impaired. Memory is impaired. He exhibits impaired recent memory.        Judgment: Judgment is impulsive.     Mental Status Exam: General Appearance: Disheveled, poorly groomed, dressed in layers of clothes.   Orientation:  Other:  Physician and person, place, occasionally situation. Waxing and waning.  Memory:  Immediate;   Poor Recent;   Poor Remote;   Poor  Concentration:  Concentration: Poor and Attention Span: Poor  Recall:  Poor  Attention  Fair  Eye Contact:  Good (staring)  Speech:  Normal Rate  Language:  Fair  Volume:  Increased (hard of hearing)  Mood: "I am frustrated that y'all have me in here for a robbery I  didn't commit"  Affect:  Congruent and irritable  Thought Process:  Disorganized   Thought Content:  Illogical and Tangential  Suicidal Thoughts:   unable to obtain   Homicidal Thoughts:   unable to obtain   Judgment:  Poor  Insight:  Lacking  Psychomotor Activity:  Normal  Akathisia:  No  Fund of Knowledge:  Poor      Assets:  Manufacturing systems engineer Resilience Vocational/Educational  Cognition:  Impaired,  Moderate  ADL's:  Impaired  AIMS (if indicated):        Other History   These have been pulled in through the EMR, reviewed, and updated if appropriate.  Family History:  The patient's family history is not on file.  Medical History: Past Medical History:  Diagnosis Date   Acute cystitis without hematuria 02/28/2023   AKI (acute kidney  injury) (HCC) 02/27/2023   AMS (altered mental status) 01/06/2023   Anemia 08/31/2012   Benign hypertrophy of prostate    Dysphagia 03/01/2023   Hepatitis C    Hypokalemia 02/06/2013   Schizophrenia (HCC)    Swelling of lower extremity 01/13/2023   Urinary tract infection    Urinary tract infection 06/13/2011   IMO SNOMED Dx Update Oct 2024     UTI (urinary tract infection) 06/13/2011   IMO SNOMED Dx Update Oct 2024      Surgical History: Past Surgical History:  Procedure Laterality Date   KNEE SURGERY     right    Medications:   Current Facility-Administered Medications:    acetaminophen (TYLENOL) tablet 650 mg, 650 mg, Oral, Q6H PRN **OR** acetaminophen (TYLENOL) suppository 650 mg, 650 mg, Rectal, Q6H PRN, Lincoln Brigham, MD   diltiazem (CARDIZEM CD) 24 hr capsule 180 mg, 180 mg, Oral, Daily, Elberta Fortis, MD, 180 mg at 03/09/23 1114   enoxaparin (LOVENOX) injection 40 mg, 40 mg, Subcutaneous, Daily, Lincoln Brigham, MD, 40 mg at 03/09/23 2127   feeding supplement (ENSURE ENLIVE / ENSURE PLUS) liquid 237 mL, 237 mL, Oral, BID BM, Westley Chandler, MD, 237 mL at 03/09/23 1502   haloperidol (HALDOL) tablet 10 mg, 10 mg, Oral, BID, Akintayo, Musa A, MD, 10 mg at 03/09/23 2126   haloperidol (HALDOL) tablet 5 mg, 5 mg, Oral, Q8H PRN, 5 mg at 03/09/23 1158 **OR** haloperidol lactate (HALDOL) injection 5 mg, 5 mg, Intramuscular, Q8H PRN, Fatima Blank, Adriana, DO   multivitamin with minerals tablet 1 tablet, 1 tablet, Oral, Daily, Westley Chandler, MD, 1 tablet at 03/09/23 0919   Oral care mouth rinse, 15 mL, Mouth Rinse, PRN, Nestor Ramp, MD   tamsulosin [facility]) capsule 0.4 mg, 0.4 mg, Oral, Daily, Lincoln Brigham, MD, 0.4 mg at 03/09/23 0981   thiamine (VITAMIN B1) tablet 100 mg, 100 mg, Oral, Daily, Peterson Ao, MD, 100 mg at 03/09/23 1914  Allergies: No Known Allergies  Margaretmary Dys, MD, PGY1

## 2023-03-10 NOTE — Assessment & Plan Note (Deleted)
Patient linear and organized with questioning this morning. Patient accepted at [facility] for inpatient psychiatric treatment. Unable to to be transferred due to safe transport unavailability and required pre-admit labs.  - Psych following, appreciate recs  - Haldol 10 mg qAM and 10 mg at bedtime, Haldol 5 mg PO or IM for agitation q8h as needed  - IVC 2/10 needs renewal on 2/17 - Transfer to [facility], COVID negative  - 1:1 sitter in place

## 2023-03-10 NOTE — Group Note (Signed)
Recreation Therapy Group Note   Group Topic:Problem Solving  Group Date: 03/10/2023 Start Time: 1400 End Time: 1450 Facilitators: Rosina Lowenstein, LRT, CTRS Location:  Dayroom  Group Description: Life Boat. Patients were given the scenario that they are on a boat that is about to become shipwrecked, leaving them stranded on an [place]. They are asked to make a list of 15 different items that they want to take with them when they are stranded on the [place]. Patients are asked to rank their items from most important to least important, #1 being the most important and #15 being the least. Patients will work individually for the first round to come up with 15 items and then pair up with a peer(s) to condense their list and come up with one list of 15 items between the two of them. Patients or LRT will read aloud the 15 different items to the group after each round. LRT facilitated post-activity processing to discuss how this activity can be used in daily life post discharge.   Goal Area(s) Addressed:  Patient will identify priorities, wants and needs. Patient will communicate with LRT and peers. Patient will work collectively as a Administrator, Civil Service. Patient will work on Product manager.    Affect/Mood: N/A   Participation Level: Did not attend    Clinical Observations/Individualized Feedback: Patient did not attend group due to not being on the unit at the time.   Plan: Continue to engage patient in RT group sessions 2-3x/week.   Rosina Lowenstein, LRT, CTRS 03/10/2023 4:59 PM

## 2023-03-10 NOTE — Assessment & Plan Note (Deleted)
During admission, patient had intermittent runs of tachycardia, suspicious for SVT. He denied shortness of breath, diaphoresis, dyspnea or radiating pain. HR 52-109 last 24 hours. Blood pressure 110s-120s past 24 hours. He was started on PO diltiazem for rate control.  - Continue Diltiazam 180 mg daily

## 2023-03-10 NOTE — Assessment & Plan Note (Deleted)
Pt previously treated with for E. Coli urinary tract infection during this admission and completed a 5 day course of cefadroxil (2/2-2/7). Repeat UA dirty with squamous cells, leukocytes, bacteria. Negative for nitrites.  - Defer starting antibiotics, given completed course during admission

## 2023-03-10 NOTE — Plan of Care (Addendum)
Patient refused COVID swab despite multiple attempts. Lincoln Brigham, Md notified. No further orders. Pt continues with 1:1 sitter.  Safety maintained. Will continue to monitor.   Problem: Education: Goal: Knowledge of General Education information will improve Description: Including pain rating scale, medication(s)/side effects and non-pharmacologic comfort measures Outcome: Progressing   Problem: Health Behavior/Discharge Planning: Goal: Ability to manage health-related needs will improve Outcome: Progressing   Problem: Clinical Measurements: Goal: Ability to maintain clinical measurements within normal limits will improve Outcome: Progressing Goal: Will remain free from infection Outcome: Progressing Goal: Diagnostic test results will improve Outcome: Progressing Goal: Respiratory complications will improve Outcome: Progressing Goal: Cardiovascular complication will be avoided Outcome: Progressing   Problem: Activity: Goal: Risk for activity intolerance will decrease Outcome: Progressing   Problem: Nutrition: Goal: Adequate nutrition will be maintained Outcome: Progressing   Problem: Coping: Goal: Level of anxiety will decrease Outcome: Progressing   Problem: Elimination: Goal: Will not experience complications related to bowel motility Outcome: Progressing Goal: Will not experience complications related to urinary retention Outcome: Progressing   Problem: Pain Managment: Goal: General experience of comfort will improve and/or be controlled Outcome: Progressing   Problem: Safety: Goal: Ability to remain free from injury will improve Outcome: Progressing   Problem: Skin Integrity: Goal: Risk for impaired skin integrity will decrease Outcome: Progressing

## 2023-03-10 NOTE — TOC Transition Note (Signed)
Transition of Care [facility]) - Discharge Note   Patient Details  Name: [name] MRN: [id number] Date of Birth: 1940-12-06  Transition of [facility]) CM/SW Contact:  Kaige Whistler A [place], LCSW Phone Number: 03/10/2023, 1:12 PM   Clinical Narrative:     Patient will DC to: [facility]  Anticipated DC date: 03/10/23  Family notified: Pt declined  Transport by: [facility]      Per MD patient ready for DC to [facility]. RN, patient, patient's family, and facility notified of DC. RN to call report prior to discharge [phone]). DC packet on chart. IVC paperwork completed and in packet. EMTALA completed for transport, Sheriff's department stated necessary for transport as new standard of practice.   CSW will sign off for now as social work intervention is no longer needed. Please consult [place] again if new needs arise.   Final next level of care: Psychiatric Hospital Barriers to Discharge: Barriers Resolved   Patient Goals and CMS Choice            Discharge Placement              Patient chooses bed at:  [facility])   Name of family member notified: Pt declined Patient and family notified of of transfer: 03/10/23  Discharge Plan and Services Additional resources added to the After Visit Summary for   In-house Referral: Clinical Social Work                                   Social Drivers of Health (SDOH) Interventions SDOH Screenings   Food Insecurity: Food Insecurity Present (02/28/2023)  Housing: High Risk (02/28/2023)  Transportation Needs: Patient Unable To Answer (02/28/2023)  Utilities: Not At Risk (02/28/2023)  Social Connections: Patient Declined (02/28/2023)  Tobacco Use: Medium Risk (02/27/2023)     Readmission Risk Interventions     No data to display

## 2023-03-11 DIAGNOSIS — F200 Paranoid schizophrenia: Secondary | ICD-10-CM | POA: Diagnosis not present
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
## 2023-03-11 NOTE — BH IP Treatment Plan (Signed)
Interdisciplinary Treatment and Diagnostic Plan Update  03/11/2023 Time of Session: 9:59 AM  [name] MRN: [id number]  Principal Diagnosis: Schizophrenia [facility])  Secondary Diagnoses: Principal Problem:   Schizophrenia (HCC)   Current Medications:  Current Facility-Administered Medications  Medication Dose Route Frequency Provider Last Rate Last Admin   diltiazem (CARDIZEM CD) 24 hr capsule 180 mg  180 mg Oral Daily Mariel Craft, MD   180 mg at 03/11/23 0834   feeding supplement (ENSURE ENLIVE / ENSURE PLUS) liquid 237 mL  237 mL Oral BID BM Mariel Craft, MD   237 mL at 03/11/23 0837   haloperidol (HALDOL) tablet 10 mg  10 mg Oral BID Mariel Craft, MD   10 mg at 03/11/23 8413   haloperidol (HALDOL) tablet 5 mg  5 mg Oral Q8H PRN Mariel Craft, MD       Or   haloperidol lactate (HALDOL) injection 5 mg  5 mg Intramuscular Q8H PRN Mariel Craft, MD       [START ON 03/17/2023] haloperidol decanoate (HALDOL DECANOATE) 100 MG/ML injection 100 mg  100 mg Intramuscular Once Mariel Craft, MD       multivitamin with minerals tablet 1 tablet  1 tablet Oral Daily Mariel Craft, MD   1 tablet at 03/11/23 2440   Oral care mouth rinse  15 mL Mouth Rinse PRN Mariel Craft, MD       tamsulosin [facility]) capsule 0.4 mg  0.4 mg Oral Daily Mariel Craft, MD   0.4 mg at 03/11/23 1027   thiamine (VITAMIN B1) tablet 100 mg  100 mg Oral Daily Mariel Craft, MD   100 mg at 03/11/23 2536   PTA Medications: Medications Prior to Admission  Medication Sig Dispense Refill Last Dose/Taking   acetaminophen (TYLENOL) 325 MG tablet Take 2 tablets (650 mg total) by mouth every 6 (six) hours as needed for mild pain (pain score 1-3) (or Fever >/= 101).      acetaminophen (TYLENOL) 650 MG suppository Place 1 suppository (650 mg total) rectally every 6 (six) hours as needed for mild pain (pain score 1-3) (or Fever >/= 101).      diltiazem (CARDIZEM CD) 180 MG 24 hr capsule Take 1 capsule  (180 mg total) by mouth daily.      feeding supplement (ENSURE ENLIVE / ENSURE PLUS) LIQD Take 237 mLs by mouth 2 (two) times daily between meals.      haloperidol (HALDOL) 10 MG tablet Take 1 tablet (10 mg total) by mouth 2 (two) times daily.      [START ON 03/17/2023] haloperidol decanoate (HALDOL DECANOATE) 100 MG/ML injection Inject 1 mL (100 mg total) into the muscle once a week for 7 days. Pt received 1st IM injection on 2/13, needs to receive 2nd IM injection      Mouthwashes (MOUTH RINSE) LIQD solution 15 mLs by Mouth Rinse route as needed (for oral care).      Multiple Vitamin (MULTIVITAMIN WITH MINERALS) TABS tablet Take 1 tablet by mouth daily.      tamsulosin (FLOMAX) 0.4 MG CAPS capsule Take 1 capsule (0.4 mg total) by mouth daily. 30 capsule 4    thiamine (VITAMIN B-1) 100 MG tablet Take 1 tablet (100 mg total) by mouth daily.       Patient Stressors: Financial difficulties   Health problems    Patient Strengths: Motivation for treatment/growth  Religious Affiliation   Treatment Modalities: Medication Management, Group therapy, Case management,  1 to 1 session with clinician, Psychoeducation, Recreational therapy.   Physician Treatment Plan for Primary Diagnosis: Schizophrenia (HCC) Long Term Goal(s):     Short Term Goals:    Medication Management: Evaluate patient's response, side effects, and tolerance of medication regimen.  Therapeutic Interventions: 1 to 1 sessions, Unit Group sessions and Medication administration.  Evaluation of Outcomes: Not Progressing  Physician Treatment Plan for Secondary Diagnosis: Principal Problem:   Schizophrenia (HCC)  Long Term Goal(s):     Short Term Goals:       Medication Management: Evaluate patient's response, side effects, and tolerance of medication regimen.  Therapeutic Interventions: 1 to 1 sessions, Unit Group sessions and Medication administration.  Evaluation of Outcomes: Not Progressing   RN Treatment Plan for  Primary Diagnosis: Schizophrenia (HCC) Long Term Goal(s): Knowledge of disease and therapeutic regimen to maintain health will improve  Short Term Goals: Ability to remain free from injury will improve, Ability to verbalize frustration and anger appropriately will improve, Ability to demonstrate self-control, Ability to participate in decision making will improve, Ability to verbalize feelings will improve, Ability to disclose and discuss suicidal ideas, Ability to identify and develop effective coping behaviors will improve, and Compliance with prescribed medications will improve  Medication Management: RN will administer medications as ordered by provider, will assess and evaluate patient's response and provide education to patient for prescribed medication. RN will report any adverse and/or side effects to prescribing provider.  Therapeutic Interventions: 1 on 1 counseling sessions, Psychoeducation, Medication administration, Evaluate responses to treatment, Monitor vital signs and CBGs as ordered, Perform/monitor CIWA, COWS, AIMS and Fall Risk screenings as ordered, Perform wound care treatments as ordered.  Evaluation of Outcomes: Not Progressing   LCSW Treatment Plan for Primary Diagnosis: Schizophrenia (HCC) Long Term Goal(s): Safe transition to appropriate next level of care at discharge, Engage patient in therapeutic group addressing interpersonal concerns.  Short Term Goals: Engage patient in aftercare planning with referrals and resources, Increase social support, Increase ability to appropriately verbalize feelings, Increase emotional regulation, Facilitate acceptance of mental health diagnosis and concerns, Facilitate patient progression through stages of change regarding substance use diagnoses and concerns, Identify triggers associated with mental health/substance abuse issues, and Increase skills for wellness and recovery  Therapeutic Interventions: Assess for all discharge needs, 1  to 1 time with Social worker, Explore available resources and support systems, Assess for adequacy in community support network, Educate family and significant other(s) on suicide prevention, Complete Psychosocial Assessment, Interpersonal group therapy.  Evaluation of Outcomes: Not Progressing   Progress in Treatment: Attending groups: Yes. and No. Participating in groups: Yes. and No. Taking medication as prescribed: Yes. Toleration medication: Yes. Family/Significant other contact made: No, will contact:  CSW will contact if given permission  Patient understands diagnosis: Yes. Discussing patient identified problems/goals with staff: Yes. Medical problems stabilized or resolved: Yes. Denies suicidal/homicidal ideation: Yes. Issues/concerns per patient self-inventory: No. Other: None   New problem(s) identified: No, Describe:  None identified   New Short Term/Long Term Goal(s):  elimination of symptoms of psychosis, medication management for mood stabilization; elimination of SI thoughts; development of comprehensive mental wellness plan.   Patient Goals:  " I want to go"   Discharge Plan or Barriers: CSW to assist with discharge planning   Reason for Continuation of Hospitalization: Medication stabilization  Estimated Length of Stay: 1 to 7 days   Last 3 [place] Suicide Severity Risk Score: Flowsheet Row Admission (Current) from 03/10/2023 in [facility] BEHAVIORAL MEDICINE ED to  Hosp-Admission (Discharged) from 02/27/2023 in [place] [facility] [place] [facility] ED from 02/26/2023 in [facility] Emergency Department at [facility]  C-SSRS RISK CATEGORY No Risk No Risk No Risk       Last [facility] 2/9 Scores:     No data to display          Scribe for Treatment Team: Elza Rafter, Theresia Majors 03/11/2023 12:03 PM

## 2023-03-11 NOTE — Group Note (Signed)
Recreation Therapy Group Note   Group Topic:Communication  Group Date: 03/11/2023 Start Time: 1400 End Time: 1445 Facilitators: Rosina Lowenstein, LRT, CTRS Location:  Dayroom  Group Description: Emotional Check in. Patient sat and talked with LRT about how they are doing and whatever else is on their mind. LRT provided active listening, reassurance and encouragement. Pts were given the opportunity to listen to music or color mandalas while they talk.     Goal Area(s) Addressed:  Patient will engage in conversation with LRT.  Patient will communicate their wants, needs, or questions.   Patient will practice a new coping skill of "talking to someone".    Affect/Mood: N/A   Participation Level: Did not attend    Clinical Observations/Individualized Feedback: Patient did not attend group.  Plan: Continue to engage patient in RT group sessions 2-3x/week.   Rosina Lowenstein, LRT, CTRS 03/11/2023 3:49 PM

## 2023-03-11 NOTE — BHH Counselor (Signed)
CSW completed chart review.   CSW finds that DSS had been working with pt in December 2025.   CSW contacted Sharlee Blew, [phone], caseworker at DSS,   [name] reports that pt does not have a legal guardian and there are no open cases for pt with DSS.   [name] reports that pt's sister is very helpful and is listed on his bank accounts and can assist CSW with further information  CSW will inform provider that pt has not legal guardian.   Reynaldo Minium, MSW, [place] 03/11/2023 2:11 PM

## 2023-03-11 NOTE — BHH Suicide Risk Assessment (Signed)
[facility] Admission Suicide Risk Assessment   Nursing information obtained from:  Patient Demographic factors:  Age 83 or older, Unemployed, Low socioeconomic status Current Mental Status:  NA Loss Factors:  Decline in physical health, Financial problems / change in socioeconomic status, Loss of significant relationship, Legal issues Historical Factors:  NA Risk Reduction Factors:  NA  Total Time spent with patient: 30 minutes Principal Problem: Schizophrenia (HCC) Diagnosis:  Principal Problem:   Schizophrenia (HCC)  Subjective Data: [name] is a 83 y.o. male admitted: Medically for 02/27/2023  9:59 AM for acute kidney injury, altered mental status. He carries the psychiatric diagnoses of schizophrenia and has a past medical history of protein-calorie malnutrition, neurosyphilis, hearing loss, hepatitis C, anemia, BPH, and repeated urinary tract infections in a male.   His current presentation of altered mental status is most consistent with schizoaffective disorder with medication non-adherence. He meets criteria for schizophrenia based on historical chart data, current disorganization.   Continued Clinical Symptoms:  Alcohol Use Disorder Identification Test Final Score (AUDIT): 0 The "Alcohol Use Disorders Identification Test", Guidelines for Use in Primary Care, Second Edition.  World Science writer [facility]). Score between 0-7:  no or low risk or alcohol related problems. Score between 8-15:  moderate risk of alcohol related problems. Score between 16-19:  high risk of alcohol related problems. Score 20 or above:  warrants further diagnostic evaluation for alcohol dependence and treatment.   CLINICAL FACTORS:   Schizophrenia:   Paranoid or undifferentiated type   Musculoskeletal: Strength & Muscle Tone: decreased Gait & Station: unsteady Patient leans: N/A  Psychiatric Specialty Exam:  Presentation  General Appearance:  Bizarre  Eye Contact: Fair  Speech: Normal  Rate  Speech Volume: Normal  Handedness: Right   Mood and Affect  Mood: Irritable  Affect: Constricted   Thought Process  Thought Processes: Disorganized  Descriptions of Associations:Loose  Orientation:Partial  Thought Content:Illogical; Scattered  History of Schizophrenia/Schizoaffective disorder:Yes  Duration of Psychotic Symptoms:Greater than six months  Hallucinations:Hallucinations: None  Ideas of Reference:Paranoia  Suicidal Thoughts:Suicidal Thoughts: No  Homicidal Thoughts:Homicidal Thoughts: No   Sensorium  Memory: Immediate Fair; Recent Fair; Remote Poor  Judgment: Impaired  Insight: Shallow   Executive Functions  Concentration: Poor  Attention Span: Fair  Recall: [employer] of Knowledge: Fair  Language: Fair   Psychomotor Activity  Psychomotor Activity: Psychomotor Activity: Normal   Assets  Assets: Communication Skills; Desire for Improvement; Resilience   Sleep  Sleep: Sleep: Fair    Physical Exam: Physical Exam ROS Blood pressure 124/71, pulse 62, temperature 97.7 F (36.5 C), resp. rate 18, height 5\' 8"  (1.727 m), weight 75.5 kg, SpO2 100%. Body mass index is 25.32 kg/m.   COGNITIVE FEATURES THAT CONTRIBUTE TO RISK:  Thought constriction (tunnel vision)    SUICIDE RISK:   Minimal: No identifiable suicidal ideation.  Patients presenting with no risk factors but with morbid ruminations; may be classified as minimal risk based on the severity of the depressive symptoms  PLAN OF CARE: Patient is admitted to geropsych inpatient facility.  Multidisciplinary team approach is offered.  Medication management, milieu/group therapy is offered  I certify that inpatient services furnished can reasonably be expected to improve the patient's condition.   Verner Chol, MD 03/11/2023, 4:16 PM

## 2023-03-11 NOTE — Progress Notes (Signed)
Patient is an involuntary admission to [facility] with hx of paranoid schizophrenia. Patient is a 1:1 sitter case d/t fall risk while in the hospital.  He is very hard of hearing with no hearing aid and poor vision.  He denies SI, HI, AVH, anxiety and depression.  States he goes to the [name] and lives with his sister who is the payee.  Would like to discharge home to his sisters.  Was relatively calm and cooperative with staff.  Does little interaction with peers.  Yells when he talks d/t his being hard of hearing.  Will continue to monitor.

## 2023-03-11 NOTE — BHH Counselor (Signed)
CSW contacted [name], [phone] ,  pt sister for collateral.   Unable to reach, left HIPAA compliant VM requesting return call.   Reynaldo Minium, MSW, [place] 03/11/2023 2:18 PM

## 2023-03-11 NOTE — Progress Notes (Signed)
Pt alert and disoriented, only oriented to self.  Pt compliant with eating, accepting his medications and delusional. Pt is managed with 1:1 sitter at bedside.  03/10/23 2000  Psych Admission Type (Psych Patients Only)  Admission Status Involuntary  Psychosocial Assessment  Patient Complaints Anxiety  Eye Contact Brief  Facial Expression Anxious  Affect Anxious  Speech Tangential  Interaction Assertive  Motor Activity Slow  Appearance/Hygiene In scrubs  Thought Process  Coherency Disorganized  Content Preoccupation  Delusions Somatic  Perception WDL  Hallucination None reported or observed  Judgment WDL  Confusion Mild  Danger to Self  Current suicidal ideation? Denies  Danger to Others  Danger to Others None reported or observed

## 2023-03-12 DIAGNOSIS — F200 Paranoid schizophrenia: Secondary | ICD-10-CM | POA: Diagnosis not present

## 2023-03-12 NOTE — Progress Notes (Signed)
[facility] MD Progress Note  03/12/2023 11:07 AM [name]  MRN:  [id number] [name] is a 83 y.o. male admitted: Medically for 02/27/2023  9:59 AM for acute kidney injury, altered mental status. He carries the psychiatric diagnoses of schizophrenia and has a past medical history of protein-calorie malnutrition, neurosyphilis, hearing loss, hepatitis C, anemia, BPH, and repeated urinary tract infections in a male. His current presentation of altered mental status is most consistent with schizoaffective disorder with medication non-adherence. He meets criteria for schizophrenia based on historical chart data, current disorganization   Subjective:  Chart reviewed, case discussed in multidisciplinary meeting, patient seen during rounds.  Today on interview patient is noted to be resting in his room with a one-to-one sitter at due to his confusion and unstable gait.  Patient with disorganized and talks about being in jail and compares himself with some rapper.  He denies SI/HI/intent/plan.  She denies auditory/visual hallucinations.  He is taking his medications with no reported side effects.  He is hard of hearing and speaks very loudly.  Per nursing report no behavioral problems and he needs assistance with mobility.   Sleep: Fair  Appetite:  Fair  Past Psychiatric History: see h&P Family History: History reviewed. No pertinent family history. Social History:  Social History   Substance and Sexual Activity  Alcohol Use No     Social History   Substance and Sexual Activity  Drug Use No    Social History   Socioeconomic History   Marital status: Single    Spouse name: Not on file   Number of children: Not on file   Years of education: Not on file   Highest education level: Not on file  Occupational History   Not on file  Tobacco Use   Smoking status: Former    Current packs/day: 0.00    Average packs/day: 1 pack/day for 20.0 years (20.0 ttl pk-yrs)    Types: Cigarettes    Start  date: 06/11/1955    Quit date: 06/11/1975    Years since quitting: 47.7   Smokeless tobacco: Never  Vaping Use   Vaping status: Never Used  Substance and Sexual Activity   Alcohol use: No   Drug use: No   Sexual activity: Not on file  Other Topics Concern   Not on file  Social History Narrative   Not on file   Social Drivers of Health   Financial Resource Strain: Not on file  Food Insecurity: Patient Declined (03/10/2023)   Hunger Vital Sign    Worried About Running Out of Food in the Last Year: Patient declined    Ran Out of Food in the Last Year: Patient declined  Recent Concern: Food Insecurity - Food Insecurity Present (02/28/2023)   Hunger Vital Sign    Worried About Running Out of Food in the Last Year: Often true    Ran Out of Food in the Last Year: Often true  Transportation Needs: Patient Declined (03/10/2023)   PRAPARE - Administrator, Civil Service (Medical): Patient declined    Lack of Transportation (Non-Medical): Patient declined  Physical Activity: Not on file  Stress: Not on file  Social Connections: Patient Declined (03/10/2023)   Social Connection and Isolation Panel [NHANES]    Frequency of Communication with Friends and Family: Patient declined    Frequency of Social Gatherings with Friends and Family: Patient declined    Attends Religious Services: Patient declined    Database administrator or Organizations:  Patient declined    Attends Banker Meetings: Patient declined    Marital Status: Patient declined   Past Medical History:  Past Medical History:  Diagnosis Date   Acute cystitis without hematuria 02/28/2023   AKI (acute kidney injury) (HCC) 02/27/2023   AMS (altered mental status) 01/06/2023   Anemia 08/31/2012   Benign hypertrophy of prostate    Dysphagia 03/01/2023   Hepatitis C    Hypokalemia 02/06/2013   Schizophrenia (HCC)    Swelling of lower extremity 01/13/2023   Urinary tract infection    Urinary tract  infection 06/13/2011   IMO SNOMED Dx Update Oct 2024     UTI (urinary tract infection) 06/13/2011   IMO SNOMED Dx Update Oct 2024      Past Surgical History:  Procedure Laterality Date   KNEE SURGERY     right    Current Medications: Current Facility-Administered Medications  Medication Dose Route Frequency Provider Last Rate Last Admin   diltiazem (CARDIZEM CD) 24 hr capsule 180 mg  180 mg Oral Daily Mariel Craft, MD   180 mg at 03/12/23 0930   feeding supplement (ENSURE ENLIVE / ENSURE PLUS) liquid 237 mL  237 mL Oral BID BM Mariel Craft, MD   237 mL at 03/11/23 0837   haloperidol (HALDOL) tablet 10 mg  10 mg Oral BID Mariel Craft, MD   10 mg at 03/12/23 1610   haloperidol (HALDOL) tablet 5 mg  5 mg Oral Q8H PRN Mariel Craft, MD       Or   haloperidol lactate (HALDOL) injection 5 mg  5 mg Intramuscular Q8H PRN Mariel Craft, MD       [START ON 03/17/2023] haloperidol decanoate (HALDOL DECANOATE) 100 MG/ML injection 100 mg  100 mg Intramuscular Once Mariel Craft, MD       multivitamin with minerals tablet 1 tablet  1 tablet Oral Daily Mariel Craft, MD   1 tablet at 03/12/23 9604   Oral care mouth rinse  15 mL Mouth Rinse PRN Mariel Craft, MD       tamsulosin [facility]) capsule 0.4 mg  0.4 mg Oral Daily Mariel Craft, MD   0.4 mg at 03/12/23 5409   thiamine (VITAMIN B1) tablet 100 mg  100 mg Oral Daily Mariel Craft, MD   100 mg at 03/12/23 8119    Lab Results: No results found for this or any previous visit (from the past 48 hours).  Blood Alcohol level:  Lab Results  Component Value Date   ETH <10 02/08/2023   ETH <10 01/06/2023    Metabolic Disorder Labs: No results found for: "HGBA1C", "MPG" No results found for: "PROLACTIN" No results found for: "CHOL", "TRIG", "HDL", "CHOLHDL", "VLDL", "LDLCALC"  Physical Findings: AIMS:  , ,  ,  ,    CIWA:    COWS:      Psychiatric Specialty Exam:  Presentation  General Appearance:   Appropriate for Environment; Disheveled  Eye Contact: Fleeting  Speech: Blocked  Speech Volume: Decreased    Mood and Affect  Mood: Irritable; Labile  Affect: Labile; Restricted   Thought Process  Thought Processes: Disorganized  Descriptions of Associations:Loose  Orientation:Partial  Thought Content:Paranoid Ideation  Hallucinations:Hallucinations: None  Ideas of Reference:None  Suicidal Thoughts:Suicidal Thoughts: No  Homicidal Thoughts:Homicidal Thoughts: No   Sensorium  Memory: Immediate Fair; Recent Fair; Remote Poor  Judgment: Impaired  Insight: Shallow   Executive Functions  Concentration: Poor  Attention Span:  Fair  Recall: Jennelle Human of Knowledge: Fair  Language: Fair   Psychomotor Activity  Psychomotor Activity: Psychomotor Activity: Normal  Musculoskeletal: Strength & Muscle Tone: within normal limits Gait & Station: unsteady Assets  Assets: Desire for Improvement; Resilience    Physical Exam: Physical Exam ROS Blood pressure 123/65, pulse 72, temperature (!) 97.5 F (36.4 C), temperature source Oral, resp. rate 20, height 5\' 8"  (1.727 m), weight 75.5 kg, SpO2 100%. Body mass index is 25.32 kg/m.  Diagnosis: Principal Problem:   Schizophrenia [facility])   PLAN:Clinical Decision Making: Patient with history of schizoaffective disorder, currently not taking his medications, lacks insight, very paranoid and aggressive towards the staff, disorganized thought process, thinking about the hospital as a jail and demanding for an attorney.  He needs inpatient psychiatric hospitalization for stabilization  Treatment Plan Summary:  Safety and Monitoring:             -- Involuntary admission to inpatient psychiatric unit for safety, stabilization and treatment             -- Daily contact with patient to assess and evaluate symptoms and progress in treatment             -- Patient's case to be discussed in  multi-disciplinary team meeting             -- Observation Level: q15 minute checks             -- Vital signs:  q12 hours             -- Precautions: suicide, elopement, and assault   2. Psychiatric Diagnoses and Treatment:               Continue Haldol 10 mg twice daily per chart his next dose of Haldol Decanoate is due 03-17-2023   -- The risks/benefits/side-effects/alternatives to this medication were discussed in detail with the patient and time was given for questions. The patient consents to medication trial.                -- Metabolic profile and EKG monitoring obtained while on an atypical antipsychotic (BMI: Lipid Panel: HbgA1c: QTc:)              -- Encouraged patient to participate in unit milieu and in scheduled group therapies                            3. Medical Issues Being Addressed:  No urgent medical needs at this time   4. Discharge Planning:              -- Social work and case management to assist with discharge planning and identification of hospital follow-up needs prior to discharge             -- Estimated LOS: 5-7 days             -- Discharge Concerns: Need to establish a safety plan; Medication compliance and effectiveness             -- Discharge Goals: Return home with outpatient referrals follow ups  Verner Chol, MD 03/12/2023, 11:07 AM

## 2023-03-12 NOTE — Progress Notes (Signed)
   03/12/23 0708  15 Minute Checks  Location Bedroom  Visual Appearance Calm  Behavior Sleeping  Sleep (Behavioral Health Patients Only)  Calculate sleep? (Click Yes once per 24 hr at 0600 safety check) Yes  Documented sleep last 24 hours 10

## 2023-03-12 NOTE — Plan of Care (Signed)
   Problem: Activity: Goal: Interest or engagement in activities will improve Outcome: Progressing   Problem: Health Behavior/Discharge Planning: Goal: Compliance with treatment plan for underlying cause of condition will improve Outcome: Progressing   Problem: Safety: Goal: Periods of time without injury will increase Outcome: Progressing

## 2023-03-12 NOTE — Group Note (Signed)
LCSW Group Therapy Note  Group Date: 03/12/2023 Start Time: 1310 End Time: 1350   Type of Therapy and Topic:  Group Therapy: Positive Affirmations  Participation Level:  Minimal   Description of Group:   This group addressed positive affirmation towards self and others.  Patients went around the room and identified two positive things about themselves and two positive things about a peer in the room.  Patients reflected on how it felt to share something positive with others, to identify positive things about themselves, and to hear positive things from others/ Patients were encouraged to have a daily reflection of positive characteristics or circumstances.   Therapeutic Goals: Patients will verbalize two of their positive qualities Patients will demonstrate empathy for others by stating two positive qualities about a peer in the group Patients will verbalize their feelings when voicing positive self affirmations and when voicing positive affirmations of others Patients will discuss the potential positive impact on their wellness/recovery of focusing on positive traits of self and others.  Summary of Patient Progress:  Patient did not actively engage in the discussion but slept throughout. Patient was unable to identify positive affirmations about himself as well as other group members. Patient demonstrated no insight into the subject matter, was respectful of peers, and did not participate during the entire session.  Therapeutic Modalities:   Cognitive Behavioral Therapy Motivational Interviewing    [name], LCSWA 03/12/2023  3:52 PM

## 2023-03-12 NOTE — BHH Counselor (Signed)
Adult Comprehensive Assessment  Patient ID: [name], male   DOB: 02-08-1940, 83 y.o.   MRN: [id number]  Information Source: Information source: Patient  Current Stressors:  Patient states their primary concerns and needs for treatment are:: Pt. stated he was "arrested for robbery". When asked if he had ever been given a mental health Dx he stated "Paranoid Schizophrenia." Patient states their goals for this hospitilization and ongoing recovery are:: Get stabilized on medication Financial / Lack of resources (include bankruptcy): Pt. reported he receives SSI but that he does not have his card. Housing / Lack of housing: Pt. reported he lives with sister Social relationships: none Substance abuse: Pt. reported the previous use of alcohol and substances.  Living/Environment/Situation:  Living Arrangements: Other relatives What is atmosphere in current home: Temporary  Family History:  Marital status: Single Does patient have children?: No  Childhood History:  By whom was/is the patient raised?: Other (Comment) (Pt stated "I raised myself") Does patient have siblings?: Yes Number of Siblings: 4 (3 brothers and 1 sister) Did patient suffer any verbal/emotional/physical/sexual abuse as a child?: No Did patient suffer from severe childhood neglect?: No Has patient ever been sexually abused/assaulted/raped as an adolescent or adult?: No  Education:  Highest grade of school patient has completed: Graduated high school Currently a student?: No  Employment/Work Situation:   Patient's Job has Been Impacted by Current Illness: No Has Patient ever Been in the [employer]?: Yes (Describe in comment) ([employer] 3 years, Chief Strategy Officer) Did You Receive Any Psychiatric Treatment/Services While in the [employer]?: No  Financial Resources:   Financial resources: Receives SSI Does patient have a Lawyer or guardian?: No  Alcohol/Substance Abuse:   What has been your use of drugs/alcohol  within the last 12 months?: none If attempted suicide, did drugs/alcohol play a role in this?: No Alcohol/Substance Abuse Treatment Hx: Past Tx, Inpatient  Social Support System:   Type of faith/religion: Christian  Leisure/Recreation:      Strengths/Needs:   Patient states these barriers may affect/interfere with their treatment: No transportation Patient states these barriers may affect their return to the community: No transportation Other important information patient would like considered in planning for their treatment: Pt was highly disorganized and had a hard time answering questions coherently  Discharge Plan:   Currently receiving community mental health services: No Does patient have access to transportation?: No Does patient have financial barriers related to discharge medications?: Yes Patient description of barriers related to discharge medications: Needs his SSI card Plan for no access to transportation at discharge: Yes Will patient be returning to same living situation after discharge?: Yes  Summary/Recommendations:   Summary and Recommendations (to be completed by the evaluator): [name] is 83 y.o. African American male admitted: Medically for 02/27/2023 9:59 AM for acute kidney injury, altered mental status. He carries the psychiatric diagnoses of schizophrenia and has a past medical history of protein-calorie malnutrition, neurosyphilis, hearing loss, hepatitis C, anemia, BPH, and repeated urinary tract infections in a male.  His current presentation of altered mental status is most consistent with schizoaffective disorder with medication non-adherence. He meets criteria for schizophrenia based on historical chart data, current disorganization.  He was not compliant with medications prior to admission as evidenced by current disorganization and patient report. Patient reported living with sister prior but is uncertain if he can return there due to legal issues with  potential robbery charges. The patient reports previous substance/alcohol use but could not define how long it has  been or what kind of treatment if any he received. Patient will need transportation once he is discharged. Additional recommendations include crisis stabilization, therapeutic milieu, encourage group attendance and participation, medication management for mood stabilization (if indicated), and development of a comprehensive mental wellness plan.  Azucena Kuba. 03/12/2023

## 2023-03-12 NOTE — Progress Notes (Signed)
Pt is disorganized and confused. Irritable and easily agitated.  HOH. Loud and yelling. Pt is fixated on getting his EBT card states "I need my money and I don't know where my EBT card is going". Support and encouragement provided. Po med compliant. Q 15 min checks provided. 1:1 in place for falls risk. Denies SI, HI, AVH, and pain. No c/o pain/discomfort noted.

## 2023-03-12 NOTE — Plan of Care (Signed)

## 2023-03-12 NOTE — Progress Notes (Signed)
 1:1 sitter ordered for patient safety.    Patient pleasant and cooperative.  Flat affect.  Patient appeared tired, kept closing eyes to fall back asleep.  Endorses sadness and depression.  Denies SI/HI and AVH.  Denies feelings of anxiety.  Denies pain.    Compliant with scheduled medications. 15 min checks in place for safety.  Patient present in the milieu.  No interaction with peers.    Patient to nurse's station requesting medication for constipation.  Dr. Shela Commons made aware.  Pt given prune juice.  Pt is irritable, but verbally redirectable.

## 2023-03-13 DIAGNOSIS — F200 Paranoid schizophrenia: Secondary | ICD-10-CM | POA: Diagnosis not present

## 2023-03-13 MED ORDER — BENZTROPINE MESYLATE 1 MG PO TABS: 0.5000 mg | ORAL_TABLET | Freq: Two times a day (BID) | ORAL | Status: DC | PRN

## 2023-03-13 NOTE — Plan of Care (Signed)

## 2023-03-13 NOTE — Progress Notes (Signed)
 1:1 Sitter in place for safety.   Patient is cooperative, but easily irritated.  Mildly confused. Hard of hearing and speaks loudly. Flat affect  Endorses depression.  Denies SI/HI and AVH.  Pain rated 4/10 in legs and feet.  Dr. Shela Commons made aware.   Sitter reports he slept well.   Compliant with scheduled medications.  15 min checks in place for safety.  Patient is present in the milieu for meals, but returns to his room shortly after eating.  More time in milieu after dinner.  1:1 sitter remained with patient throughout shift.

## 2023-03-13 NOTE — Plan of Care (Signed)
[name] is a 83 y.o. male patient. No diagnosis found. Past Medical History:  Diagnosis Date   Acute cystitis without hematuria 02/28/2023   AKI (acute kidney injury) (HCC) 02/27/2023   AMS (altered mental status) 01/06/2023   Anemia 08/31/2012   Benign hypertrophy of prostate    Dysphagia 03/01/2023   Hepatitis C    Hypokalemia 02/06/2013   Schizophrenia (HCC)    Swelling of lower extremity 01/13/2023   Urinary tract infection    Urinary tract infection 06/13/2011   IMO SNOMED Dx Update Oct 2024     UTI (urinary tract infection) 06/13/2011   IMO SNOMED Dx Update Oct 2024     Current Facility-Administered Medications  Medication Dose Route Frequency Provider Last Rate Last Admin   diltiazem (CARDIZEM CD) 24 hr capsule 180 mg  180 mg Oral Daily Mariel Craft, MD   180 mg at 03/12/23 0930   feeding supplement (ENSURE ENLIVE / ENSURE PLUS) liquid 237 mL  237 mL Oral BID BM Mariel Craft, MD   237 mL at 03/12/23 1414   haloperidol (HALDOL) tablet 10 mg  10 mg Oral BID Mariel Craft, MD   10 mg at 03/12/23 2129   haloperidol (HALDOL) tablet 5 mg  5 mg Oral Q8H PRN Mariel Craft, MD       Or   haloperidol lactate (HALDOL) injection 5 mg  5 mg Intramuscular Q8H PRN Mariel Craft, MD       [START ON 03/17/2023] haloperidol decanoate (HALDOL DECANOATE) 100 MG/ML injection 100 mg  100 mg Intramuscular Once Mariel Craft, MD       multivitamin with minerals tablet 1 tablet  1 tablet Oral Daily Mariel Craft, MD   1 tablet at 03/12/23 0981   Oral care mouth rinse  15 mL Mouth Rinse PRN Mariel Craft, MD       tamsulosin [facility]) capsule 0.4 mg  0.4 mg Oral Daily Mariel Craft, MD   0.4 mg at 03/12/23 1914   thiamine (VITAMIN B1) tablet 100 mg  100 mg Oral Daily Mariel Craft, MD   100 mg at 03/12/23 7829   No Known Allergies Principal Problem:   Schizophrenia (HCC)  Blood pressure 113/65, pulse 88, temperature 98.3 F (36.8 C), resp. rate 17, height 5'  8" (1.727 m), weight 75.5 kg, SpO2 100%.  Subjective Objective Assessment & Plan  Rodnisha Blomgren B Kenedi Cilia 03/13/2023

## 2023-03-13 NOTE — Progress Notes (Signed)
[facility] MD Progress Note  03/13/2023 1:43 PM [name]  MRN:  [id number] [name] is a 83 y.o. male admitted: Medically for 02/27/2023  9:59 AM for acute kidney injury, altered mental status. He carries the psychiatric diagnoses of schizophrenia and has a past medical history of protein-calorie malnutrition, neurosyphilis, hearing loss, hepatitis C, anemia, BPH, and repeated urinary tract infections in a male. His current presentation of altered mental status is most consistent with schizoaffective disorder with medication non-adherence. He meets criteria for schizophrenia based on historical chart data, current disorganization   Subjective:  Chart reviewed, case discussed in multidisciplinary meeting, patient seen during rounds.  Today on interview patient is noted to be sleeping in his bed.  He woke up suddenly on verbal command of the provider but is noted to be sleepy with his eyes closed.  He answered the questions intermittently and remains disorganized based when asked about auditory hallucinations he started yelling about needing his debit card right away he reports feeling depressed all his life when asked about suicidal thoughts he says no different than [place] chart.  Per nursing staff he is taking his medications.  He reports pain in his legs and feet.  Will consider adding Cogentin as needed to see if it is EPS   Sleep: Fair  Appetite:  Fair  Past Psychiatric History: see h&P Family History: History reviewed. No pertinent family history. Social History:  Social History   Substance and Sexual Activity  Alcohol Use No     Social History   Substance and Sexual Activity  Drug Use No    Social History   Socioeconomic History   Marital status: Single    Spouse name: Not on file   Number of children: Not on file   Years of education: Not on file   Highest education level: Not on file  Occupational History   Not on file  Tobacco Use   Smoking status: Former    Current  packs/day: 0.00    Average packs/day: 1 pack/day for 20.0 years (20.0 ttl pk-yrs)    Types: Cigarettes    Start date: 06/11/1955    Quit date: 06/11/1975    Years since quitting: 47.7   Smokeless tobacco: Never  Vaping Use   Vaping status: Never Used  Substance and Sexual Activity   Alcohol use: No   Drug use: No   Sexual activity: Not on file  Other Topics Concern   Not on file  Social History Narrative   Not on file   Social Drivers of Health   Financial Resource Strain: Not on file  Food Insecurity: Patient Declined (03/10/2023)   Hunger Vital Sign    Worried About Running Out of Food in the Last Year: Patient declined    Ran Out of Food in the Last Year: Patient declined  Recent Concern: Food Insecurity - Food Insecurity Present (02/28/2023)   Hunger Vital Sign    Worried About Running Out of Food in the Last Year: Often true    Ran Out of Food in the Last Year: Often true  Transportation Needs: Patient Declined (03/10/2023)   PRAPARE - Administrator, Civil Service (Medical): Patient declined    Lack of Transportation (Non-Medical): Patient declined  Physical Activity: Not on file  Stress: Not on file  Social Connections: Patient Declined (03/10/2023)   Social Connection and Isolation Panel [NHANES]    Frequency of Communication with Friends and Family: Patient declined    Frequency of  Social Gatherings with Friends and Family: Patient declined    Attends Religious Services: Patient declined    Database administrator or Organizations: Patient declined    Attends Banker Meetings: Patient declined    Marital Status: Patient declined   Past Medical History:  Past Medical History:  Diagnosis Date   Acute cystitis without hematuria 02/28/2023   AKI (acute kidney injury) (HCC) 02/27/2023   AMS (altered mental status) 01/06/2023   Anemia 08/31/2012   Benign hypertrophy of prostate    Dysphagia 03/01/2023   Hepatitis C    Hypokalemia 02/06/2013    Schizophrenia (HCC)    Swelling of lower extremity 01/13/2023   Urinary tract infection    Urinary tract infection 06/13/2011   IMO SNOMED Dx Update Oct 2024     UTI (urinary tract infection) 06/13/2011   IMO SNOMED Dx Update Oct 2024      Past Surgical History:  Procedure Laterality Date   KNEE SURGERY     right    Current Medications: Current Facility-Administered Medications  Medication Dose Route Frequency Provider Last Rate Last Admin   diltiazem (CARDIZEM CD) 24 hr capsule 180 mg  180 mg Oral Daily Mariel Craft, MD   180 mg at 03/13/23 0845   feeding supplement (ENSURE ENLIVE / ENSURE PLUS) liquid 237 mL  237 mL Oral BID BM Mariel Craft, MD   237 mL at 03/12/23 1414   haloperidol (HALDOL) tablet 10 mg  10 mg Oral BID Mariel Craft, MD   10 mg at 03/13/23 0845   haloperidol (HALDOL) tablet 5 mg  5 mg Oral Q8H PRN Mariel Craft, MD       Or   haloperidol lactate (HALDOL) injection 5 mg  5 mg Intramuscular Q8H PRN Mariel Craft, MD       [START ON 03/17/2023] haloperidol decanoate (HALDOL DECANOATE) 100 MG/ML injection 100 mg  100 mg Intramuscular Once Mariel Craft, MD       multivitamin with minerals tablet 1 tablet  1 tablet Oral Daily Mariel Craft, MD   1 tablet at 03/13/23 0845   Oral care mouth rinse  15 mL Mouth Rinse PRN Mariel Craft, MD       tamsulosin [facility]) capsule 0.4 mg  0.4 mg Oral Daily Mariel Craft, MD   0.4 mg at 03/13/23 1610   thiamine (VITAMIN B1) tablet 100 mg  100 mg Oral Daily Mariel Craft, MD   100 mg at 03/13/23 9604    Lab Results: No results found for this or any previous visit (from the past 48 hours).  Blood Alcohol level:  Lab Results  Component Value Date   ETH <10 02/08/2023   ETH <10 01/06/2023    Metabolic Disorder Labs: No results found for: "HGBA1C", "MPG" No results found for: "PROLACTIN" No results found for: "CHOL", "TRIG", "HDL", "CHOLHDL", "VLDL", "LDLCALC"  Physical Findings: AIMS:  , ,  ,   ,    CIWA:    COWS:      Psychiatric Specialty Exam:  Presentation  General Appearance:  Appropriate for Environment; Bizarre  Eye Contact: Fleeting  Speech: Blocked  Speech Volume: Decreased    Mood and Affect  Mood: Depressed  Affect: Flat; Restricted   Thought Process  Thought Processes: Disorganized  Descriptions of Associations:Tangential  Orientation:Partial  Thought Content:Illogical; Tangential  Hallucinations:Hallucinations: None  Ideas of Reference:None  Suicidal Thoughts:Suicidal Thoughts: No  Homicidal Thoughts:Homicidal Thoughts: No   Sensorium  Memory: Immediate Fair; Recent Poor; Remote Poor  Judgment: Impaired  Insight: Shallow   Executive Functions  Concentration: Poor  Attention Span: Poor  Recall: Poor  Fund of Knowledge: Fair  Language: Fair   Psychomotor Activity  Psychomotor Activity: Psychomotor Activity: Normal  Musculoskeletal: Strength & Muscle Tone: within normal limits Gait & Station: unsteady Assets  Assets: Manufacturing systems engineer; Physical Health; Resilience    Physical Exam: Physical Exam ROS Blood pressure 114/73, pulse 78, temperature 98.1 F (36.7 C), resp. rate 18, height 5\' 8"  (1.727 m), weight 75.5 kg, SpO2 100%. Body mass index is 25.32 kg/m.  Diagnosis: Principal Problem:   Schizophrenia [facility])   PLAN:Clinical Decision Making: Patient with history of schizoaffective disorder, currently not taking his medications, lacks insight, very paranoid and aggressive towards the staff, disorganized thought process, thinking about the hospital as a jail and demanding for an attorney.  He needs inpatient psychiatric hospitalization for stabilization  Treatment Plan Summary:  Safety and Monitoring:             -- Involuntary admission to inpatient psychiatric unit for safety, stabilization and treatment             -- Daily contact with patient to assess and evaluate symptoms and progress  in treatment             -- Patient's case to be discussed in multi-disciplinary team meeting             -- Observation Level: q15 minute checks             -- Vital signs:  q12 hours             -- Precautions: suicide, elopement, and assault   2. Psychiatric Diagnoses and Treatment:               Continue Haldol 10 mg twice daily per chart his next dose of Haldol Decanoate is due 03-17-2023 Add Cogentin 0.5 mg twice daily as needed for EPS -- The risks/benefits/side-effects/alternatives to this medication were discussed in detail with the patient and time was given for questions. The patient consents to medication trial.                -- Metabolic profile and EKG monitoring obtained while on an atypical antipsychotic (BMI: Lipid Panel: HbgA1c: QTc:)              -- Encouraged patient to participate in unit milieu and in scheduled group therapies                            3. Medical Issues Being Addressed:  No urgent medical needs at this time   4. Discharge Planning:              -- Social work and case management to assist with discharge planning and identification of hospital follow-up needs prior to discharge             -- Estimated LOS: 5-7 days             -- Discharge Concerns: Need to establish a safety plan; Medication compliance and effectiveness             -- Discharge Goals: Return home with outpatient referrals follow ups  Verner Chol, MD 03/13/2023, 1:43 PM

## 2023-03-13 NOTE — Plan of Care (Signed)
  Problem: Self-Concept: Goal: Level of anxiety will decrease Outcome: Progressing   Problem: Activity: Goal: Interest or engagement in leisure activities will improve Outcome: Not Progressing

## 2023-03-13 NOTE — Progress Notes (Signed)
Pt reported no pain during the shift. Pt. has a sitter and sleft throughtout the night. Pt denies experiencing any SI/HI, or AVH during the shift. Pt. seem to not know his age as he constantly verbalized that he was 83 years old. Scheduled medications administered  per Doctor orders. Frequent contact made.

## 2023-03-14 NOTE — [facility] Suicide Risk Assessment (Signed)
[facility] INPATIENT:  Family/Significant Other Suicide Prevention Education  Suicide Prevention Education:  Education Completed; [name], sister, [phone] ,  (name of family member/significant other) has been identified by the patient as the family member/significant other with whom the patient will be residing, and identified as the person(s) who will aid the patient in the event of a mental health crisis (suicidal ideations/suicide attempt).  With written consent from the patient, the family member/significant other has been provided the following suicide prevention education, prior to the and/or following the discharge of the patient.  The suicide prevention education provided includes the following: Suicide risk factors Suicide prevention and interventions National Suicide Hotline telephone number [facility] assessment telephone number [facility] Emergency Assistance 911 [facility] and/or Residential Mobile Crisis Unit telephone number  Request made of family/significant other to: Remove weapons (e.g., guns, rifles, knives), all items previously/currently identified as safety concern.   Remove drugs/medications (over-the-counter, prescriptions, illicit drugs), all items previously/currently identified as a safety concern.  The family member/significant other verbalizes understanding of the suicide prevention education information provided.  The family member/significant other agrees to remove the items of safety concern listed above.  [name] 03/14/2023, 10:22 AM

## 2023-03-14 NOTE — Progress Notes (Signed)
   03/14/23 2100  Psych Admission Type (Psych Patients Only)  Admission Status Involuntary  Psychosocial Assessment  Patient Complaints None  Eye Contact Brief  Facial Expression Flat  Affect Flat  Speech Loud;Other (Comment) (hard of hearing)  Interaction Minimal  Motor Activity Unsteady  Appearance/Hygiene In scrubs  Behavior Characteristics Cooperative  Mood Pleasant  Thought Process  Coherency Disorganized  Content Preoccupation  Delusions None reported or observed  Perception WDL  Hallucination None reported or observed  Judgment Impaired  Confusion Mild  Danger to Self  Current suicidal ideation? Denies  Danger to Others  Danger to Others None reported or observed

## 2023-03-14 NOTE — Group Note (Signed)
Recreation Therapy Group Note   Group Topic:Healthy Support Systems  Group Date: 03/14/2023 Start Time: 1500 End Time: 1550 Facilitators: Rosina Lowenstein, LRT, CTRS Location:  Dayroom  Group Description: Straw Bridge. In groups or individually, patients were given 10 plastic drinking straws and an equal length of masking tape. Using the materials provided, patients were instructed to build a free-standing bridge-like structure to suspend an everyday item (ex: deck of cards) off the floor or table surface. All materials were required to be used in Secondary school teacher. LRT facilitated post-activity discussion reviewing the importance of having strong and healthy support systems in our lives. LRT discussed how the people in our lives serve as the tape and the deck of cards we placed on top of our straw structure are the stressors we face in daily life. LRT and pts discussed what happens in our life when things get too heavy for [place], and we don't have strong supports outside of the hospital. Pt shared 2 of their healthy supports in their life aloud in the group.   Goal Area(s) Addressed:  Patient will identify 2 healthy supports in their life. Patient will identify skills to successfully complete activity. Patient will identify correlation of this activity to life post-discharge.  Patient will build on frustration tolerance skills. Patient will increase team building and communication skills.   Affect/Mood: N/A   Participation Level: Did not attend    Clinical Observations/Individualized Feedback: Haiden was present in group for less than 5 minutes.   Plan: Continue to engage patient in RT group sessions 2-3x/week.   Rosina Lowenstein, LRT, CTRS 03/14/2023 4:37 PM

## 2023-03-14 NOTE — [facility] Counselor (Signed)
  CSW contacted [name] [phone]), pt's sister to complete SPE.   Corrie Dandy reports that pt has lived with her on and off for years. She reports that he usually stays with her for a couple weeks at a time, off and on. She reports that lately he has been staying for longer period of times, usually months at a time.   She states, " He doesn't have good hygiene because he's always lived in the street."   She reports pt IS allowed to come back to her home as long as he is stable. She reports she does have his check and that it came in the mail a few days late.   Corrie Dandy reports that she does have a weapon, stating "No I do have a weapon, but I don't think he has any idea I have it"  She also reports it's in a closet and that the closet has a lock on it.   She reports she DOES NOT feel pt is a harm to himself or others, stating "No I can't say that, I've never seen that. He is attention seeking, but you can't ever say that. I don't think he's actively suicidal. "   CSW reports she will call sister back once pt is closer to discharge.   Mary verbalizes understanding.   Reynaldo Minium, MSW, [place] 03/14/2023 10:14 AM

## 2023-03-14 NOTE — Progress Notes (Signed)
Patient alert and awake with confusion. HOH. Speaks very loudly. States he's doing fair. Denies anxiety and depression. Amb w/ walker. Attends group. Pt requires one person assist with ADLs. Pt bed linen changed and mattress wiped down with Sani-cloth for urinary incont. Po medication given as scheduled, per MD. No behavior issues noted at this time. Q 15 min checks provided. 1:1 at the bedside for safety and falls risk. Denies SI, HI, AVH, and pain. Patient remains safe at this time.

## 2023-03-14 NOTE — Progress Notes (Signed)
Patient is an involuntary admission to [facility] for paranoid schizophrenia.  Patient denies SI, HI, AVH, anxiety and depression. He complained today of constipation and was given prune juice and apple juice with no further complaints.  He is a 1:1 sitter case d/t high fall risk.  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  Did not want to be cleaned of an incontinent episode. Finally relented and let the aide clean him and the bed.  Will continue to monitor.

## 2023-03-14 NOTE — Group Note (Signed)
Recreation Therapy Group Note   Group Topic:Health and Wellness  Group Date: 03/14/2023 Start Time: 1100 End Time: 1140 Facilitators: Rosina Lowenstein, LRT, CTRS Location: Dayroom  Group Description: Seated Exercise. LRT discussed the mental and physical benefits of exercise. LRT and group discussed how physical activity can be used as a coping skill. Pt's and LRT followed along to an exercise video on the TV screen that provided a visual representation and audio description of every exercise performed. Pt's encouraged to listen to their bodies and stop at any time if they experience feelings of discomfort or pain. Pts were encouraged to drink water and stay hydrated.   Goal Area(s) Addressed: Patient will learn benefits of physical activity. Patient will identify exercise as a coping skill.  Patient will follow multistep directions. Patient will try a new leisure interest.   Affect/Mood: N/A   Participation Level: Did not attend    Clinical Observations/Individualized Feedback: Patient did not attend group.   Plan: Continue to engage patient in RT group sessions 2-3x/week.   Rosina Lowenstein, LRT, CTRS 03/14/2023 2:03 PM

## 2023-03-14 NOTE — Progress Notes (Signed)
[facility] MD Progress Note  03/14/2023 4:50 PM [name]  MRN:  [id number] [name] is a 83 y.o. male admitted: Medically for 02/27/2023  9:59 AM for acute kidney injury, altered mental status. He carries the psychiatric diagnoses of schizophrenia and has a past medical history of protein-calorie malnutrition, neurosyphilis, hearing loss, hepatitis C, anemia, BPH, and repeated urinary tract infections in a male. His current presentation of altered mental status is most consistent with schizoaffective disorder with medication non-adherence. He meets criteria for schizophrenia based on historical chart data, current disorganization   Subjective:  Chart reviewed, case discussed in multidisciplinary meeting, patient seen during rounds. 83 year old male with a reported diagnosis of schizophrenia, noncompliance with medications was living with sister is incontinent has challenges with vision hearing. Patient is minimizing behaviors reports that he was minding his own business and police came and put him in. Patient continues to be quite, labile, will continue current treatment plan.   Sleep: Fair  Appetite:  Fair  Past Psychiatric History: see h&P Family History: History reviewed. No pertinent family history. Social History:  Social History   Substance and Sexual Activity  Alcohol Use No     Social History   Substance and Sexual Activity  Drug Use No    Social History   Socioeconomic History   Marital status: Single    Spouse name: Not on file   Number of children: Not on file   Years of education: Not on file   Highest education level: Not on file  Occupational History   Not on file  Tobacco Use   Smoking status: Former    Current packs/day: 0.00    Average packs/day: 1 pack/day for 20.0 years (20.0 ttl pk-yrs)    Types: Cigarettes    Start date: 06/11/1955    Quit date: 06/11/1975    Years since quitting: 47.7   Smokeless tobacco: Never  Vaping Use   Vaping status: Never Used   Substance and Sexual Activity   Alcohol use: No   Drug use: No   Sexual activity: Not on file  Other Topics Concern   Not on file  Social History Narrative   Not on file   Social Drivers of Health   Financial Resource Strain: Not on file  Food Insecurity: Patient Declined (03/10/2023)   Hunger Vital Sign    Worried About Running Out of Food in the Last Year: Patient declined    Ran Out of Food in the Last Year: Patient declined  Recent Concern: Food Insecurity - Food Insecurity Present (02/28/2023)   Hunger Vital Sign    Worried About Running Out of Food in the Last Year: Often true    Ran Out of Food in the Last Year: Often true  Transportation Needs: Patient Declined (03/10/2023)   PRAPARE - Administrator, Civil Service (Medical): Patient declined    Lack of Transportation (Non-Medical): Patient declined  Physical Activity: Not on file  Stress: Not on file  Social Connections: Patient Declined (03/10/2023)   Social Connection and Isolation Panel [NHANES]    Frequency of Communication with Friends and Family: Patient declined    Frequency of Social Gatherings with Friends and Family: Patient declined    Attends Religious Services: Patient declined    Database administrator or Organizations: Patient declined    Attends Banker Meetings: Patient declined    Marital Status: Patient declined   Past Medical History:  Past Medical History:  Diagnosis Date  Acute cystitis without hematuria 02/28/2023   AKI (acute kidney injury) (HCC) 02/27/2023   AMS (altered mental status) 01/06/2023   Anemia 08/31/2012   Benign hypertrophy of prostate    Dysphagia 03/01/2023   Hepatitis C    Hypokalemia 02/06/2013   Schizophrenia (HCC)    Swelling of lower extremity 01/13/2023   Urinary tract infection    Urinary tract infection 06/13/2011   IMO SNOMED Dx Update Oct 2024     UTI (urinary tract infection) 06/13/2011   IMO SNOMED Dx Update Oct 2024      Past  Surgical History:  Procedure Laterality Date   KNEE SURGERY     right    Current Medications: Current Facility-Administered Medications  Medication Dose Route Frequency Provider Last Rate Last Admin   benztropine (COGENTIN) tablet 0.5 mg  0.5 mg Oral BID PRN Verner Chol, MD       diltiazem (CARDIZEM CD) 24 hr capsule 180 mg  180 mg Oral Daily Mariel Craft, MD   180 mg at 03/14/23 0908   feeding supplement (ENSURE ENLIVE / ENSURE PLUS) liquid 237 mL  237 mL Oral BID BM Mariel Craft, MD   237 mL at 03/14/23 1500   haloperidol (HALDOL) tablet 10 mg  10 mg Oral BID Mariel Craft, MD   10 mg at 03/14/23 6045   haloperidol (HALDOL) tablet 5 mg  5 mg Oral Q8H PRN Mariel Craft, MD       Or   haloperidol lactate (HALDOL) injection 5 mg  5 mg Intramuscular Q8H PRN Mariel Craft, MD       [START ON 03/17/2023] haloperidol decanoate (HALDOL DECANOATE) 100 MG/ML injection 100 mg  100 mg Intramuscular Once Mariel Craft, MD       multivitamin with minerals tablet 1 tablet  1 tablet Oral Daily Mariel Craft, MD   1 tablet at 03/14/23 4098   Oral care mouth rinse  15 mL Mouth Rinse PRN Mariel Craft, MD       tamsulosin [facility]) capsule 0.4 mg  0.4 mg Oral Daily Mariel Craft, MD   0.4 mg at 03/14/23 1191   thiamine (VITAMIN B1) tablet 100 mg  100 mg Oral Daily Mariel Craft, MD   100 mg at 03/14/23 4782    Lab Results: No results found for this or any previous visit (from the past 48 hours).  Blood Alcohol level:  Lab Results  Component Value Date   ETH <10 02/08/2023   ETH <10 01/06/2023    Metabolic Disorder Labs: No results found for: "HGBA1C", "MPG" No results found for: "PROLACTIN" No results found for: "CHOL", "TRIG", "HDL", "CHOLHDL", "VLDL", "LDLCALC"  Physical Findings: AIMS:  , ,  ,  ,    CIWA:    COWS:      Psychiatric Specialty Exam:  Presentation  General Appearance:  Appropriate for Environment; Bizarre  Eye  Contact: Fleeting  Speech: Blocked  Speech Volume: Decreased    Mood and Affect  Mood: Depressed  Affect: Flat; Restricted   Thought Process  Thought Processes: Disorganized  Descriptions of Associations:Tangential  Orientation:Partial  Thought Content:Illogical; Tangential  Hallucinations:Hallucinations: None  Ideas of Reference:None  Suicidal Thoughts:Suicidal Thoughts: No  Homicidal Thoughts:Homicidal Thoughts: No   Sensorium  Memory: Immediate Fair; Recent Poor; Remote Poor  Judgment: Impaired  Insight: Shallow   Executive Functions  Concentration: Poor  Attention Span: Poor  Recall: Poor  Fund of Knowledge: Fair  Language: Fair  Psychomotor Activity  Psychomotor Activity: Psychomotor Activity: Normal  Musculoskeletal: Strength & Muscle Tone: within normal limits Gait & Station: unsteady Assets  Assets: Manufacturing systems engineer; Physical Health; Resilience    Physical Exam: Physical Exam ROS Blood pressure 122/68, pulse (!) 101, temperature 98.4 F (36.9 C), resp. rate 18, height 5\' 8"  (1.727 m), weight 75.5 kg, SpO2 95%. Body mass index is 25.32 kg/m.  Diagnosis: Principal Problem:   Schizophrenia [facility])   PLAN:Clinical Decision Making: Patient with history of schizoaffective disorder, currently not taking his medications, lacks insight, very paranoid and aggressive towards the staff, disorganized thought process, thinking about the hospital as a jail and demanding for an attorney.  He needs inpatient psychiatric hospitalization for stabilization  Treatment Plan Summary:  Safety and Monitoring:             -- Involuntary admission to inpatient psychiatric unit for safety, stabilization and treatment             -- Daily contact with patient to assess and evaluate symptoms and progress in treatment             -- Patient's case to be discussed in multi-disciplinary team meeting             -- Observation Level: q15  minute checks             -- Vital signs:  q12 hours             -- Precautions: suicide, elopement, and assault   2. Psychiatric Diagnoses and Treatment:               Continue Haldol 10 mg twice daily per chart his next dose of Haldol Decanoate is due 03-17-2023 Add Cogentin 0.5 mg twice daily as needed for EPS -- The risks/benefits/side-effects/alternatives to this medication were discussed in detail with the patient and time was given for questions. The patient consents to medication trial.                -- Metabolic profile and EKG monitoring obtained while on an atypical antipsychotic (BMI: Lipid Panel: HbgA1c: QTc:)              -- Encouraged patient to participate in unit milieu and in scheduled group therapies                            3. Medical Issues Being Addressed:  No urgent medical needs at this time   4. Discharge Planning:              -- Social work and case management to assist with discharge planning and identification of hospital follow-up needs prior to discharge             -- Estimated LOS: 5-7 days             -- Discharge Concerns: Need to establish a safety plan; Medication compliance and effectiveness             -- Discharge Goals: Return home with outpatient referrals follow ups  Timmie Foerster, MD 03/14/2023, 4:50 PM

## 2023-03-14 NOTE — Group Note (Signed)
Date:  03/14/2023 Time:  9:38 PM  Group Topic/Focus:  Wrap-Up Group:   The focus of this group is to help patients review their daily goal of treatment and discuss progress on daily workbooks.    Participation Level:  Did Not Attend  [name] 03/14/2023, 9:38 PM

## 2023-03-15 MED ORDER — DOCUSATE SODIUM 100 MG PO CAPS
100.0000 mg | ORAL_CAPSULE | Freq: Two times a day (BID) | ORAL | Status: DC
Start: 2023-03-15 — End: 2023-03-18
  Administered 2023-03-15 – 2023-03-18 (×7): 100 mg via ORAL
  Filled 2023-03-15 (×7): qty 1

## 2023-03-15 NOTE — Group Note (Signed)
[facility] LCSW Group Therapy Note    Group Date: 03/15/2023 Start Time: 1300 End Time: 1400  Type of Therapy and Topic:  Group Therapy:  Overcoming Obstacles  Participation Level:  [facility] PARTICIPATION LEVEL: Minimal  Mood:  Description of Group:   In this group patients will be encouraged to explore what they see as obstacles to their own wellness and recovery. They will be guided to discuss their thoughts, feelings, and behaviors related to these obstacles. The group will process together ways to cope with barriers, with attention given to specific choices patients can make. Each patient will be challenged to identify changes they are motivated to make in order to overcome their obstacles. This group will be process-oriented, with patients participating in exploration of their own experiences as well as giving and receiving support and challenge from other group members.  Therapeutic Goals: 1. Patient will identify personal and current obstacles as they relate to admission. 2. Patient will identify barriers that currently interfere with their wellness or overcoming obstacles.  3. Patient will identify feelings, thought process and behaviors related to these barriers. 4. Patient will identify two changes they are willing to make to overcome these obstacles:    Summary of Patient Progress   Pt participated during group with minimal insight into topic. Pt did not share out to group members but was respectful to others.    Therapeutic Modalities:   Cognitive Behavioral Therapy Solution Focused Therapy Motivational Interviewing Relapse Prevention Therapy   Elza Rafter, LCSWA

## 2023-03-15 NOTE — Progress Notes (Signed)
   03/15/23 1600  Psych Admission Type (Psych Patients Only)  Admission Status Involuntary  Psychosocial Assessment  Patient Complaints None  Eye Contact Brief  Facial Expression Flat  Affect Flat  Speech Loud  Interaction Minimal  Motor Activity Unsteady  Appearance/Hygiene In scrubs  Behavior Characteristics Cooperative  Mood Pleasant  Thought Process  Coherency Disorganized  Content Preoccupation  Delusions None reported or observed  Perception WDL  Hallucination None reported or observed  Judgment Impaired  Confusion Mild  Danger to Self  Current suicidal ideation? Denies  Danger to Others  Danger to Others None reported or observed   Continues with 1:1 observation for safety. Compliant with all medications.

## 2023-03-15 NOTE — Group Note (Signed)
Recreation Therapy Group Note   Group Topic:Stress Management  Group Date: 03/15/2023 Start Time: 1100 End Time: 1130 Facilitators: Rosina Lowenstein, LRT, CTRS Location:  Dayroom  Group Description: Meditation. LRT and patients discussed what they know about meditation and mindfulness. LRT played a Deep Breathing Meditation exercise script for patients to follow along to. LRT and patients discussed how meditation and deep breathing can be used as a coping skill post--discharge to help manage symptoms of stress.   Goal Area(s) Addressed: Patient will practice using relaxation technique. Patient will identify a new coping skill.  Patient will follow multistep directions to reduce anxiety and stress.   Affect/Mood: N/A   Participation Level: Did not attend    Clinical Observations/Individualized Feedback: Patient did not attend group.   Plan: Continue to engage patient in RT group sessions 2-3x/week.   Rosina Lowenstein, LRT, CTRS 03/15/2023 1:13 PM

## 2023-03-15 NOTE — Group Note (Signed)
Recreation Therapy Group Note   Group Topic:Emotion Expression  Group Date: 03/15/2023 Start Time: 1500 End Time: 1600 Facilitators: Rosina Lowenstein, LRT, CTRS Location:  Dayroom  Group Description: Painting a Diplomatic Services operational officer. Patients and LRT discuss what it means to be "at peace", what it feels like physically and mentally. Pts are given a canvas and watercolor paint to use and encouraged to draw their idea of a peaceful place. Pts and LRT discuss how they use this in their daily life post discharge. Pts are encouraged to take their canvas home with them as a reminder to find their peaceful place whenever they are feeling depressed, anxious, etc.    Goal Area(s) Addressed:  Patient will identify what it means to experience a "peaceful" emotion. Patient will identify a new coping skill.  Patient will express their emotions through art. Patients will increase communication by talking with LRT and peers while in group.   Affect/Mood: N/A   Participation Level: Did not attend    Clinical Observations/Individualized Feedback: Patient did not attend group.   Plan: Continue to engage patient in RT group sessions 2-3x/week.   Rosina Lowenstein, LRT, CTRS 03/15/2023 4:18 PM

## 2023-03-15 NOTE — Progress Notes (Signed)
1:1 NOTE   Safety round complete. Patient located in bedroom. Chest rise and fall noted. Breathing equal. No distress noted at this time. Q15 mins checks will be continued.

## 2023-03-15 NOTE — Plan of Care (Signed)
 Marland Kitchen

## 2023-03-15 NOTE — Progress Notes (Signed)
[facility] MD Progress Note  03/15/2023  [name]  MRN:  [id number] [name] is a 83 y.o. male admitted: Medically for 02/27/2023  9:59 AM for acute kidney injury, altered mental status. He carries the psychiatric diagnoses of schizophrenia and has a past medical history of protein-calorie malnutrition, neurosyphilis, hearing loss, hepatitis C, anemia, BPH, and repeated urinary tract infections in a male. His current presentation of altered mental status is most consistent with schizoaffective disorder with medication non-adherence. He meets criteria for schizophrenia based on historical chart data, current disorganization   Subjective:  Chart reviewed, case discussed in multidisciplinary meeting, patient seen during rounds. 83 year old male with a reported diagnosis of schizophrenia, noncompliance with medications was living with sister, patient is incontinent has challenges with vision and hearing.  Today during assessment patient was oriented to month and year.  Patient reports that" they arrested me and charged me with a primary, I am here to get liberated". Patient is a poor historian secondary to poor insight into his condition.  Patient has one-to-one sitter for safety.  Patient continues to have paranoia about people trying to put him in jail.  He was in need of constant reassurance.  Patient reports that he lives with his sister and would like to go back to stay with his sister.  Patient denies thoughts of harming himself or others.  His insight into his condition is poor.   Sleep: Fair  Appetite:  Fair  Past Psychiatric History: see h&P Family History: History reviewed. No pertinent family history. Social History:  Social History   Substance and Sexual Activity  Alcohol Use No     Social History   Substance and Sexual Activity  Drug Use No    Social History   Socioeconomic History   Marital status: Single    Spouse name: Not on file   Number of children: Not on file    Years of education: Not on file   Highest education level: Not on file  Occupational History   Not on file  Tobacco Use   Smoking status: Former    Current packs/day: 0.00    Average packs/day: 1 pack/day for 20.0 years (20.0 ttl pk-yrs)    Types: Cigarettes    Start date: 06/11/1955    Quit date: 06/11/1975    Years since quitting: 47.7   Smokeless tobacco: Never  Vaping Use   Vaping status: Never Used  Substance and Sexual Activity   Alcohol use: No   Drug use: No   Sexual activity: Not on file  Other Topics Concern   Not on file  Social History Narrative   Not on file   Social Drivers of Health   Financial Resource Strain: Not on file  Food Insecurity: Patient Declined (03/10/2023)   Hunger Vital Sign    Worried About Running Out of Food in the Last Year: Patient declined    Ran Out of Food in the Last Year: Patient declined  Recent Concern: Food Insecurity - Food Insecurity Present (02/28/2023)   Hunger Vital Sign    Worried About Running Out of Food in the Last Year: Often true    Ran Out of Food in the Last Year: Often true  Transportation Needs: Patient Declined (03/10/2023)   PRAPARE - Administrator, Civil Service (Medical): Patient declined    Lack of Transportation (Non-Medical): Patient declined  Physical Activity: Not on file  Stress: Not on file  Social Connections: Patient Declined (03/10/2023)   Social  Connection and Isolation Panel [NHANES]    Frequency of Communication with Friends and Family: Patient declined    Frequency of Social Gatherings with Friends and Family: Patient declined    Attends Religious Services: Patient declined    Active Member of Clubs or Organizations: Patient declined    Attends Banker Meetings: Patient declined    Marital Status: Patient declined   Past Medical History:  Past Medical History:  Diagnosis Date   Acute cystitis without hematuria 02/28/2023   AKI (acute kidney injury) (HCC) 02/27/2023    AMS (altered mental status) 01/06/2023   Anemia 08/31/2012   Benign hypertrophy of prostate    Dysphagia 03/01/2023   Hepatitis C    Hypokalemia 02/06/2013   Schizophrenia (HCC)    Swelling of lower extremity 01/13/2023   Urinary tract infection    Urinary tract infection 06/13/2011   IMO SNOMED Dx Update Oct 2024     UTI (urinary tract infection) 06/13/2011   IMO SNOMED Dx Update Oct 2024      Past Surgical History:  Procedure Laterality Date   KNEE SURGERY     right    Current Medications: Current Facility-Administered Medications  Medication Dose Route Frequency Provider Last Rate Last Admin   benztropine (COGENTIN) tablet 0.5 mg  0.5 mg Oral BID PRN Verner Chol, MD       diltiazem (CARDIZEM CD) 24 hr capsule 180 mg  180 mg Oral Daily Mariel Craft, MD   180 mg at 03/15/23 2956   docusate sodium (COLACE) capsule 100 mg  100 mg Oral BID Lewanda Rife, MD   100 mg at 03/15/23 1133   feeding supplement (ENSURE ENLIVE / ENSURE PLUS) liquid 237 mL  237 mL Oral BID BM Mariel Craft, MD   237 mL at 03/15/23 1135   haloperidol (HALDOL) tablet 10 mg  10 mg Oral BID Mariel Craft, MD   10 mg at 03/15/23 2130   haloperidol (HALDOL) tablet 5 mg  5 mg Oral Q8H PRN Mariel Craft, MD       Or   haloperidol lactate (HALDOL) injection 5 mg  5 mg Intramuscular Q8H PRN Mariel Craft, MD       [START ON 03/17/2023] haloperidol decanoate (HALDOL DECANOATE) 100 MG/ML injection 100 mg  100 mg Intramuscular Once Mariel Craft, MD       multivitamin with minerals tablet 1 tablet  1 tablet Oral Daily Mariel Craft, MD   1 tablet at 03/15/23 8657   Oral care mouth rinse  15 mL Mouth Rinse PRN Mariel Craft, MD       tamsulosin [facility]) capsule 0.4 mg  0.4 mg Oral Daily Mariel Craft, MD   0.4 mg at 03/15/23 8469   thiamine (VITAMIN B1) tablet 100 mg  100 mg Oral Daily Mariel Craft, MD   100 mg at 03/15/23 6295    Lab Results: No results found for this or any  previous visit (from the past 48 hours).  Blood Alcohol level:  Lab Results  Component Value Date   [facility] <10 02/08/2023   ETH <10 01/06/2023      Psychiatric Specialty Exam:  Presentation  General Appearance:  Disheveled  Eye Contact: Fleeting  Speech: Has some latency  Speech Volume: Decreased    Mood and Affect  Mood: Depressed  Affect: Flat; Restricted   Thought Process  Thought Processes: Disorganized  Descriptions of Associations:Tangential  Orientation:Partial  Thought Content:Illogical; Tangential  Hallucinations:  Denies  Ideas of Reference:None  Suicidal Thoughts: Denies  Homicidal Thoughts: Denies  Sensorium  Memory: Immediate Fair; Recent Poor; Remote Poor  Judgment: Impaired  Insight: Shallow   Executive Functions  Concentration: Poor  Attention Span: Poor  Recall: Poor  Fund of Knowledge: Fair  Language: Fair   Psychomotor Activity  Psychomotor Activity: Decreased   Musculoskeletal: Strength & Muscle Tone: within normal limits Gait & Station: unsteady Assets  Assets: Manufacturing systems engineer; Physical Health; Resilience    Physical Exam: Physical Exam HENT:     Head: Normocephalic and atraumatic.     Nose: No congestion.  Cardiovascular:     Rate and Rhythm: Regular rhythm.  Pulmonary:     Effort: Pulmonary effort is normal.  Skin:    General: Skin is warm.  Neurological:     Mental Status: He is alert.    Review of Systems  Constitutional:  Negative for chills and fever.  HENT:  Positive for hearing loss. Negative for sore throat.   Respiratory:  Negative for cough and shortness of breath.   Cardiovascular:  Negative for chest pain and palpitations.  Gastrointestinal:  Negative for heartburn and nausea.  Neurological:  Negative for dizziness.  Psychiatric/Behavioral:  Positive for depression, hallucinations and memory loss.    Blood pressure 125/82, pulse 98, temperature (!) 97.2 F (36.2  C), resp. rate 18, height 5\' 8"  (1.727 m), weight 75.5 kg, SpO2 96%. Body mass index is 25.32 kg/m.  Diagnosis: Principal Problem:   Schizophrenia [facility])   PLAN:Clinical Decision Making: Patient with history of schizoaffective disorder, currently not taking his medications, lacks insight, very paranoid and aggressive towards the staff, disorganized thought process, thinking about the hospital as a jail and demanding for an attorney.  He needs inpatient psychiatric hospitalization for stabilization  Treatment Plan Summary:  Safety and Monitoring:             -- Involuntary admission to inpatient psychiatric unit for safety, stabilization and treatment             -- Daily contact with patient to assess and evaluate symptoms and progress in treatment             -- Patient's case to be discussed in multi-disciplinary team meeting             -- Observation Level: q15 minute checks             -- Vital signs:  q12 hours             -- Precautions: suicide, elopement, and assault   2. Psychiatric Diagnoses and Treatment:               Continue Haldol 10 mg twice daily per chart his next dose of Haldol Decanoate is due 03-17-2023 Add Cogentin 0.5 mg twice daily as needed for EPS               -- Encouraged patient to participate in unit milieu and in scheduled group therapies                            3. Medical Issues Being Addressed:  Patient has been started on Colace for constipation  4. Discharge Planning:              -- Social work and case management to assist with discharge planning and identification of hospital follow-up needs prior to discharge                        --  Discharge Concerns: Need to establish a safety plan; Medication compliance and effectiveness             -- Discharge Goals: Return home with outpatient referrals follow ups  Lewanda Rife, MD

## 2023-03-15 NOTE — Progress Notes (Signed)
   03/15/23 2100  Psych Admission Type (Psych Patients Only)  Admission Status Involuntary  Psychosocial Assessment  Patient Complaints None  Eye Contact Brief  Facial Expression Flat  Affect Flat  Speech Loud;Other (Comment) (hard of hearing)  Interaction Minimal  Motor Activity Slow;Unsteady  Appearance/Hygiene In scrubs  Behavior Characteristics Cooperative  Mood Pleasant  Thought Process  Coherency Disorganized  Content Preoccupation  Delusions None reported or observed  Perception WDL  Hallucination None reported or observed  Judgment Impaired  Confusion Mild  Danger to Self  Current suicidal ideation? Denies  Danger to Others  Danger to Others None reported or observed

## 2023-03-16 DIAGNOSIS — F200 Paranoid schizophrenia: Secondary | ICD-10-CM | POA: Diagnosis not present

## 2023-03-16 NOTE — Plan of Care (Signed)
 [name]

## 2023-03-16 NOTE — Progress Notes (Signed)
   03/16/23 2200  Psych Admission Type (Psych Patients Only)  Admission Status Involuntary  Psychosocial Assessment  Patient Complaints None  Eye Contact Brief  Facial Expression Flat  Affect Flat  Speech Loud;Other (Comment) (hard of hearing)  Interaction Minimal  Motor Activity Unsteady  Appearance/Hygiene In scrubs  Behavior Characteristics Cooperative  Mood Pleasant  Thought Process  Coherency Disorganized  Content Preoccupation  Delusions None reported or observed  Perception WDL  Hallucination None reported or observed  Judgment Impaired  Confusion Mild  Danger to Self  Current suicidal ideation? Denies  Danger to Others  Danger to Others None reported or observed

## 2023-03-16 NOTE — Progress Notes (Signed)
1:1 NOTE    Safety round complete. Patient located in bedroom. Chest rise and fall noted, breathing equal, no distress at this time. Q15 mins checks will be continued.

## 2023-03-16 NOTE — Progress Notes (Signed)
Physical Therapy Group Note   Group Topic:  Home Safety Modifications for Fall Prevention, Fall Recovery Technique Group Date: 03/16/2023 Group Time (start and end): [id number] Facilitators:  Cephus Slater, PT; Elly Modena, PT   Group Description: Group discussed various home safety modifications to optimize safety, minimize fall risk in home environment.  Brainstormed and discussed opportunities throughout specific rooms/spaces of the home (entryway, kitchen, bathroom, bedroom and main living spaces), encouraging active participation with ideas and suggestions throughout session.  Provided handout with home safety checklist ([employer] on Aging) for reference outside of group time.  Encouraged each participant to voice a specific modification that he/she could implement at discharge to improve safety of his/her own home environment.   Additionally, reviewed strategy/technique and safety considerations for fall recovery.  Reviewed importance of calling emergency services as necessary and waiting for assistance to arrive if injury suspected.  If injury ruled out, did review fall recovery technique, transitioning from supine --> quadruped --> tall kneeling --> standing while using solid support surface for stabilization.  Therapist demonstrated technique, encouraging group participants to problem-solve and sequence correct technique.   Therapeutic Goal(s): Identify and discuss home safety modifications for primary living spaces of home environment. Identify and discuss fall recovery techniques.     Individual Participation:  Patient receptive to information, but minimally participatory in conversational parts of group discussion.  Direct engagement with mod/max cuing required to facilitate active conversation and participation with group.   Verbalized understanding of fall recovery strategies (declined performance of task) with max assist to problem-solve and sequence.     Patient  identifies "praying" as his primary take-away and home safety modification for use at discharge.     Participation Level and Quality: Limited engagement, participation; receptive to info, but minimally conversational    Behavior: Appropriate    Speech/Thought Process: Fair    Affect/Mood: Generally flat, limited engagement    Insight: Impaired    Judgement: Impaired    Modes of Intervention: Verbal discussion, demonstration, handout provided        Plan: Continue to engage patient in PT/OT groups 1-2x/week.  [name], PT, DPT, NCS 03/16/23, 4:39 PM [phone]

## 2023-03-16 NOTE — Progress Notes (Signed)
   03/16/23 1200  Psych Admission Type (Psych Patients Only)  Admission Status Involuntary  Psychosocial Assessment  Patient Complaints None  Eye Contact Brief  Facial Expression Flat  Affect Flat  Speech Loud  Interaction Minimal  Motor Activity Unsteady  Appearance/Hygiene In scrubs  Behavior Characteristics Cooperative  Mood Pleasant  Thought Process  Coherency Disorganized  Content Preoccupation  Delusions None reported or observed  Perception WDL  Hallucination None reported or observed  Judgment Impaired  Confusion Mild  Danger to Self  Current suicidal ideation? Denies  Danger to Others  Danger to Others None reported or observed   Continues with 1:1 monitoring.

## 2023-03-16 NOTE — Group Note (Signed)
Recreation Therapy Group Note   Group Topic:Other  Group Date: 03/16/2023 Start Time: 1400 End Time: 1445 Facilitators: Rosina Lowenstein, LRT, CTRS Location:  Dayroom  Activity Description/Intervention: Therapeutic Drumming. Patients with peers and staff were given the opportunity to engage in a leader facilitated [facility] Empowerment Drumming Circle with staff from the [employer], in partnership with [employer]. Teaching laboratory technician and trained [employer], Theodoro Doing leading with LRT observing and documenting intervention and pt response. This evidenced-based practice targets 7 areas of health and wellbeing in the human experience including: stress-reduction, exercise, self-expression, camaraderie/support, nurturing, spirituality, and music-making (leisure).    Goal Area(s) Addresses:  Patient will engage in pro-social way in music group.  Patient will follow directions of drum leader on the first prompt. Patient will demonstrate no behavioral issues during group.  Patient will identify if a reduction in stress level occurs as a result of participation in therapeutic drum circle.     Affect/Mood: N/A   Participation Level: Did not attend    Clinical Observations/Individualized Feedback: Patient did not attend group.   Plan: Continue to engage patient in RT group sessions 2-3x/week.   Rosina Lowenstein, LRT, CTRS 03/16/2023 2:49 PM

## 2023-03-16 NOTE — BH IP Treatment Plan (Signed)
Interdisciplinary Treatment and Diagnostic Plan Update  03/16/2023 Time of Session: 11:00 AM  [name] MRN: [id number]  Principal Diagnosis: Schizophrenia [facility])  Secondary Diagnoses: Principal Problem:   Schizophrenia (HCC)   Current Medications:  Current Facility-Administered Medications  Medication Dose Route Frequency Provider Last Rate Last Admin   benztropine (COGENTIN) tablet 0.5 mg  0.5 mg Oral BID PRN Verner Chol, MD       diltiazem (CARDIZEM CD) 24 hr capsule 180 mg  180 mg Oral Daily Mariel Craft, MD   180 mg at 03/16/23 0835   docusate sodium (COLACE) capsule 100 mg  100 mg Oral BID Lewanda Rife, MD   100 mg at 03/16/23 0835   feeding supplement (ENSURE ENLIVE / ENSURE PLUS) liquid 237 mL  237 mL Oral BID BM Mariel Craft, MD   237 mL at 03/16/23 0948   haloperidol (HALDOL) tablet 10 mg  10 mg Oral BID Mariel Craft, MD   10 mg at 03/16/23 4098   haloperidol (HALDOL) tablet 5 mg  5 mg Oral Q8H PRN Mariel Craft, MD       Or   haloperidol lactate (HALDOL) injection 5 mg  5 mg Intramuscular Q8H PRN Mariel Craft, MD       [START ON 03/17/2023] haloperidol decanoate (HALDOL DECANOATE) 100 MG/ML injection 100 mg  100 mg Intramuscular Once Mariel Craft, MD       multivitamin with minerals tablet 1 tablet  1 tablet Oral Daily Mariel Craft, MD   1 tablet at 03/16/23 1191   Oral care mouth rinse  15 mL Mouth Rinse PRN Mariel Craft, MD       tamsulosin [facility]) capsule 0.4 mg  0.4 mg Oral Daily Mariel Craft, MD   0.4 mg at 03/16/23 4782   thiamine (VITAMIN B1) tablet 100 mg  100 mg Oral Daily Mariel Craft, MD   100 mg at 03/16/23 9562   PTA Medications: Medications Prior to Admission  Medication Sig Dispense Refill Last Dose/Taking   acetaminophen (TYLENOL) 325 MG tablet Take 2 tablets (650 mg total) by mouth every 6 (six) hours as needed for mild pain (pain score 1-3) (or Fever >/= 101).      acetaminophen (TYLENOL) 650 MG  suppository Place 1 suppository (650 mg total) rectally every 6 (six) hours as needed for mild pain (pain score 1-3) (or Fever >/= 101).      diltiazem (CARDIZEM CD) 180 MG 24 hr capsule Take 1 capsule (180 mg total) by mouth daily.      feeding supplement (ENSURE ENLIVE / ENSURE PLUS) LIQD Take 237 mLs by mouth 2 (two) times daily between meals.      haloperidol (HALDOL) 10 MG tablet Take 1 tablet (10 mg total) by mouth 2 (two) times daily.      [START ON 03/17/2023] haloperidol decanoate (HALDOL DECANOATE) 100 MG/ML injection Inject 1 mL (100 mg total) into the muscle once a week for 7 days. Pt received 1st IM injection on 2/13, needs to receive 2nd IM injection      Mouthwashes (MOUTH RINSE) LIQD solution 15 mLs by Mouth Rinse route as needed (for oral care).      Multiple Vitamin (MULTIVITAMIN WITH MINERALS) TABS tablet Take 1 tablet by mouth daily.      tamsulosin (FLOMAX) 0.4 MG CAPS capsule Take 1 capsule (0.4 mg total) by mouth daily. 30 capsule 4    thiamine (VITAMIN B-1) 100 MG tablet Take 1  tablet (100 mg total) by mouth daily.       Patient Stressors: Financial difficulties   Health problems    Patient Strengths: Motivation for treatment/growth  Religious Affiliation   Treatment Modalities: Medication Management, Group therapy, Case management,  1 to 1 session with clinician, Psychoeducation, Recreational therapy.   Physician Treatment Plan for Primary Diagnosis: Schizophrenia (HCC) Long Term Goal(s): Improvement in symptoms so as ready for discharge   Short Term Goals: Ability to identify changes in lifestyle to reduce recurrence of condition will improve Ability to verbalize feelings will improve Ability to disclose and discuss suicidal ideas Ability to demonstrate self-control will improve Ability to identify and develop effective coping behaviors will improve Ability to maintain clinical measurements within normal limits will improve Compliance with prescribed medications  will improve Ability to identify triggers associated with substance abuse/mental health issues will improve  Medication Management: Evaluate patient's response, side effects, and tolerance of medication regimen.  Therapeutic Interventions: 1 to 1 sessions, Unit Group sessions and Medication administration.  Evaluation of Outcomes: Progressing  Physician Treatment Plan for Secondary Diagnosis: Principal Problem:   Schizophrenia (HCC)  Long Term Goal(s): Improvement in symptoms so as ready for discharge   Short Term Goals: Ability to identify changes in lifestyle to reduce recurrence of condition will improve Ability to verbalize feelings will improve Ability to disclose and discuss suicidal ideas Ability to demonstrate self-control will improve Ability to identify and develop effective coping behaviors will improve Ability to maintain clinical measurements within normal limits will improve Compliance with prescribed medications will improve Ability to identify triggers associated with substance abuse/mental health issues will improve     Medication Management: Evaluate patient's response, side effects, and tolerance of medication regimen.  Therapeutic Interventions: 1 to 1 sessions, Unit Group sessions and Medication administration.  Evaluation of Outcomes: Progressing   RN Treatment Plan for Primary Diagnosis: Schizophrenia (HCC) Long Term Goal(s): Knowledge of disease and therapeutic regimen to maintain health will improve  Short Term Goals: Ability to remain free from injury will improve, Ability to verbalize frustration and anger appropriately will improve, Ability to demonstrate self-control, Ability to participate in decision making will improve, Ability to verbalize feelings will improve, Ability to disclose and discuss suicidal ideas, Ability to identify and develop effective coping behaviors will improve, and Compliance with prescribed medications will improve  Medication  Management: RN will administer medications as ordered by provider, will assess and evaluate patient's response and provide education to patient for prescribed medication. RN will report any adverse and/or side effects to prescribing provider.  Therapeutic Interventions: 1 on 1 counseling sessions, Psychoeducation, Medication administration, Evaluate responses to treatment, Monitor vital signs and CBGs as ordered, Perform/monitor CIWA, COWS, AIMS and Fall Risk screenings as ordered, Perform wound care treatments as ordered.  Evaluation of Outcomes: Progressing   LCSW Treatment Plan for Primary Diagnosis: Schizophrenia (HCC) Long Term Goal(s): Safe transition to appropriate next level of care at discharge, Engage patient in therapeutic group addressing interpersonal concerns.  Short Term Goals: Engage patient in aftercare planning with referrals and resources, Increase social support, Increase ability to appropriately verbalize feelings, Increase emotional regulation, Facilitate acceptance of mental health diagnosis and concerns, Facilitate patient progression through stages of change regarding substance use diagnoses and concerns, Identify triggers associated with mental health/substance abuse issues, and Increase skills for wellness and recovery  Therapeutic Interventions: Assess for all discharge needs, 1 to 1 time with Social worker, Explore available resources and support systems, Assess for adequacy in community support  network, Educate family and significant other(s) on suicide prevention, Complete Psychosocial Assessment, Interpersonal group therapy.  Evaluation of Outcomes: Progressing   Progress in Treatment: Attending groups: Yes. and No. Participating in groups: Yes. and No. Taking medication as prescribed: Yes. Toleration medication: Yes. Family/Significant other contact made: Yes, individual(s) contacted:  [name], sister, [phone] Patient understands diagnosis:  Yes. Discussing patient identified problems/goals with staff: Yes. Medical problems stabilized or resolved: Yes. Denies suicidal/homicidal ideation: Yes. Issues/concerns per patient self-inventory: No. Other: None   New problem(s) identified: No, Describe:  None identified Update 03/16/23: No changes at this time    New Short Term/Long Term Goal(s):  elimination of symptoms of psychosis, medication management for mood stabilization; elimination of SI thoughts; development of comprehensive mental wellness plan.  Update 03/16/23: No changes at this time   Patient Goals:  " I want to go"  Update 03/16/23: No changes at this time   Discharge Plan or Barriers: CSW to assist with discharge planning  Update 03/16/23: No changes at this time   Reason for Continuation of Hospitalization: Medication stabilization   Estimated Length of Stay: 1 to 7 days  Update 03/16/23:TBD  Last 3 [place] Suicide Severity Risk Score: Flowsheet Row Admission (Current) from 03/10/2023 in [facility] to Hosp-Admission (Discharged) from 02/27/2023 in [place] [facility] ED from 02/26/2023 in [facility]  C-SSRS RISK CATEGORY No Risk No Risk No Risk       Last [facility] 2/9 Scores:     No data to display          Scribe for Treatment Team: Elza Rafter, Theresia Majors 03/16/2023 2:48 PM

## 2023-03-16 NOTE — Progress Notes (Signed)
   03/16/23 0617  15 Minute Checks  Location Bedroom  Visual Appearance Calm  Behavior Sleeping  Sleep (Behavioral Health Patients Only)  Calculate sleep? (Click Yes once per 24 hr at 0600 safety check) Yes  Documented sleep last 24 hours 10.25

## 2023-03-16 NOTE — Group Note (Signed)
Recreation Therapy Group Note   Group Topic:Relaxation  Group Date: 03/16/2023 Start Time: 1100 End Time: 1135 Facilitators: Rosina Lowenstein, LRT, CTRS Location:  Dayroom  Group Description: PMR (Progressive Muscle Relaxation). LRT educates patients on what PMR is and the benefits that come from it. Patients are asked to sit with their feet flat on the floor while sitting up and all the way back in their chair, if possible. LRT and pts follow a prompt through a speaker that requires you to tense and release different muscles in their body and focus on their breathing. During session, lights are off and soft music is being played.  Goal Area(s) Addressed:  Patients will be able to describe progressive muscle relaxation.  Patient will practice using relaxation technique. Patient will identify a new coping skill.  Patient will follow multistep directions to reduce anxiety and stress.   Affect/Mood: N/A   Participation Level: Non-verbal    Clinical Observations/Individualized Feedback: Ernestine was present in the dayroom at the time of group; however, pt did not complete any of the exercises. Pt did not interact with LRT or peer while in group.   Plan: Continue to engage patient in RT group sessions 2-3x/week.   Rosina Lowenstein, LRT, CTRS 03/16/2023 12:23 PM

## 2023-03-16 NOTE — Progress Notes (Signed)
[facility] MD Progress Note  03/16/2023  [name]  MRN:  [id number] [name] is a 83 y.o. male admitted: Medically for 02/27/2023  9:59 AM for acute kidney injury, altered mental status. He carries the psychiatric diagnoses of schizophrenia and has a past medical history of protein-calorie malnutrition, neurosyphilis, hearing loss, hepatitis C, anemia, BPH, and repeated urinary tract infections in a male. His current presentation of altered mental status is most consistent with schizoaffective disorder with medication non-adherence. He meets criteria for schizophrenia based on historical chart data, current disorganization   Subjective:  Chart reviewed, case discussed in multidisciplinary meeting, patient seen during rounds. 83 year old male with a reported diagnosis of schizophrenia, noncompliance with medications was living with sister, patient is incontinent has challenges with vision and hearing.   Today during assessment patient was oriented to month and year.  Patient reports "fine" mood. He is focussed on going home. Patient is a poor historian secondary to poor insight into his condition.  Patient has one-to-one sitter for safety.  Patient continues to have paranoia about people trying to put him in jail.  He was in need of constant reassurance.  Sw consulted to get collateral from sister. Patient denies thoughts of harming himself or others.  His insight into his condition is poor.   Sleep: Fair  Appetite:  Fair  Past Psychiatric History: see h&P Family History: History reviewed. No pertinent family history. Social History:  Social History   Substance and Sexual Activity  Alcohol Use No     Social History   Substance and Sexual Activity  Drug Use No    Social History   Socioeconomic History   Marital status: Single    Spouse name: Not on file   Number of children: Not on file   Years of education: Not on file   Highest education level: Not on file  Occupational History    Not on file  Tobacco Use   Smoking status: Former    Current packs/day: 0.00    Average packs/day: 1 pack/day for 20.0 years (20.0 ttl pk-yrs)    Types: Cigarettes    Start date: 06/11/1955    Quit date: 06/11/1975    Years since quitting: 47.7   Smokeless tobacco: Never  Vaping Use   Vaping status: Never Used  Substance and Sexual Activity   Alcohol use: No   Drug use: No   Sexual activity: Not on file  Other Topics Concern   Not on file  Social History Narrative   Not on file   Social Drivers of Health   Financial Resource Strain: Not on file  Food Insecurity: Patient Declined (03/10/2023)   Hunger Vital Sign    Worried About Running Out of Food in the Last Year: Patient declined    Ran Out of Food in the Last Year: Patient declined  Recent Concern: Food Insecurity - Food Insecurity Present (02/28/2023)   Hunger Vital Sign    Worried About Running Out of Food in the Last Year: Often true    Ran Out of Food in the Last Year: Often true  Transportation Needs: Patient Declined (03/10/2023)   PRAPARE - Administrator, Civil Service (Medical): Patient declined    Lack of Transportation (Non-Medical): Patient declined  Physical Activity: Not on file  Stress: Not on file  Social Connections: Patient Declined (03/10/2023)   Social Connection and Isolation Panel [NHANES]    Frequency of Communication with Friends and Family: Patient declined  Frequency of Social Gatherings with Friends and Family: Patient declined    Attends Religious Services: Patient declined    Active Member of Clubs or Organizations: Patient declined    Attends Banker Meetings: Patient declined    Marital Status: Patient declined   Past Medical History:  Past Medical History:  Diagnosis Date   Acute cystitis without hematuria 02/28/2023   AKI (acute kidney injury) (HCC) 02/27/2023   AMS (altered mental status) 01/06/2023   Anemia 08/31/2012   Benign hypertrophy of prostate     Dysphagia 03/01/2023   Hepatitis C    Hypokalemia 02/06/2013   Schizophrenia (HCC)    Swelling of lower extremity 01/13/2023   Urinary tract infection    Urinary tract infection 06/13/2011   IMO SNOMED Dx Update Oct 2024     UTI (urinary tract infection) 06/13/2011   IMO SNOMED Dx Update Oct 2024      Past Surgical History:  Procedure Laterality Date   KNEE SURGERY     right    Current Medications: Current Facility-Administered Medications  Medication Dose Route Frequency Provider Last Rate Last Admin   benztropine (COGENTIN) tablet 0.5 mg  0.5 mg Oral BID PRN Verner Chol, MD       diltiazem (CARDIZEM CD) 24 hr capsule 180 mg  180 mg Oral Daily Mariel Craft, MD   180 mg at 03/16/23 0835   docusate sodium (COLACE) capsule 100 mg  100 mg Oral BID Lewanda Rife, MD   100 mg at 03/16/23 0835   feeding supplement (ENSURE ENLIVE / ENSURE PLUS) liquid 237 mL  237 mL Oral BID BM Mariel Craft, MD   237 mL at 03/16/23 1500   haloperidol (HALDOL) tablet 10 mg  10 mg Oral BID Mariel Craft, MD   10 mg at 03/16/23 1610   haloperidol (HALDOL) tablet 5 mg  5 mg Oral Q8H PRN Mariel Craft, MD       Or   haloperidol lactate (HALDOL) injection 5 mg  5 mg Intramuscular Q8H PRN Mariel Craft, MD       [START ON 03/17/2023] haloperidol decanoate (HALDOL DECANOATE) 100 MG/ML injection 100 mg  100 mg Intramuscular Once Mariel Craft, MD       multivitamin with minerals tablet 1 tablet  1 tablet Oral Daily Mariel Craft, MD   1 tablet at 03/16/23 9604   Oral care mouth rinse  15 mL Mouth Rinse PRN Mariel Craft, MD       tamsulosin [facility]) capsule 0.4 mg  0.4 mg Oral Daily Mariel Craft, MD   0.4 mg at 03/16/23 5409   thiamine (VITAMIN B1) tablet 100 mg  100 mg Oral Daily Mariel Craft, MD   100 mg at 03/16/23 8119    Lab Results: No results found for this or any previous visit (from the past 48 hours).  Blood Alcohol level:  Lab Results  Component Value Date    [facility] <10 02/08/2023   ETH <10 01/06/2023      Psychiatric Specialty Exam:  Presentation  General Appearance:  Disheveled  Eye Contact: Fleeting  Speech: Has some latency  Speech Volume: Normal    Mood and Affect  Mood: "Fine"  Affect: Flat; Restricted   Thought Process  Thought Processes: Disorganized  Descriptions of Associations:Tangential  Orientation:Partial  Thought Content:Illogical; Tangential  Hallucinations: Denies  Ideas of Reference:None  Suicidal Thoughts: Denies  Homicidal Thoughts: Denies  Sensorium  Memory: Immediate Fair; Recent  Poor; Remote Poor  Judgment: Impaired  Insight: Shallow   Executive Functions  Concentration: Poor  Attention Span: Poor  Recall: Poor  Fund of Knowledge: Fair  Language: Fair   Psychomotor Activity  Psychomotor Activity: Decreased   Musculoskeletal: Strength & Muscle Tone: within normal limits Gait & Station: unsteady Assets  Assets: Manufacturing systems engineer; Physical Health; Resilience    Physical Exam: Physical Exam HENT:     Head: Normocephalic and atraumatic.     Nose: No congestion.  Cardiovascular:     Rate and Rhythm: Regular rhythm.  Pulmonary:     Effort: Pulmonary effort is normal.  Skin:    General: Skin is warm.  Neurological:     Mental Status: He is alert.    Review of Systems  Constitutional:  Negative for chills and fever.  HENT:  Positive for hearing loss. Negative for sore throat.   Respiratory:  Negative for cough and shortness of breath.   Cardiovascular:  Negative for chest pain and palpitations.  Gastrointestinal:  Negative for heartburn and nausea.  Neurological:  Negative for dizziness.  Psychiatric/Behavioral:  Positive for depression, hallucinations and memory loss.    Blood pressure 118/69, pulse 80, temperature 98.1 F (36.7 C), resp. rate 14, height 5\' 8"  (1.727 m), weight 75.5 kg, SpO2 98%. Body mass index is 25.32 kg/m.  Diagnosis:  Principal Problem:   Schizophrenia [facility])   PLAN:Clinical Decision Making: Patient with history of schizoaffective disorder, currently not taking his medications, lacks insight, very paranoid and aggressive towards the staff, disorganized thought process, thinking about the hospital as a jail and demanding for an attorney.  He needs inpatient psychiatric hospitalization for stabilization  Treatment Plan Summary:  Safety and Monitoring:             -- Involuntary admission to inpatient psychiatric unit for safety, stabilization and treatment             -- Daily contact with patient to assess and evaluate symptoms and progress in treatment             -- Patient's case to be discussed in multi-disciplinary team meeting             -- Observation Level: q15 minute checks             -- Vital signs:  q12 hours             -- Precautions: suicide, elopement, and assault   2. Psychiatric Diagnoses and Treatment:               Continue Haldol 10 mg twice daily per chart his next dose of Haldol Decanoate is due 03-17-2023 Add Cogentin 0.5 mg twice daily as needed for EPS               -- Encouraged patient to participate in unit milieu and in scheduled group therapies                            3. Medical Issues Being Addressed:  Patient has been started on Colace for constipation  4. Discharge Planning:              -- Social work and case management to assist with discharge planning and identification of hospital follow-up needs prior to discharge                        -- Discharge Concerns: Need to  establish a safety plan; Medication compliance and effectiveness              Lewanda Rife, MD

## 2023-03-17 DIAGNOSIS — F200 Paranoid schizophrenia: Secondary | ICD-10-CM | POA: Diagnosis not present

## 2023-03-17 MED ORDER — MAGNESIUM HYDROXIDE 400 MG/5ML PO SUSP: 15.0000 mL | Freq: Every day | ORAL | Status: DC | PRN

## 2023-03-17 NOTE — Progress Notes (Signed)
   03/17/23 1415  Spiritual Encounters  Type of Visit Initial  Care provided to: Patient  Conversation partners present during encounter Nurse  Referral source Nurse (RN/NT/LPN)  Reason for visit Routine spiritual support  OnCall Visit No   Chaplain learned that patient was interested in receiving a Bible so Chaplain provided the patient with applicable spiritual resources.    Rev. Rana M. Earlene Plater, MDiv Chaplain Resident [facility]

## 2023-03-17 NOTE — Group Note (Unsigned)
Date:  03/17/2023 Time:  4:54 AM  Group Topic/Focus:  Building Self Esteem:   The Focus of this group is helping patients become aware of the effects of self-esteem on their lives, the things they and others do that enhance or undermine their self-esteem, seeing the relationship between their level of self-esteem and the choices they make and learning ways to enhance self-esteem.     Participation Level:  {BHH PARTICIPATION WJXBJ:47829}  Participation Quality:  {BHH PARTICIPATION QUALITY:22265}  Affect:  {BHH AFFECT:22266}  Cognitive:  {BHH COGNITIVE:22267}  Insight: {BHH Insight2:20797}  Engagement in Group:  {BHH ENGAGEMENT IN FAOZH:08657}  Modes of Intervention:  {BHH MODES OF INTERVENTION:22269}  Additional Comments:  ***  Mylisa Brunson 03/17/2023, 4:54 AM

## 2023-03-17 NOTE — Progress Notes (Signed)
 1:1 sitter for paitent safety.    Patient pleasant with this writer, but can be irritable at times.  Speaks loudly and is hard of hearing - difficult to assess.  Denies SI/HI and AVH.  Denies pain.  Patient is confused and was getting out of bed because he had to go to school.  Verbally re-directed.    Compliant with scheduled medications.  15 min checks in place for safety.  Patient is present in the milieu this afternoon.  Minimal interaction with peers.

## 2023-03-17 NOTE — Progress Notes (Signed)
[facility] MD Progress Note  03/17/2023  [name]  MRN:  [id number] [name] is a 83 y.o. male admitted: Medically for 02/27/2023  9:59 AM for acute kidney injury, altered mental status. He carries the psychiatric diagnoses of schizophrenia and has a past medical history of protein-calorie malnutrition, neurosyphilis, hearing loss, hepatitis C, anemia, BPH, and repeated urinary tract infections in a male. His current presentation of altered mental status is most consistent with schizoaffective disorder with medication non-adherence. He meets criteria for schizophrenia based on historical chart data, current disorganization   Subjective:  Chart reviewed, case discussed in multidisciplinary meeting, patient seen during rounds. 83 year old male with a reported diagnosis of schizophrenia, noncompliance with medications was living with sister, patient is incontinent has challenges with vision and hearing.   Today during assessment patient was oriented to month and year.  Patient reports he would like to go home.  Patient was also hurts screaming few times in his room.  He continues to complain of constipation.  Patient has been prescribed milk of magnesia.  Patient is a poor historian secondary to poor insight into his condition.  Patient has one-to-one sitter for safety.  Patient continues to have paranoia about people trying to put him in jail.  He was in need of constant reassurance.  Sw consulted to get collateral from sister. Patient denies thoughts of harming himself or others.  His insight into his condition is poor.  I have discussed patient's medications with Dr. Irwin Brakeman. Apparently patient received Haldol 100 mg IM on 03/10/2023, another dose was ordered for 03/17/2023.   Dr. Irwin Brakeman, and I agreed that the patient probably does not need 200 mg of Haldol Decanoate IM given within a week because of his age. Also added efficacy and benefits of Haldol decanoate at higher than 100 mg is  questionable  Sleep: Fair  Appetite:  Fair  Past Psychiatric History: see h&P Family History: History reviewed. No pertinent family history. Social History:  Social History   Substance and Sexual Activity  Alcohol Use No     Social History   Substance and Sexual Activity  Drug Use No    Social History   Socioeconomic History   Marital status: Single    Spouse name: Not on file   Number of children: Not on file   Years of education: Not on file   Highest education level: Not on file  Occupational History   Not on file  Tobacco Use   Smoking status: Former    Current packs/day: 0.00    Average packs/day: 1 pack/day for 20.0 years (20.0 ttl pk-yrs)    Types: Cigarettes    Start date: 06/11/1955    Quit date: 06/11/1975    Years since quitting: 47.7   Smokeless tobacco: Never  Vaping Use   Vaping status: Never Used  Substance and Sexual Activity   Alcohol use: No   Drug use: No   Sexual activity: Not on file  Other Topics Concern   Not on file  Social History Narrative   Not on file   Social Drivers of Health   Financial Resource Strain: Not on file  Food Insecurity: Patient Declined (03/10/2023)   Hunger Vital Sign    Worried About Running Out of Food in the Last Year: Patient declined    Ran Out of Food in the Last Year: Patient declined  Recent Concern: Food Insecurity - Food Insecurity Present (02/28/2023)   Hunger Vital Sign    Worried About Running  Out of Food in the Last Year: Often true    Ran Out of Food in the Last Year: Often true  Transportation Needs: Patient Declined (03/10/2023)   PRAPARE - Administrator, Civil Service (Medical): Patient declined    Lack of Transportation (Non-Medical): Patient declined  Physical Activity: Not on file  Stress: Not on file  Social Connections: Patient Declined (03/10/2023)   Social Connection and Isolation Panel [NHANES]    Frequency of Communication with Friends and Family: Patient declined     Frequency of Social Gatherings with Friends and Family: Patient declined    Attends Religious Services: Patient declined    Database administrator or Organizations: Patient declined    Attends Banker Meetings: Patient declined    Marital Status: Patient declined   Past Medical History:  Past Medical History:  Diagnosis Date   Acute cystitis without hematuria 02/28/2023   AKI (acute kidney injury) (HCC) 02/27/2023   AMS (altered mental status) 01/06/2023   Anemia 08/31/2012   Benign hypertrophy of prostate    Dysphagia 03/01/2023   Hepatitis C    Hypokalemia 02/06/2013   Schizophrenia (HCC)    Swelling of lower extremity 01/13/2023   Urinary tract infection    Urinary tract infection 06/13/2011   IMO SNOMED Dx Update Oct 2024     UTI (urinary tract infection) 06/13/2011   IMO SNOMED Dx Update Oct 2024      Past Surgical History:  Procedure Laterality Date   KNEE SURGERY     right    Current Medications: Current Facility-Administered Medications  Medication Dose Route Frequency Provider Last Rate Last Admin   benztropine (COGENTIN) tablet 0.5 mg  0.5 mg Oral BID PRN Verner Chol, MD       diltiazem (CARDIZEM CD) 24 hr capsule 180 mg  180 mg Oral Daily Mariel Craft, MD   180 mg at 03/17/23 1610   docusate sodium (COLACE) capsule 100 mg  100 mg Oral BID Lewanda Rife, MD   100 mg at 03/17/23 0917   feeding supplement (ENSURE ENLIVE / ENSURE PLUS) liquid 237 mL  237 mL Oral BID BM Mariel Craft, MD   237 mL at 03/17/23 0919   haloperidol (HALDOL) tablet 10 mg  10 mg Oral BID Mariel Craft, MD   10 mg at 03/17/23 9604   haloperidol (HALDOL) tablet 5 mg  5 mg Oral Q8H PRN Mariel Craft, MD       Or   haloperidol lactate (HALDOL) injection 5 mg  5 mg Intramuscular Q8H PRN Mariel Craft, MD       magnesium hydroxide (MILK OF MAGNESIA) suspension 15 mL  15 mL Oral Daily PRN Lewanda Rife, MD       multivitamin with minerals tablet 1 tablet  1  tablet Oral Daily Mariel Craft, MD   1 tablet at 03/17/23 5409   Oral care mouth rinse  15 mL Mouth Rinse PRN Mariel Craft, MD       tamsulosin [facility]) capsule 0.4 mg  0.4 mg Oral Daily Mariel Craft, MD   0.4 mg at 03/17/23 8119   thiamine (VITAMIN B1) tablet 100 mg  100 mg Oral Daily Mariel Craft, MD   100 mg at 03/17/23 1478    Lab Results: No results found for this or any previous visit (from the past 48 hours).  Blood Alcohol level:  Lab Results  Component Value Date   ETH <10  02/08/2023   ETH <10 01/06/2023      Psychiatric Specialty Exam:  Presentation  General Appearance:  Disheveled  Eye Contact: Fleeting  Speech: Has some latency  Speech Volume: Normal    Mood and Affect  Mood: "Fine"  Affect: Flat; Restricted   Thought Process  Thought Processes: Disorganized  Descriptions of Associations:Tangential  Orientation:Partial  Thought Content:Illogical; Tangential  Hallucinations: Denies  Ideas of Reference:None  Suicidal Thoughts: Denies  Homicidal Thoughts: Denies  Sensorium  Memory: Immediate Fair; Recent Poor; Remote Poor  Judgment: Impaired  Insight: Shallow   Executive Functions  Concentration: Poor  Attention Span: Poor  Recall: Poor  Fund of Knowledge: Fair  Language: Fair   Psychomotor Activity  Psychomotor Activity: Decreased   Musculoskeletal: Strength & Muscle Tone: within normal limits Gait & Station: unsteady Assets  Assets: Manufacturing systems engineer; Physical Health; Resilience    Physical Exam: Physical Exam HENT:     Head: Normocephalic and atraumatic.     Nose: No congestion.  Cardiovascular:     Rate and Rhythm: Regular rhythm.  Pulmonary:     Effort: Pulmonary effort is normal.  Skin:    General: Skin is warm.  Neurological:     Mental Status: He is alert.    Review of Systems  Constitutional:  Negative for chills and fever.  HENT:  Positive for hearing loss.  Negative for sore throat.   Respiratory:  Negative for cough and shortness of breath.   Cardiovascular:  Negative for chest pain and palpitations.  Gastrointestinal:  Negative for heartburn and nausea.  Neurological:  Negative for dizziness.  Psychiatric/Behavioral:  Positive for depression, hallucinations and memory loss.    Blood pressure (!) 145/90, pulse 84, temperature 98.1 F (36.7 C), resp. rate 18, height 5\' 8"  (1.727 m), weight 75.5 kg, SpO2 99%. Body mass index is 25.32 kg/m.  Diagnosis: Principal Problem:   Schizophrenia [facility])   PLAN:Clinical Decision Making: Patient with history of schizoaffective disorder, currently not taking his medications, lacks insight, very paranoid and aggressive towards the staff, disorganized thought process, thinking about the hospital as a jail and demanding for an attorney.  He needs inpatient psychiatric hospitalization for stabilization  Treatment Plan Summary:  Safety and Monitoring:             -- Involuntary admission to inpatient psychiatric unit for safety, stabilization and treatment             -- Daily contact with patient to assess and evaluate symptoms and progress in treatment             -- Patient's case to be discussed in multi-disciplinary team meeting             -- Observation Level: q15 minute checks             -- Vital signs:  q12 hours             -- Precautions: suicide, elopement, and assault   2. Psychiatric Diagnoses and Treatment:               Continue Haldol 10 mg twice daily per chart his next dose of Haldol Decanoate is due 03-17-2023 Add Cogentin 0.5 mg twice daily as needed for EPS               -- Encouraged patient to participate in unit milieu and in scheduled group therapies  3. Medical Issues Being Addressed:  Patient has been started on Colace for constipation  4. Discharge Planning:              -- Social work and case management to assist with discharge planning and  identification of hospital follow-up needs prior to discharge                        -- Discharge Concerns: Need to establish a safety plan; Medication compliance and effectiveness              Lewanda Rife, MD

## 2023-03-17 NOTE — Group Note (Signed)
Date:  03/17/2023 Time:  9:56 PM  Group Topic/Focus:  Overcoming Stress:   The focus of this group is to define stress and help patients assess their triggers.    Participation Level:  Minimal  Participation Quality:  Inattentive  Affect:  Flat  Cognitive:  Lacking  Insight: None  Engagement in Group:  None  Modes of Intervention:  Orientation  Additional Comments:    [name] 03/17/2023, 9:56 PM

## 2023-03-17 NOTE — BHH Counselor (Signed)
CSW referred pt to [facility] Team.   [name], MSW, [facility] 03/17/2023 1:06 PM

## 2023-03-17 NOTE — Plan of Care (Signed)
  Problem: Self-Concept: Goal: Level of anxiety will decrease Outcome: Progressing   Problem: Coping: Goal: Coping ability will improve Outcome: Progressing   Problem: Activity: Goal: Interest or engagement in leisure activities will improve Outcome: Not Progressing

## 2023-03-18 DIAGNOSIS — F200 Paranoid schizophrenia: Secondary | ICD-10-CM | POA: Diagnosis not present

## 2023-03-18 NOTE — Group Note (Signed)
Date:  03/18/2023 Time:  8:27 PM  Group Topic/Focus:  Overcoming Stress:   The focus of this group is to define stress and help patients assess their triggers.    Participation Level:  Minimal  Participation Quality:  Inattentive  Affect:  Defensive  Cognitive:  Confused  Insight: None  Engagement in Group:  None  Modes of Intervention:  Orientation  Additional Comments:    [name] 03/18/2023, 8:27 PM

## 2023-03-18 NOTE — Progress Notes (Signed)
 1:1 sitter in place for safety.    Patient is cooperative and pleasant.  Flat affect.  Alert and oriented x 1.  Loud speech. Endorses depression.  Denies SI/HI and AVH.  Pain reported 4/10 (generalized).  Dr. Marval Regal made aware.  Poor, interrupted sleep.    Complinat with scheduled medications.  15 min checks in place for safety.  Patient has been present in the milieu.  Minimal interaction with peers.

## 2023-03-18 NOTE — Plan of Care (Signed)
  Problem: Self-Concept: Goal: Level of anxiety will decrease Outcome: Progressing   Problem: Activity: Goal: Interest or engagement in leisure activities will improve Outcome: Not Progressing Goal: Imbalance in normal sleep/wake cycle will improve Outcome: Not Progressing

## 2023-03-18 NOTE — Progress Notes (Signed)
[facility] MD Progress Note  03/18/2023  [name]  MRN:  [id number] [name] is a 83 y.o. male admitted: Medically for 02/27/2023  9:59 AM for acute kidney injury, altered mental status. He carries the psychiatric diagnoses of schizophrenia and has a past medical history of protein-calorie malnutrition, neurosyphilis, hearing loss, hepatitis C, anemia, BPH, and repeated urinary tract infections in a male. His current presentation of altered mental status is most consistent with schizoaffective disorder with medication non-adherence. He meets criteria for schizophrenia based on historical chart data, current disorganization   Subjective:  Chart reviewed, case discussed in multidisciplinary meeting, patient seen during rounds. 83 year old male with a reported diagnosis of schizophrenia, noncompliance with medications was living with sister, patient is incontinent has challenges with vision and hearing.   Today during assessment patient was oriented to month and year.  Patient is focused on going home.  Patient was more cooperative and alert during assessment today.  He reports he slept good last night.  He denies any intention to harm himself or others.  He denies psychotic symptoms.  Patient has been more visible on the unit, he is observed sitting in day area watching TV at times.  Overall he is responding well to treatment.  Patient discharge plan discussed with the patient.  Anticipated discharge in 3 to 4 days   I have discussed patient's medications with Dr. Irwin Brakeman. Apparently patient received Haldol 100 mg IM on 03/10/2023, another dose was ordered for 03/17/2023.   Dr. Irwin Brakeman, and I agreed that the patient probably does not need 200 mg of Haldol Decanoate IM given within a week because of his age. Also added efficacy and benefits of Haldol decanoate at higher than 100 mg is questionable  Sleep: Fair  Appetite:  Fair  Past Psychiatric History: see h&P Family History: History reviewed.  No pertinent family history. Social History:  Social History   Substance and Sexual Activity  Alcohol Use No     Social History   Substance and Sexual Activity  Drug Use No    Social History   Socioeconomic History   Marital status: Single    Spouse name: Not on file   Number of children: Not on file   Years of education: Not on file   Highest education level: Not on file  Occupational History   Not on file  Tobacco Use   Smoking status: Former    Current packs/day: 0.00    Average packs/day: 1 pack/day for 20.0 years (20.0 ttl pk-yrs)    Types: Cigarettes    Start date: 06/11/1955    Quit date: 06/11/1975    Years since quitting: 47.8   Smokeless tobacco: Never  Vaping Use   Vaping status: Never Used  Substance and Sexual Activity   Alcohol use: No   Drug use: No   Sexual activity: Not on file  Other Topics Concern   Not on file  Social History Narrative   Not on file   Social Drivers of Health   Financial Resource Strain: Not on file  Food Insecurity: Patient Declined (03/10/2023)   Hunger Vital Sign    Worried About Running Out of Food in the Last Year: Patient declined    Ran Out of Food in the Last Year: Patient declined  Recent Concern: Food Insecurity - Food Insecurity Present (02/28/2023)   Hunger Vital Sign    Worried About Running Out of Food in the Last Year: Often true    Ran Out of  Food in the Last Year: Often true  Transportation Needs: Patient Declined (03/10/2023)   PRAPARE - Administrator, Civil Service (Medical): Patient declined    Lack of Transportation (Non-Medical): Patient declined  Physical Activity: Not on file  Stress: Not on file  Social Connections: Patient Declined (03/10/2023)   Social Connection and Isolation Panel [NHANES]    Frequency of Communication with Friends and Family: Patient declined    Frequency of Social Gatherings with Friends and Family: Patient declined    Attends Religious Services: Patient declined     Database administrator or Organizations: Patient declined    Attends Banker Meetings: Patient declined    Marital Status: Patient declined   Past Medical History:  Past Medical History:  Diagnosis Date   Acute cystitis without hematuria 02/28/2023   AKI (acute kidney injury) (HCC) 02/27/2023   AMS (altered mental status) 01/06/2023   Anemia 08/31/2012   Benign hypertrophy of prostate    Dysphagia 03/01/2023   Hepatitis C    Hypokalemia 02/06/2013   Schizophrenia (HCC)    Swelling of lower extremity 01/13/2023   Urinary tract infection    Urinary tract infection 06/13/2011   IMO SNOMED Dx Update Oct 2024     UTI (urinary tract infection) 06/13/2011   IMO SNOMED Dx Update Oct 2024      Past Surgical History:  Procedure Laterality Date   KNEE SURGERY     right    Current Medications: Current Facility-Administered Medications  Medication Dose Route Frequency Provider Last Rate Last Admin   benztropine (COGENTIN) tablet 0.5 mg  0.5 mg Oral BID PRN Verner Chol, MD       diltiazem (CARDIZEM CD) 24 hr capsule 180 mg  180 mg Oral Daily Mariel Craft, MD   180 mg at 03/18/23 0845   docusate sodium (COLACE) capsule 100 mg  100 mg Oral BID Lewanda Rife, MD   100 mg at 03/18/23 0844   feeding supplement (ENSURE ENLIVE / ENSURE PLUS) liquid 237 mL  237 mL Oral BID BM Mariel Craft, MD   237 mL at 03/18/23 1410   haloperidol (HALDOL) tablet 10 mg  10 mg Oral BID Mariel Craft, MD   10 mg at 03/18/23 0844   haloperidol (HALDOL) tablet 5 mg  5 mg Oral Q8H PRN Mariel Craft, MD       Or   haloperidol lactate (HALDOL) injection 5 mg  5 mg Intramuscular Q8H PRN Mariel Craft, MD       magnesium hydroxide (MILK OF MAGNESIA) suspension 15 mL  15 mL Oral Daily PRN Lewanda Rife, MD       multivitamin with minerals tablet 1 tablet  1 tablet Oral Daily Mariel Craft, MD   1 tablet at 03/18/23 1610   Oral care mouth rinse  15 mL Mouth Rinse PRN Mariel Craft, MD       tamsulosin [facility]) capsule 0.4 mg  0.4 mg Oral Daily Mariel Craft, MD   0.4 mg at 03/18/23 9604   thiamine (VITAMIN B1) tablet 100 mg  100 mg Oral Daily Mariel Craft, MD   100 mg at 03/18/23 5409    Lab Results: No results found for this or any previous visit (from the past 48 hours).  Blood Alcohol level:  Lab Results  Component Value Date   [facility] <10 02/08/2023   ETH <10 01/06/2023      Psychiatric Specialty Exam:  Presentation  General Appearance:  Disheveled  Eye Contact:  patient is blind  Speech: Improved, more spontaneous  Speech Volume: Normal    Mood and Affect  Mood: "Fine"  Affect: Less constricted   Thought Process  Thought Processes: Improved  Descriptions of Associations: Intact  Orientation:Partial  Thought Content: Off-and-on paranoia  Hallucinations: Denies  Ideas of Reference:None  Suicidal Thoughts: Denies  Homicidal Thoughts: Denies  Sensorium  Memory: Immediate Fair; Recent Poor; Remote Poor  Judgment: Improving  Insight: Shallow   Executive Functions  Concentration: Improving  Attention Span: Improving  Recall: Poor  Fund of Knowledge: Fair  Language: Fair   Psychomotor Activity  Psychomotor Activity: Decreased   Musculoskeletal: Strength & Muscle Tone: within normal limits Gait & Station: patient uses walker Assets  Assets: Manufacturing systems engineer; Physical Health; Resilience    Physical Exam: Physical Exam HENT:     Head: Normocephalic and atraumatic.     Nose: No congestion.  Cardiovascular:     Rate and Rhythm: Regular rhythm.  Pulmonary:     Effort: Pulmonary effort is normal.  Skin:    General: Skin is warm.  Neurological:     Mental Status: He is alert.    Review of Systems  Constitutional:  Negative for chills and fever.  HENT:  Positive for hearing loss. Negative for sore throat.   Respiratory:  Negative for cough and shortness of breath.    Cardiovascular:  Negative for chest pain and palpitations.  Gastrointestinal:  Negative for heartburn and nausea.  Neurological:  Negative for dizziness.  Psychiatric/Behavioral:  Positive for memory loss.    Blood pressure 128/68, pulse 95, temperature 98 F (36.7 C), resp. rate 18, height 5\' 8"  (1.727 m), weight 75.5 kg, SpO2 100%. Body mass index is 25.32 kg/m.  Diagnosis: Principal Problem:   Schizophrenia [facility])   PLAN:Clinical Decision Making: Patient with history of schizoaffective disorder, currently not taking his medications, lacks insight, very paranoid and aggressive towards the staff, disorganized thought process, thinking about the hospital as a jail and demanding for an attorney.  He needs inpatient psychiatric hospitalization for stabilization  Treatment Plan Summary:  Safety and Monitoring:             -- Involuntary admission to inpatient psychiatric unit for safety, stabilization and treatment             -- Daily contact with patient to assess and evaluate symptoms and progress in treatment             -- Patient's case to be discussed in multi-disciplinary team meeting             -- Observation Level: q15 minute checks             -- Vital signs:  q12 hours             -- Precautions: suicide, elopement, and assault   2. Psychiatric Diagnoses and Treatment:               Continue Haldol 10 mg twice daily per chart his next dose of Haldol Decanoate is due 03-17-2023 Add Cogentin 0.5 mg twice daily as needed for EPS               -- Encouraged patient to participate in unit milieu and in scheduled group therapies                            3. Discharge Planning:              --  Social work and case management to assist with discharge planning and identification of hospital follow-up needs prior to discharge                        -- ELOS: 2-3 days             Lewanda Rife, MD

## 2023-03-18 NOTE — Plan of Care (Signed)
Pt remains on 1:1 due to High Falls Risk. Q38min observation in place. Pt is very hard of hearing and visually impaired. Pt will participate in assessment when possible. Pt can be irritable and require redirection but will comply. Pt took evening meds without difficultly. Pt denies SI/HI/AVH. Pt denies any anxiety or depression. Pt states LBM 03/17/23. Pt slept 7hrs throughout the night with frequent wakenings.     03/17/23 2100  Psych Admission Type (Psych Patients Only)  Admission Status Involuntary  Psychosocial Assessment  Patient Complaints None  Eye Contact Brief  Facial Expression Flat  Affect Blunted  Speech Loud  Interaction Minimal  Motor Activity Unsteady  Appearance/Hygiene In scrubs  Behavior Characteristics Cooperative  Mood Euthymic  Thought Process  Coherency Tangential  Content Preoccupation  Delusions None reported or observed  Perception WDL  Hallucination None reported or observed  Judgment Impaired  Confusion Mild  Danger to Self  Current suicidal ideation? Denies  Danger to Others  Danger to Others None reported or observed    Problem: Education: Goal: Ability to state activities that reduce stress will improve Outcome: Progressing   Problem: Self-Concept: Goal: Ability to identify factors that promote anxiety will improve Outcome: Progressing   Problem: Education: Goal: Utilization of techniques to improve thought processes will improve Outcome: Progressing   Problem: Activity: Goal: Interest or engagement in activities will improve Outcome: Progressing

## 2023-03-18 NOTE — Progress Notes (Signed)
Occupational Therapy Group Note   Group Topic:  Estate manager/land agent, Adaptive Equipment for ADLs Group Date: 03/18/23 Group Time (start and end): 8469-6295 Facilitators:  Wynona Canes, OT   Group Description: Group educated on safety considerations/modifications with bathing, dressing and ADL routines to maximize independence and minimize fall risk with tasks.  Reviewed and demonstrated use of shower chair and tub transfer bench as appropriate.  Reviewed and demonstrated use of adaptive equipment (sock aide, reacher, long-handled sponge) available for ADL tasks.  Reviewed and demonstrated role of activity pacing and energy conservation with functional activities.  Provided handout with visual reference of available equipment.      Therapeutic Goal(s): Verbalize and demonstrate safe technique for tub/shower transfers with DME/adaptive equipment as needed. Verbalize and demonstrate appropriate use of adaptive equipment with bathing, dressing and ADL routine. Verbalize and demonstrate appropriate use of activity pacing and energy conservation with bathing, dressing and ADL routine.   Individual Participation: Pt was initially engaged, making eye contact with facilitator and reviewing handout provided to him. Limited engagement afterwards as he fell asleep for >50% of group.     Participation Level and Quality: Minimally engaged, slept through >50% of group    Behavior: Appropriate     Speech/Thought Process: Did not speak, did review the handout thoroughly    Affect/Mood: Appropriate     Insight: Difficult to assess given participation level    Judgement: Difficult to assess given participation level    Modes of Intervention: Verbal education, visual demonstration, hands on opportunities, handout        Plan: Continue to engage patient in PT/OT groups 1-2x/week.    Arman Filter., MPH, MS, OTR/L ascom [phone] 03/18/23, 4:27 PM

## 2023-03-19 DIAGNOSIS — F200 Paranoid schizophrenia: Secondary | ICD-10-CM | POA: Diagnosis not present

## 2023-03-19 NOTE — Plan of Care (Signed)
  Problem: Education: Goal: Ability to state activities that reduce stress will improve Outcome: Not Progressing   Problem: Coping: Goal: Ability to identify and develop effective coping behavior will improve Outcome: Not Progressing   Problem: Self-Concept: Goal: Ability to identify factors that promote anxiety will improve Outcome: Not Progressing Goal: Level of anxiety will decrease Outcome: Not Progressing Goal: Ability to modify response to factors that promote anxiety will improve Outcome: Not Progressing   Problem: Education: Goal: Utilization of techniques to improve thought processes will improve Outcome: Not Progressing Goal: Knowledge of the prescribed therapeutic regimen will improve Outcome: Not Progressing   Problem: Activity: Goal: Interest or engagement in leisure activities will improve Outcome: Not Progressing Goal: Imbalance in normal sleep/wake cycle will improve Outcome: Not Progressing   Problem: Coping: Goal: Coping ability will improve Outcome: Not Progressing Goal: Will verbalize feelings Outcome: Not Progressing   Problem: Health Behavior/Discharge Planning: Goal: Ability to make decisions will improve Outcome: Not Progressing Goal: Compliance with therapeutic regimen will improve Outcome: Not Progressing   Problem: Role Relationship: Goal: Will demonstrate positive changes in social behaviors and relationships Outcome: Not Progressing   Problem: Safety: Goal: Ability to disclose and discuss suicidal ideas will improve Outcome: Not Progressing Goal: Ability to identify and utilize support systems that promote safety will improve Outcome: Not Progressing   Problem: Self-Concept: Goal: Will verbalize positive feelings about self Outcome: Not Progressing Goal: Level of anxiety will decrease Outcome: Not Progressing   Problem: Education: Goal: Knowledge of [place] [facility] Education information/materials will improve Outcome: Not  Progressing Goal: Emotional status will improve Outcome: Not Progressing Goal: Mental status will improve Outcome: Not Progressing Goal: Verbalization of understanding the information provided will improve Outcome: Not Progressing   Problem: Activity: Goal: Interest or engagement in activities will improve Outcome: Not Progressing Goal: Sleeping patterns will improve Outcome: Not Progressing   Problem: Coping: Goal: Ability to verbalize frustrations and anger appropriately will improve Outcome: Not Progressing Goal: Ability to demonstrate self-control will improve Outcome: Not Progressing   Problem: Health Behavior/Discharge Planning: Goal: Identification of resources available to assist in meeting health care needs will improve Outcome: Not Progressing Goal: Compliance with treatment plan for underlying cause of condition will improve Outcome: Not Progressing   Problem: Physical Regulation: Goal: Ability to maintain clinical measurements within normal limits will improve Outcome: Not Progressing   Problem: Safety: Goal: Periods of time without injury will increase Outcome: Not Progressing   

## 2023-03-19 NOTE — BH IP Treatment Plan (Signed)
 Interdisciplinary Treatment and Diagnostic Plan Update  03/19/2023 Time of Session: 11:40 am [name] MRN: [id number]  Principal Diagnosis: Schizophrenia [facility])  Secondary Diagnoses: Principal Problem:   Schizophrenia (HCC)   Current Medications:  Current Facility-Administered Medications  Medication Dose Route Frequency Provider Last Rate Last Admin   benztropine (COGENTIN) tablet 0.5 mg  0.5 mg Oral BID PRN Verner Chol, MD       diltiazem (CARDIZEM CD) 24 hr capsule 180 mg  180 mg Oral Daily Mariel Craft, MD   180 mg at 03/19/23 0902   feeding supplement (ENSURE ENLIVE / ENSURE PLUS) liquid 237 mL  237 mL Oral BID BM Mariel Craft, MD   237 mL at 03/19/23 0905   haloperidol (HALDOL) tablet 10 mg  10 mg Oral BID Mariel Craft, MD   10 mg at 03/19/23 0902   haloperidol (HALDOL) tablet 5 mg  5 mg Oral Q8H PRN Mariel Craft, MD       Or   haloperidol lactate (HALDOL) injection 5 mg  5 mg Intramuscular Q8H PRN Mariel Craft, MD       magnesium hydroxide (MILK OF MAGNESIA) suspension 15 mL  15 mL Oral Daily PRN Lewanda Rife, MD       multivitamin with minerals tablet 1 tablet  1 tablet Oral Daily Mariel Craft, MD   1 tablet at 03/19/23 7829   Oral care mouth rinse  15 mL Mouth Rinse PRN Mariel Craft, MD       tamsulosin [facility]) capsule 0.4 mg  0.4 mg Oral Daily Mariel Craft, MD   0.4 mg at 03/19/23 5621   thiamine (VITAMIN B1) tablet 100 mg  100 mg Oral Daily Mariel Craft, MD   100 mg at 03/19/23 3086   PTA Medications: Medications Prior to Admission  Medication Sig Dispense Refill Last Dose/Taking   acetaminophen (TYLENOL) 325 MG tablet Take 2 tablets (650 mg total) by mouth every 6 (six) hours as needed for mild pain (pain score 1-3) (or Fever >/= 101).      acetaminophen (TYLENOL) 650 MG suppository Place 1 suppository (650 mg total) rectally every 6 (six) hours as needed for mild pain (pain score 1-3) (or Fever >/= 101).      diltiazem  (CARDIZEM CD) 180 MG 24 hr capsule Take 1 capsule (180 mg total) by mouth daily.      feeding supplement (ENSURE ENLIVE / ENSURE PLUS) LIQD Take 237 mLs by mouth 2 (two) times daily between meals.      haloperidol (HALDOL) 10 MG tablet Take 1 tablet (10 mg total) by mouth 2 (two) times daily.      haloperidol decanoate (HALDOL DECANOATE) 100 MG/ML injection Inject 1 mL (100 mg total) into the muscle once a week for 7 days. Pt received 1st IM injection on 2/13, needs to receive 2nd IM injection      Mouthwashes (MOUTH RINSE) LIQD solution 15 mLs by Mouth Rinse route as needed (for oral care).      Multiple Vitamin (MULTIVITAMIN WITH MINERALS) TABS tablet Take 1 tablet by mouth daily.      tamsulosin (FLOMAX) 0.4 MG CAPS capsule Take 1 capsule (0.4 mg total) by mouth daily. 30 capsule 4    thiamine (VITAMIN B-1) 100 MG tablet Take 1 tablet (100 mg total) by mouth daily.       Patient Stressors: Financial difficulties   Health problems    Patient Strengths: Motivation for treatment/growth  Religious  Affiliation   Treatment Modalities: Medication Management, Group therapy, Case management,  1 to 1 session with clinician, Psychoeducation, Recreational therapy.   Physician Treatment Plan for Primary Diagnosis: Schizophrenia (HCC) Long Term Goal(s): Improvement in symptoms so as ready for discharge   Short Term Goals: Ability to identify changes in lifestyle to reduce recurrence of condition will improve Ability to verbalize feelings will improve Ability to disclose and discuss suicidal ideas Ability to demonstrate self-control will improve Ability to identify and develop effective coping behaviors will improve Ability to maintain clinical measurements within normal limits will improve Compliance with prescribed medications will improve Ability to identify triggers associated with substance abuse/mental health issues will improve  Medication Management: Evaluate patient's response, side  effects, and tolerance of medication regimen.  Therapeutic Interventions: 1 to 1 sessions, Unit Group sessions and Medication administration.  Evaluation of Outcomes: Progressing  Physician Treatment Plan for Secondary Diagnosis: Principal Problem:   Schizophrenia (HCC)  Long Term Goal(s): Improvement in symptoms so as ready for discharge   Short Term Goals: Ability to identify changes in lifestyle to reduce recurrence of condition will improve Ability to verbalize feelings will improve Ability to disclose and discuss suicidal ideas Ability to demonstrate self-control will improve Ability to identify and develop effective coping behaviors will improve Ability to maintain clinical measurements within normal limits will improve Compliance with prescribed medications will improve Ability to identify triggers associated with substance abuse/mental health issues will improve     Medication Management: Evaluate patient's response, side effects, and tolerance of medication regimen.  Therapeutic Interventions: 1 to 1 sessions, Unit Group sessions and Medication administration.  Evaluation of Outcomes: Progressing   RN Treatment Plan for Primary Diagnosis: Schizophrenia (HCC) Long Term Goal(s): Knowledge of disease and therapeutic regimen to maintain health will improve  Short Term Goals: Ability to remain free from injury will improve, Ability to verbalize frustration and anger appropriately will improve, Ability to demonstrate self-control, Ability to participate in decision making will improve, Ability to verbalize feelings will improve, and Compliance with prescribed medications will improve  Medication Management: RN will administer medications as ordered by provider, will assess and evaluate patient's response and provide education to patient for prescribed medication. RN will report any adverse and/or side effects to prescribing provider.  Therapeutic Interventions: 1 on 1 counseling  sessions, Psychoeducation, Medication administration, Evaluate responses to treatment, Monitor vital signs and CBGs as ordered, Perform/monitor CIWA, COWS, AIMS and Fall Risk screenings as ordered, Perform wound care treatments as ordered.  Evaluation of Outcomes: Progressing   LCSW Treatment Plan for Primary Diagnosis: Schizophrenia (HCC) Long Term Goal(s): Safe transition to appropriate next level of care at discharge, Engage patient in therapeutic group addressing interpersonal concerns.  Short Term Goals: Engage patient in aftercare planning with referrals and resources, Increase social support, Increase ability to appropriately verbalize feelings, Increase emotional regulation, Facilitate acceptance of mental health diagnosis and concerns, Facilitate patient progression through stages of change regarding substance use diagnoses and concerns, and Identify triggers associated with mental health/substance abuse issues  Therapeutic Interventions: Assess for all discharge needs, 1 to 1 time with Social worker, Explore available resources and support systems, Assess for adequacy in community support network, Educate family and significant other(s) on suicide prevention, Complete Psychosocial Assessment, Interpersonal group therapy.  Evaluation of Outcomes: Progressing   Progress in Treatment: Attending groups: No. Participating in groups: No. Taking medication as prescribed: Yes. Toleration medication: Yes. Family/Significant other contact made: Yes, individual(s) contacted:  [name], sister, [phone]  Patient  understands diagnosis: Yes. Discussing patient identified problems/goals with staff: Yes. Medical problems stabilized or resolved: Yes. Denies suicidal/homicidal ideation: Yes. Issues/concerns per patient self-inventory: No. Other: None  New problem(s) identified: No, Describe:  None identified Update 03/16/23: No changes at this time Update 03/19/23: No changes at this time.     New Short Term/Long Term Goal(s):  elimination of symptoms of psychosis, medication management for mood stabilization; elimination of SI thoughts; development of comprehensive mental wellness plan.  Update 03/16/23: No changes at this time  Update 03/19/23: No changes at this time.    Patient Goals:  " I want to go"  Update 03/16/23: No changes at this time   Update 03/19/23: No changes at this time.    Discharge Plan or Barriers: CSW to assist with discharge planning  Update 03/16/23: No changes at this time  Update 03/19/23: No changes at this time.    Reason for Continuation of Hospitalization: Medication stabilization   Estimated Length of Stay: 1 to 7 days  Update 03/16/23:TBD   Update 03/19/23: TBD  Last 3 [place] Suicide Severity Risk Score: Flowsheet Row Admission (Current) from 03/10/2023 in [facility] ED to Hosp-Admission (Discharged) from 02/27/2023 in [facility] ED from 02/26/2023 in [facility] Department at [facility]  C-SSRS RISK CATEGORY No Risk No Risk No Risk       Last [facility] 2/9 Scores:     No data to display          Scribe for Treatment Team: Marshell Levan, [place] 03/19/2023 11:39 AM

## 2023-03-19 NOTE — Group Note (Signed)
 [facility] LCSW Group Therapy Note   Group Date: 03/19/2023 Start Time: 1305 End Time: 1335   Type of Therapy/Topic:  Group Therapy:  Emotion Regulation  Participation Level:  None   Mood:  Description of Group:    The purpose of this group is to assist patients in learning to regulate negative emotions and experience positive emotions. Patients will be guided to discuss ways in which they have been vulnerable to their negative emotions. These vulnerabilities will be juxtaposed with experiences of positive emotions or situations, and patients challenged to use positive emotions to combat negative ones. Special emphasis will be placed on coping with negative emotions in conflict situations, and patients will process healthy conflict resolution skills.  Therapeutic Goals: Patient will identify two positive emotions or experiences to reflect on in order to balance out negative emotions:  Patient will label two or more emotions that they find the most difficult to experience:  Patient will be able to demonstrate positive conflict resolution skills through discussion or role plays:   Summary of Patient Progress:   Patient was alert and sat quietly in group. Patient was attentive and non-verbal during group.       Therapeutic Modalities:   Cognitive Behavioral Therapy Feelings Identification Dialectical Behavioral Therapy   Whitney Post, LCSWA

## 2023-03-19 NOTE — Progress Notes (Signed)
   03/19/23 0736  Psych Admission Type (Psych Patients Only)  Admission Status Involuntary  Psychosocial Assessment  Patient Complaints Irritability  Eye Contact Fair  Facial Expression Flat  Affect Sullen  Speech Logical/coherent  Interaction Minimal  Motor Activity Unsteady  Appearance/Hygiene In scrubs  Behavior Characteristics Guarded  Mood Sullen  Thought Process  Coherency WDL  Content WDL  Delusions None reported or observed  Perception WDL  Hallucination None reported or observed  Judgment Impaired  Confusion Mild  Danger to Self  Current suicidal ideation? Denies  Danger to Others  Danger to Others None reported or observed

## 2023-03-19 NOTE — Progress Notes (Signed)
 [facility] MD Progress Note  03/19/2023  [name]  MRN:  [id number] [name] is a 83 y.o. male admitted: Medically for 02/27/2023  9:59 AM for acute kidney injury, altered mental status. He carries the psychiatric diagnoses of schizophrenia and has a past medical history of protein-calorie malnutrition, neurosyphilis, hearing loss, hepatitis C, anemia, BPH, and repeated urinary tract infections in a male. His current presentation of altered mental status is most consistent with schizoaffective disorder with medication non-adherence. He meets criteria for schizophrenia based on historical chart data, current disorganization   Subjective:  Chart reviewed, case discussed in multidisciplinary meeting, patient seen during rounds.  Not much change in mental status since yesterday.  No new acute events overnight.  Today during assessment patient was oriented to month and year.  Patient is focused on going home.  He reports he slept good last night.  He denies any intention to harm himself or others.  He denies psychotic symptoms.  Patient has been more visible on the unit, Overall he is responding well to treatment.  Patient discharge plan discussed with the patient.  Anticipated discharge in 1-2 days   I have discussed patient's medications with Dr. Irwin Brakeman. Apparently patient received Haldol 100 mg IM on 03/10/2023, another dose was ordered for 03/17/2023.   Dr. Irwin Brakeman, and I agreed that the patient probably does not need 200 mg of Haldol Decanoate IM given within a week because of his age. Also added efficacy and benefits of Haldol decanoate at higher than 100 mg is questionable  Sleep: Fair  Appetite:  Fair  Past Psychiatric History: see h&P Family History: History reviewed. No pertinent family history. Social History:  Social History   Substance and Sexual Activity  Alcohol Use No     Social History   Substance and Sexual Activity  Drug Use No    Social History   Socioeconomic  History  . Marital status: Single    Spouse name: Not on file  . Number of children: Not on file  . Years of education: Not on file  . Highest education level: Not on file  Occupational History  . Not on file  Tobacco Use  . Smoking status: Former    Current packs/day: 0.00    Average packs/day: 1 pack/day for 20.0 years (20.0 ttl pk-yrs)    Types: Cigarettes    Start date: 06/11/1955    Quit date: 06/11/1975    Years since quitting: 47.8  . Smokeless tobacco: Never  Vaping Use  . Vaping status: Never Used  Substance and Sexual Activity  . Alcohol use: No  . Drug use: No  . Sexual activity: Not on file  Other Topics Concern  . Not on file  Social History Narrative  . Not on file   Social Drivers of Health   Financial Resource Strain: Not on file  Food Insecurity: Patient Declined (03/10/2023)   Hunger Vital Sign   . Worried About Programme researcher, broadcasting/film/video in the Last Year: Patient declined   . Ran Out of Food in the Last Year: Patient declined  Recent Concern: Food Insecurity - Food Insecurity Present (02/28/2023)   Hunger Vital Sign   . Worried About Programme researcher, broadcasting/film/video in the Last Year: Often true   . Ran Out of Food in the Last Year: Often true  Transportation Needs: Patient Declined (03/10/2023)   PRAPARE - Transportation   . Lack of Transportation (Medical): Patient declined   . Lack of Transportation (Non-Medical): Patient  declined  Physical Activity: Not on file  Stress: Not on file  Social Connections: Patient Declined (03/10/2023)   Social Connection and Isolation Panel [NHANES]   . Frequency of Communication with Friends and Family: Patient declined   . Frequency of Social Gatherings with Friends and Family: Patient declined   . Attends Religious Services: Patient declined   . Active Member of Clubs or Organizations: Patient declined   . Attends Banker Meetings: Patient declined   . Marital Status: Patient declined   Past Medical History:  Past  Medical History:  Diagnosis Date  . Acute cystitis without hematuria 02/28/2023  . AKI (acute kidney injury) (HCC) 02/27/2023  . AMS (altered mental status) 01/06/2023  . Anemia 08/31/2012  . Benign hypertrophy of prostate   . Dysphagia 03/01/2023  . Hepatitis C   . Hypokalemia 02/06/2013  . Schizophrenia (HCC)   . Swelling of lower extremity 01/13/2023  . Urinary tract infection   . Urinary tract infection 06/13/2011   IMO SNOMED Dx Update Oct 2024    . UTI (urinary tract infection) 06/13/2011   IMO SNOMED Dx Update Oct 2024      Past Surgical History:  Procedure Laterality Date  . KNEE SURGERY     right    Current Medications: Current Facility-Administered Medications  Medication Dose Route Frequency Provider Last Rate Last Admin  . benztropine (COGENTIN) tablet 0.5 mg  0.5 mg Oral BID PRN Verner Chol, MD      . diltiazem (CARDIZEM CD) 24 hr capsule 180 mg  180 mg Oral Daily Mariel Craft, MD   180 mg at 03/19/23 0902  . feeding supplement (ENSURE ENLIVE / ENSURE PLUS) liquid 237 mL  237 mL Oral BID BM Mariel Craft, MD   237 mL at 03/19/23 9604  . haloperidol (HALDOL) tablet 10 mg  10 mg Oral BID Mariel Craft, MD   10 mg at 03/19/23 5409  . haloperidol (HALDOL) tablet 5 mg  5 mg Oral Q8H PRN Mariel Craft, MD       Or  . haloperidol lactate (HALDOL) injection 5 mg  5 mg Intramuscular Q8H PRN Mariel Craft, MD      . magnesium hydroxide (MILK OF MAGNESIA) suspension 15 mL  15 mL Oral Daily PRN Lewanda Rife, MD      . multivitamin with minerals tablet 1 tablet  1 tablet Oral Daily Mariel Craft, MD   1 tablet at 03/19/23 8119  . Oral care mouth rinse  15 mL Mouth Rinse PRN Mariel Craft, MD      . tamsulosin [facility]) capsule 0.4 mg  0.4 mg Oral Daily Mariel Craft, MD   0.4 mg at 03/19/23 1478  . thiamine (VITAMIN B1) tablet 100 mg  100 mg Oral Daily Mariel Craft, MD   100 mg at 03/19/23 2956    Lab Results: No results found for this or  any previous visit (from the past 48 hours).  Blood Alcohol level:  Lab Results  Component Value Date   ETH <10 02/08/2023   ETH <10 01/06/2023      Psychiatric Specialty Exam:  Presentation  General Appearance:  Disheveled  Eye Contact:  Fleeting, questionable vision impairment  Speech: Improved, more spontaneous  Speech Volume: Normal    Mood and Affect  Mood: "Fine"  Affect: Less constricted   Thought Process  Thought Processes: Improved  Descriptions of Associations: Intact  Orientation:Partial  Thought Content: Off-and-on paranoia  Hallucinations:  Denies  Ideas of Reference:None  Suicidal Thoughts: Denies  Homicidal Thoughts: Denies  Sensorium  Memory: Immediate Fair; Recent Poor; Remote Poor  Judgment: Improving  Insight: Shallow   Executive Functions  Concentration: Improving  Attention Span: Improving  Recall: Poor  Fund of Knowledge: Fair  Language: Fair   Psychomotor Activity  Psychomotor Activity: Decreased   Musculoskeletal: Strength & Muscle Tone: within normal limits Gait & Station: patient uses walker Assets  Assets: Manufacturing systems engineer; Physical Health; Resilience    Physical Exam: Physical Exam HENT:     Head: Normocephalic and atraumatic.     Nose: No congestion.  Cardiovascular:     Rate and Rhythm: Regular rhythm.  Pulmonary:     Effort: Pulmonary effort is normal.  Skin:    General: Skin is warm.  Neurological:     Mental Status: He is alert.   Review of Systems  Constitutional:  Negative for chills and fever.  HENT:  Positive for hearing loss. Negative for sore throat.   Respiratory:  Negative for cough and shortness of breath.   Cardiovascular:  Negative for chest pain and palpitations.  Gastrointestinal:  Negative for heartburn and nausea.  Neurological:  Negative for dizziness.  Psychiatric/Behavioral:  Positive for memory loss.    Blood pressure (!) 141/76, pulse 82,  temperature 97.8 F (36.6 C), resp. rate 18, height 5\' 8"  (1.727 m), weight 75.5 kg, SpO2 100%. Body mass index is 25.32 kg/m.  Diagnosis: Principal Problem:   Schizophrenia [facility])   PLAN:Clinical Decision Making: Patient with history of schizoaffective disorder, currently not taking his medications, lacks insight, very paranoid and aggressive towards the staff, disorganized thought process, thinking about the hospital as a jail and demanding for an attorney.  He needs inpatient psychiatric hospitalization for stabilization  Treatment Plan Summary:  Safety and Monitoring:             -- Involuntary admission to inpatient psychiatric unit for safety, stabilization and treatment             -- Daily contact with patient to assess and evaluate symptoms and progress in treatment             -- Patient's case to be discussed in multi-disciplinary team meeting             -- Observation Level: q15 minute checks             -- Vital signs:  q12 hours             -- Precautions: suicide, elopement, and assault   2. Psychiatric Diagnoses and Treatment:               Continue Haldol 10 mg twice daily  Haldol Dec 100 mg IM given 03/10/23 Cogentin 0.5 mg twice daily as needed for EPS               -- Encouraged patient to participate in unit milieu and in scheduled group therapies                            3. Discharge Planning:              -- Social work and case management to assist with discharge planning and identification of hospital follow-up needs prior to discharge                        -- ELOS: 2-3 days  Lewanda Rife, MD

## 2023-03-19 NOTE — Plan of Care (Addendum)
 Pt remains on 1:1 for safety. Pt remains med compliant and slept through the night uninterrupted. LBM 03/18/23. Denies SI/HI/AVH. Pt slept 9.25hrs. Will continue to monitor   03/18/23 2236  Psych Admission Type (Psych Patients Only)  Admission Status Involuntary  Psychosocial Assessment  Patient Complaints Depression;Irritability  Eye Contact Brief  Facial Expression Flat  Affect Flat  Speech Loud  Interaction Minimal  Motor Activity Slow;Unsteady  Appearance/Hygiene In scrubs  Behavior Characteristics Guarded;Irritable  Mood Irritable  Thought Process  Coherency Disorganized  Content Preoccupation  Delusions None reported or observed  Perception WDL  Hallucination None reported or observed  Judgment Impaired  Confusion Mild  Danger to Self  Current suicidal ideation? Denies  Danger to Others  Danger to Others None reported or observed   Problem: Self-Concept: Goal: Ability to identify factors that promote anxiety will improve Outcome: Progressing Goal: Level of anxiety will decrease Outcome: Progressing Goal: Ability to modify response to factors that promote anxiety will improve Outcome: Progressing

## 2023-03-20 DIAGNOSIS — F200 Paranoid schizophrenia: Secondary | ICD-10-CM | POA: Diagnosis not present

## 2023-03-20 NOTE — Group Note (Signed)
 Date:  03/19/2023 Time:  8:30 PM  Group Topic/Focus:  Self Care:   The focus of this group is to help patients understand the importance of self-care in order to improve or restore emotional, physical, spiritual, interpersonal, and financial health.    Participation Level:  Did Not Attend  Participation Quality:   Did Not Attend  Affect:   Did Not Attend  Cognitive:   Did Not Attend  Insight: None  Engagement in Group:  None  Modes of Intervention:  Orientation  Additional Comments:    [name] 03/19/2023, 08:30 PM

## 2023-03-20 NOTE — Progress Notes (Signed)
   03/20/23 0915  Psych Admission Type (Psych Patients Only)  Admission Status Involuntary  Psychosocial Assessment  Patient Complaints None  Eye Contact Fair  Facial Expression Flat  Affect Depressed  Speech Logical/coherent  Interaction Minimal  Motor Activity Unsteady  Appearance/Hygiene In scrubs  Behavior Characteristics Guarded  Mood Sullen  Thought Process  Coherency WDL  Content WDL  Delusions None reported or observed  Perception WDL  Hallucination None reported or observed  Judgment Impaired  Confusion Mild  Danger to Self  Current suicidal ideation? Denies  Danger to Others  Danger to Others None reported or observed   D: Patient is alert and oriented to person and remains on 1:1 observation due to a high risk for falls. Patient denies experiencing anxiety, depression, pain, suicidal or homicidal ideation, or auditory or visual hallucinations at this time. The patient had an adequate meal intake throughout the shift (breakfast, lunch, and dinner). Patient spent most of the shift in the dayroom watching TV, occasionally ambulating in hallway with front walker with standby staff assist. Patient required occasional verbal redirection related to fall risk.  A: Scheduled medications administered per provider orders. Support and encouragement provided, with frequent verbal contact. Routine safety checks conducted every 15 minutes.  R: No adverse drug reactions were observed. Patient verbally contracts for safety and remains compliant with medications. Plan of care ongoing.

## 2023-03-20 NOTE — Progress Notes (Signed)
 [facility] MD Progress Note  03/20/2023  [name]  MRN:  [id number] [name] is a 83 y.o. male admitted: Medically for 02/27/2023  9:59 AM for acute kidney injury, altered mental status. He carries the psychiatric diagnoses of schizophrenia and has a past medical history of protein-calorie malnutrition, neurosyphilis, hearing loss, hepatitis C, anemia, BPH, and repeated urinary tract infections in a male. His current presentation of altered mental status is most consistent with schizoaffective disorder with medication non-adherence. He meets criteria for schizophrenia based on historical chart data, current disorganization   Subjective:  Chart reviewed, case discussed in multidisciplinary meeting, patient seen during rounds.  Not much change in mental status since yesterday.  No new acute events overnight.  Today during assessment patient was oriented to month and year.  Patient is focused on going home. He was encouraged to talk to his sister about the discharge date. SW to confirm patient's baseline with patient's sister.  He reports he slept good last night.  He denies any intention to harm himself or others.  He denies psychotic symptoms.  Patient has been more visible on the unit, Overall he is responding well to treatment.    I have discussed patient's medications with Dr. Irwin Brakeman. Apparently patient received Haldol 100 mg IM on 03/10/2023, another dose was ordered for 03/17/2023.   Dr. Irwin Brakeman, and I agreed that the patient probably does not need 200 mg of Haldol Decanoate IM given within a week because of his age. Also added efficacy and benefits of Haldol decanoate at higher than 100 mg is questionable  Sleep: Fair  Appetite:  Fair  Past Psychiatric History: see h&P Family History: History reviewed. No pertinent family history. Social History:  Social History   Substance and Sexual Activity  Alcohol Use No     Social History   Substance and Sexual Activity  Drug Use No     Social History   Socioeconomic History   Marital status: Single    Spouse name: Not on file   Number of children: Not on file   Years of education: Not on file   Highest education level: Not on file  Occupational History   Not on file  Tobacco Use   Smoking status: Former    Current packs/day: 0.00    Average packs/day: 1 pack/day for 20.0 years (20.0 ttl pk-yrs)    Types: Cigarettes    Start date: 06/11/1955    Quit date: 06/11/1975    Years since quitting: 47.8   Smokeless tobacco: Never  Vaping Use   Vaping status: Never Used  Substance and Sexual Activity   Alcohol use: No   Drug use: No   Sexual activity: Not on file  Other Topics Concern   Not on file  Social History Narrative   Not on file   Social Drivers of Health   Financial Resource Strain: Not on file  Food Insecurity: Patient Declined (03/10/2023)   Hunger Vital Sign    Worried About Running Out of Food in the Last Year: Patient declined    Ran Out of Food in the Last Year: Patient declined  Recent Concern: Food Insecurity - Food Insecurity Present (02/28/2023)   Hunger Vital Sign    Worried About Running Out of Food in the Last Year: Often true    Ran Out of Food in the Last Year: Often true  Transportation Needs: Patient Declined (03/10/2023)   PRAPARE - Administrator, Civil Service (Medical): Patient declined  Lack of Transportation (Non-Medical): Patient declined  Physical Activity: Not on file  Stress: Not on file  Social Connections: Patient Declined (03/10/2023)   Social Connection and Isolation Panel [NHANES]    Frequency of Communication with Friends and Family: Patient declined    Frequency of Social Gatherings with Friends and Family: Patient declined    Attends Religious Services: Patient declined    Active Member of Clubs or Organizations: Patient declined    Attends Banker Meetings: Patient declined    Marital Status: Patient declined   Past Medical History:   Past Medical History:  Diagnosis Date   Acute cystitis without hematuria 02/28/2023   AKI (acute kidney injury) (HCC) 02/27/2023   AMS (altered mental status) 01/06/2023   Anemia 08/31/2012   Benign hypertrophy of prostate    Dysphagia 03/01/2023   Hepatitis C    Hypokalemia 02/06/2013   Schizophrenia (HCC)    Swelling of lower extremity 01/13/2023   Urinary tract infection    Urinary tract infection 06/13/2011   IMO SNOMED Dx Update Oct 2024     UTI (urinary tract infection) 06/13/2011   IMO SNOMED Dx Update Oct 2024      Past Surgical History:  Procedure Laterality Date   KNEE SURGERY     right    Current Medications: Current Facility-Administered Medications  Medication Dose Route Frequency Provider Last Rate Last Admin   benztropine (COGENTIN) tablet 0.5 mg  0.5 mg Oral BID PRN Verner Chol, MD       diltiazem (CARDIZEM CD) 24 hr capsule 180 mg  180 mg Oral Daily Mariel Craft, MD   180 mg at 03/20/23 4332   feeding supplement (ENSURE ENLIVE / ENSURE PLUS) liquid 237 mL  237 mL Oral BID BM Mariel Craft, MD   237 mL at 03/20/23 1614   haloperidol (HALDOL) tablet 10 mg  10 mg Oral BID Mariel Craft, MD   10 mg at 03/20/23 9518   haloperidol (HALDOL) tablet 5 mg  5 mg Oral Q8H PRN Mariel Craft, MD       Or   haloperidol lactate (HALDOL) injection 5 mg  5 mg Intramuscular Q8H PRN Mariel Craft, MD       magnesium hydroxide (MILK OF MAGNESIA) suspension 15 mL  15 mL Oral Daily PRN Lewanda Rife, MD       multivitamin with minerals tablet 1 tablet  1 tablet Oral Daily Mariel Craft, MD   1 tablet at 03/20/23 8416   Oral care mouth rinse  15 mL Mouth Rinse PRN Mariel Craft, MD       tamsulosin [facility]) capsule 0.4 mg  0.4 mg Oral Daily Mariel Craft, MD   0.4 mg at 03/20/23 6063   thiamine (VITAMIN B1) tablet 100 mg  100 mg Oral Daily Mariel Craft, MD   100 mg at 03/20/23 0160    Blood Alcohol level:  Lab Results  Component Value Date    ETH <10 02/08/2023   ETH <10 01/06/2023      Psychiatric Specialty Exam:  Presentation  General Appearance:  Fair  Eye Contact:  Fleeting, questionable vision impairment  Speech: Improved, more spontaneous  Speech Volume: Normal    Mood and Affect  Mood: "Fine"  Affect: Less constricted   Thought Process  Thought Processes: Improved  Descriptions of Associations: Intact  Orientation:Partial  Thought Content: Off-and-on paranoia  Hallucinations: Denies  Ideas of Reference:None  Suicidal Thoughts: Denies  Homicidal Thoughts:  Denies  Sensorium  Memory: Immediate Fair; Recent Poor; Remote Poor  Judgment: Improving  Insight: Shallow   Executive Functions  Concentration: Improving  Attention Span: Improving  Recall: Poor  Language: Fair   Psychomotor Activity  Psychomotor Activity: Decreased   Musculoskeletal: Strength & Muscle Tone: within normal limits Gait & Station: patient uses walker Assets  Assets: Manufacturing systems engineer; Physical Health; Resilience    Physical Exam: Physical Exam HENT:     Head: Normocephalic and atraumatic.     Nose: No congestion.  Cardiovascular:     Rate and Rhythm: Regular rhythm.  Pulmonary:     Effort: Pulmonary effort is normal.  Skin:    General: Skin is warm.  Neurological:     Mental Status: He is alert.    Review of Systems  Constitutional:  Negative for chills and fever.  HENT:  Positive for hearing loss. Negative for sore throat.   Respiratory:  Negative for cough and shortness of breath.   Cardiovascular:  Negative for chest pain and palpitations.  Gastrointestinal:  Negative for heartburn and nausea.  Neurological:  Negative for dizziness.  Psychiatric/Behavioral:  Positive for memory loss.    Blood pressure 129/85, pulse 86, temperature 97.9 F (36.6 C), resp. rate 18, height 5\' 8"  (1.727 m), weight 75.5 kg, SpO2 99%. Body mass index is 25.32 kg/m.  Diagnosis:  Principal Problem:   Schizophrenia [facility])   PLAN:Clinical Decision Making: Patient with history of schizoaffective disorder, currently not taking his medications, lacks insight, very paranoid and aggressive towards the staff, disorganized thought process, thinking about the hospital as a jail and demanding for an attorney.  He needs inpatient psychiatric hospitalization for stabilization  Treatment Plan Summary:  Safety and Monitoring:             -- Involuntary admission to inpatient psychiatric unit for safety, stabilization and treatment             -- Daily contact with patient to assess and evaluate symptoms and progress in treatment             -- Patient's case to be discussed in multi-disciplinary team meeting             -- Observation Level: q15 minute checks             -- Vital signs:  q12 hours             -- Precautions: suicide, elopement, and assault   2. Psychiatric Diagnoses and Treatment:               Continue Haldol 10 mg twice daily  Haldol Dec 100 mg IM given 03/10/23 Cogentin 0.5 mg twice daily as needed for EPS               -- Encouraged patient to participate in unit milieu and in scheduled group therapies                            3. Discharge Planning:              -- Social work and case management to assist with discharge planning and identification of hospital follow-up needs prior to discharge                        -- ELOS: 1-2 days             Lewanda Rife, MD

## 2023-03-20 NOTE — Plan of Care (Signed)
 Pt remains 1:1 for safety; pt is med compliant; slept throughout the night uninterrupted. Pt LBM 03/19/23. Pt ate snack and went to sleep. Pt denies SI/HI/AVH. Will continue to monitor.    03/20/23 0100  Psych Admission Type (Psych Patients Only)  Admission Status Involuntary  Psychosocial Assessment  Patient Complaints Irritability  Eye Contact Fair  Facial Expression Flat  Affect Depressed  Speech Loud  Interaction Minimal  Motor Activity Unsteady  Appearance/Hygiene In scrubs  Behavior Characteristics Guarded  Mood Sullen  Thought Process  Coherency WDL  Content WDL  Delusions None reported or observed  Perception WDL  Hallucination None reported or observed  Judgment Impaired  Confusion Mild  Danger to Self  Current suicidal ideation? Denies  Danger to Others  Danger to Others Reported or observed   Problem: Coping: Goal: Ability to identify and develop effective coping behavior will improve Outcome: Progressing   Problem: Self-Concept: Goal: Ability to identify factors that promote anxiety will improve Outcome: Progressing Goal: Level of anxiety will decrease Outcome: Progressing Goal: Ability to modify response to factors that promote anxiety will improve Outcome: Progressing

## 2023-03-20 NOTE — Progress Notes (Incomplete)
   03/20/23 2000  Psych Admission Type (Psych Patients Only)  Admission Status Voluntary  Psychosocial Assessment  Patient Complaints Worrying  Eye Contact Fair  Facial Expression Flat  Affect Irritable  Speech Loud  Interaction Minimal  Motor Activity Unsteady  Appearance/Hygiene In scrubs  Behavior Characteristics Irritable  Mood Angry  Thought Process  Coherency Disorganized  Content Delusions  Delusions Persecutory  Perception UTA  Hallucination None reported or observed  Judgment Impaired  Confusion Moderate  Danger to Self  Current suicidal ideation? Denies  Danger to Others  Danger to Others None reported or observed  Danger to Others Abnormal  Harmful Behavior to others No threats or harm toward other people

## 2023-03-20 NOTE — Group Note (Signed)
 Date:  03/20/2023 Time:  10:26 PM  Group Topic/Focus:  Goals Group:   The focus of this group is to help patients establish daily goals to achieve during treatment and discuss how the patient can incorporate goal setting into their daily lives to aide in recovery.    Participation Level:  Did Not Attend  Participation Quality:   Did Not Attend  Affect:   Did Not Attend  Cognitive:   Did Not Attend  Insight: None  Engagement in Group:  None  Modes of Intervention:  Orientation  Additional Comments:    [name] 03/20/2023, 10:26 PM

## 2023-03-20 NOTE — Plan of Care (Signed)
  Problem: Education: Goal: Ability to state activities that reduce stress will improve Outcome: Not Progressing   Problem: Coping: Goal: Ability to identify and develop effective coping behavior will improve Outcome: Not Progressing   Problem: Self-Concept: Goal: Level of anxiety will decrease Outcome: Not Progressing   

## 2023-03-21 DIAGNOSIS — F200 Paranoid schizophrenia: Secondary | ICD-10-CM | POA: Diagnosis not present

## 2023-03-21 MED ORDER — DILTIAZEM HCL ER COATED BEADS 180 MG PO CP24: 180.0000 mg | ORAL_CAPSULE | Freq: Every day | ORAL | 0 refills | Status: DC

## 2023-03-21 MED ORDER — HALOPERIDOL 10 MG PO TABS: 10.0000 mg | ORAL_TABLET | Freq: Two times a day (BID) | ORAL | 0 refills | Status: DC

## 2023-03-21 MED ORDER — TAMSULOSIN HCL 0.4 MG PO CAPS: 0.4000 mg | ORAL_CAPSULE | Freq: Every day | ORAL | 4 refills | Status: AC

## 2023-03-21 MED ORDER — VITAMIN B-1 100 MG PO TABS: 100.0000 mg | ORAL_TABLET | Freq: Every day | ORAL | 0 refills | Status: AC

## 2023-03-21 MED ORDER — HALOPERIDOL DECANOATE 100 MG/ML IM SOLN: 100.0000 mg | INTRAMUSCULAR | 2 refills | Status: DC

## 2023-03-21 MED ORDER — ADULT MULTIVITAMIN W/MINERALS CH
1.0000 | ORAL_TABLET | Freq: Every day | ORAL | 0 refills | Status: AC
Start: 2023-03-21 — End: ?

## 2023-03-21 NOTE — Care Management Important Message (Signed)
 Important Message  Patient Details  Name: [name] MRN: [id number] Date of Birth: 1940/03/12   Important Message Given:  Yes - Medicare IM     Elza Rafter, LCSWA 03/21/2023, 10:32 AM

## 2023-03-21 NOTE — Plan of Care (Signed)
@  2200 pt became irritable with staff, yelling stating "jesus told me to walk, so I'm walking." Pt remains unsteady on feet and was assisted to the bathroom where he begin to become irritable again making unclear statements about if "I lay down that technology will make me piss on myself." Pt was redirected and assisted to walk the halls. Pt came to the nursing station stating "I have a problem and she going to tell you about it." Pt was referring to the sitter with him. Pt was encouraged to share concerns and yelled "I'm doing jesus will, he told me to walk." Pt was assisted around the unit to walk before returning to his room; eventually going to sleep.  Pt remains on 1:1 for safety.    03/20/23 2012  Charting Type  Charting Type Shift assessment  Safety Check Verification  Has the RN verified the 15 minute safety check completion? Yes  Neurological  Neuro (WDL) X  Orientation Level Oriented to person  HEENT  HEENT (WDL) X  R Eye Impaired vision  L Eye Impaired vision  R Ear Impaired hearing  L Ear Impaired hearing  Teeth Poor dental hygiene  Respiratory  Respiratory (WDL) WDL  Cardiac  Cardiac (WDL) WDL  Vascular  Vascular (WDL) WDL  Integumentary  Integumentary (WDL) WDL  Braden Scale (Ages 8 and up)  Sensory Perceptions 3  Moisture 3  Activity 3  Mobility 3  Nutrition 3  Friction and Shear 3  Braden Scale Score 18  Braden Interventions  Required Braden Scale Bundle Interventions *See Row Information* At Risk for pressure injury (13-18) - At Risk requirements implemented  Musculoskeletal  Musculoskeletal (WDL) WDL  Assistive Device Front wheel walker  Gastrointestinal  Gastrointestinal (WDL) WDL  Last BM Date  03/20/23  GU Assessment  Genitourinary (WDL) X  Genitourinary Symptoms Incontinence  Neurological  Level of Consciousness Alert    Problem: Self-Concept: Goal: Ability to identify factors that promote anxiety will improve Outcome: Progressing Goal: Ability  to modify response to factors that promote anxiety will improve Outcome: Progressing   Problem: Coping: Goal: Ability to identify and develop effective coping behavior will improve Outcome: Not Progressing   Problem: Self-Concept: Goal: Level of anxiety will decrease Outcome: Not Progressing   Problem: Education: Goal: Utilization of techniques to improve thought processes will improve Outcome: Not Progressing

## 2023-03-21 NOTE — Progress Notes (Signed)
  [name] Adult Case Management Discharge Plan :  Will you be returning to the same living situation after discharge:  Yes,  pt will return to his sister's home  At discharge, do you have transportation home?: Yes,  CSW will assist with transport  Do you have the ability to pay for your medications: Yes,  MEDICARE / MEDICARE PART A AND B  Release of information consent forms completed and in the chart;  Patient's signature needed at discharge.  Patient to Follow up at:  Follow-up Information     [name] Follow up on 03/28/2023.   Why: appt is 3/3 at 8:30 via the phone and will receive a call at [phone]. Contact information: [address]  [address] [place] [address] [phone]                 Next level of care provider has access to [name] Link:no  Safety Planning and Suicide Prevention discussed: [name],  [name], sister, [phone]      Has patient been referred to the Quitline?: Patient refused referral for treatment  Patient has been referred for addiction treatment: No known substance use disorder.  [address], LCSWA 03/21/2023, 10:48 AM

## 2023-03-21 NOTE — Plan of Care (Signed)
?  Problem: Coping: ?Goal: Coping ability will improve ?Outcome: Progressing  ?Problem: Health Behavior/Discharge Planning: ?Goal: Compliance with therapeutic regimen will improve ?Outcome: Progressing ?  ?

## 2023-03-21 NOTE — Progress Notes (Signed)
 1:1 Sitter for safety.  Patient cooperative with staff.  Flat affect.  Endorses depression. Denies SI/HI and AVH.  Preoccupied with getting his walking papers and belongings for discharge.  Speech is loud and disorganized.    Compliant with scheduled medications.  15 min checks in place for safety.  Patient is present in the milieu with sitter.  No interaction with peers.    Plan is for patient to discharge today.

## 2023-03-21 NOTE — Discharge Summary (Signed)
 Physician Discharge Summary Note  Patient:  [name] is an 83 y.o., male MRN:  [id number] DOB:  [birth year] Patient phone:  [phone] (home)  Patient address:   [address] [place] [facility] [address],    Date of Admission:  03/10/2023 Date of Discharge: 03/21/23  Reason for Admission:  [name] is a 83 y.o. male admitted: Medically for 02/27/2023  9:59 AM for acute kidney injury, altered mental status. He carries the psychiatric diagnoses of schizophrenia and has a past medical history of protein-calorie malnutrition, neurosyphilis, hearing loss, hepatitis C, anemia, BPH, and repeated urinary tract infections in a male. His current presentation of altered mental status is most consistent with schizoaffective disorder with medication non-adherence. He meets criteria for schizophrenia based on historical chart data, current disorganization   Principal Problem: Schizophrenia [facility]) Discharge Diagnoses: Principal Problem:   Schizophrenia (HCC)   Past Psychiatric History: see h&p  Family Psychiatric  History: see h&p Social History:  Social History   Substance and Sexual Activity  Alcohol Use No     Social History   Substance and Sexual Activity  Drug Use No    Social History   Socioeconomic History   Marital status: Single    Spouse name: Not on file   Number of children: Not on file   Years of education: Not on file   Highest education level: Not on file  Occupational History   Not on file  Tobacco Use   Smoking status: Former    Current packs/day: 0.00    Average packs/day: 1 pack/day for 20.0 years (20.0 ttl pk-yrs)    Types: Cigarettes    Start date: 06/11/1955    Quit date: 06/11/1975    Years since quitting: 47.8   Smokeless tobacco: Never  Vaping Use   Vaping status: Never Used  Substance and Sexual Activity   Alcohol use: No   Drug use: No   Sexual activity: Not on file  Other Topics Concern   Not on file  Social History Narrative   Not on  file   Social Drivers of Health   Financial Resource Strain: Not on file  Food Insecurity: Patient Declined (03/10/2023)   Hunger Vital Sign    Worried About Running Out of Food in the Last Year: Patient declined    Ran Out of Food in the Last Year: Patient declined  Recent Concern: Food Insecurity - Food Insecurity Present (02/28/2023)   Hunger Vital Sign    Worried About Running Out of Food in the Last Year: Often true    Ran Out of Food in the Last Year: Often true  Transportation Needs: Patient Declined (03/10/2023)   PRAPARE - Administrator, Civil Service (Medical): Patient declined    Lack of Transportation (Non-Medical): Patient declined  Physical Activity: Not on file  Stress: Not on file  Social Connections: Patient Declined (03/10/2023)   Social Connection and Isolation Panel [NHANES]    Frequency of Communication with Friends and Family: Patient declined    Frequency of Social Gatherings with Friends and Family: Patient declined    Attends Religious Services: Patient declined    Active Member of Clubs or Organizations: Patient declined    Attends Banker Meetings: Patient declined    Marital Status: Patient declined   Past Medical History:  Past Medical History:  Diagnosis Date   Acute cystitis without hematuria 02/28/2023   AKI (acute kidney injury) (HCC) 02/27/2023   AMS (altered mental status) 01/06/2023  Anemia 08/31/2012   Benign hypertrophy of prostate    Dysphagia 03/01/2023   Hepatitis C    Hypokalemia 02/06/2013   Schizophrenia (HCC)    Swelling of lower extremity 01/13/2023   Urinary tract infection    Urinary tract infection 06/13/2011   IMO SNOMED Dx Update Oct 2024     UTI (urinary tract infection) 06/13/2011   IMO SNOMED Dx Update Oct 2024      Past Surgical History:  Procedure Laterality Date   KNEE SURGERY     right   Family History: History reviewed. No pertinent family history.  Hospital Course:  [name] is  a 83 y.o. male admitted: Medically for 02/27/2023  9:59 AM for acute kidney injury, altered mental status. He carries the psychiatric diagnoses of schizophrenia and has a past medical history of protein-calorie malnutrition, neurosyphilis, hearing loss, hepatitis C, anemia, BPH, and repeated urinary tract infections in a male. His current presentation of altered mental status is most consistent with schizoaffective disorder with medication non-adherence. He meets criteria for schizophrenia based on historical chart data, current disorganization .  Patient was admitted to the geropsych unit for stabilization.  Multidisciplinary treatment team approach was offered.  Medication management, milieu/group therapy was offered.  It was documented that patient received Haldol Decanoate 100 mg on 03/10/2023 before the admission.  Patient also was on Haldol 10 mg twice daily to help with his residual symptoms of psychosis.  During the hospital stay patient is noted to have unsteady gait and seems to have visual impairment.  He was given one-to-one sitter throughout the hospital stay for safety.  Patient consistently denied auditory/visual hallucinations and consistently denied suicidal/homicidal ideation/intent/plan.  He maintained his safety on the unit and had hard time participating in the groups as he has hard of hearing and visual impairment.  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  On the day of discharge patient consistently denied SI/HI/intent/plan, denied auditory/visual hallucinations and is not responding to any internal stimuli.  He is taking his medications with no problems and following directions.  His sister agreed to take him home as she has been receiving his [facility] checks all this time. Physical Findings: AIMS:  , ,  ,  ,    CIWA:    COWS:        Psychiatric Specialty Exam:  Presentation  General Appearance:  Appropriate for Environment  Eye Contact: Fair  Speech: Normal  Rate  Speech Volume: Decreased    Mood and Affect  Mood: Euthymic  Affect: Flat   Thought Process  Thought Processes: Linear  Descriptions of Associations:Intact  Orientation:Partial  Thought Content:Illogical  Hallucinations:Hallucinations: None  Ideas of Reference:Paranoia  Suicidal Thoughts:Suicidal Thoughts: No  Homicidal Thoughts:Homicidal Thoughts: No   Sensorium  Memory: Immediate Fair; Recent Fair; Remote Poor  Judgment: Intact  Insight: Present   Executive Functions  Concentration: Fair  Attention Span: Fair  Recall: [employer] of Knowledge: Fair  Language: Fair   Psychomotor Activity  Psychomotor Activity: Psychomotor Activity: Normal  Musculoskeletal: Strength & Muscle Tone: decreased Gait & Station: unsteady Assets  Assets: Manufacturing systems engineer; Desire for Improvement; Resilience   Sleep  Sleep: Sleep: Fair    Physical Exam: Physical Exam Vitals and nursing note reviewed.  HENT:     Head: Normocephalic.     Nose: Nose normal.     Mouth/Throat:     Mouth: Mucous membranes are moist.  Cardiovascular:     Rate and Rhythm: Normal rate.  Pulses: Normal pulses.  Pulmonary:     Breath sounds: Normal breath sounds.  Abdominal:     General: Bowel sounds are normal.  Neurological:     General: No focal deficit present.     Mental Status: He is alert.    Review of Systems  Constitutional: Negative.   Eyes: Negative.   Cardiovascular: Negative.   Gastrointestinal: Negative.   Skin: Negative.    Blood pressure (!) 156/98, pulse (!) 51, temperature 98.2 F (36.8 C), resp. rate 18, height 5\' 8"  (1.727 m), weight 75.5 kg, SpO2 100%. Body mass index is 25.32 kg/m.   Social History   Tobacco Use  Smoking Status Former   Current packs/day: 0.00   Average packs/day: 1 pack/day for 20.0 years (20.0 ttl pk-yrs)   Types: Cigarettes   Start date: 06/11/1955   Quit date: 06/11/1975   Years since quitting:  47.8  Smokeless Tobacco Never   Tobacco Cessation:  A prescription for an FDA-approved tobacco cessation medication was offered at discharge and the patient refused   Blood Alcohol level:  Lab Results  Component Value Date   ETH <10 02/08/2023   ETH <10 01/06/2023    Metabolic Disorder Labs:  No results found for: "HGBA1C", "MPG" No results found for: "PROLACTIN" No results found for: "CHOL", "TRIG", "HDL", "CHOLHDL", "VLDL", "LDLCALC"  See Psychiatric Specialty Exam and Suicide Risk Assessment completed by Attending Physician prior to discharge.  Discharge destination:  Home  Is patient on multiple antipsychotic therapies at discharge:  No   Has Patient had three or more failed trials of antipsychotic monotherapy by history:  No  Recommended Plan for Multiple Antipsychotic Therapies: NA  Discharge Instructions     Diet - low sodium heart healthy   Complete by: As directed    Increase activity slowly   Complete by: As directed       Allergies as of 03/21/2023   No Known Allergies      Medication List     STOP taking these medications    acetaminophen 325 MG tablet Commonly known as: TYLENOL   acetaminophen 650 MG suppository Commonly known as: TYLENOL   mouth rinse Liqd solution       TAKE these medications      Indication  diltiazem 180 MG 24 hr capsule Commonly known as: CARDIZEM CD Take 1 capsule (180 mg total) by mouth daily.    feeding supplement Liqd Take 237 mLs by mouth 2 (two) times daily between meals.    haloperidol 10 MG tablet Commonly known as: HALDOL Take 1 tablet (10 mg total) by mouth 2 (two) times daily.    haloperidol decanoate 100 MG/ML injection Commonly known as: HALDOL DECANOATE Inject 1 mL (100 mg total) into the muscle once a week. Pt received 1st IM injection on 2/13, needs to receive next on 04/07/23 IM injection What changed: additional instructions  Indication: Schizophrenia   multivitamin with minerals Tabs  tablet Take 1 tablet by mouth daily.    tamsulosin 0.4 MG Caps capsule Commonly known as: FLOMAX Take 1 capsule (0.4 mg total) by mouth daily.    thiamine 100 MG tablet Commonly known as: Vitamin B-1 Take 1 tablet (100 mg total) by mouth daily.         Follow-up Information     [facility] Follow up on 03/28/2023.   Why: appt is 3/3 at 8:30 via the phone and will receive a call at [phone]. Contact information: [address]  [address] [place]  [address] [phone]                 Follow-up recommendations:  Activity:  as tolerated    Signed: Verner Chol, MD 03/21/2023, 11:53 AM

## 2023-03-21 NOTE — Group Note (Signed)
 Recreation Therapy Group Note   Group Topic:General Recreation  Group Date: 03/21/2023 Start Time: 1400 End Time: 1500 Facilitators: Rosina Lowenstein, LRT, CTRS Location: Courtyard  Group Description: Outdoor Recreation. Patients had the option to play corn hole, ring toss, bowling or listening to music while outside in the courtyard getting fresh air and sunlight. LRT and patients discussed things that they enjoy doing in their free time outside of the hospital. LRT encouraged patients to drink water after being active and getting their heart rate up.   Goal Area(s) Addressed: Patient will identify leisure interests.  Patient will practice healthy decision making. Patient will engage in recreation activity.   Affect/Mood: N/A   Participation Level: Did not attend    Clinical Observations/Individualized Feedback: Patient did not attend group.   Plan: Continue to engage patient in RT group sessions 2-3x/week.   Rosina Lowenstein, LRT, CTRS 03/21/2023 4:43 PM

## 2023-03-21 NOTE — Progress Notes (Signed)
 Discharge Note:  Patient denies SI/HI/AVH at this time. Discharge instructions, AVS, prescriptions, and transition record reviewed with patient. Patient agrees to comply with medication management, follow-up visit, and outpatient therapy. Patient belongings returned to patient. Questions and concerns addressed and answered. Patient ambulatory off unit. Patient discharged to home to sister via Psychologist, educational.    Patient was unable to complete Suicide Safety Plan or patient survey due to confusion.

## 2023-03-21 NOTE — BHH Suicide Risk Assessment (Signed)
 [facility] Discharge Suicide Risk Assessment   Principal Problem: Schizophrenia [facility]) Discharge Diagnoses: Principal Problem:   Schizophrenia (HCC)   Total Time spent with patient: 30 minutes  Musculoskeletal: Strength & Muscle Tone: decreased Gait & Station: unsteady Patient leans: N/A  Psychiatric Specialty Exam  Presentation  General Appearance:  Appropriate for Environment; Bizarre  Eye Contact: Fleeting  Speech: Blocked  Speech Volume: Decreased  Handedness: Right   Mood and Affect  Mood: Depressed  Duration of Depression Symptoms: No data recorded Affect: Flat; Restricted   Thought Process  Thought Processes: Disorganized  Descriptions of Associations:Tangential  Orientation:Partial  Thought Content:Illogical; Tangential  History of Schizophrenia/Schizoaffective disorder:Yes  Duration of Psychotic Symptoms:Greater than six months  Hallucinations:No data recorded Ideas of Reference:None  Suicidal Thoughts:No data recorded Homicidal Thoughts:No data recorded  Sensorium  Memory: Immediate Fair; Recent Poor; Remote Poor  Judgment: Impaired  Insight: Shallow   Executive Functions  Concentration: Poor  Attention Span: Poor  Recall: Poor  Fund of Knowledge: Fair  Language: Fair   Psychomotor Activity  Psychomotor Activity:No data recorded  Assets  Assets: Communication Skills; Physical Health; Resilience   Sleep  Sleep:No data recorded  Physical Exam: Physical Exam ROS Blood pressure (!) 156/98, pulse (!) 51, temperature 98.2 F (36.8 C), resp. rate 18, height 5\' 8"  (1.727 m), weight 75.5 kg, SpO2 100%. Body mass index is 25.32 kg/m.  Mental Status Per Nursing Assessment::   On Admission:  NA  Demographic Factors:  Male, Age 11 or older, and Low socioeconomic status  Loss Factors: Decline in physical health  Historical Factors: Impulsivity  Risk Reduction Factors:   Living with another person, especially  a relative, Positive therapeutic relationship, and Positive coping skills or problem solving skills  Continued Clinical Symptoms:  Schizophrenia:   Paranoid or undifferentiated type  Cognitive Features That Contribute To Risk:  Thought constriction (tunnel vision)    Suicide Risk:  Minimal: No identifiable suicidal ideation.  Patients presenting with no risk factors but with morbid ruminations; may be classified as minimal risk based on the severity of the depressive symptoms   Follow-up Information     [facility] Follow up on 03/28/2023.   Why: appt is 3/3 at 8:30 via the phone and will receive a call at [phone]. Contact information: [address]  [address] [place] [address] [phone]                 Plan Of Care/Follow-up recommendations:  Activity:  AS tolerated  Verner Chol, MD 03/21/2023, 11:51 AM

## 2023-03-23 ENCOUNTER — Ambulatory Visit: Payer: Self-pay | Admitting: Licensed Clinical Social Worker

## 2023-03-23 NOTE — Patient Outreach (Signed)
 Care Coordination   Follow Up Visit Note   03/23/2023 Name: [name] MRN: [id number] DOB: Nov 11, 1940  [name] is a 83 y.o. year old male who sees [facility]. I spoke with  Baldwin Crown sister [name] by phone today.  What matters to the patients health and wellness today?  Ms. Lia Foyer reports pt is currently in [facility]     Goals Addressed             This Visit's Progress    COMPLETED: Care Coordination       Activities and task to complete in order to accomplish goals.   Complete SCAT application LCSW A. Brileigh Sevcik compted application  Continue with compliance of taking medication prescribed by Doctor  Referral place with pharmacy to assist to medication cost concerns.  Collaborate with [facility]CP and [facility] regarding concerns with hearing and pertinent medical questions.  Contact [facility] regarding assistance with PCA services [phone].          SDOH assessments and interventions completed:  No     Care Coordination Interventions:  No, not indicated   Follow up plan: No further intervention required.   Encounter Outcome:  Patient Visit Completed   Gwyndolyn Saxon MSW, LCSW Licensed Clinical Social Worker  [facility], Population Health Direct Dial: [phone]  Fax: [phone]

## 2023-03-23 NOTE — Patient Instructions (Signed)
 Visit Information  Thank you for taking time to visit with me today. Please don't hesitate to contact me if I can be of assistance to you.   Following are the goals we discussed today:   Goals Addressed             This Visit's Progress    COMPLETED: Care Coordination       Activities and task to complete in order to accomplish goals.   Complete SCAT application LCSW A. Luisalberto Beegle compted application  Continue with compliance of taking medication prescribed by Doctor  Referral place with pharmacy to assist to medication cost concerns.  [facility]ate with [facility] and [facility] regarding concerns with hearing and pertinent medical questions.  Contact [facility] regarding assistance with PCA services [phone].           Please call the care guide team at [phone] if you need to cancel or reschedule your appointment.   If you are experiencing a Mental Health or Behavioral Health Crisis or need someone to talk to, please go to [facility] [address], [place] [phone]) call 911   Patient verbalizes understanding of instructions and care plan provided today and agrees to view in MyChart. Active MyChart status and patient understanding of how to access instructions and care plan via MyChart confirmed with patient.     No further follow up required: [name] is an active [facility].   Gwyndolyn Saxon MSW, LCSW Licensed Clinical Social Worker  [facility], Population Health Direct Dial: [phone]  Fax: [phone]

## 2023-04-01 NOTE — Telephone Encounter (Signed)
 Attempted to reach patient - phone remains no answer and "no voice mail set-up"  Contacted sister, She shared that the patient was currently admitted at the [facility]  She states he missed a PCP appointment in late February to establish there.    I am happy to reengage when appropriate / requested.   I anticipate medication through the [name] system will likely be ideal for this patient.

## 2023-04-04 NOTE — Telephone Encounter (Signed)
 Reviewed and agree with Dr Macky Lower plan.

## 2023-06-03 ENCOUNTER — Other Ambulatory Visit: Payer: Self-pay

## 2023-06-03 ENCOUNTER — Emergency Department (HOSPITAL_COMMUNITY)
Admission: EM | Admit: 2023-06-03 | Discharge: 2023-06-04 | Disposition: A | Discharge status: Home / Self Care | Attending: Emergency Medicine | Admitting: Emergency Medicine

## 2023-06-03 ENCOUNTER — Emergency Department (HOSPITAL_COMMUNITY)

## 2023-06-03 DIAGNOSIS — R55 Syncope and collapse: Secondary | ICD-10-CM | POA: Diagnosis present

## 2023-06-03 DIAGNOSIS — F209 Schizophrenia, unspecified: Secondary | ICD-10-CM | POA: Diagnosis not present

## 2023-06-03 DIAGNOSIS — I471 Supraventricular tachycardia, unspecified: Secondary | ICD-10-CM | POA: Insufficient documentation

## 2023-06-03 LAB — URINALYSIS, ROUTINE W REFLEX MICROSCOPIC
Bilirubin Urine: NEGATIVE
Glucose, UA: NEGATIVE mg/dL
Ketones, ur: NEGATIVE mg/dL
Nitrite: NEGATIVE
Protein, ur: 100 mg/dL — AB
Specific Gravity, Urine: 1.019 (ref 1.005–1.030)
WBC, UA: >50 WBC/hpf (ref 0–5)
pH: 5 (ref 5.0–8.0)

## 2023-06-03 LAB — CBC WITH DIFFERENTIAL/PLATELET
Abs Immature Granulocytes: 0.02 10*3/uL (ref 0.00–0.07)
Basophils Absolute: 0 10*3/uL (ref 0.0–0.1)
Basophils Relative: 1 %
Eosinophils Absolute: 0 10*3/uL (ref 0.0–0.5)
Eosinophils Relative: 0 %
HCT: 41.7 % (ref 39.0–52.0)
Hemoglobin: 13.8 g/dL (ref 13.0–17.0)
Immature Granulocytes: 0 %
Lymphocytes Relative: 16 %
Lymphs Abs: 0.9 10*3/uL (ref 0.7–4.0)
MCH: 29.1 pg (ref 26.0–34.0)
MCHC: 33.1 g/dL (ref 30.0–36.0)
MCV: 87.8 fL (ref 80.0–100.0)
Monocytes Absolute: 0.5 10*3/uL (ref 0.1–1.0)
Monocytes Relative: 9 %
Neutro Abs: 4 10*3/uL (ref 1.7–7.7)
Neutrophils Relative %: 74 %
Platelets: 187 10*3/uL (ref 150–400)
RBC: 4.75 MIL/uL (ref 4.22–5.81)
RDW: 14.1 % (ref 11.5–15.5)
WBC: 5.4 10*3/uL (ref 4.0–10.5)
nRBC: 0 % (ref 0.0–0.2)

## 2023-06-03 LAB — COMPREHENSIVE METABOLIC PANEL WITH GFR
ALT: 13 U/L (ref 0–44)
AST: 21 U/L (ref 15–41)
Albumin: 3.1 g/dL — ABNORMAL LOW (ref 3.5–5.0)
Alkaline Phosphatase: 127 U/L — ABNORMAL HIGH (ref 38–126)
Anion gap: 8 (ref 5–15)
BUN: 7 mg/dL — ABNORMAL LOW (ref 8–23)
CO2: 27 mmol/L (ref 22–32)
Calcium: 9.1 mg/dL (ref 8.9–10.3)
Chloride: 108 mmol/L (ref 98–111)
Creatinine, Ser: 1.35 mg/dL — ABNORMAL HIGH (ref 0.61–1.24)
GFR, Estimated: 53 mL/min — ABNORMAL LOW (ref >60)
Glucose, Bld: 102 mg/dL — ABNORMAL HIGH (ref 70–99)
Potassium: 3.5 mmol/L (ref 3.5–5.1)
Sodium: 143 mmol/L (ref 135–145)
Total Bilirubin: 2.1 mg/dL — ABNORMAL HIGH (ref 0.0–1.2)
Total Protein: 7.4 g/dL (ref 6.5–8.1)

## 2023-06-03 LAB — MAGNESIUM: Magnesium: 2 mg/dL (ref 1.7–2.4)

## 2023-06-03 LAB — TROPONIN I (HIGH SENSITIVITY)
Troponin I (High Sensitivity): 4 ng/L (ref <18)
Troponin I (High Sensitivity): 4 ng/L (ref <18)

## 2023-06-03 MED ORDER — METOPROLOL TARTRATE 25 MG PO TABS
12.5000 mg | ORAL_TABLET | Freq: Once | ORAL | Status: AC
Start: 2023-06-03 — End: 2023-06-03
  Administered 2023-06-03: 12.5 mg via ORAL
  Filled 2023-06-03: qty 1

## 2023-06-03 MED ORDER — CARVEDILOL 3.125 MG PO TABS
3.1250 mg | ORAL_TABLET | Freq: Two times a day (BID) | ORAL | 0 refills | Status: AC
  Filled 2023-06-03: qty 60, 30d supply, fill #0

## 2023-06-03 MED ORDER — POTASSIUM CHLORIDE CRYS ER 20 MEQ PO TBCR
40.0000 meq | EXTENDED_RELEASE_TABLET | Freq: Once | ORAL | Status: AC
  Administered 2023-06-03: 40 meq via ORAL
  Filled 2023-06-03: qty 2

## 2023-06-03 MED ORDER — LACTATED RINGERS IV BOLUS
500.0000 mL | Freq: Once | INTRAVENOUS | Status: AC
Start: 2023-06-03 — End: 2023-06-03
  Administered 2023-06-03: 500 mL via INTRAVENOUS

## 2023-06-03 NOTE — Discharge Instructions (Signed)
 1.  Continue to take your regular medications.  You should be taking your Cardizem  .   I have added carvedilol (Coreg) as a new medication.  This medication has been prescribed to help control your heart rate and prevent you from getting episodes of supraventricular tachycardia.  Description of this condition is included in your discharge instructions.  You should fill this prescription and start taking twice daily.  You will need close follow-up.  Make a follow-up appoint with your family doctor.  You should also have a follow-up with cardiology.  Contact information is included in your discharge instructions.

## 2023-06-03 NOTE — ED Notes (Signed)
 ED Provider at bedside.

## 2023-06-03 NOTE — ED Provider Notes (Signed)
  EMERGENCY DEPARTMENT AT [facility] Provider Note   CSN: [id number] Arrival date & time: 06/03/23  1749     History  Chief Complaint  Patient presents with   Near Syncope    [name] is a 83 y.o. male.  HPI Patient has written out a paragraph description of what occurred prior to his episode.  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  He reports that the cashier told him that she could not catch a check and he had to leave but reports that she The check.  Apparently police became involved in the police told him he could not go back to that place but took the report.  He then described in his writings being psychologically restrained.  And thusly being made to pass out.  EMS report that the patient was outside and bystanders called because he was in a park sitting in the mulch.  Patient described feeling lightheaded and feeling like he would pass out.  EMS obtained rhythm strip that showed SVT and administered 6 mg of adenosine with conversion to normal sinus rhythm.  Patient denies he had been experiencing any chest pain or shortness of breath.  Patient reports compliance with his medications.    Home Medications Prior to Admission medications   Medication Sig Start Date End Date Taking? Authorizing Provider  carvedilol (COREG) 3.125 MG tablet Take 1 tablet (3.125 mg total) by mouth 2 (two) times daily with a meal. 06/03/23  Yes Christphor Groft, Bufford Carne, MD  diltiazem  (CARDIZEM  CD) 180 MG 24 hr capsule Take 1 capsule (180 mg total) by mouth daily. 03/21/23   Jadapalle, Sree, MD  feeding supplement (ENSURE ENLIVE / ENSURE PLUS) LIQD Take 237 mLs by mouth 2 (two) times daily between meals. 03/09/23   Gomes, Adriana, DO  haloperidol  (HALDOL ) 10 MG tablet Take 1 tablet (10 mg total) by mouth 2 (two) times daily. 03/21/23   Jadapalle, Sree, MD  haloperidol  decanoate (HALDOL  DECANOATE) 100 MG/ML injection  Inject 1 mL (100 mg total) into the muscle once a week. Pt received 1st IM injection on 2/13, needs to receive next on 04/07/23 IM injection 03/21/23 04/20/23  Jadapalle, Sree, MD  Multiple Vitamin (MULTIVITAMIN WITH MINERALS) TABS tablet Take 1 tablet by mouth daily. 03/21/23   Jadapalle, Sree, MD  tamsulosin  (FLOMAX ) 0.4 MG CAPS capsule Take 1 capsule (0.4 mg total) by mouth daily. 03/21/23   Jadapalle, Sree, MD  thiamine  (VITAMIN B-1) 100 MG tablet Take 1 tablet (100 mg total) by mouth daily. 03/21/23   Jadapalle, Sree, MD      Allergies    Patient has no known allergies.    Review of Systems   Review of Systems  Physical Exam Updated Vital Signs BP 131/87 (BP Location: Right Arm)   Pulse (!) 107   Temp 98 F (36.7 C) (Oral)   Resp 19   Ht 5\' 8"  (1.727 m)   Wt 75 kg   SpO2 90%   BMI 25.14 kg/m  Physical Exam Constitutional:      Comments: Alert nontoxic.  Speech is somewhat tangential and content is variably including some paranoid conversation.  No respiratory distress.  HENT:     Mouth/Throat:     Pharynx: Oropharynx is clear.  Eyes:     Extraocular Movements: Extraocular movements intact.  Cardiovascular:     Rate and Rhythm: Normal rate and regular rhythm.  Pulmonary:  Effort: Pulmonary effort is normal.     Breath sounds: Normal breath sounds.  Abdominal:     General: There is no distension.     Palpations: Abdomen is soft.     Tenderness: There is no abdominal tenderness. There is no guarding.  Musculoskeletal:        General: Normal range of motion.     Right lower leg: No edema.     Left lower leg: No edema.  Skin:    General: Skin is warm and dry.  Neurological:     General: No focal deficit present.     Motor: No weakness.     Coordination: Coordination normal.  Psychiatric:     Comments: Patient is calm and directable.  His interactions are situationally appropriate.  He does have commentary and notations in his book that suggest mild paranoia.      ED Results / Procedures / Treatments   Labs (all labs ordered are listed, but only abnormal results are displayed) Labs Reviewed  COMPREHENSIVE METABOLIC PANEL WITH GFR - Abnormal; Notable for the following components:      Result Value   Glucose, Bld 102 (*)    BUN 7 (*)    Creatinine, Ser 1.35 (*)    Albumin 3.1 (*)    Alkaline Phosphatase 127 (*)    Total Bilirubin 2.1 (*)    GFR, Estimated 53 (*)    All other components within normal limits  CBC WITH DIFFERENTIAL/PLATELET  MAGNESIUM   URINALYSIS, ROUTINE W REFLEX MICROSCOPIC  TROPONIN I (HIGH SENSITIVITY)  TROPONIN I (HIGH SENSITIVITY)    EKG EKG Interpretation Date/Time:  Friday Jun 03 2023 17:51:59 EDT Ventricular Rate:  87 PR Interval:  184 QRS Duration:  82 QT Interval:  350 QTC Calculation: 421 R Axis:   10  Text Interpretation: Sinus rhythm Probable left atrial enlargement ST elevation, consider anterior injury agree, ST elevation seen on older tracings Confirmed by Wynetta Heckle [phone]) on 06/03/2023 5:57:27 PM  Radiology DG Chest Port 1 View Result Date: 06/03/2023 CLINICAL DATA:  Syncope. EXAM: PORTABLE CHEST 1 VIEW COMPARISON:  January 06, 2023 FINDINGS: The heart size and mediastinal contours are within normal limits. The lungs are hyperinflated. There is no evidence of an acute infiltrate, pleural effusion or pneumothorax. A small to moderate sized hiatal hernia is seen. Chronic right rib fractures are noted. Multilevel degenerative changes are seen throughout the thoracic spine. IMPRESSION: 1. COPD without acute or active cardiopulmonary disease. 2. Small to moderate sized hiatal hernia. Electronically Signed   By: Virgle Grime M.D.   On: 06/03/2023 20:55    Procedures Procedures    Medications Ordered in ED Medications  lactated ringers bolus 500 mL (0 mLs Intravenous Stopped 06/03/23 2210)  potassium chloride  SA (KLOR-CON  M) CR tablet 40 mEq (40 mEq Oral Given 06/03/23 2139)  metoprolol  tartrate (LOPRESSOR) tablet 12.5 mg (12.5 mg Oral Given 06/03/23 2324)    ED Course/ Medical Decision Making/ A&P                                 Medical Decision Making Amount and/or Complexity of Data Reviewed Labs: ordered. Radiology: ordered.  Risk Prescription drug management.   Patient is brought by EMS as outlined.  There is documented SVT strips from EMS.  6 mg of adenosine patient converted to normal sinus rhythm.  At this time he has no complaints.  Vital signs are stable.  Will proceed with basic lab work and troponin.  Troponin 4.  Magnesium  2.0.  Potassium 3.5.  GFR 53.  CBC normal with normal differential.  Dust x-ray interpreted by radiology no acute findings.  Stable hiatal hernia  After diagnostic workup.  Patient is clinically well in appearance.  Vital signs are stable.  He is normotensive.  Patient episode of SVT that responded to adenosine.  At this time we will add Coreg to the patient's medications (I felt that low-dose metoprolol would be too difficult for the patient to break a pill twice a day and maintain compliance).  He does have Cardizem  prescribed daily.  I have reinforced the importance of compliance of the medications.  Patient voices understanding and states that he is compliant with medications.  I have also advised he needs close follow-up with his family doctor.  I have provided contact information for [facility] cardiology for follow-up as well.         Final Clinical Impression(s) / ED Diagnoses Final diagnoses:  SVT (supraventricular tachycardia) (HCC)  Schizophrenia, unspecified type (HCC)    Rx / DC Orders ED Discharge Orders          Ordered    carvedilol (COREG) 3.125 MG tablet  2 times daily with meals        06/03/23 2310              Wynetta Heckle, MD 06/03/23 2350

## 2023-06-03 NOTE — ED Notes (Signed)
 Pt reminded of need for UA, states they will attempt to pee at this time via urinal. Call light within reach.

## 2023-06-03 NOTE — ED Notes (Signed)
 Called pt's sister, Wayman Hai, at [phone]. NA, left VM. Called niece, Kathrynn Park, at [phone]. Niece confirms pt lives with Adriana Hopping, but Adriana Hopping is likely asleep at this time. [facility] is unable to pick up the pt for safe transport home. Pt states they do not have any keys to get in the house.

## 2023-06-03 NOTE — ED Notes (Signed)
 Pt provided urinal for specimen collection. Pt states if they need to urinate, they will pee. This RN reinforced need for UA for testing. Call light within reach. Pt remains on cardiac monitor, door closed to maintain privacy. RR even and nonlabored.

## 2023-06-03 NOTE — ED Triage Notes (Signed)
 Pt BIBA from park w/ by standers stating he was sitting in the mulch @ the park. EMS arrived on scene and pt stated that he was going to "fallout again." Pt had near syncope episode while sitting and showed SVT on the monitor. In the ambulance EMS gave 6mg  of adenosine and pt converted into NSR. Pt is A&O x4. No dizziness, SOB, or chest pain. GCS 15

## 2023-06-04 ENCOUNTER — Other Ambulatory Visit (HOSPITAL_COMMUNITY): Payer: Self-pay

## 2023-06-04 NOTE — ED Notes (Signed)
 Rec'ed patient no acute distress noted. Patient sitting up in stretcher reading paperwork. No complaints of pain or discomfort. Patient unable to get into house/or safe ride home. Family at this time is not able to pick patient up. Safety and comfort maintained blankets provided. Call bell within reach, siderails up x2

## 2023-06-04 NOTE — ED Notes (Signed)
 Attempted to call sister again, no answer. At this time, patient agitated and wanting to leave. Patient walked out by self with coreg prescription and printed AVS. No IV in place. Attempted to get patient to stay and wait until there was a plan for safe discharge, but patient became more agitated and insisted on leaving.

## 2023-06-04 NOTE — ED Notes (Signed)
 Attempted to call sister and niece, no answer at either number

## 2024-02-13 ENCOUNTER — Emergency Department (HOSPITAL_COMMUNITY)

## 2024-02-13 ENCOUNTER — Encounter (HOSPITAL_COMMUNITY): Payer: Self-pay

## 2024-02-13 ENCOUNTER — Other Ambulatory Visit: Payer: Self-pay

## 2024-02-13 ENCOUNTER — Inpatient Hospital Stay (HOSPITAL_COMMUNITY)
Admission: EM | Admit: 2024-02-13 | Discharge: 2024-02-14 | DRG: 312 | Discharge status: Against Medical Advice | Attending: Family Medicine | Admitting: Family Medicine

## 2024-02-13 DIAGNOSIS — J90 Pleural effusion, not elsewhere classified: Secondary | ICD-10-CM | POA: Diagnosis present

## 2024-02-13 DIAGNOSIS — N390 Urinary tract infection, site not specified: Secondary | ICD-10-CM | POA: Diagnosis present

## 2024-02-13 DIAGNOSIS — Z1152 Encounter for screening for COVID-19: Secondary | ICD-10-CM | POA: Diagnosis not present

## 2024-02-13 DIAGNOSIS — W44F3XA Food entering into or through a natural orifice, initial encounter: Secondary | ICD-10-CM | POA: Diagnosis present

## 2024-02-13 DIAGNOSIS — R079 Chest pain, unspecified: Secondary | ICD-10-CM | POA: Diagnosis present

## 2024-02-13 DIAGNOSIS — R55 Syncope and collapse: Principal | ICD-10-CM | POA: Diagnosis present

## 2024-02-13 DIAGNOSIS — R1013 Epigastric pain: Secondary | ICD-10-CM | POA: Diagnosis present

## 2024-02-13 DIAGNOSIS — I10 Essential (primary) hypertension: Secondary | ICD-10-CM | POA: Diagnosis present

## 2024-02-13 DIAGNOSIS — F209 Schizophrenia, unspecified: Secondary | ICD-10-CM | POA: Diagnosis present

## 2024-02-13 DIAGNOSIS — Z7982 Long term (current) use of aspirin: Secondary | ICD-10-CM | POA: Diagnosis not present

## 2024-02-13 DIAGNOSIS — H9190 Unspecified hearing loss, unspecified ear: Secondary | ICD-10-CM | POA: Diagnosis present

## 2024-02-13 DIAGNOSIS — T17828A Food in other parts of respiratory tract causing other injury, initial encounter: Principal | ICD-10-CM | POA: Diagnosis present

## 2024-02-13 DIAGNOSIS — Z79899 Other long term (current) drug therapy: Secondary | ICD-10-CM

## 2024-02-13 DIAGNOSIS — N179 Acute kidney failure, unspecified: Secondary | ICD-10-CM | POA: Diagnosis present

## 2024-02-13 DIAGNOSIS — Z5900 Homelessness unspecified: Secondary | ICD-10-CM

## 2024-02-13 DIAGNOSIS — R0789 Other chest pain: Secondary | ICD-10-CM | POA: Diagnosis present

## 2024-02-13 DIAGNOSIS — R1310 Dysphagia, unspecified: Secondary | ICD-10-CM | POA: Diagnosis present

## 2024-02-13 DIAGNOSIS — F419 Anxiety disorder, unspecified: Secondary | ICD-10-CM | POA: Diagnosis present

## 2024-02-13 DIAGNOSIS — F32A Depression, unspecified: Secondary | ICD-10-CM | POA: Diagnosis present

## 2024-02-13 DIAGNOSIS — N400 Enlarged prostate without lower urinary tract symptoms: Secondary | ICD-10-CM | POA: Diagnosis present

## 2024-02-13 DIAGNOSIS — R7989 Other specified abnormal findings of blood chemistry: Secondary | ICD-10-CM | POA: Insufficient documentation

## 2024-02-13 DIAGNOSIS — Z87891 Personal history of nicotine dependence: Secondary | ICD-10-CM | POA: Diagnosis not present

## 2024-02-13 DIAGNOSIS — B182 Chronic viral hepatitis C: Secondary | ICD-10-CM | POA: Diagnosis present

## 2024-02-13 DIAGNOSIS — Z8619 Personal history of other infectious and parasitic diseases: Secondary | ICD-10-CM | POA: Insufficient documentation

## 2024-02-13 LAB — CBC
HCT: 44.6 % (ref 39.0–52.0)
Hemoglobin: 15 g/dL (ref 13.0–17.0)
MCH: 29.7 pg (ref 26.0–34.0)
MCHC: 33.6 g/dL (ref 30.0–36.0)
MCV: 88.3 fL (ref 80.0–100.0)
Platelets: 225 K/uL (ref 150–400)
RBC: 5.05 MIL/uL (ref 4.22–5.81)
RDW: 14.6 % (ref 11.5–15.5)
WBC: 7.3 K/uL (ref 4.0–10.5)
nRBC: 0 % (ref 0.0–0.2)

## 2024-02-13 LAB — RESP PANEL BY RT-PCR (RSV, FLU A&B, COVID)  RVPGX2
Influenza A by PCR: NEGATIVE
Influenza B by PCR: NEGATIVE
Resp Syncytial Virus by PCR: NEGATIVE
SARS Coronavirus 2 by RT PCR: NEGATIVE

## 2024-02-13 LAB — BASIC METABOLIC PANEL WITH GFR
Anion gap: 12 (ref 5–15)
BUN: 17 mg/dL (ref 8–23)
CO2: 24 mmol/L (ref 22–32)
Calcium: 8.8 mg/dL — ABNORMAL LOW (ref 8.9–10.3)
Chloride: 106 mmol/L (ref 98–111)
Creatinine, Ser: 1.3 mg/dL — ABNORMAL HIGH (ref 0.61–1.24)
GFR, Estimated: 55 mL/min — ABNORMAL LOW
Glucose, Bld: 107 mg/dL — ABNORMAL HIGH (ref 70–99)
Potassium: 3.9 mmol/L (ref 3.5–5.1)
Sodium: 142 mmol/L (ref 135–145)

## 2024-02-13 LAB — TROPONIN T, HIGH SENSITIVITY: Troponin T High Sensitivity: 31 ng/L — ABNORMAL HIGH (ref 0–19)

## 2024-02-13 MED ORDER — SODIUM CHLORIDE 0.9% FLUSH
3.0000 mL | Freq: Two times a day (BID) | INTRAVENOUS | Status: DC
  Administered 2024-02-13 – 2024-02-14 (×2): 3 mL via INTRAVENOUS

## 2024-02-13 MED ORDER — SODIUM CHLORIDE 0.9 % IV SOLN: 250.0000 mL | INTRAVENOUS | Status: DC | PRN

## 2024-02-13 MED ORDER — CARVEDILOL 3.125 MG PO TABS: 3.1250 mg | ORAL_TABLET | Freq: Two times a day (BID) | ORAL | Status: DC

## 2024-02-13 MED ORDER — ONDANSETRON HCL 4 MG PO TABS: 4.0000 mg | ORAL_TABLET | Freq: Four times a day (QID) | ORAL | Status: DC | PRN

## 2024-02-13 MED ORDER — SODIUM CHLORIDE 0.9% FLUSH: 3.0000 mL | INTRAVENOUS | Status: DC | PRN

## 2024-02-13 MED ORDER — CARVEDILOL 6.25 MG PO TABS
6.2500 mg | ORAL_TABLET | Freq: Two times a day (BID) | ORAL | Status: DC
  Filled 2024-02-13: qty 2

## 2024-02-13 MED ORDER — TAMSULOSIN HCL 0.4 MG PO CAPS
0.4000 mg | ORAL_CAPSULE | Freq: Every day | ORAL | Status: DC
  Filled 2024-02-13: qty 1

## 2024-02-13 MED ORDER — PANTOPRAZOLE SODIUM 40 MG PO TBEC
40.0000 mg | DELAYED_RELEASE_TABLET | Freq: Every day | ORAL | Status: DC
  Administered 2024-02-13: 40 mg via ORAL
  Filled 2024-02-13 (×2): qty 1

## 2024-02-13 MED ORDER — ACETAMINOPHEN 325 MG PO TABS: 650.0000 mg | ORAL_TABLET | Freq: Four times a day (QID) | ORAL | Status: DC | PRN

## 2024-02-13 MED ORDER — ENOXAPARIN SODIUM 40 MG/0.4ML IJ SOSY
40.0000 mg | PREFILLED_SYRINGE | INTRAMUSCULAR | Status: DC
  Filled 2024-02-13: qty 0.4

## 2024-02-13 MED ORDER — DEXTROSE IN LACTATED RINGERS 5 % IV SOLN: INTRAVENOUS | Status: DC

## 2024-02-13 MED ORDER — ONDANSETRON HCL 4 MG/2ML IJ SOLN: 4.0000 mg | Freq: Four times a day (QID) | INTRAMUSCULAR | Status: DC | PRN

## 2024-02-13 MED ORDER — ENSURE ENLIVE PO LIQD
237.0000 mL | Freq: Two times a day (BID) | ORAL | Status: DC
  Administered 2024-02-14: 237 mL via ORAL
  Filled 2024-02-13 (×3): qty 237

## 2024-02-13 MED ORDER — THIAMINE MONONITRATE 100 MG PO TABS
100.0000 mg | ORAL_TABLET | Freq: Every day | ORAL | Status: DC
  Filled 2024-02-13: qty 1

## 2024-02-13 MED ORDER — ALUM & MAG HYDROXIDE-SIMETH 200-200-20 MG/5ML PO SUSP: 30.0000 mL | Freq: Four times a day (QID) | ORAL | Status: DC | PRN

## 2024-02-13 MED ORDER — ACETAMINOPHEN 650 MG RE SUPP: 650.0000 mg | Freq: Four times a day (QID) | RECTAL | Status: DC | PRN

## 2024-02-13 MED ORDER — ASPIRIN 81 MG PO TBEC
81.0000 mg | DELAYED_RELEASE_TABLET | Freq: Every day | ORAL | Status: DC
  Filled 2024-02-13: qty 1

## 2024-02-13 MED ORDER — ASPIRIN 325 MG PO TABS
325.0000 mg | ORAL_TABLET | ORAL | Status: AC
  Administered 2024-02-13: 325 mg via ORAL
  Filled 2024-02-13: qty 1

## 2024-02-13 MED ORDER — ADULT MULTIVITAMIN W/MINERALS CH
1.0000 | ORAL_TABLET | Freq: Every day | ORAL | Status: DC
  Filled 2024-02-13: qty 1

## 2024-02-13 NOTE — H&P (Signed)
 " History and Physical    [name] FMW:[id number] DOB: [birth year] DOA: 02/13/2024  PCP: [facility]   Patient coming from: Patient is homeless   Chief Complaint:  Chief Complaint  Patient presents with   Dysphagia   Loss of Consciousness   ED TRIAGE note:    Patient states, while clearly not choking, that he was choking earlier after getting his dinner, also reports that he had a fainting spell on the bus with chest tightness and has leg pain, unsure how long this has been going on. Patient ambulated to triage with steady, shuffling gait. Patient has hx of SVT episodes.       HPI:  [name] is a 84 y.o. male with medical history significant of essential hypertension, chronic  dyspepsia, chronic hepatitis C, schizophrenia, anxiety, depression, hydrocele, and presented to emergency department complaining of with complaining of chest pain episode and near syncopa Patient reports he has not felt well all day today.  He reports multiple episodes of chest pain which he describes as making him feel like he is choking, then will get very short of breath and feels like he is going to pass out.  He states he has not lost consciousness as he does a lot of slow deep breaths which seems to help.  He denies prior episodes of this but admits it has happened several times today.  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  States prior to today he was feeling pretty well.  He does report coughing but denies any fever or chills.  No known sick contacts. l event. At presentation to ED patient denies any chest pain episodes and shortness of breath.   ED Course:  At presentation to ED patient found hypertensive blood pressure 159/95 after her blood pressure improved without any intervention.  Hemodynamically stable. Lab work, CBC unremarkable. BMP showing elevated creatinine 1.3, GFR 55.  Unclear patient renal function has been progressed over  a year versus development of AKI. Troponin elevated 31.  Pending second troponin level.  EKG showed normal sinus rhythm heart rate 92.  Left atrial enlargement.  There is no ST-T wave abnormality.  Chest x-ray showing cardiomegaly.  No frank interstitial edema.  Trace bilateral pleural effusion.   Hospitalist consulted for further evaluation management of history of choking episode/dysphagia, chest pain and presyncope episode.    Significant labs in the ED: Lab Orders         Resp panel by RT-PCR (RSV, Flu A&B, Covid) Anterior Nasal Swab         Basic metabolic panel         CBC         CBC         Comprehensive metabolic panel         Lipid panel         Creatinine, urine, random         Sodium, urine, random         Urinalysis, Routine w reflex microscopic -Urine, Clean Catch       Review of Systems:  Review of Systems  Respiratory:  Negative for cough and sputum production.   Cardiovascular:  Positive for chest pain. Negative for palpitations, orthopnea, claudication, leg swelling and PND.  Gastrointestinal:  Positive for heartburn. Negative for abdominal pain, nausea and vomiting.       Choking sensation with solid food  Neurological:  Negative for  dizziness, speech change, focal weakness, loss of consciousness, weakness and headaches.       Experienced near fainting episode  Psychiatric/Behavioral:  The patient is not nervous/anxious.     Past Medical History:  Diagnosis Date   Acute cystitis without hematuria 02/28/2023   AKI (acute kidney injury) 02/27/2023   AMS (altered mental status) 01/06/2023   Anemia 08/31/2012   Benign hypertrophy of prostate    Dysphagia 03/01/2023   Hepatitis C    Hypokalemia 02/06/2013   Schizophrenia (HCC)    Swelling of lower extremity 01/13/2023   Urinary tract infection    Urinary tract infection 06/13/2011   IMO SNOMED Dx Update Oct 2024     UTI (urinary tract infection) 06/13/2011   IMO SNOMED Dx Update Oct 2024      Past  Surgical History:  Procedure Laterality Date   KNEE SURGERY     right     reports that he quit smoking about 48 years ago. His smoking use included cigarettes. He started smoking about 68 years ago. He has a 20 pack-year smoking history. He has never used smokeless tobacco. He reports that he does not drink alcohol and does not use drugs.  Allergies[1]  History reviewed. No pertinent family history.  Prior to Admission medications  Medication Sig Start Date End Date Taking? Authorizing Provider  carvedilol  (COREG ) 3.125 MG tablet Take 1 tablet (3.125 mg total) by mouth 2 (two) times daily with a meal. 06/03/23   Armenta Canning, MD  diltiazem  (CARDIZEM  CD) 180 MG 24 hr capsule Take 1 capsule (180 mg total) by mouth daily. 03/21/23   Jadapalle, Sree, MD  feeding supplement (ENSURE ENLIVE / ENSURE PLUS) LIQD Take 237 mLs by mouth 2 (two) times daily between meals. 03/09/23   Gomes, Adriana, DO  haloperidol  (HALDOL ) 10 MG tablet Take 1 tablet (10 mg total) by mouth 2 (two) times daily. 03/21/23   Jadapalle, Sree, MD  haloperidol  decanoate (HALDOL  DECANOATE) 100 MG/ML injection Inject 1 mL (100 mg total) into the muscle once a week. Pt received 1st IM injection on 2/13, needs to receive next on 04/07/23 IM injection 03/21/23 04/20/23  Jadapalle, Sree, MD  Multiple Vitamin (MULTIVITAMIN WITH MINERALS) TABS tablet Take 1 tablet by mouth daily. 03/21/23   Jadapalle, Sree, MD  tamsulosin  (FLOMAX ) 0.4 MG CAPS capsule Take 1 capsule (0.4 mg total) by mouth daily. 03/21/23   Donnelly Mellow, MD  thiamine  (VITAMIN B-1) 100 MG tablet Take 1 tablet (100 mg total) by mouth daily. 03/21/23   Donnelly Mellow, MD     Physical Exam: Vitals:   02/13/24 1959 02/13/24 2045 02/13/24 2135 02/13/24 2156  BP:  118/74  120/76  Pulse:  87  90  Resp:  17  19  Temp:   98.2 F (36.8 C)   TempSrc:   Oral   SpO2:  100%  100%  Weight: 75 kg     Height: 5' 8 (1.727 m)       Physical Exam Vitals and nursing note reviewed.   Constitutional:      Appearance: He is not ill-appearing.  HENT:     Mouth/Throat:     Mouth: Mucous membranes are moist.  Eyes:     Pupils: Pupils are equal, round, and reactive to light.  Cardiovascular:     Rate and Rhythm: Normal rate and regular rhythm.     Pulses: Normal pulses.     Heart sounds: Normal heart sounds.  Pulmonary:     Effort: Pulmonary  effort is normal.     Breath sounds: Normal breath sounds.  Abdominal:     Palpations: Abdomen is soft.  Musculoskeletal:     Cervical back: Neck supple.     Right lower leg: No edema.     Left lower leg: No edema.  Skin:    Capillary Refill: Capillary refill takes less than 2 seconds.  Neurological:     Mental Status: He is alert and oriented to person, place, and time.  Psychiatric:        Mood and Affect: Mood normal.      Labs on Admission: I have personally reviewed following labs and imaging studies  CBC: Recent Labs  Lab 02/13/24 2022  WBC 7.3  HGB 15.0  HCT 44.6  MCV 88.3  PLT 225   Basic Metabolic Panel: Recent Labs  Lab 02/13/24 2022  NA 142  K 3.9  CL 106  CO2 24  GLUCOSE 107*  BUN 17  CREATININE 1.30*  CALCIUM  8.8*   GFR: Estimated Creatinine Clearance: 43.1 mL/min (A) (by C-G formula based on SCr of 1.3 mg/dL (H)). Liver Function Tests: No results for input(s): AST, ALT, ALKPHOS, BILITOT, PROT, ALBUMIN in the last 168 hours. No results for input(s): LIPASE, AMYLASE in the last 168 hours. No results for input(s): AMMONIA in the last 168 hours. Coagulation Profile: No results for input(s): INR, PROTIME in the last 168 hours. Cardiac Enzymes: No results for input(s): CKTOTAL, CKMB, CKMBINDEX, TROPONINI, TROPONINIHS in the last 168 hours. BNP (last 3 results) No results for input(s): BNP in the last 8760 hours. HbA1C: No results for input(s): HGBA1C in the last 72 hours. CBG: No results for input(s): GLUCAP in the last 168 hours. Lipid  Profile: No results for input(s): CHOL, HDL, LDLCALC, TRIG, CHOLHDL, LDLDIRECT in the last 72 hours. Thyroid Function Tests: No results for input(s): TSH, T4TOTAL, FREET4, T3FREE, THYROIDAB in the last 72 hours. Anemia Panel: No results for input(s): VITAMINB12, FOLATE, FERRITIN, TIBC, IRON, RETICCTPCT in the last 72 hours. Urine analysis:    Component Value Date/Time   COLORURINE AMBER (A) 06/03/2023 2321   APPEARANCEUR CLOUDY (A) 06/03/2023 2321   LABSPEC 1.019 06/03/2023 2321   PHURINE 5.0 06/03/2023 2321   GLUCOSEU NEGATIVE 06/03/2023 2321   HGBUR SMALL (A) 06/03/2023 2321   BILIRUBINUR NEGATIVE 06/03/2023 2321   KETONESUR NEGATIVE 06/03/2023 2321   PROTEINUR 100 (A) 06/03/2023 2321   UROBILINOGEN 1.0 07/16/2014 0021   NITRITE NEGATIVE 06/03/2023 2321   LEUKOCYTESUR MODERATE (A) 06/03/2023 2321    Radiological Exams on Admission: I have personally reviewed images DG Chest 2 View Result Date: 02/13/2024 EXAM: 2 VIEW(S) XRAY OF THE CHEST 02/13/2024 08:58:00 PM COMPARISON: 06/03/2023 CLINICAL HISTORY: chest tightness chest tightness chest tightness FINDINGS: LUNGS AND PLEURA: Low lung volumes. No frank interstitial edema. Trace bilateral pleural effusions. No pneumothorax. No focal pulmonary opacity. HEART AND MEDIASTINUM: Enlarged cardiomediastinal silhouette. BONES AND SOFT TISSUES: Moderate hiatal hernia. No acute osseous abnormality. IMPRESSION: 1. Cardiomegaly. No frank interstitial edema. 2. Trace bilateral pleural effusions. Electronically signed by: Pinkie Pebbles MD 02/13/2024 09:07 PM EST RP Workstation: [id number]     EKG: My personal interpretation of EKG shows: Normal sinus rhythm heart rate 92 and left atrial enlargement.    Assessment/Plan: Principal Problem:   Chest pain Active Problems:   Dysphagia   Chronic hepatitis C (HCC)   Schizophrenia (HCC)   Near syncope   Elevated serum creatinine   History of hepatitis C    Anxiety and depression  Essential hypertension    Assessment and Plan: Chest pain-resolved - Patient presented emergency department complaining of intermittent chest pain, chronic dyspepsia and complaining about he was choking while having dinner and that caused near fainting episode in the bus with feeling sensation of chest tightness.  Patient reported all the symptom has been ongoing for a provide however unable to specify any timeframe. -Presented to ED and found borderline hypertensive 159/95 and heart rate 104.  Blood pressure improved.  EKG showed normal sinus rhythm heart rate 92, possible left atrial enlargement and there is no history of abnormality. - Lab work, elevated first troponin 31.  Pending second troponin level. -Chest x-ray showing cardiomegaly and trace bilateral pleural effusion. - Concern for intermittent chest pain either from cardiac origin versus dysphagia from esophageal origin. -Continue to trend troponin, obtain echocardiogram to assess for wall motion abnormality. -Discussed case with on-call cardiology Dr. Melia to request for stress test in the daytime. - Giving aspirin  load 325 mg followed by continue aspirin  81 mg daily.  Checking lipid panel. Continue Coreg  twice daily. Continue cardiac monitoring.  Near syncope -Patient reported near fainting episode when he had choking sensation while eating his dinner.  Concern for near syncopal episode in the setting of increased vagal stimulation.  Checking orthostatic vital.  Obtain echocardiogram.  Dysphagia Dyspepsia -Patient reported history of chronic choking sensation and heartburn. - Keeping patient NPO.  Consulted with speech for barium swallow study. -Starting oral Protonix  40 mg daily and Maalox as needed.  Essential hypertension Continue Coreg .  Pending verification of home medications.  Elevated serum creatinine -Elevated serum creatinine 1.3 and creatinine was 1.358 months ago.  Unclear if patient  progressed CKD stage IIIa versus development of AKI.  Continue to monitor renal function.  Currently on maintenance fluid D5 LR 75 cc/h.  Checking UA, urine creatinine and sodium.  History of anxiety, depression and schizophrenia -[facility] and [facility] list is not updated.  Pending pharmacy verifications of home meds.  History of chronic hepatitis C infection -Pending pharmacy verification appointments  History of shuffling gait/ataxia   DVT prophylaxis:  Lovenox  Code Status:  Full Code Diet:  Family Communication:  *** Family was present at bedside, at the time of interview.  Opportunity was given to ask question and all questions were answered satisfactorily.  Disposition Plan:  ***  Consults:  ***  Admission status:   Inpatient, Telemetry bed  Severity of Illness: The appropriate patient status for this patient is INPATIENT. Inpatient status is judged to be reasonable and necessary in order to provide the required intensity of service to ensure the patient's safety. The patient's presenting symptoms, physical exam findings, and initial radiographic and laboratory data in the context of their chronic comorbidities is felt to place them at high risk for further clinical deterioration. Furthermore, it is not anticipated that the patient will be medically stable for discharge from the hospital within 2 midnights of admission.   * I certify that at the point of admission it is my clinical judgment that the patient will require inpatient hospital care spanning beyond 2 midnights from the point of admission due to high intensity of service, high risk for further deterioration and high frequency of surveillance required.DEWAINE    Ting Cage, MD [facility]  How to contact the [facility] Attending or Consulting provider 7A - 7P or covering provider during after hours 7P -7A, for this patient.  Check the care team in [facility] and look for a) attending/consulting  [facility]  provider listed and b) the [facility] team listed Log into [email or web address] and use [facility] universal password to access. If you do not have the password, please contact the hospital operator. Locate the [facility] provider you are looking for under [facility] and page to a number that you can be directly reached. If you still have difficulty reaching the provider, please page the [facility] (Director on Call) for the Hospitalists listed on amion for assistance.  02/13/2024, 11:08 PM          [1] No Known Allergies  "

## 2024-02-13 NOTE — ED Provider Notes (Signed)
 " [facility] Provider Note   CSN: [id number] Arrival date & time: 02/13/24  1909     Patient presents with: Dysphagia and Loss of Consciousness   [name] is a 84 y.o. male.   The history is provided by the patient and medical records.  Loss of Consciousness Associated symptoms: chest pain    84 year old male with history of schizophrenia, hepatitis C, chronic dysphagia, presenting to the ED after episode of chest pain and near syncopal events.  Patient reports he has not felt well all day today.  He reports multiple episodes of chest pain which he describes as making him feel like he is choking, then will get very short of breath and feels like he is going to pass out.  He states he has not lost consciousness as he does a lot of slow deep breaths which seems to help.  He denies prior episodes of this but admits it has happened several times today.  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  States prior to today he was feeling pretty well.  He does report coughing but denies any fever or chills.  No known sick contacts.  Prior to Admission medications  Medication Sig Start Date End Date Taking? Authorizing Provider  carvedilol  (COREG ) 3.125 MG tablet Take 1 tablet (3.125 mg total) by mouth 2 (two) times daily with a meal. 06/03/23   Armenta Canning, MD  diltiazem  (CARDIZEM  CD) 180 MG 24 hr capsule Take 1 capsule (180 mg total) by mouth daily. 03/21/23   Jadapalle, Sree, MD  feeding supplement (ENSURE ENLIVE / ENSURE PLUS) LIQD Take 237 mLs by mouth 2 (two) times daily between meals. 03/09/23   Gomes, Adriana, DO  haloperidol  (HALDOL ) 10 MG tablet Take 1 tablet (10 mg total) by mouth 2 (two) times daily. 03/21/23   Jadapalle, Sree, MD  haloperidol  decanoate (HALDOL  DECANOATE) 100 MG/ML injection Inject 1 mL (100 mg total) into the muscle once a week. Pt received 1st IM injection on 2/13, needs to  receive next on 04/07/23 IM injection 03/21/23 04/20/23  Jadapalle, Sree, MD  Multiple Vitamin (MULTIVITAMIN WITH MINERALS) TABS tablet Take 1 tablet by mouth daily. 03/21/23   Jadapalle, Sree, MD  tamsulosin  (FLOMAX ) 0.4 MG CAPS capsule Take 1 capsule (0.4 mg total) by mouth daily. 03/21/23   Jadapalle, Sree, MD  thiamine  (VITAMIN B-1) 100 MG tablet Take 1 tablet (100 mg total) by mouth daily. 03/21/23   Jadapalle, Sree, MD    Allergies: Patient has no known allergies.    Review of Systems  Cardiovascular:  Positive for chest pain and syncope.  All other systems reviewed and are negative.   Updated Vital Signs BP 120/76   Pulse 90   Temp 98.2 F (36.8 C) (Oral)   Resp 19   Ht 5' 8 (1.727 m)   Wt 75 kg   SpO2 100%   BMI 25.14 kg/m   Physical Exam Vitals and nursing note reviewed.  Constitutional:      Appearance: He is well-developed.     Comments: Very hard of hearing  HENT:     Head: Normocephalic and atraumatic.  Eyes:     Conjunctiva/sclera: Conjunctivae normal.     Pupils: Pupils are equal, round, and reactive to light.  Cardiovascular:     Rate and Rhythm: Normal rate and regular rhythm.     Heart sounds: Murmur heard.  Pulmonary:  Effort: Pulmonary effort is normal.     Breath sounds: Normal breath sounds.  Abdominal:     General: Bowel sounds are normal.     Palpations: Abdomen is soft.  Musculoskeletal:        General: Normal range of motion.     Cervical back: Normal range of motion.  Skin:    General: Skin is warm and dry.  Neurological:     Mental Status: He is alert and oriented to person, place, and time.     (all labs ordered are listed, but only abnormal results are displayed) Labs Reviewed  BASIC METABOLIC PANEL WITH GFR - Abnormal; Notable for the following components:      Result Value   Glucose, Bld 107 (*)    Creatinine, Ser 1.30 (*)    Calcium  8.8 (*)    GFR, Estimated 55 (*)    All other components within normal limits  TROPONIN T,  HIGH SENSITIVITY - Abnormal; Notable for the following components:   Troponin T High Sensitivity 31 (*)    All other components within normal limits  RESP PANEL BY RT-PCR (RSV, FLU A&B, COVID)  [id number]  CBC  CBC  COMPREHENSIVE METABOLIC PANEL WITH GFR  LIPID PANEL  TROPONIN T, HIGH SENSITIVITY    EKG: None  Radiology: DG Chest 2 View Result Date: 02/13/2024 EXAM: 2 VIEW(S) XRAY OF THE CHEST 02/13/2024 08:58:00 PM COMPARISON: 06/03/2023 CLINICAL HISTORY: chest tightness chest tightness chest tightness FINDINGS: LUNGS AND PLEURA: Low lung volumes. No frank interstitial edema. Trace bilateral pleural effusions. No pneumothorax. No focal pulmonary opacity. HEART AND MEDIASTINUM: Enlarged cardiomediastinal silhouette. BONES AND SOFT TISSUES: Moderate hiatal hernia. No acute osseous abnormality. IMPRESSION: 1. Cardiomegaly. No frank interstitial edema. 2. Trace bilateral pleural effusions. Electronically signed by: Pinkie Pebbles MD 02/13/2024 09:07 PM EST RP Workstation: [id number]     Procedures   Medications Ordered in the ED  aspirin  tablet 325 mg (has no administration in time range)  aspirin  EC tablet 81 mg (has no administration in time range)  alum & mag hydroxide-simeth (MAALOX/MYLANTA) 200-200-20 MG/5ML suspension 30 mL (has no administration in time range)  carvedilol  (COREG ) tablet 3.125 mg (has no administration in time range)  tamsulosin  (FLOMAX ) capsule 0.4 mg (has no administration in time range)  feeding supplement (ENSURE ENLIVE / ENSURE PLUS) liquid 237 mL (has no administration in time range)  multivitamin with minerals tablet 1 tablet (has no administration in time range)  thiamine  (VITAMIN B1) tablet 100 mg (has no administration in time range)  enoxaparin  (LOVENOX ) injection 40 mg (has no administration in time range)  pantoprazole  (PROTONIX ) EC tablet 40 mg (has no administration in time range)  sodium chloride  flush (NS) 0.9 % injection 3 mL (has no administration  in time range)  dextrose  5 % in lactated ringers  infusion (has no administration in time range)  sodium chloride  flush (NS) 0.9 % injection 3 mL (has no administration in time range)  sodium chloride  flush (NS) 0.9 % injection 3 mL (has no administration in time range)  0.9 %  sodium chloride  infusion (has no administration in time range)  acetaminophen  (TYLENOL ) tablet 650 mg (has no administration in time range)    Or  acetaminophen  (TYLENOL ) suppository 650 mg (has no administration in time range)  ondansetron  (ZOFRAN ) tablet 4 mg (has no administration in time range)    Or  ondansetron  (ZOFRAN ) injection 4 mg (has no administration in time range)  Medical Decision Making Amount and/or Complexity of Data Reviewed Labs: ordered. Radiology: ordered and independent interpretation performed. ECG/medicine tests: ordered and independent interpretation performed.  Risk Decision regarding hospitalization.   84 y.o. M here after some episodes of chest pain and near syncope today.  Denies true LOC, head injury, etc.  Currently, asymptomatic.  Afebrile, non-toxic here.  He does have a cardiac murmur, documented previously as well.  VSS currently.  EKG is non-ischemic.  Labs as above-- SrCr 1.3, appears similar to prior.  Trop 31, has always been negative previously. CXR with cardiomegaly but no edema or acute infiltrate noted.  Has had a cough so will add on RVP.  In light of his positive troponin and symptoms today, will admit for observation.  Discussed with hospitalist, Dr. Sundil-- will admit for ongoing care.  Final diagnoses:  Near syncope  Chest pain in adult    ED Discharge Orders     None          Jarold Olam CHRISTELLA DEVONNA 02/13/24 2303    Rogelia Jerilynn RAMAN, MD 02/24/24 [phone]  "

## 2024-02-13 NOTE — ED Triage Notes (Signed)
 He states he is having choking and fainting spells. He says when he eats he starts choking. He states he was having fainting spells on the bus he was just one. Denies falling and hitting head.

## 2024-02-13 NOTE — H&P (Incomplete)
 " History and Physical    [name] FMW:[id number] DOB: Oct 07, 1940 DOA: 02/13/2024  [facility]P: [facility]   Patient coming from: Home   Chief Complaint:  Chief Complaint  Patient presents with   Dysphagia   Loss of Consciousness   ED TRIAGE note:Patient states, while clearly not choking, that he was choking earlier after getting his dinner, also reports that he had a fainting spell on the bus with chest tightness and has leg pain, unsure how long this has been going on. Patient ambulated to triage with steady, shuffling gait. Patient has hx of SVT episodes.   HPI:  [name] is a 84 y.o. male with medical history significant of    ED Course: ***    Significant labs in the ED:  Lab Orders         Resp panel by RT-PCR (RSV, Flu A&B, Covid) Anterior Nasal Swab         Basic metabolic panel         CBC       Review of Systems:  ROS  Past Medical History:  Diagnosis Date   Acute cystitis without hematuria 02/28/2023   AKI (acute kidney injury) 02/27/2023   AMS (altered mental status) 01/06/2023   Anemia 08/31/2012   Benign hypertrophy of prostate    Dysphagia 03/01/2023   Hepatitis C    Hypokalemia 02/06/2013   Schizophrenia (HCC)    Swelling of lower extremity 01/13/2023   Urinary tract infection    Urinary tract infection 06/13/2011   IMO SNOMED Dx Update Oct 2024     UTI (urinary tract infection) 06/13/2011   IMO SNOMED Dx Update Oct 2024      Past Surgical History:  Procedure Laterality Date   KNEE SURGERY     right     reports that he quit smoking about 48 years ago. His smoking use included cigarettes. He started smoking about 68 years ago. He has a 20 pack-year smoking history. He has never used smokeless tobacco. He reports that he does not drink alcohol and does not use drugs.  Allergies[1]  History reviewed. No pertinent family history.  Prior to Admission medications  Medication Sig Start Date End Date Taking? Authorizing  Provider  carvedilol  (COREG ) 3.125 MG tablet Take 1 tablet (3.125 mg total) by mouth 2 (two) times daily with a meal. 06/03/23   Armenta Canning, MD  diltiazem  (CARDIZEM  CD) 180 MG 24 hr capsule Take 1 capsule (180 mg total) by mouth daily. 03/21/23   Jadapalle, Sree, MD  feeding supplement (ENSURE ENLIVE / ENSURE PLUS) LIQD Take 237 mLs by mouth 2 (two) times daily between meals. 03/09/23   Gomes, Adriana, DO  haloperidol  (HALDOL ) 10 MG tablet Take 1 tablet (10 mg total) by mouth 2 (two) times daily. 03/21/23   Jadapalle, Sree, MD  haloperidol  decanoate (HALDOL  DECANOATE) 100 MG/ML injection Inject 1 mL (100 mg total) into the muscle once a week. Pt received 1st IM injection on 2/13, needs to receive next on 04/07/23 IM injection 03/21/23 04/20/23  Jadapalle, Sree, MD  Multiple Vitamin (MULTIVITAMIN WITH MINERALS) TABS tablet Take 1 tablet by mouth daily. 03/21/23   Jadapalle, Sree, MD  tamsulosin  (FLOMAX ) 0.4 MG CAPS capsule Take 1 capsule (0.4 mg total) by mouth daily. 03/21/23   Donnelly Mellow, MD  thiamine  (VITAMIN B-1) 100 MG tablet Take 1 tablet (100 mg total) by mouth daily. 03/21/23   Donnelly Mellow, MD     Physical Exam: Vitals:  02/13/24 1959 02/13/24 2045 02/13/24 2135 02/13/24 2156  BP:  118/74  120/76  Pulse:  87  90  Resp:  17  19  Temp:   98.2 F (36.8 C)   TempSrc:   Oral   SpO2:  100%  100%  Weight: 75 kg     Height: 5' 8 (1.727 m)       Physical Exam   Labs on Admission: I have personally reviewed following labs and imaging studies  CBC: Recent Labs  Lab 02/13/24 2022  WBC 7.3  HGB 15.0  HCT 44.6  MCV 88.3  PLT 225   Basic Metabolic Panel: Recent Labs  Lab 02/13/24 2022  NA 142  K 3.9  CL 106  CO2 24  GLUCOSE 107*  BUN 17  CREATININE 1.30*  CALCIUM  8.8*   GFR: Estimated Creatinine Clearance: 43.1 mL/min (A) (by C-G formula based on SCr of 1.3 mg/dL (H)). Liver Function Tests: No results for input(s): AST, ALT, ALKPHOS, BILITOT, PROT,  ALBUMIN in the last 168 hours. No results for input(s): LIPASE, AMYLASE in the last 168 hours. No results for input(s): AMMONIA in the last 168 hours. Coagulation Profile: No results for input(s): INR, PROTIME in the last 168 hours. Cardiac Enzymes: No results for input(s): CKTOTAL, CKMB, CKMBINDEX, TROPONINI, TROPONINIHS in the last 168 hours. BNP (last 3 results) No results for input(s): BNP in the last 8760 hours. HbA1C: No results for input(s): HGBA1C in the last 72 hours. CBG: No results for input(s): GLUCAP in the last 168 hours. Lipid Profile: No results for input(s): CHOL, HDL, LDLCALC, TRIG, CHOLHDL, LDLDIRECT in the last 72 hours. Thyroid Function Tests: No results for input(s): TSH, T4TOTAL, FREET4, T3FREE, THYROIDAB in the last 72 hours. Anemia Panel: No results for input(s): VITAMINB12, FOLATE, FERRITIN, TIBC, IRON, RETICCTPCT in the last 72 hours. Urine analysis:    Component Value Date/Time   COLORURINE AMBER (A) 06/03/2023 2321   APPEARANCEUR CLOUDY (A) 06/03/2023 2321   LABSPEC 1.019 06/03/2023 2321   PHURINE 5.0 06/03/2023 2321   GLUCOSEU NEGATIVE 06/03/2023 2321   HGBUR SMALL (A) 06/03/2023 2321   BILIRUBINUR NEGATIVE 06/03/2023 2321   KETONESUR NEGATIVE 06/03/2023 2321   PROTEINUR 100 (A) 06/03/2023 2321   UROBILINOGEN 1.0 07/16/2014 0021   NITRITE NEGATIVE 06/03/2023 2321   LEUKOCYTESUR MODERATE (A) 06/03/2023 2321    Radiological Exams on Admission: I have personally reviewed images DG Chest 2 View Result Date: 02/13/2024 EXAM: 2 VIEW(S) XRAY OF THE CHEST 02/13/2024 08:58:00 PM COMPARISON: 06/03/2023 CLINICAL HISTORY: chest tightness chest tightness chest tightness FINDINGS: LUNGS AND PLEURA: Low lung volumes. No frank interstitial edema. Trace bilateral pleural effusions. No pneumothorax. No focal pulmonary opacity. HEART AND MEDIASTINUM: Enlarged cardiomediastinal silhouette. BONES AND SOFT  TISSUES: Moderate hiatal hernia. No acute osseous abnormality. IMPRESSION: 1. Cardiomegaly. No frank interstitial edema. 2. Trace bilateral pleural effusions. Electronically signed by: Pinkie Pebbles MD 02/13/2024 09:07 PM EST RP Workstation: [id number]     EKG: My personal interpretation of EKG shows: ***    Assessment/Plan: Active Problems:   * No active hospital problems. *    Assessment and Plan: No notes have been filed under this hospital service. Service: Hospitalist      DVT prophylaxis:  {Blank single:19197::Lovenox ,SQ Heparin,IV heparin gtts,Xarelto,Eliquis,Coumadin,SCDs,***} Code Status:  {Blank single:19197::Full Code,DNR with Intubation,DNR/DNI(Do NOT Intubate),Comfort Care,***} Diet:  Family Communication:  *** Family was present at bedside, at the time of interview.  Opportunity was given to ask question and all questions were answered satisfactorily.  Disposition Plan:  ***  Consults:  ***  Admission status:   {Blank single:19197::Observation,Inpatient}, {Blank single:19197::Med-Surg,Telemetry bed,Step Down Unit}  Severity of Illness: {Observation/Inpatient:21159}    Micaela Speaker, MD [facility]  How to contact the [facility] Attending or Consulting provider 7A - 7P or covering provider during after hours 7P -7A, for this patient.  Check the care team in [facility] and look for a) attending/consulting [facility] provider listed and b) the [facility] team listed Log into [email or web address] and use [facility] universal password to access. If you do not have the password, please contact the hospital operator. Locate the [facility] provider you are looking for under [facility] and page to a number that you can be directly reached. If you still have difficulty reaching the provider, please page the [facility] (Director on Call) for the Hospitalists listed on amion for assistance.  02/13/2024, 10:36 PM          [1] No Known Allergies  "

## 2024-02-13 NOTE — ED Triage Notes (Addendum)
 Patient states, while clearly not choking, that he was choking earlier after getting his dinner, also reports that he had a fainting spell on the bus with chest tightness and has leg pain, unsure how long this has been going on. Patient ambulated to triage with steady, shuffling gait. Patient has hx of SVT episodes.

## 2024-02-14 ENCOUNTER — Inpatient Hospital Stay (HOSPITAL_COMMUNITY)

## 2024-02-14 DIAGNOSIS — R079 Chest pain, unspecified: Secondary | ICD-10-CM

## 2024-02-14 DIAGNOSIS — R1310 Dysphagia, unspecified: Secondary | ICD-10-CM | POA: Diagnosis not present

## 2024-02-14 DIAGNOSIS — R1013 Epigastric pain: Secondary | ICD-10-CM

## 2024-02-14 LAB — LIPID PANEL
Cholesterol: 121 mg/dL (ref 0–200)
Cholesterol: 120 mg/dL (ref 0–200)
HDL: 43 mg/dL
HDL: 43 mg/dL
LDL Cholesterol: 69 mg/dL (ref 0–99)
LDL Cholesterol: 69 mg/dL (ref 0–99)
Total CHOL/HDL Ratio: 2.8 ratio
Total CHOL/HDL Ratio: 2.8 ratio
Triglycerides: 46 mg/dL
Triglycerides: 40 mg/dL
VLDL: 9 mg/dL (ref 0–40)
VLDL: 8 mg/dL (ref 0–40)

## 2024-02-14 LAB — URINALYSIS, ROUTINE W REFLEX MICROSCOPIC
Bilirubin Urine: NEGATIVE
Glucose, UA: NEGATIVE mg/dL
Hgb urine dipstick: NEGATIVE
Ketones, ur: NEGATIVE mg/dL
Nitrite: NEGATIVE
Protein, ur: 100 mg/dL — AB
Specific Gravity, Urine: 1.024 (ref 1.005–1.030)
WBC, UA: >50 WBC/hpf (ref 0–5)
pH: 5 (ref 5.0–8.0)

## 2024-02-14 LAB — COMPREHENSIVE METABOLIC PANEL WITH GFR
ALT: 8 U/L (ref 0–44)
AST: 22 U/L (ref 15–41)
Albumin: 3.2 g/dL — ABNORMAL LOW (ref 3.5–5.0)
Alkaline Phosphatase: 116 U/L (ref 38–126)
Anion gap: 9 (ref 5–15)
BUN: 17 mg/dL (ref 8–23)
CO2: 25 mmol/L (ref 22–32)
Calcium: 8.1 mg/dL — ABNORMAL LOW (ref 8.9–10.3)
Chloride: 107 mmol/L (ref 98–111)
Creatinine, Ser: 1.15 mg/dL (ref 0.61–1.24)
GFR, Estimated: >60 mL/min
Glucose, Bld: 112 mg/dL — ABNORMAL HIGH (ref 70–99)
Potassium: 3.8 mmol/L (ref 3.5–5.1)
Sodium: 141 mmol/L (ref 135–145)
Total Bilirubin: 0.8 mg/dL (ref 0.0–1.2)
Total Protein: 6.6 g/dL (ref 6.5–8.1)

## 2024-02-14 LAB — CBC
HCT: 39.9 % (ref 39.0–52.0)
Hemoglobin: 13.3 g/dL (ref 13.0–17.0)
MCH: 29.1 pg (ref 26.0–34.0)
MCHC: 33.3 g/dL (ref 30.0–36.0)
MCV: 87.3 fL (ref 80.0–100.0)
Platelets: 208 K/uL (ref 150–400)
RBC: 4.57 MIL/uL (ref 4.22–5.81)
RDW: 14.6 % (ref 11.5–15.5)
WBC: 6.6 K/uL (ref 4.0–10.5)
nRBC: 0 % (ref 0.0–0.2)

## 2024-02-14 LAB — ECHOCARDIOGRAM COMPLETE
AR max vel: 2.46 cm2
AV Area VTI: 2.56 cm2
AV Area mean vel: 2.57 cm2
AV Mean grad: 9 mmHg
AV Peak grad: 14.4 mmHg
Ao pk vel: 1.9 m/s
Area-P 1/2: 3.74 cm2
Height: 68 in
S' Lateral: 2.4 cm
Weight: 2645.52 [oz_av]

## 2024-02-14 LAB — SODIUM, URINE, RANDOM: Sodium, Ur: 107 mmol/L

## 2024-02-14 LAB — TSH: TSH: 1.08 u[IU]/mL (ref 0.350–4.500)

## 2024-02-14 LAB — TROPONIN T, HIGH SENSITIVITY: Troponin T High Sensitivity: 26 ng/L — ABNORMAL HIGH (ref 0–19)

## 2024-02-14 LAB — FOLATE: Folate: >20 ng/mL

## 2024-02-14 LAB — CREATININE, URINE, RANDOM: Creatinine, Urine: 254 mg/dL

## 2024-02-14 LAB — HIV ANTIBODY (ROUTINE TESTING W REFLEX): HIV Screen 4th Generation wRfx: NONREACTIVE

## 2024-02-14 LAB — VITAMIN B12: Vitamin B-12: 607 pg/mL (ref 180–914)

## 2024-02-14 MED ORDER — SODIUM CHLORIDE 0.9 % IV SOLN
1.0000 g | INTRAVENOUS | Status: DC
  Administered 2024-02-14: 1 g via INTRAVENOUS
  Filled 2024-02-14: qty 10

## 2024-02-14 NOTE — Progress Notes (Signed)
 Echocardiogram 2D Echocardiogram has been performed.  [name] 02/14/2024, 3:38 PM

## 2024-02-14 NOTE — Progress Notes (Signed)
" °  IR BRIEF PROGRESS NOTE:  Patient is recommended for esophagram. Patient was brought down to Fluoroscopy for attempt, but was belligerent and yelling, not able to calm down. Esophagram was aborted. IR will re-attempt tomorrow. Care Team informed by secure chat.    Electronically Signed: Carlin DELENA Griffon, PA-C 02/14/2024, 4:11 PM      "

## 2024-02-14 NOTE — ED Notes (Signed)
 Pharmacy notified Ensure has not been received.

## 2024-02-14 NOTE — ED Notes (Signed)
 Zofran  offered to help with vomiting, pt gets loud and states that he wants a list of all the medications that we want for him

## 2024-02-14 NOTE — Progress Notes (Signed)
 " PROGRESS NOTE    [name]  FMW:[id number] DOB: 1940-07-26 DOA: 02/13/2024 [facility]CP: [facility]   Brief Narrative:  This 84 yrs old Male with medical history significant for essential hypertension, chronic  dyspepsia, chronic hepatitis C, schizophrenia, anxiety, depression, hydrocele, presented in the ED with complaints of chest pain and near syncope.  Patient reported he has not felt well all day yesterday, had multiple episodes of chest pain which he describes as making him feel like he is choking then will get very short of breath and feels like he is going to pass out.  He denies any loss of consciousness but reports a lot of slow deep breathing which seems to help.  He denies prior episodes of this and admits it had happened several times yesterday.  Patient does have history of SVT but has not had any issues with that in quite some time.  In the ED he was hypertensive other vitals were stable.  Serum creatinine 1.3, troponin 31.  EKG normal sinus rhythm.  Chest x-ray showing cardiomegaly but no frank institutional edema.  Patient was admitted for further evaluation.  Cardiology was consulted.   Assessment & Plan:   Principal Problem:   Chest pain Active Problems:   Dysphagia   Chronic hepatitis C (HCC)   Schizophrenia (HCC)   Near syncope   Elevated serum creatinine   History of hepatitis C   Anxiety and depression   Essential hypertension  Chest pain - resolved: Patient presented in the ED with intermittent chest pain, Chronic dyspepsia and C/O: choking while having dinner and that caused near fainting episode with feeling sensation of chest tightness.   EKG showed normal sinus rhythm heart rate 92, possible left atrial enlargement and there is no history of abnormality. Troponins  Flat >  31 and 26.   Chest x-ray showing cardiomegaly and trace bilateral pleural effusion. Concern for intermittent chest pain either from cardiac origin versus dysphagia from esophageal  origin. Obtain echocardiogram to assess wall motion abnormality. Cardiology Dr. Melia to request for stress test in the daytime. Continue Aspirin  81 mg daily. Continue Coreg  twice daily. Continue cardiac monitoring.  Urinary tract infection: UA consistent with UTI. Start ceftriaxone  1 g daily.  Follow-up urine culture.  Near syncope: Patient reported near fainting episode when he had choking sensation while eating his dinner.   Concern for near syncopal episode in the setting of increased vagal stimulation.   Checking orthostatic vital.  Obtain echocardiogram.   Dysphagia: Dyspepsia: Patient reported history of chronic choking sensation and heartburn. Speech and swallow evaluation completed, recommended GI eval. Continue Protonix  40 mg daily and Maalox. Gastroenterology consulted given history of dyspepsia and dysphagia and choking sensation while eating.  Essential hypertension; Continue Coreg .   BP better controlled.   AKI: Renal functions improved with IV hydration. Avoid nephrotoxic medications.  History of anxiety, depression and schizophrenia [facility] and [facility] list is not updated.   Pending pharmacy verifications of home meds. Psychiatry is consulted since patient is showing hallucinations and delusions.   History of chronic hepatitis C infection: Pending pharmacy verification appointments   History of shuffling gait/ataxia: PT/ OT evaluation   DVT prophylaxis: Lovenox  Code Status: Full code Family Communication: No family at bed side Disposition Plan:     Status is: Inpatient Remains inpatient appropriate because: Patient admitted for intermittent chest pain likely atypical.  Cardiology is consulted.  Patient also has hallucinations,  delusions.  psychiatry is consulted.  Patient also  needs GI evaluation given history of dyspepsia.  Patient is not medically ready for discharge.     Consultants:   Cardiology Psychiatry Gastroenterology  Procedures: Echo  Antimicrobials:  Anti-infectives (From admission, onward)    Start     Dose/Rate Route Frequency Ordered Stop   02/14/24 1045  cefTRIAXone  (ROCEPHIN ) 1 g in sodium chloride  0.9 % 100 mL IVPB        1 g 200 mL/hr over 30 Minutes Intravenous Every 24 hours 02/14/24 1039        Subjective: Patient was seen and examined at bedside.  Overnight events noted. Patient is talking nonsensically, appears like having delusions of persecution.  Objective: Vitals:   02/14/24 0630 02/14/24 0635 02/14/24 0756 02/14/24 0845  BP: 127/84   (!) 128/91  Pulse: 60   70  Resp: 20   19  Temp:  98 F (36.7 C)    TempSrc:  Oral    SpO2: 100%  100% 100%  Weight:      Height:       No intake or output data in the 24 hours ending 02/14/24 1100 Filed Weights   02/13/24 1959  Weight: 75 kg    Examination:  General exam: Appears calm and comfortable, not in any acute distress. Respiratory system: Clear to auscultation. Respiratory effort normal.  RR 14 Cardiovascular system: S1 & S2 heard, RRR. No JVD, murmurs, rubs, gallops or clicks. Gastrointestinal system: Abdomen is non distended, soft and non tender.  Normal bowel sounds heard. Central nervous system: Alert and oriented x 3. No focal neurological deficits. Extremities: Edema+, No cyanosis, No clubbing  Skin: No rashes, lesions or ulcers Psychiatry: Hallucinations, delusions,  mood & affect appropriate.    Data Reviewed: I have personally reviewed following labs and imaging studies  CBC: Recent Labs  Lab 02/13/24 2022 02/14/24 0140  WBC 7.3 6.6  HGB 15.0 13.3  HCT 44.6 39.9  MCV 88.3 87.3  PLT 225 208   Basic Metabolic Panel: Recent Labs  Lab 02/13/24 2022 02/14/24 0140  NA 142 141  K 3.9 3.8  CL 106 107  CO2 24 25  GLUCOSE 107* 112*  BUN 17 17  CREATININE 1.30* 1.15  CALCIUM  8.8* 8.1*   GFR: Estimated Creatinine Clearance: 48.7 mL/min (by C-G formula  based on SCr of 1.15 mg/dL). Liver Function Tests: Recent Labs  Lab 02/14/24 0140  AST 22  ALT 8  ALKPHOS 116  BILITOT 0.8  PROT 6.6  ALBUMIN 3.2*   No results for input(s): LIPASE, AMYLASE in the last 168 hours. No results for input(s): AMMONIA in the last 168 hours. Coagulation Profile: No results for input(s): INR, PROTIME in the last 168 hours. Cardiac Enzymes: No results for input(s): CKTOTAL, CKMB, CKMBINDEX, TROPONINI in the last 168 hours. BNP (last 3 results) No results for input(s): PROBNP in the last 8760 hours. HbA1C: No results for input(s): HGBA1C in the last 72 hours. CBG: No results for input(s): GLUCAP in the last 168 hours. Lipid Profile: Recent Labs    02/14/24 0140  CHOL 121  HDL 43  LDLCALC 69  TRIG 46  CHOLHDL 2.8   Thyroid Function Tests: No results for input(s): TSH, T4TOTAL, FREET4, T3FREE, THYROIDAB in the last 72 hours. Anemia Panel: No results for input(s): VITAMINB12, FOLATE, FERRITIN, TIBC, IRON, RETICCTPCT in the last 72 hours. Sepsis Labs: No results for input(s): PROCALCITON, LATICACIDVEN in the last 168 hours.  Recent Results (from the past 240 hours)  Resp panel by RT-PCR (RSV,  Flu A&B, Covid) Anterior Nasal Swab     Status: None   Collection Time: 02/13/24 10:19 PM   Specimen: Anterior Nasal Swab  Result Value Ref Range Status   SARS Coronavirus 2 by RT PCR NEGATIVE NEGATIVE Final   Influenza A by PCR NEGATIVE NEGATIVE Final   Influenza B by PCR NEGATIVE NEGATIVE Final    Comment: (NOTE) The Xpert Xpress SARS-CoV-2/FLU/RSV plus assay is intended as an aid in the diagnosis of influenza from Nasopharyngeal swab specimens and should not be used as a sole basis for treatment. Nasal washings and aspirates are unacceptable for Xpert Xpress SARS-CoV-2/FLU/RSV testing.  Fact Sheet for Patients: bloggercourse.com  Fact Sheet for Healthcare  Providers: seriousbroker.it  This test is not yet approved or cleared by the [place]  FDA and has been authorized for detection and/or diagnosis of SARS-CoV-2 by FDA under an Emergency Use Authorization (EUA). This EUA will remain in effect (meaning this test can be used) for the duration of the COVID-19 declaration under Section 564(b)(1) of the Act, 21 U.S.C. section 360bbb-3(b)(1), unless the authorization is terminated or revoked.     Resp Syncytial Virus by PCR NEGATIVE NEGATIVE Final    Comment: (NOTE) Fact Sheet for Patients: bloggercourse.com  Fact Sheet for Healthcare Providers: seriousbroker.it  This test is not yet approved or cleared by the [place]  FDA and has been authorized for detection and/or diagnosis of SARS-CoV-2 by FDA under an Emergency Use Authorization (EUA). This EUA will remain in effect (meaning this test can be used) for the duration of the COVID-19 declaration under Section 564(b)(1) of the Act, 21 U.S.C. section 360bbb-3(b)(1), unless the authorization is terminated or revoked.  Performed at [facility], [address], [place], [place] [address]     Radiology Studies: DG Chest 2 View Result Date: 02/13/2024 EXAM: 2 VIEW(S) XRAY OF THE CHEST 02/13/2024 08:58:00 PM COMPARISON: 06/03/2023 CLINICAL HISTORY: chest tightness chest tightness chest tightness FINDINGS: LUNGS AND PLEURA: Low lung volumes. No frank interstitial edema. Trace bilateral pleural effusions. No pneumothorax. No focal pulmonary opacity. HEART AND MEDIASTINUM: Enlarged cardiomediastinal silhouette. BONES AND SOFT TISSUES: Moderate hiatal hernia. No acute osseous abnormality. IMPRESSION: 1. Cardiomegaly. No frank interstitial edema. 2. Trace bilateral pleural effusions. Electronically signed by: Pinkie Pebbles MD 02/13/2024 09:07 PM EST RP Workstation: [id number]   Scheduled Meds:  aspirin  EC   81 mg Oral Daily   carvedilol   6.25 mg Oral BID WC   enoxaparin  (LOVENOX ) injection  40 mg Subcutaneous Q24H   feeding supplement  237 mL Oral BID BM   multivitamin with minerals  1 tablet Oral Daily   pantoprazole   40 mg Oral Daily   sodium chloride  flush  3 mL Intravenous Q12H   sodium chloride  flush  3 mL Intravenous Q12H   tamsulosin   0.4 mg Oral Daily   thiamine   100 mg Oral Daily   Continuous Infusions:  sodium chloride      cefTRIAXone  (ROCEPHIN )  IV     dextrose  5% lactated ringers  75 mL/hr at 02/13/24 2324     LOS: 1 day    Time spent: 50 mins    Darcel Dawley, MD [facility]   If 7PM-7AM, please contact night-coverage  "

## 2024-02-14 NOTE — ED Notes (Signed)
 Dr. Leotis, IR PA here. I just attempted this esophagram. However, patient is belligerant, yelling and not able to follow commands. Esophagram was aborted. IR will re-attempt tomorrow.  - Message from IR sent to this RN

## 2024-02-14 NOTE — ED Notes (Signed)
 Attempted to administer medications again, pt states that he wants to discuss his medications with his doctor first because he has been on a fruit and vegetable vitamins. He could take his aspirin  because it is not that strong on him but he wants to talk to the doctor first.

## 2024-02-14 NOTE — Progress Notes (Signed)
 Notified Attending about patient wanting to leave AMA. Patient kept shouting to not touch him after staff suggesting him to get into a gown. Nurse tried to de escalate situation and offer the patient options but patient was not wanting to hear it. Security called at beside and attempted to call sister but no answer. Paged Attending and he stated he was competent and to let him leave AMA if he wanted to. Patient signed AMA form and took out PIV.

## 2024-02-14 NOTE — ED Notes (Signed)
 Pt requesting to speak with child psychotherapist. [phone] called and informed pt requesting a visit.

## 2024-02-14 NOTE — Consult Note (Addendum)
 "    Inpatient Consultation   Referring Provider:     Dr. Darcel Dawley Primary Care Physician:  [facility] Primary Gastroenterologist:     [facility]    Reason for Consultation:     Choking, chest pain         HPI  [name] is a 84 y.o. male with a past medical history noteworthy for HTN, HCV, schizophrenia, anxiety, depression, chronic dyspepsia admitted to the hospital with chest pain and near syncope.  GI is consulted for choking and chest pain.  Cardiology evaluation showed unremarkable EKG and normal troponins.  Chest x-ray with cardiomegaly, trace pleural effusion but no interstitial edema.  [name] is a challenging historian due to his underlying mental health issues.  In discussing his swallowing issues he describes intermittent choking -predominantly to solids and less so to liquids.  States he has been able to swallow pills when he needs to.  He thinks that his swallowing problems are related to his poor dentition.  Relates that he needs to obtain a new pair of dentures but has not been able to have adequate dental care.  Has never had a food impaction.  No underlying history of asthma or atopy.  No history of Candida esophagitis.  Not endorsing chest pain at the time of my examination today.  SLP examination completed today -patient recommended to consume regular texture foods with thin liquids -advised GI evaluation.  Chart review indicates a history of chronic dyspepsia.  Endorses intermittent nausea and vomiting but cannot reliably tell me about the frequency.  He denies prior peptic ulcer disease.  Denies taking NSAIDs.  No change in bowel movements-denies melena or hematochezia.  No symptoms of bowel obstruction.  Denies diarrhea or constipation.  No prior EGD reported by patient or found during chart review.  Records from 2014-2015 indicate there was consideration of prior EGD and colonoscopy but not completed due to patient's inability to  consent.  Patient tells me that he resides with his sister. Denies alcohol, tobacco  Past Medical History:  Diagnosis Date   Acute cystitis without hematuria 02/28/2023   AKI (acute kidney injury) 02/27/2023   AMS (altered mental status) 01/06/2023   Anemia 08/31/2012   Benign hypertrophy of prostate    Dysphagia 03/01/2023   Hepatitis C    Hypokalemia 02/06/2013   Schizophrenia (HCC)    Swelling of lower extremity 01/13/2023   Urinary tract infection    Urinary tract infection 06/13/2011   IMO SNOMED Dx Update Oct 2024     UTI (urinary tract infection) 06/13/2011   IMO SNOMED Dx Update Oct 2024      Past Surgical History:  Procedure Laterality Date   KNEE SURGERY     right    History reviewed. No pertinent family history.   Social History[1]  Prior to Admission medications  Medication Sig Start Date End Date Taking? Authorizing Provider  carvedilol  (COREG ) 3.125 MG tablet Take 1 tablet (3.125 mg total) by mouth 2 (two) times daily with a meal. Patient not taking: Reported on 02/14/2024 06/03/23   Armenta Canning, MD  feeding supplement (ENSURE ENLIVE / ENSURE PLUS) LIQD Take 237 mLs by mouth 2 (two) times daily between meals. Patient not taking: Reported on 02/14/2024 03/09/23   Gomes, Adriana, DO  finasteride (PROSCAR) 5 MG tablet Take 5 mg by mouth at bedtime. Patient not taking: Reported on 02/14/2024    [provider]  melatonin 3 MG TABS tablet Take by mouth at bedtime  as needed (sleep). Patient not taking: Reported on 02/14/2024 05/31/23   [provider]  Multiple Vitamin (MULTIVITAMIN WITH MINERALS) TABS tablet Take 1 tablet by mouth daily. Patient not taking: Reported on 02/14/2024 03/21/23   Jadapalle, Sree, MD  risperidone  (RISPERDAL ) 4 MG tablet Take 2 mg by mouth at bedtime. Patient not taking: Reported on 02/14/2024 05/25/23   [provider]  senna (SENOKOT) 8.6 MG tablet Take 2 tablets by mouth 2 (two) times daily. Patient not taking:  Reported on 02/14/2024 05/25/23   [provider]  tamsulosin  (FLOMAX ) 0.4 MG CAPS capsule Take 1 capsule (0.4 mg total) by mouth daily. Patient not taking: Reported on 02/14/2024 03/21/23   Jadapalle, Sree, MD  thiamine  (VITAMIN B-1) 100 MG tablet Take 1 tablet (100 mg total) by mouth daily. Patient not taking: Reported on 02/14/2024 03/21/23   Jadapalle, Sree, MD    Current Facility-Administered Medications  Medication Dose Route Frequency Provider Last Rate Last Admin   0.9 %  sodium chloride  infusion  250 mL Intravenous PRN Sundil, Subrina, MD       acetaminophen  (TYLENOL ) tablet 650 mg  650 mg Oral Q6H PRN Sundil, Subrina, MD       Or   acetaminophen  (TYLENOL ) suppository 650 mg  650 mg Rectal Q6H PRN Sundil, Subrina, MD       alum & mag hydroxide-simeth (MAALOX/MYLANTA) 200-200-20 MG/5ML suspension 30 mL  30 mL Oral Q6H PRN Sundil, Subrina, MD       aspirin  EC tablet 81 mg  81 mg Oral Daily Sundil, Subrina, MD       carvedilol  (COREG ) tablet 6.25 mg  6.25 mg Oral BID WC Sundil, Subrina, MD       cefTRIAXone  (ROCEPHIN ) 1 g in sodium chloride  0.9 % 100 mL IVPB  1 g Intravenous Q24H Leotis Bogus, MD   Stopped at 02/14/24 1143   dextrose  5 % in lactated ringers  infusion   Intravenous Continuous Sundil, Subrina, MD 75 mL/hr at 02/14/24 1402 Rate Verify at 02/14/24 1402   enoxaparin  (LOVENOX ) injection 40 mg  40 mg Subcutaneous Q24H Sundil, Subrina, MD       feeding supplement (ENSURE ENLIVE / ENSURE PLUS) liquid 237 mL  237 mL Oral BID BM Sundil, Subrina, MD       multivitamin with minerals tablet 1 tablet  1 tablet Oral Daily Sundil, Subrina, MD       ondansetron  (ZOFRAN ) tablet 4 mg  4 mg Oral Q6H PRN Sundil, Subrina, MD       Or   ondansetron  (ZOFRAN ) injection 4 mg  4 mg Intravenous Q6H PRN Sundil, Subrina, MD       pantoprazole  (PROTONIX ) EC tablet 40 mg  40 mg Oral Daily Sundil, Subrina, MD   40 mg at 02/13/24 2322   sodium chloride  flush (NS) 0.9 % injection 3 mL  3 mL  Intravenous Q12H Sundil, Subrina, MD   3 mL at 02/14/24 1117   sodium chloride  flush (NS) 0.9 % injection 3 mL  3 mL Intravenous Q12H Sundil, Subrina, MD   3 mL at 02/14/24 1116   sodium chloride  flush (NS) 0.9 % injection 3 mL  3 mL Intravenous PRN Sundil, Subrina, MD       tamsulosin  (FLOMAX ) capsule 0.4 mg  0.4 mg Oral Daily Sundil, Subrina, MD       thiamine  (VITAMIN B1) tablet 100 mg  100 mg Oral Daily Sundil, Subrina, MD       Current Outpatient Medications  Medication Sig  Dispense Refill   carvedilol  (COREG ) 3.125 MG tablet Take 1 tablet (3.125 mg total) by mouth 2 (two) times daily with a meal. (Patient not taking: Reported on 02/14/2024) 60 tablet 0   feeding supplement (ENSURE ENLIVE / ENSURE PLUS) LIQD Take 237 mLs by mouth 2 (two) times daily between meals. (Patient not taking: Reported on 02/14/2024)     finasteride (PROSCAR) 5 MG tablet Take 5 mg by mouth at bedtime. (Patient not taking: Reported on 02/14/2024)     melatonin 3 MG TABS tablet Take by mouth at bedtime as needed (sleep). (Patient not taking: Reported on 02/14/2024)     Multiple Vitamin (MULTIVITAMIN WITH MINERALS) TABS tablet Take 1 tablet by mouth daily. (Patient not taking: Reported on 02/14/2024) 30 tablet 0   risperidone  (RISPERDAL ) 4 MG tablet Take 2 mg by mouth at bedtime. (Patient not taking: Reported on 02/14/2024)     senna (SENOKOT) 8.6 MG tablet Take 2 tablets by mouth 2 (two) times daily. (Patient not taking: Reported on 02/14/2024)     tamsulosin  (FLOMAX ) 0.4 MG CAPS capsule Take 1 capsule (0.4 mg total) by mouth daily. (Patient not taking: Reported on 02/14/2024) 30 capsule 4   thiamine  (VITAMIN B-1) 100 MG tablet Take 1 tablet (100 mg total) by mouth daily. (Patient not taking: Reported on 02/14/2024) 30 tablet 0    Allergies as of 02/13/2024   (No Known Allergies)    GI Review of Symptoms Significant for chest pain, choking. Otherwise negative.  General Review of Systems  Review of systems is significant  for the pertinent positives and negatives as listed per the HPI.  Full ROS is otherwise negative.    Physical Exam  Vital signs in last 24 hours: Temp:  [97.8 F (36.6 C)-98.5 F (36.9 C)] 97.8 F (36.6 C) (01/20 1147) Pulse Rate:  [38-104] 89 (01/20 1215) Resp:  [14-25] 17 (01/20 1215) BP: (104-159)/(61-96) 122/78 (01/20 1215) SpO2:  [95 %-100 %] 100 % (01/20 1215) Weight:  [75 kg] 75 kg (01/19 1959)   General: Alert, sitting on hospital gurney, hard of hearing Head:  Normocephalic and atraumatic. Eyes:   No scleral icterus Throat: Poor dentition with missing teeth, no obvious thrush in mouth or tongue Lungs: CTA bilaterally Heart: Normal S1, S2. No MRG. Regular rate and rhythm. No peripheral edema, cyanosis or pallor.  Abdomen:  Soft, nondistended, nontender. No rebound or guarding. Normal bowel sounds. No appreciable masses or hepatomegaly. Rectal: Deferred Psychiatric: Tangential thoughts   Lab Results Recent Labs    02/13/24 2022 02/14/24 0140  WBC 7.3 6.6  HGB 15.0 13.3  HCT 44.6 39.9  PLT 225 208   BMET Recent Labs    02/13/24 2022 02/14/24 0140  NA 142 141  K 3.9 3.8  CL 106 107  CO2 24 25  GLUCOSE 107* 112*  BUN 17 17  CREATININE 1.30* 1.15  CALCIUM  8.8* 8.1*   LFT Recent Labs    02/14/24 0140  PROT 6.6  ALBUMIN 3.2*  AST 22  ALT 8  ALKPHOS 116  BILITOT 0.8   PT/INR No results for input(s): LABPROT, INR in the last 72 hours.  Radiographic Studies DG Chest 2 View Result Date: 02/13/2024 EXAM: 2 VIEW(S) XRAY OF THE CHEST 02/13/2024 08:58:00 PM COMPARISON: 06/03/2023 CLINICAL HISTORY: chest tightness chest tightness chest tightness FINDINGS: LUNGS AND PLEURA: Low lung volumes. No frank interstitial edema. Trace bilateral pleural effusions. No pneumothorax. No focal pulmonary opacity. HEART AND MEDIASTINUM: Enlarged cardiomediastinal silhouette. BONES AND SOFT TISSUES: Moderate  hiatal hernia. No acute osseous abnormality. IMPRESSION: 1.  Cardiomegaly. No frank interstitial edema. 2. Trace bilateral pleural effusions. Electronically signed by: Pinkie Pebbles MD 02/13/2024 09:07 PM EST RP Workstation: [id number]    Endoscopic Studies     None   Clinical Impression   84 year old gentleman with a past medical history noteworthy for HTN, HCV, schizophrenia, anxiety, depression, chronic dyspepsia admitted to the hospital with chest pain and near syncope.  GI is consulted for choking and chest pain.  [name] is a challenging historian due to underlying mental health issues.  He endorses intermittent choking predominantly to solids and less so liquids.  Reports he is able to swallow medications.  He relates that he feels his choking issues are due to his poor dentition and inability to adequately masticate and swallow his food.  Differential diagnosis for his symptoms may include oropharyngeal dysphagia, esophageal stricture/stenosis, esophageal web, esophagitis -reflux versus Candida, eosinophilic esophagitis, extrinsic compression of esophagus, esophageal motility disorder/spasm.  Has a history of chronic dyspepsia with intermittent nausea and vomiting but is not clear about frequency of the symptoms.  Differential diagnosis may include gastritis, H. pylori infection, peptic ulcer disease, duodenitis.  No signs or symptoms to suggest bowel obstruction.   Plan  For further investigation of choking, chest pain and swallowing difficulties I have recommended a barium esophagram for initial evaluation H. pylori stool antigen ordered Protonix  40 mg IV daily Pending results of barium esophagram can consider possibility of Carafate 1 g p.o. 4 times daily Pending results of barium esophagram can consider possibility of EGD, however, patient's current mental status precludes his ability to consent for the procedure.  If EGD is considered would need to identify DPOA and ensure patient was at least willing to proceed even if he cannot consent  for the procedure. If patient is having symptoms of vomiting can provide antiemetics in the form of Zofran  or Phenergan.  Thank you for your kind consultation, we will continue to follow.  Inocente HERO Donne Baley  02/14/2024, 2:46 PM  Inocente Hausen, MD [facility]      [1]  Social History Tobacco Use   Smoking status: Former    Current packs/day: 0.00    Average packs/day: 1 pack/day for 20.0 years (20.0 ttl pk-yrs)    Types: Cigarettes    Start date: 06/11/1955    Quit date: 06/11/1975    Years since quitting: 48.7   Smokeless tobacco: Never  Vaping Use   Vaping status: Never Used  Substance Use Topics   Alcohol use: No   Drug use: No   "

## 2024-02-14 NOTE — Progress Notes (Addendum)
 CSW met w/ pt at bedside. Pt reports inability to access Social Security benefits, stating his sister is required to accompany him to DSS and is refusing to do so. Pt reports having multiple uncashed checks that he is unable to deposit without sister present. Pt states sister has informed him she will no longer assist him. CSW attempted to contact pt sister multiple times without success; HIPAA-compliant voicemail left requesting return call. Due to concerns regarding pts inability to access personal funds and lack of support, CSW completed an APS report as a supportive measure to assist pt in accessing financial resources   Update: CSW received callback from pt sister. Sister reports pt lives at home w/ her however she was unaware pt was in the hospital and thought he was still in the home w/ room door closed. Sister reports pt needs to get a state ID in order to get checks cashed. Sister states that she will be providing pt transportation at dc.

## 2024-02-14 NOTE — ED Notes (Signed)
 Pt calm and cooperative at this time. 2C called to inform the pt is being transferred.

## 2024-02-14 NOTE — ED Notes (Signed)
 Patient transported to X-ray

## 2024-02-14 NOTE — ED Notes (Signed)
 CCMD called to add pt back to cardiac monitoring.

## 2024-02-14 NOTE — Progress Notes (Signed)
"  °  Progress Note  Patient Name: [name] Date of Encounter: 02/14/2024 [facility] Cardiologist: None   Interval Summary   Agitated and speaking nonsensically today  Denies chest pain  Vital Signs Vitals:   02/14/24 0630 02/14/24 0635 02/14/24 0756 02/14/24 0845  BP: 127/84   (!) 128/91  Pulse: 60   70  Resp: 20   19  Temp:  98 F (36.7 C)    TempSrc:  Oral    SpO2: 100%  100% 100%  Weight:      Height:       No intake or output data in the 24 hours ending 02/14/24 0910    02/13/2024    7:59 PM 06/03/2023    5:56 PM 03/10/2023    5:54 PM  Last 3 Weights  Weight (lbs) 165 lb 5.5 oz 165 lb 5.5 oz 166 lb 8 oz  Weight (kg) 75 kg 75 kg 75.524 kg      Telemetry/ECG  SR occ PVCs - Personally Reviewed  Physical Exam  GEN: Agitated Neck: No JVD Cardiac: RRR, no murmurs, rubs, or gallops.  Respiratory: Clear to auscultation bilaterally. GI: Soft, nontender, non-distended  MS: No edema  Assessment & Plan  Chest pain Trops mildly elevated Agree with CTA, TTE to be done today High threshold for coronary angiography in this patient Continue ASA 81mg  Dyspepsia Continue PPI Agitatation Per primary team    For questions or updates, please contact [facility] Please consult [email or web address] for contact info under         Signed, Vallie Teters K Koren Plyler, MD    "

## 2024-02-14 NOTE — ED Notes (Addendum)
 Coughing heard at the nurse station, this RN went in to check on the pt. Pt at the end of the bed coughing, he states that he has a choking feeling but it will pass. Pt requests a urinal, this RN left out for privacy. This RN returned to check on the pt, he was still coughing. Pt actively vomiting, vomit bag given. O2 100%.  Speech therapist and Leotis, MD notified.

## 2024-02-14 NOTE — Evaluation (Signed)
 Clinical/Bedside Swallow Evaluation Patient Details  Name: [name] MRN: [id number] Date of Birth: 1940-03-24  Today's Date: 02/14/2024 Time: SLP Start Time (ACUTE ONLY): 9177 SLP Stop Time (ACUTE ONLY): 0834 SLP Time Calculation (min) (ACUTE ONLY): 12 min  Past Medical History:  Past Medical History:  Diagnosis Date   Acute cystitis without hematuria 02/28/2023   AKI (acute kidney injury) 02/27/2023   AMS (altered mental status) 01/06/2023   Anemia 08/31/2012   Benign hypertrophy of prostate    Dysphagia 03/01/2023   Hepatitis C    Hypokalemia 02/06/2013   Schizophrenia (HCC)    Swelling of lower extremity 01/13/2023   Urinary tract infection    Urinary tract infection 06/13/2011   IMO SNOMED Dx Update Oct 2024     UTI (urinary tract infection) 06/13/2011   IMO SNOMED Dx Update Oct 2024     Past Surgical History:  Past Surgical History:  Procedure Laterality Date   KNEE SURGERY     right   HPI:  [name] is a 84 y.o. male who presented to emergency department complaining of chest pain episode and near syncope. Patient reports he has not felt well all day today.  He reports multiple episodes of chest pain which he describes as making him feel like he is choking, then will get very short of breath and feels like he is going to pass out.  He states he has not lost consciousness as he does a lot of slow deep breaths which seems to help. CXR 1/19: Low lung volumes. No frank interstitial edema. Trace bilateral pleural  effusions. No pneumothorax. No focal pulmonary opacity.  Seen clinically by ST Feb 2025 with recs for reg thin and possible esophageal component. Pt with medical history significant of essential hypertension, chronic dyspepsia, chronic hepatitis C, schizophrenia, anxiety, depression, hydrocele.    Assessment / Plan / Recommendation  Clinical Impression  Pt presents with functionall swallowing as assessed clinically.  He tolerated all consistencies trialed  without any clinical s/s of aspiration.  He exhibited excellent oral clearance of solids.  Pt's reporting of his swallowing difficulties is inconsistent, and he reported some recent incidents which sound hallucinatory.  He states his swallow difficulties have been going on for years, after telling hospitalist overnight that he has not had prior episodes.  Pt describes episodes as choking, but endorses sticking sensation after confirming that he can breathe continuously during these episodes, he notes it occurs with food more than liquid, he sometimes bring things back up.  His clinical presentation and description of symptoms suggest an esophageal dysphagia.  Pt seen by ST in Feb 2025 describing regurgitation and esophageal component to swallowing difficulties was questioned at that time as well.  Consider further esophageal assessment and/or GI consult. Pt has no further ST needs. SLP will sign off.    Recommend regular texture diet with thin liquids, with pt choosing foods that are easier for him to swallow as needed.   SLP Visit Diagnosis: Dysphagia, pharyngoesophageal phase (R13.14)    Aspiration Risk  No limitations    Diet Recommendation Regular;Thin liquid    Liquid Administration via: Cup;Straw Medication Administration:  (As tolerated) Supervision: Patient able to self feed Compensations: Slow rate;Small sips/bites Postural Changes: Seated upright at 90 degrees    Other Recommendations Recommended Consults: Consider GI evaluation;Consider esophageal assessment Oral Care Recommendations: Oral care BID     Swallow Evaluation Recommendations  See above   Assistance Recommended at Discharge  N/A  Functional Status Assessment  Patient has not had a recent decline in their functional status  Frequency and Duration  (N/A)          Prognosis Prognosis for improved oropharyngeal function:  (N/A)      Swallow Study   General Date of Onset: 02/14/24 HPI: [name] is a 84 y.o.  male who presented to emergency department complaining of chest pain episode and near syncope. Patient reports he has not felt well all day today.  He reports multiple episodes of chest pain which he describes as making him feel like he is choking, then will get very short of breath and feels like he is going to pass out.  He states he has not lost consciousness as he does a lot of slow deep breaths which seems to help. CXR 1/19: Low lung volumes. No frank interstitial edema. Trace bilateral pleural  effusions. No pneumothorax. No focal pulmonary opacity.  Seen clinically by ST Feb 2025 with recs for reg thin and possible esophageal component. Pt with medical history significant of essential hypertension, chronic dyspepsia, chronic hepatitis C, schizophrenia, anxiety, depression, hydrocele. Type of Study: Bedside Swallow Evaluation Previous Swallow Assessment: CSE 03/01/23, reg/thin Diet Prior to this Study: Regular;Thin liquids (Level 0) Temperature Spikes Noted: No Respiratory Status: Room air History of Recent Intubation: No Behavior/Cognition: Alert;Cooperative;Distractible Oral Cavity Assessment: Within Functional Limits Oral Care Completed by SLP: No Oral Cavity - Dentition: Missing dentition;Adequate natural dentition Vision: Functional for self-feeding Self-Feeding Abilities: Able to feed self Patient Positioning: Upright in bed Baseline Vocal Quality: Normal Volitional Cough: Strong Volitional Swallow: Able to elicit    Oral/Motor/Sensory Function Overall Oral Motor/Sensory Function: Within functional limits   Ice Chips Ice chips: Not tested   Thin Liquid Thin Liquid: Within functional limits Presentation: Straw    Nectar Thick Nectar Thick Liquid: Not tested   Honey Thick Honey Thick Liquid: Not tested   Puree Puree: Within functional limits Presentation: Spoon   Solid     Solid: Within functional limits Presentation: Self Fed      Anette FORBES Grippe, MA, CCC-SLP Acute  Rehabilitation Services Office: [phone] 02/14/2024,8:53 AM

## 2024-02-14 NOTE — ED Notes (Addendum)
 Pt refusing medications at this time. States that he has been taking all natural/raw vitamins and vegetables, and he just completed a 5 day fast. Pt raising his voice saying we are not telling him why he is seeing money coming out of the bus floor and why he is seeing black people coming out of the walls. He requests a child psychotherapist.  Leotis, MD notified. Pt also refusing the staff to clean him up.

## 2024-02-14 NOTE — Progress Notes (Addendum)
 CSW received call from Anthon with APS confirming report was screened in

## 2024-02-14 NOTE — Consult Note (Signed)
 "   Cardiology Consultation   Patient ID: ARYAV WIMBERLY MRN: [id number]; DOB: 1941-01-03  Admit date: 02/13/2024 Date of Consult: 02/14/2024  PCP:  [facility] Providers Cardiologist:  None        Patient Profile:   [name] is a 84 y.o. male with a hx of HTN, chronic dyspepsia, chronic hepatitis C, schizophrenia, anxiety, depression, hydrocele who is being seen 02/14/2024 for the evaluation of stress testing at the request of Dr. Sundil.  History of Present Illness:   [name] is hard of hearing and slightly tangential, making history gathering difficult. Reports that he was having dinner, choked on something, and reports chest pain and leg pain, then leg gave out. No LOC, but he endorsed presyncope. Too lots of slow breaths which helped him recover. Presented to the ED for evaluation. Not currently complaining of chest pain or SOB. No N/V, abdominal pain, palpitations, fevers, chills, or recent sick contacts.  In the ED, was initially hypertensive, now improved without intervention. Cr initially 1.3, now improved to 1.1 with some D5LR maintenance fluids. Because of the chest pain complaint, he was loaded with aspirin  325mg  PO. His hstrop T resulted 31 -> 26 (previously hstrop I have been in the 3-4's).    Past Medical History:  Diagnosis Date   Acute cystitis without hematuria 02/28/2023   AKI (acute kidney injury) 02/27/2023   AMS (altered mental status) 01/06/2023   Anemia 08/31/2012   Benign hypertrophy of prostate    Dysphagia 03/01/2023   Hepatitis C    Hypokalemia 02/06/2013   Schizophrenia (HCC)    Swelling of lower extremity 01/13/2023   Urinary tract infection    Urinary tract infection 06/13/2011   IMO SNOMED Dx Update Oct 2024     UTI (urinary tract infection) 06/13/2011   IMO SNOMED Dx Update Oct 2024      Past Surgical History:  Procedure Laterality Date   KNEE SURGERY     right     Home Medications:  Prior to  Admission medications  Medication Sig Start Date End Date Taking? Authorizing Provider  carvedilol  (COREG ) 3.125 MG tablet Take 1 tablet (3.125 mg total) by mouth 2 (two) times daily with a meal. Patient not taking: Reported on 02/14/2024 06/03/23   Armenta Canning, MD  feeding supplement (ENSURE ENLIVE / ENSURE PLUS) LIQD Take 237 mLs by mouth 2 (two) times daily between meals. Patient not taking: Reported on 02/14/2024 03/09/23   Gomes, Adriana, DO  finasteride (PROSCAR) 5 MG tablet Take 5 mg by mouth at bedtime. Patient not taking: Reported on 02/14/2024    [provider]  melatonin 3 MG TABS tablet Take by mouth at bedtime as needed (sleep). Patient not taking: Reported on 02/14/2024 05/31/23   [provider]  Multiple Vitamin (MULTIVITAMIN WITH MINERALS) TABS tablet Take 1 tablet by mouth daily. Patient not taking: Reported on 02/14/2024 03/21/23   Jadapalle, Sree, MD  risperidone  (RISPERDAL ) 4 MG tablet Take 2 mg by mouth at bedtime. Patient not taking: Reported on 02/14/2024 05/25/23   [provider]  senna (SENOKOT) 8.6 MG tablet Take 2 tablets by mouth 2 (two) times daily. Patient not taking: Reported on 02/14/2024 05/25/23   [provider]  tamsulosin  (FLOMAX ) 0.4 MG CAPS capsule Take 1 capsule (0.4 mg total) by mouth daily. Patient not taking: Reported on 02/14/2024 03/21/23   Jadapalle, Sree, MD  thiamine  (VITAMIN B-1) 100 MG tablet Take 1 tablet (100 mg  total) by mouth daily. Patient not taking: Reported on 02/14/2024 03/21/23   Jadapalle, Sree, MD    Inpatient Medications: Scheduled Meds:  aspirin  EC  81 mg Oral Daily   carvedilol   6.25 mg Oral BID WC   enoxaparin  (LOVENOX ) injection  40 mg Subcutaneous Q24H   feeding supplement  237 mL Oral BID BM   multivitamin with minerals  1 tablet Oral Daily   pantoprazole   40 mg Oral Daily   sodium chloride  flush  3 mL Intravenous Q12H   sodium chloride  flush  3 mL Intravenous Q12H   tamsulosin   0.4 mg Oral  Daily   thiamine   100 mg Oral Daily   Continuous Infusions:  sodium chloride      dextrose  5% lactated ringers  75 mL/hr at 02/13/24 2324   PRN Meds: sodium chloride , acetaminophen  **OR** acetaminophen , alum & mag hydroxide-simeth, ondansetron  **OR** ondansetron  (ZOFRAN ) IV, sodium chloride  flush  Allergies:   Allergies[1]  Social History:   Social History   Socioeconomic History   Marital status: Single    Spouse name: Not on file   Number of children: Not on file   Years of education: Not on file   Highest education level: Not on file  Occupational History   Not on file  Tobacco Use   Smoking status: Former    Current packs/day: 0.00    Average packs/day: 1 pack/day for 20.0 years (20.0 ttl pk-yrs)    Types: Cigarettes    Start date: 06/11/1955    Quit date: 06/11/1975    Years since quitting: 48.7   Smokeless tobacco: Never  Vaping Use   Vaping status: Never Used  Substance and Sexual Activity   Alcohol use: No   Drug use: No   Sexual activity: Not on file  Other Topics Concern   Not on file  Social History Narrative   Not on file   Social Drivers of Health   Tobacco Use: Medium Risk (02/13/2024)   Patient History    Smoking Tobacco Use: Former    Smokeless Tobacco Use: Never    Passive Exposure: Not on Actuary Strain: Not on file  Food Insecurity: Patient Declined (03/10/2023)   Hunger Vital Sign    Worried About Running Out of Food in the Last Year: Patient declined    Ran Out of Food in the Last Year: Patient declined  Recent Concern: Food Insecurity - Food Insecurity Present (02/28/2023)   Hunger Vital Sign    Worried About Running Out of Food in the Last Year: Often true    Ran Out of Food in the Last Year: Often true  Transportation Needs: Patient Declined (03/10/2023)   PRAPARE - Administrator, Civil Service (Medical): Patient declined    Lack of Transportation (Non-Medical): Patient declined  Physical Activity: Not on file   Stress: Not on file  Social Connections: Patient Declined (03/10/2023)   Social Connection and Isolation Panel    Frequency of Communication with Friends and Family: Patient declined    Frequency of Social Gatherings with Friends and Family: Patient declined    Attends Religious Services: Patient declined    Database Administrator or Organizations: Patient declined    Attends Banker Meetings: Patient declined    Marital Status: Patient declined  Intimate Partner Violence: Patient Declined (03/10/2023)   Humiliation, Afraid, Rape, and Kick questionnaire    Fear of Current or Ex-Partner: Patient declined    Emotionally Abused: Patient declined  Physically Abused: Patient declined    Sexually Abused: Patient declined  Depression (EYV7-0): Not on file  Alcohol Screen: Low Risk (03/10/2023)   Alcohol Screen    Last Alcohol Screening Score (AUDIT): 0  Housing: Patient Declined (03/10/2023)   Housing Stability Vital Sign    Unable to Pay for Housing in the Last Year: Patient declined    Number of Times Moved in the Last Year: 0    Homeless in the Last Year: Patient declined  Recent Concern: Housing - High Risk (02/28/2023)   Housing Stability Vital Sign    Unable to Pay for Housing in the Last Year: No    Number of Times Moved in the Last Year: 0    Homeless in the Last Year: Yes  Utilities: Patient Declined (03/10/2023)   [facility] Utilities    Threatened with loss of utilities: Patient declined  Health Literacy: Not on file    Family History:   History reviewed. No pertinent family history.   ROS:  Please see the history of present illness.  All other ROS reviewed and negative.     Physical Exam/Data:   Vitals:   02/14/24 0245 02/14/24 0315 02/14/24 0330 02/14/24 0415  BP:  118/72 114/71 113/75  Pulse:  72 (!) 38 67  Resp: 19   16  Temp:      TempSrc:      SpO2:  100% 98% 100%  Weight:      Height:       No intake or output data in the 24 hours ending 02/14/24  0455    02/13/2024    7:59 PM Jun 25, 2023    5:56 PM 03/10/2023    5:54 PM  Last 3 Weights  Weight (lbs) 165 lb 5.5 oz 165 lb 5.5 oz 166 lb 8 oz  Weight (kg) 75 kg 75 kg 75.524 kg     Body mass index is 25.14 kg/m.  General:  lying in bed in NAD HEENT: normal Neck: no JVD Cardiac:  normal S1, S2; RRR; systolic murmur present Lungs:  clear to auscultation bilaterally, no wheezing, rhonchi or rales  Abd: soft, nontender, no hepatomegaly  Ext: no edema Musculoskeletal:  No deformities, BUE and BLE strength normal and equal Skin: warm and dry   EKG:  The EKG was personally reviewed and demonstrates: NSR, isolated 1mm ST elevation in V2 (more pronounced than in his Jun 25, 2023 EKG) Telemetry:  Telemetry was personally reviewed and demonstrates: NSR  Relevant CV Studies:  TTE 02/28/23: 1. Left ventricular ejection fraction, by estimation, is 60 to 65%. The  left ventricle has normal function. The left ventricle has no regional  wall motion abnormalities. There is moderate left ventricular hypertrophy.  Left ventricular diastolic  parameters are indeterminate.   2. Right ventricular systolic function is normal. The right ventricular  size is normal. There is normal pulmonary artery systolic pressure.   3. Left atrial size was mildly dilated.   4. Right atrial size was mildly dilated.   5. The mitral valve is normal in structure. Mild mitral valve  regurgitation.   6. The aortic valve is tricuspid. There is moderate calcification of the  aortic valve. There is moderate thickening of the aortic valve. Aortic  valve regurgitation is mild. Mild aortic valve stenosis.   7. The inferior vena cava is dilated in size with <50% respiratory  variability, suggesting right atrial pressure of 15 mmHg.   Laboratory Data:  High Sensitivity Troponin:  No results for input(s): TROPONINIHS in the  last 720 hours.   Chemistry Recent Labs  Lab 02/13/24 2022 02/14/24 0140  NA 142 141  K 3.9 3.8  CL  106 107  CO2 24 25  GLUCOSE 107* 112*  BUN 17 17  CREATININE 1.30* 1.15  CALCIUM  8.8* 8.1*  GFRNONAA 55* >60  ANIONGAP 12 9    Recent Labs  Lab 02/14/24 0140  PROT 6.6  ALBUMIN 3.2*  AST 22  ALT 8  ALKPHOS 116  BILITOT 0.8   Lipids  Recent Labs  Lab 02/14/24 0140  CHOL 121  TRIG 46  HDL 43  LDLCALC 69  CHOLHDL 2.8    Hematology Recent Labs  Lab 02/13/24 2022 02/14/24 0140  WBC 7.3 6.6  RBC 5.05 4.57  HGB 15.0 13.3  HCT 44.6 39.9  MCV 88.3 87.3  MCH 29.7 29.1  MCHC 33.6 33.3  RDW 14.6 14.6  PLT 225 208   Thyroid No results for input(s): TSH, FREET4 in the last 168 hours.  BNPNo results for input(s): BNP, PROBNP in the last 168 hours.  DDimer No results for input(s): DDIMER in the last 168 hours.   Radiology/Studies:  DG Chest 2 View Result Date: 02/13/2024 EXAM: 2 VIEW(S) XRAY OF THE CHEST 02/13/2024 08:58:00 PM COMPARISON: 06/03/2023 CLINICAL HISTORY: chest tightness chest tightness chest tightness FINDINGS: LUNGS AND PLEURA: Low lung volumes. No frank interstitial edema. Trace bilateral pleural effusions. No pneumothorax. No focal pulmonary opacity. HEART AND MEDIASTINUM: Enlarged cardiomediastinal silhouette. BONES AND SOFT TISSUES: Moderate hiatal hernia. No acute osseous abnormality. IMPRESSION: 1. Cardiomegaly. No frank interstitial edema. 2. Trace bilateral pleural effusions. Electronically signed by: Pinkie Pebbles MD 02/13/2024 09:07 PM EST RP Workstation: [id number]     Assessment and Plan:   84 y.o. male with a hx of HTN, chronic dyspepsia, chronic hepatitis C, schizophrenia, anxiety, depression, hydrocele who is being seen 02/14/2024 for the evaluation of stress testing at the request of Dr. Sundil.  Chest pain Myocardial injury Difficult to get much a history from the patient, but as best as I can tell, sounds like possibly having dinner, choked on food with ?esophageal spasm, leading to chest pain. His EKG does so an isolated 1mm ST  elevation in V2 however does not meet STEMI criteria; importantly, he is now asymptomatic and his hstrop T are flat at 31 to 26 after 2 hours. Review of his prior EKGs from 05/2023 also show mild ST elevation in V2 so low suspicion that this is a new finding. It is difficult to fully characterize his exercise capacity, but he does walk at home without anginal symptoms. Denies stairs or yardwork to assess for angina with increased METS. Given positive troponins, will hold off on inpatient stress testing for now. Instead, will plan for coronary CTA and repeat TTE to assess for coronary plaque and any functional WMA that may raise concerns for underlying obstructive CAD. LDL 69.  - plan for coronary CTA in the morning - repeat TTE in the morning to assess for new WMA - s/p ASA 325 in the ED, continue home ASA 81mg  every day - continue home coreg    Risk Assessment/Risk Scores:     TIMI Risk Score for Unstable Angina or Non-ST Elevation MI:   The patient's TIMI risk score is 3, which indicates a 13% risk of all cause mortality, new or recurrent myocardial infarction or need for urgent revascularization in the next 14 days.  For questions or updates, please contact [facility] Please consult [email or web address] for contact info under  Signed, Curtistine LITTIE Farr, MD  02/14/2024 4:55 AM     [1] No Known Allergies  "

## 2024-02-15 LAB — SYPHILIS: RPR W/REFLEX TO RPR TITER AND TREPONEMAL ANTIBODIES, TRADITIONAL SCREENING AND DIAGNOSIS ALGORITHM: RPR Ser Ql: NONREACTIVE

## 2024-02-15 LAB — LIPOPROTEIN A (LPA): Lipoprotein (a): 71.6 nmol/L — ABNORMAL HIGH

## 2024-02-16 LAB — GC/CHLAMYDIA PROBE AMP (~~LOC~~) NOT AT ARMC
Chlamydia: NEGATIVE
Comment: NEGATIVE
Comment: NORMAL
Neisseria Gonorrhea: NEGATIVE
